# Patient Record
Sex: Female | Born: 1980 | Race: White | Hispanic: No | Marital: Single | Smoking: Never smoker
Health system: Southern US, Community
[De-identification: ages and names within clinical notes are randomized; demographics above are authoritative.]

## PROBLEM LIST (undated history)

## (undated) DIAGNOSIS — E785 Hyperlipidemia, unspecified: Secondary | ICD-10-CM

## (undated) DIAGNOSIS — G039 Meningitis, unspecified: Secondary | ICD-10-CM

## (undated) DIAGNOSIS — R6252 Short stature (child): Secondary | ICD-10-CM

## (undated) DIAGNOSIS — J302 Other seasonal allergic rhinitis: Secondary | ICD-10-CM

## (undated) DIAGNOSIS — M419 Scoliosis, unspecified: Secondary | ICD-10-CM

## (undated) DIAGNOSIS — F411 Generalized anxiety disorder: Secondary | ICD-10-CM

## (undated) DIAGNOSIS — N910 Primary amenorrhea: Secondary | ICD-10-CM

## (undated) DIAGNOSIS — E039 Hypothyroidism, unspecified: Secondary | ICD-10-CM

## (undated) DIAGNOSIS — I639 Cerebral infarction, unspecified: Secondary | ICD-10-CM

## (undated) DIAGNOSIS — Q2381 Bicuspid aortic valve: Secondary | ICD-10-CM

## (undated) DIAGNOSIS — T8859XA Other complications of anesthesia, initial encounter: Secondary | ICD-10-CM

## (undated) DIAGNOSIS — Z9889 Other specified postprocedural states: Secondary | ICD-10-CM

## (undated) DIAGNOSIS — Q969 Turner's syndrome, unspecified: Secondary | ICD-10-CM

## (undated) DIAGNOSIS — R112 Nausea with vomiting, unspecified: Secondary | ICD-10-CM

## (undated) DIAGNOSIS — I10 Essential (primary) hypertension: Secondary | ICD-10-CM

## (undated) DIAGNOSIS — T4145XA Adverse effect of unspecified anesthetic, initial encounter: Secondary | ICD-10-CM

## (undated) DIAGNOSIS — Q231 Congenital insufficiency of aortic valve: Secondary | ICD-10-CM

## (undated) HISTORY — DX: Hypothyroidism, unspecified: E03.9

## (undated) HISTORY — DX: Generalized anxiety disorder: F41.1

## (undated) HISTORY — DX: Bicuspid aortic valve: Q23.81

## (undated) HISTORY — DX: Primary amenorrhea: N91.0

## (undated) HISTORY — DX: Short stature (child): R62.52

## (undated) HISTORY — DX: Other seasonal allergic rhinitis: J30.2

## (undated) HISTORY — PX: MANDIBLE FRACTURE SURGERY: SHX706

## (undated) HISTORY — DX: Essential (primary) hypertension: I10

## (undated) HISTORY — PX: NASAL SINUS SURGERY: SHX719

## (undated) HISTORY — DX: Hyperlipidemia, unspecified: E78.5

## (undated) HISTORY — DX: Turner's syndrome, unspecified: Q96.9

## (undated) HISTORY — DX: Scoliosis, unspecified: M41.9

## (undated) HISTORY — PX: OTHER SURGICAL HISTORY: SHX169

## (undated) HISTORY — DX: Congenital insufficiency of aortic valve: Q23.1

---
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
## 2001-06-11 ENCOUNTER — Other Ambulatory Visit: Admission: RE | Admit: 2001-06-11 | Discharge: 2001-06-11 | Payer: Self-pay | Admitting: Obstetrics and Gynecology

## 2002-01-17 ENCOUNTER — Inpatient Hospital Stay (HOSPITAL_COMMUNITY): Admission: RE | Admit: 2002-01-17 | Discharge: 2002-01-19 | Payer: Self-pay | Admitting: *Deleted

## 2005-03-10 ENCOUNTER — Observation Stay (HOSPITAL_COMMUNITY): Admission: AD | Admit: 2005-03-10 | Discharge: 2005-03-11 | Payer: Self-pay | Admitting: Obstetrics and Gynecology

## 2006-11-11 ENCOUNTER — Observation Stay (HOSPITAL_COMMUNITY): Admission: AD | Admit: 2006-11-11 | Discharge: 2006-11-12 | Payer: Self-pay | Admitting: Family Medicine

## 2006-11-16 ENCOUNTER — Inpatient Hospital Stay (HOSPITAL_COMMUNITY): Admission: AD | Admit: 2006-11-16 | Discharge: 2006-11-17 | Payer: Self-pay | Admitting: Obstetrics

## 2006-11-18 ENCOUNTER — Ambulatory Visit: Payer: Self-pay | Admitting: Hematology and Oncology

## 2006-11-24 LAB — URINALYSIS, MICROSCOPIC - CHCC
Bilirubin (Urine): NEGATIVE
Blood: NEGATIVE
Glucose: NEGATIVE g/dL
Ketones: NEGATIVE mg/dL
Leukocyte Esterase: NEGATIVE
Specific Gravity, Urine: 1.01 (ref 1.003–1.035)

## 2006-11-24 LAB — CBC & DIFF AND RETIC
EOS%: 0.5 % (ref 0.0–7.0)
HCT: 34.9 % (ref 34.8–46.6)
IRF: 0.19 (ref 0.130–0.330)
LYMPH%: 11.5 % — ABNORMAL LOW (ref 14.0–48.0)
MCH: 28.4 pg (ref 26.0–34.0)
MCHC: 34.7 g/dL (ref 32.0–36.0)
MCV: 81.9 fL (ref 81.0–101.0)
MONO#: 0.6 10*3/uL (ref 0.1–0.9)
MONO%: 7.5 % (ref 0.0–13.0)
NEUT%: 80.1 % — ABNORMAL HIGH (ref 39.6–76.8)
Platelets: 186 10*3/uL (ref 145–400)
RDW: 15.7 % — ABNORMAL HIGH (ref 11.3–14.5)

## 2006-11-24 LAB — IVY BLEEDING TIME

## 2006-11-28 LAB — IRON AND TIBC: UIBC: 291 ug/dL

## 2006-11-28 LAB — VON WILLEBRAND FACTOR MULTIMER
Factor-VIII Activity: 106 % (ref 75–150)
Von Willebrand Ag: 121 % normal (ref 61–164)

## 2006-11-28 LAB — PROTEIN ELECTROPHORESIS, SERUM
Albumin ELP: 55.8 % (ref 55.8–66.1)
Beta 2: 4.9 % (ref 3.2–6.5)
Beta Globulin: 8.1 % — ABNORMAL HIGH (ref 4.7–7.2)

## 2006-11-28 LAB — FOLATE RBC: RBC Folate: 921 ng/mL — ABNORMAL HIGH (ref 180–600)

## 2006-11-28 LAB — COMPREHENSIVE METABOLIC PANEL
Albumin: 4 g/dL (ref 3.5–5.2)
Alkaline Phosphatase: 81 U/L (ref 39–117)
BUN: 13 mg/dL (ref 6–23)
Calcium: 9.1 mg/dL (ref 8.4–10.5)
Glucose, Bld: 74 mg/dL (ref 70–99)

## 2006-11-28 LAB — LACTATE DEHYDROGENASE: LDH: 129 U/L (ref 94–250)

## 2006-11-28 LAB — PROTHROMBIN TIME: Prothrombin Time: 13.4 seconds (ref 11.6–15.2)

## 2006-12-15 LAB — CBC WITH DIFFERENTIAL/PLATELET
HCT: 24.9 % — ABNORMAL LOW (ref 34.8–46.6)
HGB: 8.6 g/dL — ABNORMAL LOW (ref 11.6–15.9)
MCH: 28.8 pg (ref 26.0–34.0)
MCV: 83.7 fL (ref 81.0–101.0)
MONO#: 0.7 10*3/uL (ref 0.1–0.9)
NEUT#: 4.2 10*3/uL (ref 1.5–6.5)
NEUT%: 69.8 % (ref 39.6–76.8)
Platelets: 220 10*3/uL (ref 145–400)
RBC: 2.98 10*6/uL — ABNORMAL LOW (ref 3.70–5.32)
RDW: 16 % — ABNORMAL HIGH (ref 11.3–14.5)
WBC: 5.9 10*3/uL (ref 3.9–10.0)
lymph#: 1 10*3/uL (ref 0.9–3.3)

## 2006-12-15 LAB — FERRITIN: Ferritin: 19 ng/mL (ref 10–291)

## 2007-01-15 ENCOUNTER — Ambulatory Visit: Payer: Self-pay | Admitting: Hematology and Oncology

## 2007-01-19 LAB — BASIC METABOLIC PANEL
BUN: 13 mg/dL (ref 6–23)
CO2: 26 mEq/L (ref 19–32)
Calcium: 9.2 mg/dL (ref 8.4–10.5)
Chloride: 103 mEq/L (ref 96–112)
Glucose, Bld: 77 mg/dL (ref 70–99)
Potassium: 4.8 mEq/L (ref 3.5–5.3)

## 2007-01-19 LAB — CBC WITH DIFFERENTIAL/PLATELET
BASO%: 1.2 % (ref 0.0–2.0)
Basophils Absolute: 0.1 10*3/uL (ref 0.0–0.1)
MCHC: 33.6 g/dL (ref 32.0–36.0)
MCV: 82.1 fL (ref 81.0–101.0)
MONO%: 10.6 % (ref 0.0–13.0)
Platelets: 260 10*3/uL (ref 145–400)
WBC: 5.1 10*3/uL (ref 3.9–10.0)
lymph#: 1.3 10*3/uL (ref 0.9–3.3)

## 2007-01-19 LAB — FERRITIN: Ferritin: 14 ng/mL (ref 10–291)

## 2007-04-14 ENCOUNTER — Ambulatory Visit: Payer: Self-pay | Admitting: Hematology and Oncology

## 2010-02-10 ENCOUNTER — Encounter: Payer: Self-pay | Admitting: Obstetrics and Gynecology

## 2010-06-04 NOTE — Consult Note (Signed)
Emma Martin, Emma Martin             ACCOUNT NO.:  [id number]   MEDICAL RECORD NO.:  [id number]          PATIENT TYPE:  OBV   LOCATION:  9308                          FACILITY:  [facility]   PHYSICIAN:  Lenoard Aden, M.D.DATE OF BIRTH:  13-Mar-1980   DATE OF CONSULTATION:  11/11/2006  DATE OF DISCHARGE:  11/12/2006                                 CONSULTATION   ER CONSULT:   CHIEF COMPLAINT:  Heavy bleeding.   She is a 30 year old white female with known premature menopause, known  Turner syndrome symptoms with heavy bleeding over the past week.  She  was seen multiple times for orthostasis in the office.  Pulse of 120 and  hemoglobin of 7.4 in the office.   ALLERGIES:  __________   MEDICATIONS:  To include:  1. Prometrium.  2. Vivelle Dot.  3. Slow Fe.   She is a nonsmoker, nondrinker.  She denies domestic or physical  violence.   PAST MEDICAL HISTORY:  1. She does have a history of hypertension.  2. Breast cancer.  3. She has a history of anemia as noted.  4. Scoliosis.  5. Various plastic surgeries.  6. Ear tubes.  7. Turner's syndrome as noted.  8. She is single and has never been pregnant.   PHYSICAL EXAMINATION:  VITAL SIGNS:  Blood pressure ranging from 110 to  130 over 70s to 80s.  Pulse 84-120.  Respirations 18.  GENERAL:  She is a well-developed, well-nourished white female in no  acute distress.  HEENT:  Normal.  LUNGS:  Clear to auscultation.  NECK:  Supple.  Full range of motion.  HEART:  Regular rhythm.  ABDOMEN:  Soft.  Nontender to palpation.  Normal bowel sounds noted.  GU:  Visual exam and pelvic exam reveal no evidence of acute vaginal  bleeding at this time.  EXTREMITIES:  Show no cords.  NEUROLOGIC:  Exam is nonfocal.  SKIN:  Intact.   CBC is pending.  Patient is cross-matched for 2 units of packed red  blood cells.   IMPRESSION:  Menorrhagia of unknown etiology with questionable workup  begun at [facility] not available at this time.  She is  currently  clinically stable but due to profound anemia and consistently heavy  bleeds she will undergo transfusion at this time.   PLAN:  Proceed with transfusion of 2 units of packed cells and follow up  for post transfusion hemoglobin, and probable discharge home.      Lenoard Aden, M.D.  Electronically Signed     RJT/MEDQ  D:  11/11/2006  T:  11/12/2006  Job:  [id number]

## 2010-06-07 NOTE — H&P (Signed)
Emma Martin, Emma Martin             ACCOUNT NO.:  [id number]   MEDICAL RECORD NO.:  [id number]          PATIENT TYPE:  INP   LOCATION:  9320                          FACILITY:  [facility]   PHYSICIAN:  Lenoard Aden, M.D.DATE OF BIRTH:  04-10-1980   DATE OF ADMISSION:  03/10/2005  DATE OF DISCHARGE:                                HISTORY & PHYSICAL   CHIEF COMPLAINT:  Bleeding.   She is a 30 year old white female, G0, P0, with known premature menopause,  known Turner's syndrome, who presents with heavy bleeding for the last week.  She was seen with minimal signs of orthostasis in the [facility] emergency  room and a hemoglobin of 7.7.   She has no known drug allergies.   MEDICATIONS:  Zyrtec, Provera, estradiol patches.   FAMILY HISTORY:  Otherwise noncontributory.   PAST SURGICAL HISTORY:  Remarkable for ear surgery x2 with plastic surgery  as well secondary to Turner's stigmata and also jaw surgery.   She denies domestic or physical violence as noted.   PHYSICAL EXAMINATION:  GENERAL:  She is a well-developed, well-nourished  white female in no acute distress.  VITAL SIGNS:  Blood pressure 110-120/74-81, pulse 84-110, respirations 18.  LUNGS:  Clear.  CARDIAC:  Regular rhythm.  ABDOMEN:  Soft, nontender to palpation.  Normal bowel sounds noted.  PELVIC:  Visual exam reveals no evidence of any vaginal bleeding.  EXTREMITIES:  No cords.  NEUROLOGIC:  Nonfocal.   The patient is status post two units of blood, for which she is currently  with a hemoglobin of 9.5, pending ultrasound at this time.   IMPRESSION:  Menorrhagia of unknown etiology y with secondary anemia, status  post transfusion, asymptomatic at this time.   PLAN:  Proceed with pelvic ultrasound and discharge home.  Will discharge on  Premarin 2.5 mg p.o. q.i.d. x21 days, then add Provera 10 mg a day for 10  days.  Follow up for workup pending ultrasound.  Will give Zofran on  discharge for nausea.      Lenoard Aden, M.D.  Electronically Signed     RJT/MEDQ  D:  03/11/2005  T:  03/11/2005  Job:  [id number]

## 2010-06-07 NOTE — Op Note (Signed)
Emma Martin, Emma Martin                       ACCOUNT NO.:  [id number]   MEDICAL RECORD NO.:  [id number]                   PATIENT TYPE:  INP   LOCATION:  0466                                 FACILITY:  [facility]   PHYSICIAN:  Saddie Benders, D.D.S.         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   SURGEON:  Saddie Benders, D.D.S. and Emma Martin, D.D.S.   PROCEDURE:  Three piece total maxillary osteotomy and mandibular sagittal  split osteotomy with rigid osseous fixation.   DESCRIPTION OF PROCEDURE:  On January 17, 2002, Emma Martin presented  herself to the operating room of [facility] where after obtaining  a proper level of anesthesia with the use of nasotracheal intubation a 2  inch wet vaginal gauze packing was placed in the oropharynx and the patient  then received an intraoral and extraoral Betadine prep. Sterile drapes were  used to isolate the surgical field and attention was then drawn to the oral  cavity where it was thoroughly irrigated and suctioned. Then 0.5% Marcaine  with 1:200,000 epinephrine was used for local anesthesia to the areas of the  right and left ramus regions of the mandible. Attention was then directed to  the mandibular right ramus region where the cutting Bovie was used to make a  mucoperiosteal incision overlying the right ascending border of the ramus  down to a point lateral to the mandibular right second molar. The periosteal  elevator was then used to reflect the mucoperiosteal flap and the anterior  border stripper was then used to strip the periosteum  overlying the  ascending border of the ramus up to a point just inferior to the tip of the  coronoid process. At this point, the self retaining coronoid retractor was  placed into position and tissues were reflected on the lingual aspect of the  mandible and the lingula was positively identified. At this time using the  striker drill with a Lindeman bur, a medial horizontal osteotomy cut was  then made only through the lingual cortex of bone into the medullary bone  and this cut extended from the ascending border of the ramus approximately  2/3 of the way to the posterior border. At this time, the same striker drill  with a #701 bur was used to make small bur holes from the most anterior  aspect of the medial horizontal osteotomy cut down to a point lateral to the  mandibular right second molar. These small bur holes were then connected and  at this point the channel retractor was placed in the mandibular notch  region and the lateral vertical osteotomy cut was  made through both cortices  at the inferior border and then only through the lateral cortex of bone up  to a point to join the connecting cut. The area was irrigated and a half  pack was placed and the same procedure was now completed on the left side of  the mandible and again a half pack placed. After applying local anesthesia  to the maxillary vestibule, attention was now drawn to complete the  maxillary osteotomy and at first a circumvestibular incision was carried out  from the maxillary right first molar region to the maxillary left first  molar region high in the vestibule. This was completed with the Bovie and  then the periosteal elevator was used to reflect the mucoperiosteum  superiorly to expose the lateral aspect of the maxilla. In the area of the  nose and piriform rim, the soft tissues of the nose were freed from the bony  housing utilizing a periosteal elevator and a curved freer. This included  the contents of the  nasal tissues from the floor of the nose. At this time,  vertical reference points were placed in the right and left zygomatic  buttress region and right and left piriform rim regions 10 mm apart. Using  the striker drill with the #702 bur, a horizontal cut was made through the  maxilla from the zygomatic buttress region anteriorly to the right piriform  rim region. At this point, the striker reciprocating saw was placed into the  right maxillary sinus and a cut was made from the right zygomatic buttress  region back to the pterygoid plate region by cutting from the inside of the  sinus to the outside. At this time, the same cuts were now completed on the  left side of the maxilla. The nasal septal osteotome was now used to  separate the nasal crest of the maxilla from the vomer and then while  protecting the soft tissues of the nose the spatula osteotome was used to  separate the lateral wall of the  nose. This was completed on both sides.  Now the pterygomaxillary osteotome was placed between the posterior wall of  the maxilla on the pterygoid plates on the right side, osteotome through to  separate the posterior walls of the maxilla from the pterygoid plate and  then the same procedure was completed on the left side. Applying digital  pressure to the right and left maxillary cuspid regions, the maxillary was  now down fractured and the soft tissues were dissected away from the floor  of the nose as this was completed. The rongeurs were then used to remove any  bony interferences in the area of the right and left maxillary sinuses and  also the striker drill was used to remove any sharp bony prominences along  the nasal crest of the maxilla. The Tessier disimpaction forceps were now  placed on the right and left posterior wall of the maxilla and pressure was  applied anteriorly to completely free the maxilla from its superior bony  areas. Once this was completed, a small amount of bone was  removed along the right and left lateral aspects of the sinuses in the posterior region and  now the maxilla was segmentalized into three dental alveolar segments. This  was first completed by making a horse shoe area of the palatal bone with  cuffs in a parasagittal fashion on the right and left sides and then a  connecting cut joined anteriorly. Once this horse shoed piece of  bone was  freed, attention was directed to segmentalize the maxilla distal to the  right and left cuspid regions. This was achieved by first reflecting the  tissue overlying the lateral aspect of the maxilla with the periosteal  elevator and then using a #701 bur to make small bur holes between the  maxillary cuspid and maxillary bicuspid teeth. A small osteotome was then  used to place between the cuspid and bicuspid teeth and mouth it through to  the palatal aspect with care. These cuts were completed without  complications and now the maxilla was segmentalized into three dental  alveolar segments. A previously constructed intermediate surgical splint was  now used and the three dental alveolar segments were now wired into the  splint using 28 gauge stainless steel wires. Once this was completed,  intermaxillary fixation was achieved utilizing 26 gauge stainless steel wire  loops and the maxilla allowed to auto rotate superiorly. There was a small  bony interference on one lateral aspect of the maxilla on the left side and  once this was removed with the striker drill the maxilla and mandible could  auto rotate without interference. In order to superiorly reposition the  maxilla 1 mm, a small amount of bone on the left side was removed and the  measurements confirmed that the maxilla had been superiorly repositioned 1  mm on each side. In addition, the maxilla had been rotated 1 mm to the left  since in the previously completed exams it was found that the maxilla dental  midline was deviated 1 mm to the right.  Before applying bone plates, the  nasal septum was reduced approximately 3 mm in anterior aspect so that when  the maxilla was repositioned superiorly it would not cause any buckling of  the nasal septum. Once this was completed, the soft tissue contents of the  nose were thoroughly irrigated and suctioned and the nasal tissues were  reapproximated utilizing #4-0 gut  suture in a continuous manner. The entire  maxillary osteotomy was now thoroughly irrigated and suctioned and once the  maxilla was auto rotated superiorly bone plates were placed on the right and  left zygomatic buttress region and right and left piriform regions. Now that  the maxillary osteotomy was complete, the intermaxillary wires were cut and  it was found that the mandible would auto rotate into the splint securely.  After this the intermediate splint was removed and the final splint was now  wired to the maxillary teeth utilizing #26 gauge stainless steel wire.  Attention was now directed to the mandibular right ramus region where utilizing increasing sizes of osteotomes, the mandible was split in a  sagittal fashion.  As the split was occurring, it was noted that the  contents of the inferior mandibular canal were positioned very laterally and  the entire contents of the mandibular canal were not 100% intact once the  split was completed. It was noted that there was still approximation in some  areas but definitely the canal was frayed. Once the split was completed, the  pterygomaxillary sling stripper was used to separate any muscular attachment  to the distal segment and a half pack was placed. Attention was now directed  to the left side of the mandible where again utilizing increasing sizes of  osteotomes and the Smith splitting instruments, the mandible was split in a  sagittal fashion. Again the contents of the mandibular canal or inferior  alveolar canal were found to be extremely lateral  and because of this it  was  difficult to dissect that from the overlying bone. This was accomplished but  again the contents of the canal were not entirely intact and were definitely  frayed in one region. Once the mandible was completely freed, it was rotated  into the final surgical splint and intermaxillary fixation was completed  with 26 gauge stainless steel wire loops. Attention was first directed to  the right side of the mandible and due to the extreme amount of rotation the  cortical plates would not align themselves in any parallel fashion that  would allow for normal bone screw fixation. Although this was tried one time  it was not secure and these screws were removed. Therefore a bone plate was  placed across the two cortical plates and monocortical screw fixation was  obtained. Two screws were used in each the proximal and distal segments. It  was felt that this obtained satisfactory alignment but did not obtain the  stability that was normally seen with bone screw fixation. Attention was now  directed to the left side and initially bone screw fixation was obtained  utilizing 2 mm screws but when the IMF wires were cut and the occlusion  evaluated, it was found that it was somewhat low on the left side and was  retruded. Therefore these two screws were removed and bone screw fixation  was again obtained and this time it was felt that the mandible was slightly  retruded from an ideal position but that part of this was due to the  thickness of the splint and the mandible was actually ramping into the  splint causing some interference. Because of the very minimal bone available  for fixation, it was not felt desirable to remove the plate or screws and  attempt to reposition the mandible again because there would be very few  places left in order to obtain fixation. Therefore the mandibular osteotomy  incisions were thoroughly irrigated and suctioned and tissues reapproximated  with #3-0 Vicryl in a  continuous fashion. Attention was then directed to the maxillary incision and first of all an alar cinch technique was utilized to  reapproximate the transverse ______________ muscles with a #2-0 Vicryl and  then a VY closure of the maxilla was completed utilizing #4-0 Vicryl. Once  this was completed, the oral cavity was thoroughly irrigated and suctioned,  the 2 inch wet vaginal gauze packing was removed. Intermaxillary fixation  was then obtained utilizing four #26 gauge stainless steel loops. This  terminated the surgical aspect of this patient's treatment and she was then  transferred to the recovery room in stable condition.   TIME OF PROCEDURE:  6 1/2 hours.   ESTIMATED BLOOD LOSS:  250-300 cc.   ANESTHESIA:  General.   CONDITION:  Stable.   1. As previously noted, there was definite fraying or disturbance of the     inferior alveolar nerve canal on both sides. We will have to wait and see     whether or not the patient obtains complete return of sensation to these     areas.  2. It was noted that rigid bony fixation could not be obtained due to the     position of the two cortical plates on the right and left sides of the     mandible and therefore it is anticipated the patient will remain in     intermaxillary fixation for 6-8 weeks. A Panorex radiograph will be  obtained to determine the position of these segments postoperatively.                                                Saddie Benders, D.D.S.    JWC/MEDQ  D:  01/18/2002  T:  01/18/2002  Job:  [id number]

## 2010-07-30 ENCOUNTER — Other Ambulatory Visit (HOSPITAL_COMMUNITY)
Admission: RE | Admit: 2010-07-30 | Discharge: 2010-07-30 | Disposition: A | Discharge status: Home / Self Care | Payer: Self-pay | Source: Ambulatory Visit | Attending: Family Medicine | Admitting: Family Medicine

## 2010-07-30 ENCOUNTER — Other Ambulatory Visit: Payer: Self-pay | Admitting: Family Medicine

## 2010-07-30 DIAGNOSIS — Z124 Encounter for screening for malignant neoplasm of cervix: Secondary | ICD-10-CM | POA: Insufficient documentation

## 2010-10-30 LAB — CROSSMATCH

## 2010-10-30 LAB — CBC
HCT: 13.8 — ABNORMAL LOW
HCT: 18.5 — ABNORMAL LOW
HCT: 28.8 — ABNORMAL LOW
HCT: 32.8 — ABNORMAL LOW
Hemoglobin: 11.3 — ABNORMAL LOW
Hemoglobin: 6.4 — CL
Hemoglobin: 9.9 — ABNORMAL LOW
MCHC: 33.4
MCHC: 34.2
MCHC: 34.5
MCV: 82.3
MCV: 82.4
MCV: 83.3
Platelets: 195
Platelets: 298
RBC: 3.46 — ABNORMAL LOW
RDW: 15.3 — ABNORMAL HIGH
RDW: 15.6 — ABNORMAL HIGH
WBC: 11.8 — ABNORMAL HIGH
WBC: 16.2 — ABNORMAL HIGH
WBC: 8.2

## 2011-02-26 ENCOUNTER — Other Ambulatory Visit: Payer: Self-pay | Admitting: Cardiology

## 2011-02-26 DIAGNOSIS — Q231 Congenital insufficiency of aortic valve: Secondary | ICD-10-CM

## 2011-02-26 DIAGNOSIS — Q969 Turner's syndrome, unspecified: Secondary | ICD-10-CM

## 2011-03-05 ENCOUNTER — Ambulatory Visit
Admission: RE | Admit: 2011-03-05 | Discharge: 2011-03-05 | Disposition: A | Discharge status: Home / Self Care | Payer: PRIVATE HEALTH INSURANCE | Source: Ambulatory Visit | Attending: Cardiology | Admitting: Cardiology

## 2011-03-05 ENCOUNTER — Ambulatory Visit (HOSPITAL_COMMUNITY)
Admission: RE | Admit: 2011-03-05 | Discharge: 2011-03-05 | Disposition: A | Discharge status: Home / Self Care | Payer: PRIVATE HEALTH INSURANCE | Source: Ambulatory Visit | Attending: Cardiology | Admitting: Cardiology

## 2011-03-05 DIAGNOSIS — I10 Essential (primary) hypertension: Secondary | ICD-10-CM | POA: Insufficient documentation

## 2011-03-05 DIAGNOSIS — Q969 Turner's syndrome, unspecified: Secondary | ICD-10-CM

## 2011-03-05 DIAGNOSIS — Q231 Congenital insufficiency of aortic valve: Secondary | ICD-10-CM

## 2011-03-05 MED ORDER — IOHEXOL 300 MG/ML  SOLN
100.0000 mL | Freq: Once | INTRAMUSCULAR | Status: AC | PRN
  Administered 2011-03-05: 100 mL via INTRAVENOUS

## 2011-03-05 NOTE — Progress Notes (Signed)
*  PRELIMINARY RESULTS* Echocardiogram 2D Echocardiogram has been performed.  Glean Salen [facility] 03/05/2011, 3:43 PM

## 2013-03-09 ENCOUNTER — Encounter: Payer: Self-pay | Admitting: Cardiology

## 2013-03-10 ENCOUNTER — Ambulatory Visit: Payer: PRIVATE HEALTH INSURANCE | Admitting: Cardiology

## 2013-03-18 ENCOUNTER — Ambulatory Visit: Payer: PRIVATE HEALTH INSURANCE | Admitting: Cardiology

## 2013-04-06 ENCOUNTER — Encounter (INDEPENDENT_AMBULATORY_CARE_PROVIDER_SITE_OTHER): Payer: Self-pay

## 2013-04-06 ENCOUNTER — Encounter: Payer: Self-pay | Admitting: Cardiology

## 2013-04-06 ENCOUNTER — Ambulatory Visit (INDEPENDENT_AMBULATORY_CARE_PROVIDER_SITE_OTHER): Payer: BC Managed Care – PPO | Admitting: Cardiology

## 2013-04-06 VITALS — BP 110/78 | HR 88 | Ht 61.0 in | Wt 115.0 lb

## 2013-04-06 DIAGNOSIS — I10 Essential (primary) hypertension: Secondary | ICD-10-CM | POA: Insufficient documentation

## 2013-04-06 DIAGNOSIS — Q231 Congenital insufficiency of aortic valve: Secondary | ICD-10-CM | POA: Insufficient documentation

## 2013-04-06 NOTE — Progress Notes (Signed)
[facility] [address] [place], [place]  [address] Phone: [phone] Fax:  [phone]  Date:  04/06/2013   ID:  INDIGO BARBIAN, DOB 14-Feb-1980, MRN [id number]  PCP:  Jonathon Bellows, MD   History of Present Illness: Emma Martin is a 33 y.o. female with bicuspid aortic valve, Turner syndrome here for annual followup. CT scan showed no evidence of coarctation of the aorta. Nonsmoker. Asymptomatic. oophorectomy. No chest pain, no shortness of breath, no syncope, no bleeding, no joint pain. She does have Harrington rods in her back for scoliosis. She has previously been evaluated at [facility] Readings from Last 3 Encounters:  04/06/13 115 lb (52.164 kg)     Past Medical History  Diagnosis Date  . Turner syndrome     , previously followed by pediatric endocrinologist Freddy Jaksch, MD at [facility] through her early 56s).  . Mixed gonadal dysgenesis     , (additional Y-bearing cell line) for which she reportedly underwent bilateral gonadectomy (patient unaware; needs confirmation) due to malignancy potential.  . Bicuspid aortic valve     , mild aortic insufficiency. (No SBE prophylaxis needed)  . Short stature     , previously treated with growth hormone age 31-15 years.  . Scoliosis     , treated with Harrington rods (1997) for stabilization.  . Primary amenorrhea     , treated starting at age 25 or 42 with estrogen and progesterone  . Hypothyroidism   . Hypertension   . Dyslipidemia   . Seasonal allergic rhinitis   . Generalized anxiety disorder     , with obsessive-compulsive traits.    No past surgical history on file.  Current Outpatient Prescriptions  Medication Sig Dispense Refill  . fluticasone (FLONASE) 50 MCG/ACT nasal spray Place 2 sprays into both nostrils daily.      Marland Kitchen levothyroxine (SYNTHROID, LEVOTHROID) 75 MCG tablet Take 75 mcg by mouth daily before breakfast.      . lisinopril-hydrochlorothiazide  (PRINZIDE,ZESTORETIC) 10-12.5 MG per tablet Take 1 tablet by mouth daily.      . Multiple Vitamins-Minerals (CENTRUM PO) Take 1 tablet by mouth daily.      . norethindrone-ethinyl estradiol-iron (MICROGESTIN FE,GILDESS FE,LOESTRIN FE) 1.5-30 MG-MCG tablet Take 1 tablet by mouth daily.      . sertraline (ZOLOFT) 100 MG tablet Take 100 mg by mouth daily.       No current facility-administered medications for this visit.    Allergies:    Allergies  Allergen Reactions  . Benzoin Rash    Social History:  The patient  reports that she has never smoked. She does not have any smokeless tobacco history on file. She reports that she drinks alcohol. She reports that she does not use illicit drugs.   ROS:  Please see the history of present illness.   Denies any syncope, bleeding, orthopnea, PND, shortness of breath    PHYSICAL EXAM: VS:  BP 110/78  Pulse 88  Ht 5\' 1"  (1.549 m)  Wt 115 lb (52.164 kg)  BMI 21.74 kg/m2 Well nourished, well developed, in no acute distress HEENT: neck webbing Neck: no JVD Cardiac:  normal S1, S2; RRR; no murmur Lungs:  clear to auscultation bilaterally, no wheezing, rhonchi or rales Abd: soft, nontender, no hepatomegaly Ext: no edema Skin: warm and dry Neuro: no focal abnormalities noted  EKG:  Sinus rhythm, 88, nonspecific T-wave changes, T wave inversion in the  inferior/anterior/lateral precordial leads. Consider ischemia. No change from prior EKG 2/14.     ASSESSMENT AND PLAN:  1. Turner's syndrome/bicuspid aortic valve-no significant murmur heard. Prior echocardiogram showed mild aortic regurgitation. No symptoms. We will continue to monitor clinically. I've informed her of the potential for aortic valve replacement in the future. 2. Hypertension-well-controlled. 3. Annual followup.  Signed, Candee Furbish, MD [facility]  04/06/2013 9:16 AM

## 2013-04-06 NOTE — Patient Instructions (Signed)
Your physician recommends that you continue on your current medications as directed. Please refer to the Current Medication list given to you today.  Your physician wants you to follow-up in: 1 year with Dr. Skains. You will receive a reminder letter in the mail two months in advance. If you don't receive a letter, please call our office to schedule the follow-up appointment.  

## 2013-08-22 ENCOUNTER — Other Ambulatory Visit: Payer: Self-pay | Admitting: Family Medicine

## 2013-08-22 ENCOUNTER — Other Ambulatory Visit (HOSPITAL_COMMUNITY)
Admission: RE | Admit: 2013-08-22 | Discharge: 2013-08-22 | Disposition: A | Discharge status: Home / Self Care | Payer: BC Managed Care – PPO | Source: Ambulatory Visit | Attending: Family Medicine | Admitting: Family Medicine

## 2013-08-22 DIAGNOSIS — Z1151 Encounter for screening for human papillomavirus (HPV): Secondary | ICD-10-CM | POA: Insufficient documentation

## 2013-08-22 DIAGNOSIS — Z124 Encounter for screening for malignant neoplasm of cervix: Secondary | ICD-10-CM | POA: Insufficient documentation

## 2013-08-24 LAB — CYTOLOGY - PAP

## 2015-09-04 DIAGNOSIS — Z0000 Encounter for general adult medical examination without abnormal findings: Secondary | ICD-10-CM | POA: Diagnosis not present

## 2015-09-04 DIAGNOSIS — E785 Hyperlipidemia, unspecified: Secondary | ICD-10-CM | POA: Diagnosis not present

## 2015-09-04 DIAGNOSIS — E039 Hypothyroidism, unspecified: Secondary | ICD-10-CM | POA: Diagnosis not present

## 2016-05-01 DIAGNOSIS — F411 Generalized anxiety disorder: Secondary | ICD-10-CM | POA: Diagnosis not present

## 2016-08-03 DIAGNOSIS — S29092A Other injury of muscle and tendon of back wall of thorax, initial encounter: Secondary | ICD-10-CM | POA: Diagnosis not present

## 2016-08-03 DIAGNOSIS — S0003XA Contusion of scalp, initial encounter: Secondary | ICD-10-CM | POA: Diagnosis not present

## 2016-08-03 DIAGNOSIS — S20211A Contusion of right front wall of thorax, initial encounter: Secondary | ICD-10-CM | POA: Diagnosis not present

## 2016-08-27 ENCOUNTER — Ambulatory Visit
Admission: RE | Admit: 2016-08-27 | Discharge: 2016-08-27 | Disposition: A | Discharge status: Home / Self Care | Payer: BLUE CROSS/BLUE SHIELD | Source: Ambulatory Visit | Attending: Family Medicine | Admitting: Family Medicine

## 2016-08-27 ENCOUNTER — Other Ambulatory Visit: Payer: Self-pay | Admitting: Family Medicine

## 2016-08-27 DIAGNOSIS — Z87828 Personal history of other (healed) physical injury and trauma: Secondary | ICD-10-CM

## 2016-08-27 DIAGNOSIS — R0781 Pleurodynia: Secondary | ICD-10-CM | POA: Diagnosis not present

## 2016-08-27 DIAGNOSIS — S299XXA Unspecified injury of thorax, initial encounter: Secondary | ICD-10-CM | POA: Diagnosis not present

## 2016-09-15 DIAGNOSIS — E785 Hyperlipidemia, unspecified: Secondary | ICD-10-CM | POA: Diagnosis not present

## 2016-09-15 DIAGNOSIS — E039 Hypothyroidism, unspecified: Secondary | ICD-10-CM | POA: Diagnosis not present

## 2016-09-15 DIAGNOSIS — I10 Essential (primary) hypertension: Secondary | ICD-10-CM | POA: Diagnosis not present

## 2016-09-15 DIAGNOSIS — Z0000 Encounter for general adult medical examination without abnormal findings: Secondary | ICD-10-CM | POA: Diagnosis not present

## 2017-02-26 DIAGNOSIS — F411 Generalized anxiety disorder: Secondary | ICD-10-CM | POA: Diagnosis not present

## 2017-03-03 DIAGNOSIS — F419 Anxiety disorder, unspecified: Secondary | ICD-10-CM | POA: Diagnosis not present

## 2017-03-22 DIAGNOSIS — F411 Generalized anxiety disorder: Secondary | ICD-10-CM | POA: Diagnosis not present

## 2017-03-31 DIAGNOSIS — F411 Generalized anxiety disorder: Secondary | ICD-10-CM | POA: Diagnosis not present

## 2017-04-13 DIAGNOSIS — F411 Generalized anxiety disorder: Secondary | ICD-10-CM | POA: Diagnosis not present

## 2017-05-05 DIAGNOSIS — F411 Generalized anxiety disorder: Secondary | ICD-10-CM | POA: Diagnosis not present

## 2017-06-08 DIAGNOSIS — F411 Generalized anxiety disorder: Secondary | ICD-10-CM | POA: Diagnosis not present

## 2017-07-06 DIAGNOSIS — F411 Generalized anxiety disorder: Secondary | ICD-10-CM | POA: Diagnosis not present

## 2017-07-28 DIAGNOSIS — F411 Generalized anxiety disorder: Secondary | ICD-10-CM | POA: Diagnosis not present

## 2017-08-24 DIAGNOSIS — F411 Generalized anxiety disorder: Secondary | ICD-10-CM | POA: Diagnosis not present

## 2017-10-19 DIAGNOSIS — F411 Generalized anxiety disorder: Secondary | ICD-10-CM | POA: Diagnosis not present

## 2017-11-24 DIAGNOSIS — R51 Headache: Secondary | ICD-10-CM | POA: Diagnosis not present

## 2017-11-24 DIAGNOSIS — Z23 Encounter for immunization: Secondary | ICD-10-CM | POA: Diagnosis not present

## 2017-11-24 DIAGNOSIS — Z0000 Encounter for general adult medical examination without abnormal findings: Secondary | ICD-10-CM | POA: Diagnosis not present

## 2017-11-24 DIAGNOSIS — E785 Hyperlipidemia, unspecified: Secondary | ICD-10-CM | POA: Diagnosis not present

## 2017-11-24 DIAGNOSIS — E039 Hypothyroidism, unspecified: Secondary | ICD-10-CM | POA: Diagnosis not present

## 2017-12-23 DIAGNOSIS — F411 Generalized anxiety disorder: Secondary | ICD-10-CM | POA: Diagnosis not present

## 2018-01-14 DIAGNOSIS — H8110 Benign paroxysmal vertigo, unspecified ear: Secondary | ICD-10-CM | POA: Diagnosis not present

## 2018-01-15 ENCOUNTER — Inpatient Hospital Stay (HOSPITAL_COMMUNITY)
Admission: EM | Admit: 2018-01-15 | Discharge: 2018-01-20 | DRG: 871 | Disposition: A | Discharge status: Home Health | Payer: Medicaid Other | Attending: Internal Medicine | Admitting: Internal Medicine

## 2018-01-15 ENCOUNTER — Emergency Department (HOSPITAL_COMMUNITY): Payer: Medicaid Other

## 2018-01-15 ENCOUNTER — Inpatient Hospital Stay (HOSPITAL_COMMUNITY): Payer: Medicaid Other

## 2018-01-15 ENCOUNTER — Other Ambulatory Visit: Payer: Self-pay

## 2018-01-15 ENCOUNTER — Encounter (HOSPITAL_COMMUNITY): Payer: Self-pay | Admitting: *Deleted

## 2018-01-15 DIAGNOSIS — M419 Scoliosis, unspecified: Secondary | ICD-10-CM | POA: Diagnosis not present

## 2018-01-15 DIAGNOSIS — G001 Pneumococcal meningitis: Secondary | ICD-10-CM | POA: Diagnosis not present

## 2018-01-15 DIAGNOSIS — Z79899 Other long term (current) drug therapy: Secondary | ICD-10-CM | POA: Diagnosis not present

## 2018-01-15 DIAGNOSIS — D6959 Other secondary thrombocytopenia: Secondary | ICD-10-CM | POA: Diagnosis present

## 2018-01-15 DIAGNOSIS — Z7951 Long term (current) use of inhaled steroids: Secondary | ICD-10-CM

## 2018-01-15 DIAGNOSIS — Z79890 Hormone replacement therapy (postmenopausal): Secondary | ICD-10-CM | POA: Diagnosis not present

## 2018-01-15 DIAGNOSIS — S3991XA Unspecified injury of abdomen, initial encounter: Secondary | ICD-10-CM | POA: Diagnosis not present

## 2018-01-15 DIAGNOSIS — N2889 Other specified disorders of kidney and ureter: Secondary | ICD-10-CM | POA: Diagnosis not present

## 2018-01-15 DIAGNOSIS — R1011 Right upper quadrant pain: Secondary | ICD-10-CM | POA: Diagnosis not present

## 2018-01-15 DIAGNOSIS — A403 Sepsis due to Streptococcus pneumoniae: Principal | ICD-10-CM | POA: Diagnosis present

## 2018-01-15 DIAGNOSIS — R42 Dizziness and giddiness: Secondary | ICD-10-CM | POA: Diagnosis not present

## 2018-01-15 DIAGNOSIS — R109 Unspecified abdominal pain: Secondary | ICD-10-CM | POA: Insufficient documentation

## 2018-01-15 DIAGNOSIS — B953 Streptococcus pneumoniae as the cause of diseases classified elsewhere: Secondary | ICD-10-CM | POA: Diagnosis not present

## 2018-01-15 DIAGNOSIS — H9190 Unspecified hearing loss, unspecified ear: Secondary | ICD-10-CM | POA: Diagnosis not present

## 2018-01-15 DIAGNOSIS — E871 Hypo-osmolality and hyponatremia: Secondary | ICD-10-CM | POA: Diagnosis not present

## 2018-01-15 DIAGNOSIS — R6520 Severe sepsis without septic shock: Secondary | ICD-10-CM | POA: Diagnosis not present

## 2018-01-15 DIAGNOSIS — R291 Meningismus: Secondary | ICD-10-CM

## 2018-01-15 DIAGNOSIS — R7881 Bacteremia: Secondary | ICD-10-CM

## 2018-01-15 DIAGNOSIS — I10 Essential (primary) hypertension: Secondary | ICD-10-CM | POA: Diagnosis present

## 2018-01-15 DIAGNOSIS — G009 Bacterial meningitis, unspecified: Secondary | ICD-10-CM | POA: Diagnosis not present

## 2018-01-15 DIAGNOSIS — E039 Hypothyroidism, unspecified: Secondary | ICD-10-CM | POA: Diagnosis not present

## 2018-01-15 DIAGNOSIS — Q969 Turner's syndrome, unspecified: Secondary | ICD-10-CM

## 2018-01-15 DIAGNOSIS — D72829 Elevated white blood cell count, unspecified: Secondary | ICD-10-CM | POA: Diagnosis not present

## 2018-01-15 DIAGNOSIS — A419 Sepsis, unspecified organism: Secondary | ICD-10-CM | POA: Diagnosis not present

## 2018-01-15 DIAGNOSIS — Z888 Allergy status to other drugs, medicaments and biological substances status: Secondary | ICD-10-CM | POA: Diagnosis not present

## 2018-01-15 DIAGNOSIS — R1084 Generalized abdominal pain: Secondary | ICD-10-CM | POA: Diagnosis not present

## 2018-01-15 DIAGNOSIS — F411 Generalized anxiety disorder: Secondary | ICD-10-CM | POA: Diagnosis not present

## 2018-01-15 DIAGNOSIS — B955 Unspecified streptococcus as the cause of diseases classified elsewhere: Secondary | ICD-10-CM | POA: Diagnosis not present

## 2018-01-15 DIAGNOSIS — G9341 Metabolic encephalopathy: Secondary | ICD-10-CM | POA: Diagnosis not present

## 2018-01-15 DIAGNOSIS — E876 Hypokalemia: Secondary | ICD-10-CM | POA: Diagnosis not present

## 2018-01-15 DIAGNOSIS — R51 Headache: Secondary | ICD-10-CM | POA: Diagnosis not present

## 2018-01-15 DIAGNOSIS — E785 Hyperlipidemia, unspecified: Secondary | ICD-10-CM | POA: Diagnosis not present

## 2018-01-15 DIAGNOSIS — R197 Diarrhea, unspecified: Secondary | ICD-10-CM | POA: Diagnosis not present

## 2018-01-15 DIAGNOSIS — I340 Nonrheumatic mitral (valve) insufficiency: Secondary | ICD-10-CM | POA: Diagnosis not present

## 2018-01-15 DIAGNOSIS — I351 Nonrheumatic aortic (valve) insufficiency: Secondary | ICD-10-CM | POA: Diagnosis not present

## 2018-01-15 DIAGNOSIS — Q231 Congenital insufficiency of aortic valve: Secondary | ICD-10-CM | POA: Diagnosis not present

## 2018-01-15 DIAGNOSIS — R296 Repeated falls: Secondary | ICD-10-CM | POA: Diagnosis not present

## 2018-01-15 HISTORY — DX: Other complications of anesthesia, initial encounter: T88.59XA

## 2018-01-15 HISTORY — DX: Nausea with vomiting, unspecified: R11.2

## 2018-01-15 HISTORY — DX: Adverse effect of unspecified anesthetic, initial encounter: T41.45XA

## 2018-01-15 HISTORY — DX: Other specified postprocedural states: Z98.890

## 2018-01-15 LAB — DIFFERENTIAL
Basophils Absolute: 0.1 10*3/uL (ref 0.0–0.1)
Basophils Relative: 0 %
Eosinophils Absolute: 0 10*3/uL (ref 0.0–0.5)
Eosinophils Relative: 0 %
Lymphocytes Relative: 4 %
Lymphs Abs: 1.5 10*3/uL (ref 0.7–4.0)
MONO ABS: 1.5 10*3/uL — AB (ref 0.1–1.0)
Monocytes Relative: 4 %
Neutro Abs: 34.6 10*3/uL — ABNORMAL HIGH (ref 1.7–7.7)
Neutrophils Relative %: 86 %

## 2018-01-15 LAB — COMPREHENSIVE METABOLIC PANEL
ALBUMIN: 3 g/dL — AB (ref 3.5–5.0)
ALT: 36 U/L (ref 0–44)
ANION GAP: 14 (ref 5–15)
AST: 34 U/L (ref 15–41)
Alkaline Phosphatase: 139 U/L — ABNORMAL HIGH (ref 38–126)
BUN: 23 mg/dL — AB (ref 6–20)
CHLORIDE: 85 mmol/L — AB (ref 98–111)
CO2: 26 mmol/L (ref 22–32)
Calcium: 8.2 mg/dL — ABNORMAL LOW (ref 8.9–10.3)
Creatinine, Ser: 0.57 mg/dL (ref 0.44–1.00)
GFR calc Af Amer: >60 mL/min (ref >60)
GFR calc non Af Amer: >60 mL/min (ref >60)
Glucose, Bld: 177 mg/dL — ABNORMAL HIGH (ref 70–99)
POTASSIUM: 2.9 mmol/L — AB (ref 3.5–5.1)
SODIUM: 125 mmol/L — AB (ref 135–145)
Total Bilirubin: 0.9 mg/dL (ref 0.3–1.2)
Total Protein: 7.3 g/dL (ref 6.5–8.1)

## 2018-01-15 LAB — I-STAT CG4 LACTIC ACID, ED: Lactic Acid, Venous: 2.53 mmol/L (ref 0.5–1.9)

## 2018-01-15 LAB — I-STAT BETA HCG BLOOD, ED (MC, WL, AP ONLY)

## 2018-01-15 LAB — URINALYSIS, ROUTINE W REFLEX MICROSCOPIC
Bacteria, UA: NONE SEEN
Bilirubin Urine: NEGATIVE
Glucose, UA: NEGATIVE mg/dL
KETONES UR: 5 mg/dL — AB
Leukocytes, UA: NEGATIVE
Nitrite: NEGATIVE
Protein, ur: 100 mg/dL — AB
Specific Gravity, Urine: 1.02 (ref 1.005–1.030)
pH: 6 (ref 5.0–8.0)

## 2018-01-15 LAB — SODIUM, URINE, RANDOM: Sodium, Ur: <10 mmol/L

## 2018-01-15 LAB — MAGNESIUM: Magnesium: 2.3 mg/dL (ref 1.7–2.4)

## 2018-01-15 LAB — LIPASE, BLOOD: Lipase: 27 U/L (ref 11–51)

## 2018-01-15 LAB — CBC
HEMATOCRIT: 44.5 % (ref 36.0–46.0)
HEMOGLOBIN: 15 g/dL (ref 12.0–15.0)
MCH: 28.7 pg (ref 26.0–34.0)
MCHC: 33.7 g/dL (ref 30.0–36.0)
MCV: 85.2 fL (ref 80.0–100.0)
Platelets: 147 10*3/uL — ABNORMAL LOW (ref 150–400)
RBC: 5.22 MIL/uL — AB (ref 3.87–5.11)
RDW: 13.3 % (ref 11.5–15.5)
WBC: 41.2 10*3/uL — ABNORMAL HIGH (ref 4.0–10.5)
nRBC: 0 % (ref 0.0–0.2)

## 2018-01-15 MED ORDER — POTASSIUM CHLORIDE IN NACL 40-0.9 MEQ/L-% IV SOLN
INTRAVENOUS | Status: DC
  Administered 2018-01-16: 75 mL/h via INTRAVENOUS
  Filled 2018-01-15: qty 1000

## 2018-01-15 MED ORDER — IOHEXOL 300 MG/ML  SOLN
30.0000 mL | Freq: Once | INTRAMUSCULAR | Status: AC | PRN
  Administered 2018-01-15: 30 mL via ORAL

## 2018-01-15 MED ORDER — SODIUM CHLORIDE 0.9 % IV SOLN
2.0000 g | Freq: Once | INTRAVENOUS | Status: AC
  Administered 2018-01-15: 2 g via INTRAVENOUS
  Filled 2018-01-15: qty 2

## 2018-01-15 MED ORDER — IOPAMIDOL (ISOVUE-300) INJECTION 61%
100.0000 mL | Freq: Once | INTRAVENOUS | Status: AC | PRN
  Administered 2018-01-15: 100 mL via INTRAVENOUS

## 2018-01-15 MED ORDER — SODIUM CHLORIDE 0.9 % IV BOLUS (SEPSIS)
1000.0000 mL | Freq: Once | INTRAVENOUS | Status: AC
  Administered 2018-01-15: 1000 mL via INTRAVENOUS

## 2018-01-15 MED ORDER — SODIUM CHLORIDE 0.9 % IV SOLN
2.0000 g | Freq: Two times a day (BID) | INTRAVENOUS | Status: DC
  Administered 2018-01-16 – 2018-01-20 (×9): 2 g via INTRAVENOUS
  Filled 2018-01-15 (×5): qty 2
  Filled 2018-01-15: qty 20
  Filled 2018-01-15 (×3): qty 2
  Filled 2018-01-15: qty 20
  Filled 2018-01-15: qty 2

## 2018-01-15 MED ORDER — SODIUM CHLORIDE (PF) 0.9 % IJ SOLN
INTRAMUSCULAR | Status: AC
  Filled 2018-01-15: qty 50

## 2018-01-15 MED ORDER — LIDOCAINE-EPINEPHRINE (PF) 2 %-1:200000 IJ SOLN
10.0000 mL | Freq: Once | INTRAMUSCULAR | Status: AC
  Administered 2018-01-15: 10 mL
  Filled 2018-01-15: qty 10

## 2018-01-15 MED ORDER — POTASSIUM CHLORIDE 10 MEQ/100ML IV SOLN
10.0000 meq | Freq: Once | INTRAVENOUS | Status: AC
  Administered 2018-01-15: 10 meq via INTRAVENOUS
  Filled 2018-01-15: qty 100

## 2018-01-15 MED ORDER — ACETAMINOPHEN 325 MG PO TABS
650.0000 mg | ORAL_TABLET | Freq: Four times a day (QID) | ORAL | Status: DC | PRN
  Administered 2018-01-20: 650 mg via ORAL
  Filled 2018-01-15: qty 2

## 2018-01-15 MED ORDER — LEVOTHYROXINE SODIUM 75 MCG PO TABS
75.0000 ug | ORAL_TABLET | Freq: Every day | ORAL | Status: DC
  Administered 2018-01-16 – 2018-01-20 (×5): 75 ug via ORAL
  Filled 2018-01-15 (×5): qty 1

## 2018-01-15 MED ORDER — ONDANSETRON HCL 4 MG PO TABS: 4.0000 mg | ORAL_TABLET | Freq: Four times a day (QID) | ORAL | Status: DC | PRN

## 2018-01-15 MED ORDER — SODIUM CHLORIDE 0.9 % IV BOLUS (SEPSIS)
500.0000 mL | Freq: Once | INTRAVENOUS | Status: AC
  Administered 2018-01-15: 500 mL via INTRAVENOUS

## 2018-01-15 MED ORDER — ACETAMINOPHEN 650 MG RE SUPP: 650.0000 mg | Freq: Four times a day (QID) | RECTAL | Status: DC | PRN

## 2018-01-15 MED ORDER — IOPAMIDOL (ISOVUE-300) INJECTION 61%
INTRAVENOUS | Status: AC
  Filled 2018-01-15: qty 100

## 2018-01-15 MED ORDER — FLUOXETINE HCL 20 MG PO CAPS: 40.0000 mg | ORAL_CAPSULE | Freq: Every day | ORAL | Status: DC

## 2018-01-15 MED ORDER — SODIUM CHLORIDE 0.9 % IV BOLUS (SEPSIS)
250.0000 mL | Freq: Once | INTRAVENOUS | Status: AC
  Administered 2018-01-15: 250 mL via INTRAVENOUS

## 2018-01-15 MED ORDER — ONDANSETRON HCL 4 MG/2ML IJ SOLN: 4.0000 mg | Freq: Four times a day (QID) | INTRAMUSCULAR | Status: DC | PRN

## 2018-01-15 MED ORDER — METRONIDAZOLE IN NACL 5-0.79 MG/ML-% IV SOLN
500.0000 mg | Freq: Three times a day (TID) | INTRAVENOUS | Status: DC
  Administered 2018-01-15: 500 mg via INTRAVENOUS
  Filled 2018-01-15: qty 100

## 2018-01-15 MED ORDER — VANCOMYCIN HCL IN DEXTROSE 1-5 GM/200ML-% IV SOLN
1000.0000 mg | Freq: Once | INTRAVENOUS | Status: DC
  Filled 2018-01-15: qty 200

## 2018-01-15 NOTE — Progress Notes (Signed)
Pharmacy Note   A consult was received from an ED physician for vancomycin and cefepime per pharmacy dosing.    The patient's profile has been reviewed for ht/wt/allergies/indication/available labs.    A one time order has been placed for vancomycin 1000 mg IV x1 and cefepime 2 gr IV x1 .  Further antibiotics/pharmacy consults should be ordered by admitting physician if indicated.                       Thank you,  Royetta Asal, PharmD, BCPS Pager [phone] 01/15/2018 6:21 PM

## 2018-01-15 NOTE — Progress Notes (Signed)
Pharmacy Antibiotic Note  Emma Martin is a 37 y.o. female admitted on 01/15/2018 with r/o meningits.  Pharmacy has been consulted for vancomycin dosing.  Plan: Vancomycin 1250 mg x1 then 750 mg IV q12h for est AUC = 543 Goal AUC >500 in meningitis F/u scr/cultures/levels Rocephin 2 Gm IV q12h (MD)     Temp (24hrs), Avg:96.6 F (35.9 C), Min:96.6 F (35.9 C), Max:96.6 F (35.9 C)  Recent Labs  Lab 01/15/18 1639 01/15/18 1727  WBC 41.2*  --   CREATININE 0.57  --   LATICACIDVEN  --  2.53*    CrCl cannot be calculated (Unknown ideal weight.).    Allergies  Allergen Reactions  . Benzoin Rash    Antimicrobials this admission: 12/27 cefepime >> x1 12/28 vancomycin >>  12/28 rocephin >>  Dose adjustments this admission:   Microbiology results:  BCx:   UCx:    Sputum:    MRSA PCR:   Thank you for allowing pharmacy to be a part of this patient's care.  Dorrene German 01/15/2018 11:14 PM

## 2018-01-15 NOTE — ED Provider Notes (Addendum)
[facility] DEPT Provider Note   CSN: [id number] Arrival date & time: 01/15/18  1609     History   Chief Complaint Chief Complaint  Patient presents with  . Weakness  . Emesis    HPI Emma Martin is a 37 y.o. female.  HPI 37 year old female history of Turner syndrome presents today with generalized weakness and falls today due to weakness.  She began having nausea and vomiting on Sunday.  She vomited through Monday afternoon.  She describes it as nausea with some flulike symptoms substance.  She did not have any associated fever or diarrhea.  She has had poor appetite since that time but has been eating and drinking without vomiting.  She presents today due to generalized weakness and falling twice today.  She has an abrasion on her face.  She states that she has some headache but that this preceded the falls. Past Medical History:  Diagnosis Date  . Bicuspid aortic valve    , mild aortic insufficiency. (No SBE prophylaxis needed)  . Dyslipidemia   . Generalized anxiety disorder    , with obsessive-compulsive traits.  . Hypertension   . Hypothyroidism   . Mixed gonadal dysgenesis    , (additional Y-bearing cell line) for which she reportedly underwent bilateral gonadectomy (patient unaware; needs confirmation) due to malignancy potential.  . Primary amenorrhea    , treated starting at age 66 or 41 with estrogen and progesterone  . Scoliosis    , treated with Harrington rods (1997) for stabilization.  . Seasonal allergic rhinitis   . Short stature    , previously treated with growth hormone age 10-15 years.  Radford Pax syndrome    , previously followed by pediatric endocrinologist Freddy Jaksch, MD at [facility] through her early 3s).    Patient Active Problem List   Diagnosis Date Noted  . Bicuspid aortic valve 04/06/2013  . HTN (hypertension) 04/06/2013    History reviewed. No pertinent surgical history.   OB History   No  obstetric history on file.      Home Medications    Prior to Admission medications   Medication Sig Start Date End Date Taking? Authorizing Provider  fluticasone (FLONASE) 50 MCG/ACT nasal spray Place 2 sprays into both nostrils daily.    [provider]  levothyroxine (SYNTHROID, LEVOTHROID) 75 MCG tablet Take 75 mcg by mouth daily before breakfast.    [provider]  lisinopril-hydrochlorothiazide (PRINZIDE,ZESTORETIC) 10-12.5 MG per tablet Take 1 tablet by mouth daily.    [provider]  Multiple Vitamins-Minerals (CENTRUM PO) Take 1 tablet by mouth daily.    [provider]  norethindrone-ethinyl estradiol-iron (MICROGESTIN FE,GILDESS FE,LOESTRIN FE) 1.5-30 MG-MCG tablet Take 1 tablet by mouth daily.    [provider]  sertraline (ZOLOFT) 100 MG tablet Take 100 mg by mouth daily.    [provider]    Family History Family History  Problem Relation Age of Onset  . Other Father        A + W  . Cancer Maternal Grandmother   . Cancer Paternal Grandfather   . Hypertension Mother   . Other Mother        Dyslipidemia  . Hypothyroidism Mother   . Mitral valve prolapse Mother     Social History Social History   Tobacco Use  . Smoking status: Never Smoker  . Smokeless tobacco: Never Used  Substance Use Topics  . Alcohol use: Yes    Alcohol/week: 1.0 -  2.0 standard drinks    Types: 1 - 2 Glasses of wine per week    Comment: Occasionally  . Drug use: No     Allergies   Benzoin   Review of Systems Review of Systems   Physical Exam Updated Vital Signs BP 104/77 (BP Location: Right Arm)   Pulse (!) 111   Resp 14   LMP 12/25/2017   SpO2 100%   Physical Exam Vitals signs and nursing note reviewed.  Constitutional:      Appearance: Normal appearance. She is normal weight.  HENT:     Head: Normocephalic.      Right Ear: External ear normal.     Left Ear: External ear normal.     Nose: Nose normal.      Mouth/Throat:     Mouth: Mucous membranes are moist.  Eyes:     Extraocular Movements: Extraocular movements intact.     Pupils: Pupils are equal, round, and reactive to light.  Neck:     Musculoskeletal: Normal range of motion.  Cardiovascular:     Rate and Rhythm: Tachycardia present.     Comments: Decreased pulses Pulmonary:     Effort: Pulmonary effort is normal.     Breath sounds: Normal breath sounds.  Abdominal:     General: Abdomen is flat.     Tenderness: There is abdominal tenderness.  Musculoskeletal: Normal range of motion.  Skin:    General: Skin is warm and dry.     Capillary Refill: Capillary refill takes less than 2 seconds.  Neurological:     General: No focal deficit present.     Mental Status: She is alert.  Psychiatric:        Mood and Affect: Mood normal.      ED Treatments / Results  Labs (all labs ordered are listed, but only abnormal results are displayed) Labs Reviewed  CULTURE, BLOOD (ROUTINE X 2)  CULTURE, BLOOD (ROUTINE X 2)  COMPREHENSIVE METABOLIC PANEL  CBC  URINALYSIS, ROUTINE W REFLEX MICROSCOPIC  LIPASE, BLOOD  I-STAT BETA HCG BLOOD, ED ([facility], [facility], [facility] ONLY)  I-STAT CG4 LACTIC ACID, ED    EKG None  Radiology Dg Chest Port 1 View  Result Date: 01/15/2018 CLINICAL DATA:  Pt reports generalized weakness, dizziness with n/v and h/a Sunday night. Went to a clinic yesterday for her h/a and was given 2 injections with some relief. Her father reports slurred speech, LSN was 2 days ago. EXAM: PORTABLE CHEST 1 VIEW COMPARISON:  08/27/2016 FINDINGS: Shallow lung inflation. Heart size is normal. No focal consolidations or pleural effusions. No pulmonary edema. Thoracolumbar spinal fixation rods. IMPRESSION: No evidence for acute cardiopulmonary abnormality. Electronically Signed   By: Nolon Nations M.D.   On: 01/15/2018 18:03   US Abdomen Limited Ruq  Result Date: 01/15/2018 CLINICAL DATA:  Right upper quadrant pain x5 days. EXAM: ULTRASOUND  ABDOMEN LIMITED RIGHT UPPER QUADRANT COMPARISON:  None. FINDINGS: Gallbladder: No gallstones or wall thickening visualized. No sonographic Murphy sign noted by sonographer. Common bile duct: Diameter: 2.7 mm Liver: No focal lesion identified. Within normal limits in parenchymal echogenicity. Portal vein is patent on color Doppler imaging with normal direction of blood flow towards the liver. Other: Relative to the liver, the right adjacent kidney is slightly echogenic but maintains its cortical-medullary distinction. The possibility of mild medical renal disease is not entirely excluded. IMPRESSION: Unremarkable right upper quadrant abdominal ultrasound. Slightly echogenic appearance of the right kidney relative to the liver, a nonspecific finding  but can be seen in medical renal disease. Electronically Signed   By: Ashley Royalty M.D.   On: 01/15/2018 18:53    Procedures .Critical Care Performed by: Pattricia Boss, MD Authorized by: Pattricia Boss, MD   Critical care provider statement:    Critical care time (minutes):  45   Critical care end time:  01/15/2018 11:40 PM   Critical care was necessary to treat or prevent imminent or life-threatening deterioration of the following conditions:  Sepsis   Critical care was time spent personally by me on the following activities:  Discussions with consultants, evaluation of patient's response to treatment, examination of patient, ordering and performing treatments and interventions, ordering and review of laboratory studies, ordering and review of radiographic studies, pulse oximetry, re-evaluation of patient's condition, obtaining history from patient or surrogate and review of old charts  .Lumbar Puncture Date/Time: 01/15/2018 11:41 PM Performed by: Pattricia Boss, MD Authorized by: Pattricia Boss, MD   Consent:    Consent obtained:  Written   Consent given by:  Patient   Risks discussed:  Bleeding, infection and pain   Alternatives discussed:  No  treatment Pre-procedure details:    Procedure purpose:  Diagnostic   Preparation: Patient was prepped and draped in usual sterile fashion   Anesthesia (see MAR for exact dosages):    Anesthesia method:  Local infiltration   Local anesthetic:  Lidocaine 2% WITH epi Procedure details:    Lumbar space:  L4-L5 interspace   Patient position:  L lateral decubitus   Needle gauge:  18   Needle type:  Spinal needle - Quincke tip   Needle length (in):  2.5   Ultrasound guidance: no     Number of attempts:  2   Opening pressure (cm H2O):  20   Fluid appearance:  Cloudy   Tubes of fluid:  4   Total volume (ml):  6 Post-procedure:    Puncture site:  Adhesive bandage applied   Patient tolerance of procedure:  Tolerated well, no immediate complications   (including critical care time)  Medications Ordered in ED Medications  sodium chloride 0.9 % bolus 1,000 mL (has no administration in time range)    And  sodium chloride 0.9 % bolus 500 mL (has no administration in time range)    And  sodium chloride 0.9 % bolus 250 mL (has no administration in time range)     Initial Impression / Assessment and Plan / ED Course  I have reviewed the triage vital signs and the nursing notes.  Pertinent labs & imaging results that were available during my care of the patient were reviewed by me and considered in my medical decision making (see chart for details).     7:26 PM IV fluids and abx infusing Sepsis - Repeat Assessment  Performed at:    7:27 PM   Vitals     Blood pressure 117/87, pulse (!) 113, temperature (!) 96.6 F (35.9 C), temperature source Rectal, resp. rate 17, last menstrual period 12/25/2017, SpO2 100 %.  Heart:     Tachycardic  Lungs:    CTA  Capillary Refill:   <2 sec  Peripheral Pulse:   Radial pulse palpable  Skin:     Pale   The recent clinical data is shown below. Vitals:   01/15/18 1815 01/15/18 1830 01/15/18 1845 01/15/18 1900  BP:  (!) 119/91  117/87    Pulse: (!) 105 (!) 109 (!) 111 (!) 113  Resp: 18 18 18  17  Temp:      TempSrc:      SpO2: 100% 100% 100% 100%   Awaiting urine Discussed with Dr. Posey Pronto Given patient's leukocytosis, and volume depletion with hyponatremia, will add CT of abdomen.  Pain appeared to be in right upper quadrant ultrasound is negative.  I have concerns for other intra-abdominal process including appendicitis. She remains tachycardic but is normotensive.  Awaiting differential on CBC and repeat lactic Discussed with Dr. Posey Pronto.  Reviewed ct head and abdome LP done=-Dr. Posey Pronto will place orders  Final Clinical Impressions(s) / ED Diagnoses   Final diagnoses:  RUQ pain  Sepsis, due to unspecified organism, unspecified whether acute organ dysfunction present [facility])    ED Discharge Orders    None       Pattricia Boss, MD 01/15/18 2218    Pattricia Boss, MD 01/15/18 2883    Pattricia Boss, MD 01/15/18 2344

## 2018-01-15 NOTE — H&P (Signed)
History and Physical    TAJANA CROTTEAU WCB:[id number] DOB: [birth year] DOA: 01/15/2018  PCP: Maurice Small, MD  Patient coming from: Home  I have personally briefly reviewed patient's old medical records in [place]  Chief Complaint: Headache, neck pain, nausea, malaise  HPI: Emma Martin is a 37 y.o. female with medical history significant for Turner syndrome, hypertension, hypothyroidism, bicuspid aortic valve, and generalized anxiety disorder who presents to the ED with 1 week of posterior and temporal headache and neck pain.  Several days later she developed nausea and vomiting, dizziness, and generalized weakness/malaise.  She reports a small amount of diarrhea and mild right upper quadrant abdominal pain.  She has had some shortness of breath.  She denies any fevers but has felt chills and diaphoresis.  She reports decreased urine output today, but no dysuria previously.  ED Course:  Initial vitals showed BP 108/84, pulse 104, RR 15, temp 96.6 Fahrenheit rectally, SPO2 99% on room air.  Labs are notable for WBC 41.2 with 86% neutrophils and mild left shift, hemoglobin 15, platelets 147, sodium 125, potassium 2.9, alk phos 139, lipase 27, negative beta-hCG, lactic acid 2.53, urinalysis negative for UTI.  Portable chest x-ray was negative for consolidations or pleural effusions.  RUQ ultrasound was negative for gallstones or evidence of cholecystitis, CBD diameter 2.7 mm.  Blood cultures were collected and patient was started on IV vancomycin, metronidazole, cefepime.  She was given 1.75 L normal saline and K 10 mEq IV x1.  CT abdomen/pelvis with contrast was obtained which was negative for any acute abnormality in the abdomen and pelvis, a 2.1 x 2.7 cm heterogeneous enhancing mass in the lower pole left kidney was noted.  The hospital service was consulted to admit for sepsis of unknown origin.  On my evaluation patient had worsening neck stiffness and decreased range of  motion.  CT head and cervical spine were obtained and were negative for acute intracranial hemorrhage or acute abnormality of the cervical spine.  I discussed the case with EDP, Dr. Jeanell Sparrow, who subsequently performed an LP.  Review of Systems: As per HPI otherwise 10 point review of systems negative.    Past Medical History:  Diagnosis Date  . Bicuspid aortic valve    , mild aortic insufficiency. (No SBE prophylaxis needed)  . Dyslipidemia   . Generalized anxiety disorder    , with obsessive-compulsive traits.  . Hypertension   . Hypothyroidism   . Mixed gonadal dysgenesis    , (additional Y-bearing cell line) for which she reportedly underwent bilateral gonadectomy (patient unaware; needs confirmation) due to malignancy potential.  . Primary amenorrhea    , treated starting at age 29 or 5 with estrogen and progesterone  . Scoliosis    , treated with Harrington rods (1997) for stabilization.  . Seasonal allergic rhinitis   . Short stature    , previously treated with growth hormone age 20-15 years.  Radford Pax syndrome    , previously followed by pediatric endocrinologist Freddy Jaksch, MD at [facility] through her early 33s).    History reviewed. No pertinent surgical history.   reports that she has never smoked. She has never used smokeless tobacco. She reports current alcohol use of about 1.0 - 2.0 standard drinks of alcohol per week. She reports that she does not use drugs.  Allergies  Allergen Reactions  . Benzoin Rash    Family History  Problem Relation Age of Onset  . Other Father  A + W  . Cancer Maternal Grandmother   . Cancer Paternal Grandfather   . Hypertension Mother   . Other Mother        Dyslipidemia  . Hypothyroidism Mother   . Mitral valve prolapse Mother      Prior to Admission medications   Medication Sig Start Date End Date Taking? Authorizing Provider  FLUoxetine (PROZAC) 40 MG capsule Take 40 mg by mouth daily.   Yes [provider]  fluticasone (FLONASE) 50 MCG/ACT nasal spray Place 2 sprays into both nostrils daily.   Yes [provider]  levothyroxine (SYNTHROID, LEVOTHROID) 75 MCG tablet Take 75 mcg by mouth daily before breakfast.   Yes [provider]  lisinopril-hydrochlorothiazide (PRINZIDE,ZESTORETIC) 10-12.5 MG per tablet Take 1 tablet by mouth daily.   Yes [provider]  meclizine (ANTIVERT) 25 MG tablet Take 25 mg by mouth 3 (three) times daily as needed for dizziness.   Yes [provider]  Multiple Vitamins-Minerals (CENTRUM PO) Take 1 tablet by mouth daily.   Yes [provider]  norethindrone-ethinyl estradiol-iron (MICROGESTIN FE,GILDESS FE,LOESTRIN FE) 1.5-30 MG-MCG tablet Take 1 tablet by mouth daily.   Yes [provider]    Physical Exam: Vitals:   01/15/18 2345 01/16/18 0000 01/16/18 0011 01/16/18 0030  BP: 128/87 130/88  135/86  Pulse: (!) 115 (!) 117  (!) 122  Resp: (!) 21 (!) 21  (!) 21  Temp:      TempSrc:      SpO2: 100% 100%  100%  Weight:   49.9 kg   Height:   5' (1.524 m)     Constitutional: Patient resting in bed, appears somewhat uncomfortable, features of Turner syndrome Eyes: PERRL, lids and conjunctivae normal ENMT: Mucous membranes are moist. Posterior pharynx clear of any exudate or lesions.Normal dentition.  Neck: Hypoplasia of neck, range of motion decreased with flexion due to pain Respiratory: clear to auscultation bilaterally, no wheezing, no crackles. Normal respiratory effort. No accessory muscle use.  Cardiovascular: Regular rate and rhythm, no murmurs / rubs / gallops. No extremity edema.  Abdomen: Mild right upper quadrant tenderness, no masses palpated. No hepatosplenomegaly. Bowel sounds positive.  Musculoskeletal: no clubbing / cyanosis.  Good ROM of extremities, no contractures. Normal muscle tone.  Skin: Early bruising left forehead Neurologic: CN 2-12 grossly intact. Sensation intact.  Strength 5/5 in all 4.  Psychiatric: Normal judgment and insight. Alert and oriented x 3. Normal mood.     Labs on Admission: I have personally reviewed following labs and imaging studies  CBC: Recent Labs  Lab 01/15/18 1639  WBC 41.2*  NEUTROABS 34.6*  HGB 15.0  HCT 44.5  MCV 85.2  PLT 599*   Basic Metabolic Panel: Recent Labs  Lab 01/15/18 1639 01/15/18 1657  NA 125*  --   K 2.9*  --   CL 85*  --   CO2 26  --   GLUCOSE 177*  --   BUN 23*  --   CREATININE 0.57  --   CALCIUM 8.2*  --   MG  --  2.3   GFR: Estimated Creatinine Clearance: 69.8 mL/min (by C-G formula based on SCr of 0.57 mg/dL). Liver Function Tests: Recent Labs  Lab 01/15/18 1639  AST 34  ALT 36  ALKPHOS 139*  BILITOT 0.9  PROT 7.3  ALBUMIN 3.0*   Recent Labs  Lab 01/15/18 1657  LIPASE 27   No results for input(s): AMMONIA in the last 168 hours. Coagulation Profile:  No results for input(s): INR, PROTIME in the last 168 hours. Cardiac Enzymes: No results for input(s): CKTOTAL, CKMB, CKMBINDEX, TROPONINI in the last 168 hours. BNP (last 3 results) No results for input(s): PROBNP in the last 8760 hours. HbA1C: No results for input(s): HGBA1C in the last 72 hours. CBG: No results for input(s): GLUCAP in the last 168 hours. Lipid Profile: No results for input(s): CHOL, HDL, LDLCALC, TRIG, CHOLHDL, LDLDIRECT in the last 72 hours. Thyroid Function Tests: No results for input(s): TSH, T4TOTAL, FREET4, T3FREE, THYROIDAB in the last 72 hours. Anemia Panel: No results for input(s): VITAMINB12, FOLATE, FERRITIN, TIBC, IRON, RETICCTPCT in the last 72 hours. Urine analysis:    Component Value Date/Time   COLORURINE YELLOW 01/15/2018 1905   APPEARANCEUR HAZY (A) 01/15/2018 1905   LABSPEC 1.020 01/15/2018 1905   LABSPEC 1.010 11/24/2006 1033   PHURINE 6.0 01/15/2018 1905   GLUCOSEU NEGATIVE 01/15/2018 1905   HGBUR SMALL (A) 01/15/2018 1905   BILIRUBINUR NEGATIVE 01/15/2018 1905    BILIRUBINUR Negative 11/24/2006 1033   KETONESUR 5 (A) 01/15/2018 1905   PROTEINUR 100 (A) 01/15/2018 1905   NITRITE NEGATIVE 01/15/2018 1905   LEUKOCYTESUR NEGATIVE 01/15/2018 1905   LEUKOCYTESUR Negative 11/24/2006 1033    Radiological Exams on Admission: Ct Head Wo Contrast  Result Date: 01/15/2018 CLINICAL DATA:  Initial evaluation for acute headache, falls. EXAM: CT HEAD WITHOUT CONTRAST CT CERVICAL SPINE WITHOUT CONTRAST TECHNIQUE: Multidetector CT imaging of the head and cervical spine was performed following the standard protocol without intravenous contrast. Multiplanar CT image reconstructions of the cervical spine were also generated. COMPARISON:  None. FINDINGS: CT HEAD FINDINGS Brain: Cerebral volume within normal limits for patient age. No evidence for acute intracranial hemorrhage. No findings to suggest acute large vessel territory infarct. No mass lesion, midline shift, or mass effect. Ventricles are normal in size without evidence for hydrocephalus. No extra-axial fluid collection identified. Vascular: No hyperdense vessel identified. Skull: Scalp soft tissues demonstrate no acute abnormality. Calvarium intact. Sinuses/Orbits: Globes and orbital soft tissues within normal limits. Moderate mucosal thickening and opacity within the ethmoidal air cells and sphenoid sinuses. Paranasal sinuses are otherwise clear. No mastoid effusion. CT CERVICAL SPINE FINDINGS Alignment: Straightening of the normal cervical lordosis. No listhesis or malalignment. Skull base and vertebrae: Skull base intact. Normal C1-2 articulations are preserved in the dens is intact. Vertebral body heights maintained. No acute fracture. Soft tissues and spinal canal: Soft tissues of the neck demonstrate no acute finding. No abnormal prevertebral edema. Spinal canal within normal limits. Disc levels: Mild degenerative spondylolysis present at C4-5 and C5-6. No significant spinal stenosis within the cervical spine. Thoracic  fusion hardware partially visualized. Upper chest: Visualized upper chest limited evaluation due to streak artifact from thoracic hardware. No obvious abnormality identified. Other: None. IMPRESSION: CT BRAIN: 1. No acute intracranial abnormality. 2. Moderate sphenoid ethmoidal sinus disease. CT CERVICAL SPINE: No CT evidence for acute traumatic injury within the cervical spine. Electronically Signed   By: Jeannine Boga M.D.   On: 01/15/2018 23:10   Ct Cervical Spine Wo Contrast  Result Date: 01/15/2018 CLINICAL DATA:  Initial evaluation for acute headache, falls. EXAM: CT HEAD WITHOUT CONTRAST CT CERVICAL SPINE WITHOUT CONTRAST TECHNIQUE: Multidetector CT imaging of the head and cervical spine was performed following the standard protocol without intravenous contrast. Multiplanar CT image reconstructions of the cervical spine were also generated. COMPARISON:  None. FINDINGS: CT HEAD FINDINGS Brain: Cerebral volume within normal limits for patient age. No evidence for acute  intracranial hemorrhage. No findings to suggest acute large vessel territory infarct. No mass lesion, midline shift, or mass effect. Ventricles are normal in size without evidence for hydrocephalus. No extra-axial fluid collection identified. Vascular: No hyperdense vessel identified. Skull: Scalp soft tissues demonstrate no acute abnormality. Calvarium intact. Sinuses/Orbits: Globes and orbital soft tissues within normal limits. Moderate mucosal thickening and opacity within the ethmoidal air cells and sphenoid sinuses. Paranasal sinuses are otherwise clear. No mastoid effusion. CT CERVICAL SPINE FINDINGS Alignment: Straightening of the normal cervical lordosis. No listhesis or malalignment. Skull base and vertebrae: Skull base intact. Normal C1-2 articulations are preserved in the dens is intact. Vertebral body heights maintained. No acute fracture. Soft tissues and spinal canal: Soft tissues of the neck demonstrate no acute  finding. No abnormal prevertebral edema. Spinal canal within normal limits. Disc levels: Mild degenerative spondylolysis present at C4-5 and C5-6. No significant spinal stenosis within the cervical spine. Thoracic fusion hardware partially visualized. Upper chest: Visualized upper chest limited evaluation due to streak artifact from thoracic hardware. No obvious abnormality identified. Other: None. IMPRESSION: CT BRAIN: 1. No acute intracranial abnormality. 2. Moderate sphenoid ethmoidal sinus disease. CT CERVICAL SPINE: No CT evidence for acute traumatic injury within the cervical spine. Electronically Signed   By: Jeannine Boga M.D.   On: 01/15/2018 23:10   Ct Abdomen Pelvis W Contrast  Result Date: 01/15/2018 CLINICAL DATA:  Status post fall few days ago with acute generalized abdomen pain EXAM: CT ABDOMEN AND PELVIS WITH CONTRAST TECHNIQUE: Multidetector CT imaging of the abdomen and pelvis was performed using the standard protocol following bolus administration of intravenous contrast. CONTRAST:  142m ISOVUE-300 IOPAMIDOL (ISOVUE-300) INJECTION 61%, 365mOMNIPAQUE IOHEXOL 300 MG/ML SOLN COMPARISON:  None. FINDINGS: Lower chest: Minimal bilateral pleural effusions are noted. Hepatobiliary: Patchy low density is identified near the falciform ligament probably due to focal fatty infiltration of liver. The liver is otherwise normal. The gallbladder is normal. The biliary tree is normal. Pancreas: Unremarkable. No pancreatic ductal dilatation or surrounding inflammatory changes. Spleen: Normal in size without focal abnormality. Adrenals/Urinary Tract: The bilateral adrenal glands are normal. The right kidney is normal. In the lower pole left kidney, there is a heterogeneous enhancing mass measuring 2.1 x 2.7 cm. There is no hydronephrosis bilaterally. The bladder is normal. Stomach/Bowel: Stomach is within normal limits. The appendix is not definitely seen but no inflammation is noted around cecum. No  evidence of bowel wall thickening, distention, or inflammatory changes. Vascular/Lymphatic: No significant vascular findings are present. No enlarged abdominal or pelvic lymph nodes. Reproductive: Uterus and bilateral adnexa are unremarkable. Other: None. Musculoskeletal: Patient status post prior fixation of thoracic and upper lumbar spine. IMPRESSION: No acute abnormality identified in the abdomen pelvis. Heterogeneous enhancing mass in the lower pole left kidney measuring 2.1 x 2.7 cm. Recommend further evaluation with renal MRI on outpatient basis as neoplasm of the kidney is not excluded. Electronically Signed   By: WeAbelardo Diesel.D.   On: 01/15/2018 22:37   Dg Chest Port 1 View  Result Date: 01/15/2018 CLINICAL DATA:  Pt reports generalized weakness, dizziness with n/v and h/a Sunday night. Went to a clinic yesterday for her h/a and was given 2 injections with some relief. Her father reports slurred speech, LSN was 2 days ago. EXAM: PORTABLE CHEST 1 VIEW COMPARISON:  08/27/2016 FINDINGS: Shallow lung inflation. Heart size is normal. No focal consolidations or pleural effusions. No pulmonary edema. Thoracolumbar spinal fixation rods. IMPRESSION: No evidence for acute cardiopulmonary abnormality. Electronically  Signed   By: Nolon Nations M.D.   On: 01/15/2018 18:03   US Abdomen Limited Ruq  Result Date: 01/15/2018 CLINICAL DATA:  Right upper quadrant pain x5 days. EXAM: ULTRASOUND ABDOMEN LIMITED RIGHT UPPER QUADRANT COMPARISON:  None. FINDINGS: Gallbladder: No gallstones or wall thickening visualized. No sonographic Murphy sign noted by sonographer. Common bile duct: Diameter: 2.7 mm Liver: No focal lesion identified. Within normal limits in parenchymal echogenicity. Portal vein is patent on color Doppler imaging with normal direction of blood flow towards the liver. Other: Relative to the liver, the right adjacent kidney is slightly echogenic but maintains its cortical-medullary distinction. The  possibility of mild medical renal disease is not entirely excluded. IMPRESSION: Unremarkable right upper quadrant abdominal ultrasound. Slightly echogenic appearance of the right kidney relative to the liver, a nonspecific finding but can be seen in medical renal disease. Electronically Signed   By: Ashley Royalty M.D.   On: 01/15/2018 18:53     Assessment/Plan Principal Problem:   Sepsis ([place]) Active Problems:   HTN (hypertension)   Hypothyroidism   Generalized anxiety disorder   Hyponatremia   Hypokalemia    Emma Martin is a 37 y.o. female with medical history significant for Turner syndrome, hypertension, hypothyroidism, bicuspid aortic valve, and generalized anxiety disorder who presents to the ED with 1 week of posterior and temporal headache and neck pain, nausea, dizziness, and malaise admitted with sepsis of unknown origin and concern for meningitis.   Sepsis, unspecified source: Patient with marked leukocytosis, tachycardia, mild hypothermia concern for sepsis.  Chest x-ray, RUQ ultrasound and CT abdomen/pelvis are without acute source.  Urinalysis negative for UTI.  She has significant neck stiffness concern for meningitis.  CT head negative for intracranial hemorrhage.  LP was performed in the ED.  She was given a dose of cefepime and metronidazole prior to CSF collection. -Send CSF for cell count and differential, protein and glucose, Gram stain, bacterial culture, cytology.  Opening pressure was 20 per EDP. -Start IV vancomycin and ceftriaxone -Follow-up blood cultures, CSF results -Maintenance IV fluids overnight  Hypokalemia : K 2.9 on admission.  Magnesium 2.3. -s/p IV K 10 mEq x1, however 3 more runs -Recheck in a.m.  Hyponatremia: Sodium 125 on admission. -s/p 1.75 L NS, continue gentle maintenance IV fluids -Goal sodium 131-133 over the first 24 hours -Recheck in a.m.  Hypertension: Blood pressure stable.  She is on lisinopril-HCTZ at home. -Hold BP meds  for now, HCTZ likely contributing to hyponatremia  Hypothyroidism: Resume home Synthroid  Generalized anxiety disorder: On fluoxetine at home. -Holding fluoxetine with hyponatremia  DVT prophylaxis: SCDs Code Status: Full code Family Communication: Discussed with patient and father at bedside Disposition Plan: Pending clinical progress, culture results Consults called: None Admission status: Inpatient   Zada Finders MD [facility] Pager [phone]  If 7PM-7AM, please contact night-coverage [email or web address] Password [facility]  01/16/2018, 12:56 AM

## 2018-01-15 NOTE — ED Triage Notes (Signed)
Pt reports generalized weakness, dizziness with n/v and h/a Sunday night.  Went to a clinic yesterday for her h/a and was given 2 injections with some relief.  Her father reports slurred speech, LSN was 2 days ago.  She reports hx of allergic rhinitis.  She is A&Ox 4.  Denies unilateral weakness.

## 2018-01-15 NOTE — ED Notes (Signed)
ED TO INPATIENT HANDOFF REPORT  Name/Age/Gender Emma Martin 37 y.o. female  Code Status   Home/SNF/Other Home  Chief Complaint WEAKNESS;DIZZINESS  Level of Care/Admitting Diagnosis ED Disposition    ED Disposition Condition [facility] [place] [facility]: [facility] [[id number]]  Level of Care: Med-Surg [16]  Diagnosis: Abdominal pain [081448]  Admitting Physician: Lenore Cordia [[id number]]  Attending Physician: Lenore Cordia [[id number]]  Estimated length of stay: past midnight tomorrow  Certification:: I certify this patient will need inpatient services for at least 2 midnights  PT Class (Do Not Modify): Inpatient [101]  PT Acc Code (Do Not Modify): Private [1]       Medical History Past Medical History:  Diagnosis Date  . Bicuspid aortic valve    , mild aortic insufficiency. (No SBE prophylaxis needed)  . Dyslipidemia   . Generalized anxiety disorder    , with obsessive-compulsive traits.  . Hypertension   . Hypothyroidism   . Mixed gonadal dysgenesis    , (additional Y-bearing cell line) for which she reportedly underwent bilateral gonadectomy (patient unaware; needs confirmation) due to malignancy potential.  . Primary amenorrhea    , treated starting at age 43 or 45 with estrogen and progesterone  . Scoliosis    , treated with Harrington rods (1997) for stabilization.  . Seasonal allergic rhinitis   . Short stature    , previously treated with growth hormone age 11-15 years.  Radford Pax syndrome    , previously followed by pediatric endocrinologist Freddy Jaksch, MD at [facility] through her early 10s).    Allergies Allergies  Allergen Reactions  . Benzoin Rash    IV Location/Drains/Wounds Patient Lines/Drains/Airways Status   Active Line/Drains/Airways    Name:   Placement date:   Placement time:   Site:   Days:   Peripheral IV 01/15/18 Left Antecubital   01/15/18    1724    Antecubital   less than 1           Labs/Imaging Results for orders placed or performed during the hospital encounter of 01/15/18 (from the past 48 hour(s))  Comprehensive metabolic panel     Status: Abnormal   Collection Time: 01/15/18  4:39 PM  Result Value Ref Range   Sodium 125 (L) 135 - 145 mmol/L   Potassium 2.9 (L) 3.5 - 5.1 mmol/L   Chloride 85 (L) 98 - 111 mmol/L   CO2 26 22 - 32 mmol/L   Glucose, Bld 177 (H) 70 - 99 mg/dL   BUN 23 (H) 6 - 20 mg/dL   Creatinine, Ser 0.57 0.44 - 1.00 mg/dL   Calcium 8.2 (L) 8.9 - 10.3 mg/dL   Total Protein 7.3 6.5 - 8.1 g/dL   Albumin 3.0 (L) 3.5 - 5.0 g/dL   AST 34 15 - 41 U/L   ALT 36 0 - 44 U/L   Alkaline Phosphatase 139 (H) 38 - 126 U/L   Total Bilirubin 0.9 0.3 - 1.2 mg/dL   GFR calc non Af Amer >60 >60 mL/min   GFR calc Af Amer >60 >60 mL/min   Anion gap 14 5 - 15    Comment: Performed at [facility], [place] [address], [place], [place] [address]  CBC     Status: Abnormal   Collection Time: 01/15/18  4:39 PM  Result Value Ref Range   WBC 41.2 (H) 4.0 - 10.5 K/uL    Comment: WHITE COUNT CONFIRMED ON SMEAR  RBC 5.22 (H) 3.87 - 5.11 MIL/uL   Hemoglobin 15.0 12.0 - 15.0 g/dL   HCT 44.5 36.0 - 46.0 %   MCV 85.2 80.0 - 100.0 fL   MCH 28.7 26.0 - 34.0 pg   MCHC 33.7 30.0 - 36.0 g/dL   RDW 13.3 11.5 - 15.5 %   Platelets 147 (L) 150 - 400 K/uL   nRBC 0.0 0.0 - 0.2 %    Comment: Performed at [facility], [place] [address] [place], [place] [address]  Differential     Status: Abnormal   Collection Time: 01/15/18  4:39 PM  Result Value Ref Range   Neutrophils Relative % 86 %   Neutro Abs 34.6 (H) 1.7 - 7.7 K/uL   Lymphocytes Relative 4 %   Lymphs Abs 1.5 0.7 - 4.0 K/uL   Monocytes Relative 4 %   Monocytes Absolute 1.5 (H) 0.1 - 1.0 K/uL   Eosinophils Relative 0 %   Eosinophils Absolute 0.0 0.0 - 0.5 K/uL   Basophils Relative 0 %   Basophils Absolute 0.1 0.0 - 0.1 K/uL   WBC Morphology DOHLE BODIES     Comment: MILD LEFT  SHIFT (1-5% METAS, OCC MYELO, OCC BANDS) TOXIC GRANULATION Performed at [place] [address] [place], [place] [address]   Lipase, blood     Status: None   Collection Time: 01/15/18  4:57 PM  Result Value Ref Range   Lipase 27 11 - 51 U/L    Comment: Performed at [facility], [place] [address], [place], [place] [address]  I-Stat beta hCG blood, ED     Status: None   Collection Time: 01/15/18  5:25 PM  Result Value Ref Range   I-stat hCG, quantitative <5.0 <5 mIU/mL   Comment 3            Comment:   GEST. AGE      CONC.  (mIU/mL)   <=1 WEEK        5 - 50     2 WEEKS       50 - 500     3 WEEKS       100 - 10,000     4 WEEKS     1,000 - 30,000        FEMALE AND NON-PREGNANT FEMALE:     LESS THAN 5 mIU/mL   I-Stat CG4 Lactic Acid, ED     Status: Abnormal   Collection Time: 01/15/18  5:27 PM  Result Value Ref Range   Lactic Acid, Venous 2.53 (HH) 0.5 - 1.9 mmol/L   Comment NOTIFIED PHYSICIAN   Urinalysis, Routine w reflex microscopic     Status: Abnormal   Collection Time: 01/15/18  7:05 PM  Result Value Ref Range   Color, Urine YELLOW YELLOW   APPearance HAZY (A) CLEAR   Specific Gravity, Urine 1.020 1.005 - 1.030   pH 6.0 5.0 - 8.0   Glucose, UA NEGATIVE NEGATIVE mg/dL   Hgb urine dipstick SMALL (A) NEGATIVE   Bilirubin Urine NEGATIVE NEGATIVE   Ketones, ur 5 (A) NEGATIVE mg/dL   Protein, ur 100 (A) NEGATIVE mg/dL   Nitrite NEGATIVE NEGATIVE   Leukocytes, UA NEGATIVE NEGATIVE   RBC / HPF 0-5 0 - 5 RBC/hpf   WBC, UA 0-5 0 - 5 WBC/hpf   Bacteria, UA NONE SEEN NONE SEEN   Squamous Epithelial / LPF 0-5 0 - 5   Mucus PRESENT     Comment: Performed  at [facility] [place] [address] [place], [place] [address]   Dg Chest Port 1 View  Result Date: 01/15/2018 CLINICAL DATA:  Pt reports generalized weakness, dizziness with n/v and h/a Sunday night. Went to a clinic yesterday for her h/a and was given 2 injections with  some relief. Her father reports slurred speech, LSN was 2 days ago. EXAM: PORTABLE CHEST 1 VIEW COMPARISON:  08/27/2016 FINDINGS: Shallow lung inflation. Heart size is normal. No focal consolidations or pleural effusions. No pulmonary edema. Thoracolumbar spinal fixation rods. IMPRESSION: No evidence for acute cardiopulmonary abnormality. Electronically Signed   By: Elizabeth  Brown M.D.   On: 01/15/2018 18:03   Us Abdomen Limited Ruq  Result Date: 01/15/2018 CLINICAL DATA:  Right upper quadrant pain x5 days. EXAM: ULTRASOUND ABDOMEN LIMITED RIGHT UPPER QUADRANT COMPARISON:  None. FINDINGS: Gallbladder: No gallstones or wall thickening visualized. No sonographic Murphy sign noted by sonographer. Common bile duct: Diameter: 2.7 mm Liver: No focal lesion identified. Within normal limits in parenchymal echogenicity. Portal vein is patent on color Doppler imaging with normal direction of blood flow towards the liver. Other: Relative to the liver, the right adjacent kidney is slightly echogenic but maintains its cortical-medullary distinction. The possibility of mild medical renal disease is not entirely excluded. IMPRESSION: Unremarkable right upper quadrant abdominal ultrasound. Slightly echogenic appearance of the right kidney relative to the liver, a nonspecific finding but can be seen in medical renal disease. Electronically Signed   By: David  Kwon M.D.   On: 01/15/2018 18:53    Pending Labs Unresulted Labs (From admission, onward)    Start     Ordered   01/15/18 2132  Magnesium  Add-on,   R     01/15/18 2131   01/15/18 2132  Osmolality  Once,   R     01/15/18 2131   01/15/18 2132  Osmolality, urine  Once,   R     01/15/18 2131   01/15/18 2132  Sodium, urine, random  Once,   R     01/15/18 2131   01/15/18 1656  Blood Culture (routine x 2)  BLOOD CULTURE X 2,   STAT     12 /27/19 1656          Vitals/Pain Today's Vitals   01/15/18 1900 01/15/18 1930 01/15/18 1948 01/15/18 2000  BP: 117/87  134/85  (!) 136/97  Pulse: (!) 113 (!) 115  (!) 119  Resp: 17 19  (!) 24  Temp:      TempSrc:      SpO2: 100% 100%  100%  PainSc:   4      Isolation Precautions No active isolations  Medications Medications  metroNIDAZOLE (FLAGYL) IVPB 500 mg (500 mg Intravenous New Bag/Given 01/15/18 1949)  vancomycin (VANCOCIN) IVPB 1000 mg/200 mL premix (has no administration in time range)  potassium chloride 10 mEq in 100 mL IVPB (has no administration in time range)  iohexol (OMNIPAQUE) 300 MG/ML solution 30 mL (has no administration in time range)  iopamidol (ISOVUE-300) 61 % injection (has no administration in time range)  sodium chloride (PF) 0.9 % injection (has no administration in time range)  sodium chloride 0.9 % bolus 1,000 mL (0 mLs Intravenous Stopped 01/15/18 1804)    And  sodium chloride 0.9 % bolus 500 mL (0 mLs Intravenous Stopped 01/15/18 2003)    And  sodium chloride 0.9 % bolus 250 mL (0 mLs Intravenous Stopped 01/15/18 1955)  ceFEPIme (MAXIPIME) 2 g in sodium chloride 0.9 %  100 mL IVPB (0 g Intravenous Stopped 01/15/18 1901)    Mobility walks

## 2018-01-15 NOTE — ED Notes (Signed)
Patient transported to CT 

## 2018-01-16 ENCOUNTER — Inpatient Hospital Stay (HOSPITAL_COMMUNITY): Payer: Medicaid Other

## 2018-01-16 ENCOUNTER — Other Ambulatory Visit: Payer: Self-pay

## 2018-01-16 DIAGNOSIS — G009 Bacterial meningitis, unspecified: Secondary | ICD-10-CM

## 2018-01-16 DIAGNOSIS — E871 Hypo-osmolality and hyponatremia: Secondary | ICD-10-CM

## 2018-01-16 DIAGNOSIS — E039 Hypothyroidism, unspecified: Secondary | ICD-10-CM

## 2018-01-16 DIAGNOSIS — E876 Hypokalemia: Secondary | ICD-10-CM

## 2018-01-16 DIAGNOSIS — Z888 Allergy status to other drugs, medicaments and biological substances status: Secondary | ICD-10-CM

## 2018-01-16 DIAGNOSIS — I351 Nonrheumatic aortic (valve) insufficiency: Secondary | ICD-10-CM

## 2018-01-16 DIAGNOSIS — R197 Diarrhea, unspecified: Secondary | ICD-10-CM

## 2018-01-16 DIAGNOSIS — F411 Generalized anxiety disorder: Secondary | ICD-10-CM

## 2018-01-16 DIAGNOSIS — M419 Scoliosis, unspecified: Secondary | ICD-10-CM

## 2018-01-16 DIAGNOSIS — R7881 Bacteremia: Secondary | ICD-10-CM

## 2018-01-16 DIAGNOSIS — Q231 Congenital insufficiency of aortic valve: Secondary | ICD-10-CM

## 2018-01-16 LAB — CSF CELL COUNT WITH DIFFERENTIAL
Lymphs, CSF: 6 % — ABNORMAL LOW (ref 40–80)
Lymphs, CSF: 8 % — ABNORMAL LOW (ref 40–80)
Monocyte-Macrophage-Spinal Fluid: 2 % — ABNORMAL LOW (ref 15–45)
Monocyte-Macrophage-Spinal Fluid: 3 % — ABNORMAL LOW (ref 15–45)
RBC COUNT CSF: 9 /mm3 — AB
RBC Count, CSF: 9 /mm3 — ABNORMAL HIGH
Segmented Neutrophils-CSF: 89 % — ABNORMAL HIGH (ref 0–6)
Segmented Neutrophils-CSF: 92 % — ABNORMAL HIGH (ref 0–6)
Tube #: 1
Tube #: 4
WBC, CSF: 28 /mm3 (ref 0–5)
WBC, CSF: 44 /mm3 (ref 0–5)

## 2018-01-16 LAB — CBC
HCT: 38.8 % (ref 36.0–46.0)
Hemoglobin: 13 g/dL (ref 12.0–15.0)
MCH: 28.6 pg (ref 26.0–34.0)
MCHC: 33.5 g/dL (ref 30.0–36.0)
MCV: 85.5 fL (ref 80.0–100.0)
Platelets: 138 10*3/uL — ABNORMAL LOW (ref 150–400)
RBC: 4.54 MIL/uL (ref 3.87–5.11)
RDW: 13.3 % (ref 11.5–15.5)
WBC: 31.4 10*3/uL — ABNORMAL HIGH (ref 4.0–10.5)
nRBC: 0 % (ref 0.0–0.2)

## 2018-01-16 LAB — BASIC METABOLIC PANEL
Anion gap: 11 (ref 5–15)
Anion gap: 10 (ref 5–15)
BUN: 12 mg/dL (ref 6–20)
BUN: 12 mg/dL (ref 6–20)
CO2: 20 mmol/L — ABNORMAL LOW (ref 22–32)
CO2: 23 mmol/L (ref 22–32)
Calcium: 6.9 mg/dL — ABNORMAL LOW (ref 8.9–10.3)
Calcium: 7.3 mg/dL — ABNORMAL LOW (ref 8.9–10.3)
Chloride: 99 mmol/L (ref 98–111)
Chloride: 102 mmol/L (ref 98–111)
Creatinine, Ser: 0.38 mg/dL — ABNORMAL LOW (ref 0.44–1.00)
Creatinine, Ser: 0.32 mg/dL — ABNORMAL LOW (ref 0.44–1.00)
GFR calc Af Amer: >60 mL/min (ref >60)
GFR calc Af Amer: >60 mL/min (ref >60)
GFR calc non Af Amer: >60 mL/min (ref >60)
Glucose, Bld: 114 mg/dL — ABNORMAL HIGH (ref 70–99)
Glucose, Bld: 158 mg/dL — ABNORMAL HIGH (ref 70–99)
Potassium: 3.8 mmol/L (ref 3.5–5.1)
Potassium: 3.9 mmol/L (ref 3.5–5.1)
Sodium: 130 mmol/L — ABNORMAL LOW (ref 135–145)
Sodium: 135 mmol/L (ref 135–145)

## 2018-01-16 LAB — COMPREHENSIVE METABOLIC PANEL
ALT: 30 U/L (ref 0–44)
AST: 29 U/L (ref 15–41)
Albumin: 2.2 g/dL — ABNORMAL LOW (ref 3.5–5.0)
Alkaline Phosphatase: 126 U/L (ref 38–126)
Anion gap: 14 (ref 5–15)
BUN: 13 mg/dL (ref 6–20)
CHLORIDE: 96 mmol/L — AB (ref 98–111)
CO2: 20 mmol/L — AB (ref 22–32)
Calcium: 6.9 mg/dL — ABNORMAL LOW (ref 8.9–10.3)
Creatinine, Ser: 0.4 mg/dL — ABNORMAL LOW (ref 0.44–1.00)
GFR calc Af Amer: >60 mL/min (ref >60)
GFR calc non Af Amer: >60 mL/min (ref >60)
Glucose, Bld: 120 mg/dL — ABNORMAL HIGH (ref 70–99)
Potassium: 3 mmol/L — ABNORMAL LOW (ref 3.5–5.1)
Sodium: 130 mmol/L — ABNORMAL LOW (ref 135–145)
Total Bilirubin: 0.7 mg/dL (ref 0.3–1.2)
Total Protein: 5.7 g/dL — ABNORMAL LOW (ref 6.5–8.1)

## 2018-01-16 LAB — BLOOD CULTURE ID PANEL (REFLEXED)
Acinetobacter baumannii: NOT DETECTED
Candida albicans: NOT DETECTED
Candida glabrata: NOT DETECTED
Candida krusei: NOT DETECTED
Candida parapsilosis: NOT DETECTED
Candida tropicalis: NOT DETECTED
Enterobacter cloacae complex: NOT DETECTED
Enterobacteriaceae species: NOT DETECTED
Enterococcus species: NOT DETECTED
Escherichia coli: NOT DETECTED
Haemophilus influenzae: NOT DETECTED
Klebsiella oxytoca: NOT DETECTED
Klebsiella pneumoniae: NOT DETECTED
LISTERIA MONOCYTOGENES: NOT DETECTED
NEISSERIA MENINGITIDIS: NOT DETECTED
Proteus species: NOT DETECTED
Pseudomonas aeruginosa: NOT DETECTED
STREPTOCOCCUS PYOGENES: NOT DETECTED
Serratia marcescens: NOT DETECTED
Staphylococcus aureus (BCID): NOT DETECTED
Staphylococcus species: NOT DETECTED
Streptococcus agalactiae: NOT DETECTED
Streptococcus pneumoniae: DETECTED — AB
Streptococcus species: DETECTED — AB

## 2018-01-16 LAB — HIV ANTIBODY (ROUTINE TESTING W REFLEX): HIV SCREEN 4TH GENERATION: NONREACTIVE

## 2018-01-16 LAB — OSMOLALITY: Osmolality: 266 mOsm/kg — ABNORMAL LOW (ref 275–295)

## 2018-01-16 LAB — ECHOCARDIOGRAM COMPLETE
Height: 60 in
Weight: 1760 oz

## 2018-01-16 LAB — C DIFFICILE QUICK SCREEN W PCR REFLEX
C Diff antigen: NEGATIVE
C Diff interpretation: NOT DETECTED
C Diff toxin: NEGATIVE

## 2018-01-16 LAB — OSMOLALITY, URINE: Osmolality, Ur: 658 mOsm/kg (ref 300–900)

## 2018-01-16 LAB — PROTEIN AND GLUCOSE, CSF
Glucose, CSF: <20 mg/dL — CL (ref 40–70)
TOTAL PROTEIN, CSF: 296 mg/dL — AB (ref 15–45)

## 2018-01-16 LAB — MRSA PCR SCREENING: MRSA by PCR: NEGATIVE

## 2018-01-16 MED ORDER — FAMOTIDINE 20 MG PO TABS
20.0000 mg | ORAL_TABLET | Freq: Two times a day (BID) | ORAL | Status: DC
  Administered 2018-01-16 – 2018-01-20 (×7): 20 mg via ORAL
  Filled 2018-01-16 (×8): qty 1

## 2018-01-16 MED ORDER — VANCOMYCIN HCL 10 G IV SOLR
1250.0000 mg | INTRAVENOUS | Status: AC
  Administered 2018-01-16: 1250 mg via INTRAVENOUS
  Filled 2018-01-16: qty 1250

## 2018-01-16 MED ORDER — POTASSIUM CHLORIDE IN NACL 40-0.9 MEQ/L-% IV SOLN
INTRAVENOUS | Status: DC
  Administered 2018-01-16: 50 mL/h via INTRAVENOUS
  Filled 2018-01-16 (×2): qty 1000

## 2018-01-16 MED ORDER — VANCOMYCIN HCL IN DEXTROSE 750-5 MG/150ML-% IV SOLN
750.0000 mg | Freq: Two times a day (BID) | INTRAVENOUS | Status: DC
  Administered 2018-01-16 – 2018-01-19 (×6): 750 mg via INTRAVENOUS
  Filled 2018-01-16 (×7): qty 150

## 2018-01-16 MED ORDER — HYDRALAZINE HCL 20 MG/ML IJ SOLN: 5.0000 mg | Freq: Four times a day (QID) | INTRAMUSCULAR | Status: DC | PRN

## 2018-01-16 MED ORDER — CHLORHEXIDINE GLUCONATE 0.12 % MT SOLN
15.0000 mL | Freq: Two times a day (BID) | OROMUCOSAL | Status: DC
  Administered 2018-01-17 – 2018-01-20 (×5): 15 mL via OROMUCOSAL
  Filled 2018-01-16 (×7): qty 15

## 2018-01-16 MED ORDER — DEXAMETHASONE SODIUM PHOSPHATE 10 MG/ML IJ SOLN
10.0000 mg | Freq: Four times a day (QID) | INTRAMUSCULAR | Status: AC
  Administered 2018-01-16 – 2018-01-20 (×16): 10 mg via INTRAVENOUS
  Filled 2018-01-16 (×16): qty 1

## 2018-01-16 MED ORDER — POTASSIUM CHLORIDE 10 MEQ/100ML IV SOLN
10.0000 meq | INTRAVENOUS | Status: AC
  Administered 2018-01-16 (×3): 10 meq via INTRAVENOUS
  Filled 2018-01-16 (×3): qty 100

## 2018-01-16 MED ORDER — ORAL CARE MOUTH RINSE: 15.0000 mL | Freq: Two times a day (BID) | OROMUCOSAL | Status: DC

## 2018-01-16 MED ORDER — PERFLUTREN LIPID MICROSPHERE
1.0000 mL | INTRAVENOUS | Status: AC | PRN
  Administered 2018-01-16: 2 mL via INTRAVENOUS
  Filled 2018-01-16: qty 10

## 2018-01-16 NOTE — Progress Notes (Signed)
Received verbal order from Dr Baxter Flattery to place pt on enteric precautions and check stool for c-diff

## 2018-01-16 NOTE — Progress Notes (Addendum)
PROGRESS NOTE   Emma Martin  ZOX:[id number]    DOB: 1980-08-04    DOA: 01/15/2018  PCP: Maurice Small, MD   I have briefly reviewed patients previous medical records in [facility].  Brief Narrative:  37 year old female patient, lives with her father, independent, PMH of Turner syndrome, HTN, hypothyroid, bicuspid aortic valve, GAD, presented to [facility] ED on 01/15/2018 with 1 week history of headache and neck pain followed several days later by nausea, vomiting, dizziness, generalized weakness and malaise, mild diarrhea and RUQ pain, dyspnea, denied fever but had chills.  Hypothermic/temperature 96.6 rectally in ED, marked neutrophilic [id number]/21.3, positive for neck stiffness.  Admitted to stepdown unit for suspected acute bacterial meningitis.  Status post LP by EDP.  ID consulted 12/28.   Assessment & Plan:   Principal Problem:   Sepsis ([place]) Active Problems:   HTN (hypertension)   Hypothyroidism   Generalized anxiety disorder   Hyponatremia   Hypokalemia   1. Sepsis due to bacteremia (gram-positive cocci) and acute bacterial meningitis: Met sepsis criteria on admission (hypothermia, tachycardia, leukocytosis, neck stiffness).  Urine microscopy, chest x-ray, CT abdomen and pelvis with contrast and RUQ ultrasound without acute findings.  MRSA PCR negative.  Status post LP by EDP on night of admission.  CSF results appreciated, Gram stain shows gram-positive cocci, final cultures pending.  RBC 8, WBC 40s with predominantly neutrophilic, low glucose and elevated protein.  1 of 2 blood cultures positive for gram-positive cocci and CSF Gram stain shows gram-positive cocci.  CT head without acute findings.  She received a dose of cefepime and metronidazole prior to CSF collection.  Currently on meningitic doses of IV ceftriaxone and vancomycin, continue. Added IV Decadron 10 mg every 6 hourly x4 days. Check TTE and may need TEE.  Continue droplet precautions pending ID  input. ID consulted and discussed with Dr. Baxter Flattery. 2. Hypokalemia: Replaced.  Magnesium 2.3. 3. Hyponatremia: May be due to dehydration due to poor oral intake, GI losses, sepsis and due to HCTZ.  Presented with sodium of 125 which is improved to 130.  Continue gentle IV saline hydration and aim to correct no more than 10 mEq in a 24-hour.. 4. Leukocytosis: Secondary to sepsis.  WBC is improved from 41-31.  Follow daily CBCs. 5. Thrombocytopenia: Likely related to sepsis.  No bleeding reported.  Follow CBC in a.m. 6. Essential hypertension: Held lisinopril-HCTZ.  IV PRN hydralazine. 7. Hypothyroid: Continue Synthroid. 8. Generalized anxiety disorder: Fluoxetine temporarily held due to hyponatremia, consider resuming in a.m. 12/29. 9. Nausea, vomiting and diarrhea:?  Related to sepsis picture.  Monitor diarrhea closely i.e. frequency, volume and consistency regarding need for further evaluation i.e C. difficile testing. 10. Left kidney mass: Noted on CT abdomen and pelvis with contrast.  Outpatient follow-up with renal MRI.   DVT prophylaxis: SCD Code Status: Full Family Communication: None at bedside Disposition: Admitted to stepdown unit, continue care here due to need for close monitoring and management.  DC home when medically improved.   Consultants:  ID  Procedures:  LP by EDP on night of admission  Antimicrobials:  IV Flagyl x1 dose IV cefepime x1 dose IV vancomycin and ceftriaxone 12/27 >   Subjective: Patient interviewed and examined along with female RN in room.  Reports feeling slightly better.  Ongoing headache and neck pain, rates 4/10 in severity.  Feels thirsty and willing to drink something.  Low mid back pain.  Mild diarrhea as per RN.  ROS: As above, otherwise  negative.  Objective:  Vitals:   01/16/18 0600 01/16/18 0700 01/16/18 0730 01/16/18 0800  BP: 135/86 133/84  (!) 142/85  Pulse: (!) 116 (!) 114  (!) 109  Resp: 18 (!) 25  (!) 22  Temp:   97.7 F (36.5  C)   TempSrc:   Oral   SpO2: 97% 99%  97%  Weight:      Height:        Examination:  General exam: Pleasant young female, small built and moderately nourished lying comfortably propped up in bed without distress.  Oral mucosa dry. Respiratory system: Clear to auscultation. Respiratory effort normal. Cardiovascular system: S1 & S2 heard, RRR. No JVD, murmurs, rubs, gallops or clicks. No pedal edema. Gastrointestinal system: Abdomen is nondistended, soft and nontender. No organomegaly or masses felt. Normal bowel sounds heard. Central nervous system: Alert and oriented to person, place and partly to time. No focal neurological deficits.  Neck stiffness + + Extremities: Symmetric 5 x 5 power. Skin: Minimal superficial bruising of bilateral MCP joints with mild faint patchy redness of left dorsum of hand without any other acute findings Psychiatry: Judgement and insight appear impaired. Mood & affect appropriate.     Data Reviewed: I have personally reviewed following labs and imaging studies  CBC: Recent Labs  Lab 01/15/18 1639 01/16/18 0327  WBC 41.2* 31.4*  NEUTROABS 34.6*  --   HGB 15.0 13.0  HCT 44.5 38.8  MCV 85.2 85.5  PLT 147* 099*   Basic Metabolic Panel: Recent Labs  Lab 01/15/18 1639 01/15/18 1657 01/16/18 0327 01/16/18 0656  NA 125*  --  130* 130*  K 2.9*  --  3.0* 3.8  CL 85*  --  96* 99  CO2 26  --  20* 20*  GLUCOSE 177*  --  120* 114*  BUN 23*  --  13 12  CREATININE 0.57  --  0.40* 0.38*  CALCIUM 8.2*  --  6.9* 6.9*  MG  --  2.3  --   --    Liver Function Tests: Recent Labs  Lab 01/15/18 1639 01/16/18 0327  AST 34 29  ALT 36 30  ALKPHOS 139* 126  BILITOT 0.9 0.7  PROT 7.3 5.7*  ALBUMIN 3.0* 2.2*     Recent Results (from the past 240 hour(s))  Blood Culture (routine x 2)     Status: None (Preliminary result)   Collection Time: 01/15/18  4:56 PM  Result Value Ref Range Status   Specimen Description   Final    BLOOD LEFT  ANTECUBITAL Performed at [facility] [address], [place], [place] [address]    Special Requests   Final    BOTTLES DRAWN AEROBIC AND ANAEROBIC Blood Culture adequate volume Performed at [facility] [address], [place], [place] [address]    Culture  Setup Time   Final    GRAM POSITIVE COCCI IN BOTH AEROBIC AND ANAEROBIC BOTTLES    Culture   Final    NO GROWTH < 12 HOURS Performed at [facility], [place] [address], [place], [place] [address]    Report Status PENDING  Incomplete  Blood Culture (routine x 2)     Status: None (Preliminary result)   Collection Time: 01/15/18  5:01 PM  Result Value Ref Range Status   Specimen Description   Final    BLOOD LEFT ANTECUBITAL Performed at [facility] [address], [place], [place] [address]    Special Requests  Final    BOTTLES DRAWN AEROBIC AND ANAEROBIC Blood Culture adequate volume Performed at [facility] [address] [place], [place] [address]    Culture  Setup Time   Final    GRAM POSITIVE COCCI IN BOTH AEROBIC AND ANAEROBIC BOTTLES CRITICAL RESULT CALLED TO, READ BACK BY AND VERIFIED WITH: RN [place], [place] [id number] FCP Performed at [facility], [place] [address], [place], [place] [address]    Culture GRAM POSITIVE COCCI  Final   Report Status PENDING  Incomplete  CSF culture     Status: None (Preliminary result)   Collection Time: 01/15/18 11:51 PM  Result Value Ref Range Status   Specimen Description CSF  Final   Special Requests NONE  Final   Gram Stain   Final    WBC PRESENT, PREDOMINANTLY PMN GRAM POSITIVE COCCI CRITICAL RESULT CALLED TO, READ BACK BY AND VERIFIED WITH: ANN HAWKINS AT 0345 ON 01/16/18 BY A,MOHAMED  CYTOSPIN SMEAR Performed at [facility], [place] [address], [place], [place] [address]    Culture PENDING  Incomplete   Report Status PENDING  Incomplete  MRSA PCR Screening     Status:  None   Collection Time: 01/16/18 12:53 AM  Result Value Ref Range Status   MRSA by PCR NEGATIVE NEGATIVE Final    Comment:        The GeneXpert MRSA Assay (FDA approved for NASAL specimens only), is one component of a comprehensive MRSA colonization surveillance program. It is not intended to diagnose MRSA infection nor to guide or monitor treatment for MRSA infections. Performed at [facility], [place] [address], [place], [place] [address]          Radiology Studies: Ct Head Wo Contrast  Result Date: 01/15/2018 CLINICAL DATA:  Initial evaluation for acute headache, falls. EXAM: CT HEAD WITHOUT CONTRAST CT CERVICAL SPINE WITHOUT CONTRAST TECHNIQUE: Multidetector CT imaging of the head and cervical spine was performed following the standard protocol without intravenous contrast. Multiplanar CT image reconstructions of the cervical spine were also generated. COMPARISON:  None. FINDINGS: CT HEAD FINDINGS Brain: Cerebral volume within normal limits for patient age. No evidence for acute intracranial hemorrhage. No findings to suggest acute large vessel territory infarct. No mass lesion, midline shift, or mass effect. Ventricles are normal in size without evidence for hydrocephalus. No extra-axial fluid collection identified. Vascular: No hyperdense vessel identified. Skull: Scalp soft tissues demonstrate no acute abnormality. Calvarium intact. Sinuses/Orbits: Globes and orbital soft tissues within normal limits. Moderate mucosal thickening and opacity within the ethmoidal air cells and sphenoid sinuses. Paranasal sinuses are otherwise clear. No mastoid effusion. CT CERVICAL SPINE FINDINGS Alignment: Straightening of the normal cervical lordosis. No listhesis or malalignment. Skull base and vertebrae: Skull base intact. Normal C1-2 articulations are preserved in the dens is intact. Vertebral body heights maintained. No acute fracture. Soft tissues and spinal canal: Soft tissues  of the neck demonstrate no acute finding. No abnormal prevertebral edema. Spinal canal within normal limits. Disc levels: Mild degenerative spondylolysis present at C4-5 and C5-6. No significant spinal stenosis within the cervical spine. Thoracic fusion hardware partially visualized. Upper chest: Visualized upper chest limited evaluation due to streak artifact from thoracic hardware. No obvious abnormality identified. Other: None. IMPRESSION: CT BRAIN: 1. No acute intracranial abnormality. 2. Moderate sphenoid ethmoidal sinus disease. CT CERVICAL SPINE: No CT evidence for acute traumatic injury within the cervical spine. Electronically Signed   By: Pincus Badder.D.  On: 01/15/2018 23:10   Ct Cervical Spine Wo Contrast  Result Date: 01/15/2018 CLINICAL DATA:  Initial evaluation for acute headache, falls. EXAM: CT HEAD WITHOUT CONTRAST CT CERVICAL SPINE WITHOUT CONTRAST TECHNIQUE: Multidetector CT imaging of the head and cervical spine was performed following the standard protocol without intravenous contrast. Multiplanar CT image reconstructions of the cervical spine were also generated. COMPARISON:  None. FINDINGS: CT HEAD FINDINGS Brain: Cerebral volume within normal limits for patient age. No evidence for acute intracranial hemorrhage. No findings to suggest acute large vessel territory infarct. No mass lesion, midline shift, or mass effect. Ventricles are normal in size without evidence for hydrocephalus. No extra-axial fluid collection identified. Vascular: No hyperdense vessel identified. Skull: Scalp soft tissues demonstrate no acute abnormality. Calvarium intact. Sinuses/Orbits: Globes and orbital soft tissues within normal limits. Moderate mucosal thickening and opacity within the ethmoidal air cells and sphenoid sinuses. Paranasal sinuses are otherwise clear. No mastoid effusion. CT CERVICAL SPINE FINDINGS Alignment: Straightening of the normal cervical lordosis. No listhesis or malalignment.  Skull base and vertebrae: Skull base intact. Normal C1-2 articulations are preserved in the dens is intact. Vertebral body heights maintained. No acute fracture. Soft tissues and spinal canal: Soft tissues of the neck demonstrate no acute finding. No abnormal prevertebral edema. Spinal canal within normal limits. Disc levels: Mild degenerative spondylolysis present at C4-5 and C5-6. No significant spinal stenosis within the cervical spine. Thoracic fusion hardware partially visualized. Upper chest: Visualized upper chest limited evaluation due to streak artifact from thoracic hardware. No obvious abnormality identified. Other: None. IMPRESSION: CT BRAIN: 1. No acute intracranial abnormality. 2. Moderate sphenoid ethmoidal sinus disease. CT CERVICAL SPINE: No CT evidence for acute traumatic injury within the cervical spine. Electronically Signed   By: Jeannine Boga M.D.   On: 01/15/2018 23:10   Ct Abdomen Pelvis W Contrast  Result Date: 01/15/2018 CLINICAL DATA:  Status post fall few days ago with acute generalized abdomen pain EXAM: CT ABDOMEN AND PELVIS WITH CONTRAST TECHNIQUE: Multidetector CT imaging of the abdomen and pelvis was performed using the standard protocol following bolus administration of intravenous contrast. CONTRAST:  141m ISOVUE-300 IOPAMIDOL (ISOVUE-300) INJECTION 61%, 357mOMNIPAQUE IOHEXOL 300 MG/ML SOLN COMPARISON:  None. FINDINGS: Lower chest: Minimal bilateral pleural effusions are noted. Hepatobiliary: Patchy low density is identified near the falciform ligament probably due to focal fatty infiltration of liver. The liver is otherwise normal. The gallbladder is normal. The biliary tree is normal. Pancreas: Unremarkable. No pancreatic ductal dilatation or surrounding inflammatory changes. Spleen: Normal in size without focal abnormality. Adrenals/Urinary Tract: The bilateral adrenal glands are normal. The right kidney is normal. In the lower pole left kidney, there is a  heterogeneous enhancing mass measuring 2.1 x 2.7 cm. There is no hydronephrosis bilaterally. The bladder is normal. Stomach/Bowel: Stomach is within normal limits. The appendix is not definitely seen but no inflammation is noted around cecum. No evidence of bowel wall thickening, distention, or inflammatory changes. Vascular/Lymphatic: No significant vascular findings are present. No enlarged abdominal or pelvic lymph nodes. Reproductive: Uterus and bilateral adnexa are unremarkable. Other: None. Musculoskeletal: Patient status post prior fixation of thoracic and upper lumbar spine. IMPRESSION: No acute abnormality identified in the abdomen pelvis. Heterogeneous enhancing mass in the lower pole left kidney measuring 2.1 x 2.7 cm. Recommend further evaluation with renal MRI on outpatient basis as neoplasm of the kidney is not excluded. Electronically Signed   By: WeAbelardo Diesel.D.   On: 01/15/2018 22:37   [facility]  [facility]  Result Date: 01/15/2018 CLINICAL DATA:  Pt reports generalized weakness, dizziness with n/v and h/a Sunday night. Went to a clinic yesterday for her h/a and was given 2 injections with some relief. Her father reports slurred speech, LSN was 2 days ago. EXAM: PORTABLE CHEST 1 VIEW COMPARISON:  08/27/2016 FINDINGS: Shallow lung inflation. Heart size is normal. No focal consolidations or pleural effusions. No pulmonary edema. Thoracolumbar spinal fixation rods. IMPRESSION: No evidence for acute cardiopulmonary abnormality. Electronically Signed   By: Nolon Nations M.D.   On: 01/15/2018 18:03   US Abdomen Limited Ruq  Result Date: 01/15/2018 CLINICAL DATA:  Right upper quadrant pain x5 days. EXAM: ULTRASOUND ABDOMEN LIMITED RIGHT UPPER QUADRANT COMPARISON:  None. FINDINGS: Gallbladder: No gallstones or wall thickening visualized. No sonographic Murphy sign noted by sonographer. Common bile duct: Diameter: 2.7 mm Liver: No focal lesion identified. Within normal limits in parenchymal  echogenicity. Portal vein is patent on color Doppler imaging with normal direction of blood flow towards the liver. Other: Relative to the liver, the right adjacent kidney is slightly echogenic but maintains its cortical-medullary distinction. The possibility of mild medical renal disease is not entirely excluded. IMPRESSION: Unremarkable right upper quadrant abdominal ultrasound. Slightly echogenic appearance of the right kidney relative to the liver, a nonspecific finding but can be seen in medical renal disease. Electronically Signed   By: Ashley Royalty M.D.   On: 01/15/2018 18:53        Scheduled Meds: . chlorhexidine  15 mL Mouth Rinse BID  . iopamidol      . levothyroxine  75 mcg Oral QAC breakfast  . mouth rinse  15 mL Mouth Rinse q12n4p  . sodium chloride (PF)       Continuous Infusions: . 0.9 % NaCl with KCl 40 mEq / L 75 mL/hr at 01/16/18 0700  . cefTRIAXone (ROCEPHIN)  IV Stopped (01/16/18 0304)  . vancomycin       LOS: 1 day     Vernell Leep, MD, FACP, [facility]. [facility] Pager [phone]  If 7PM-7AM, please contact night-coverage [email or web address] Password [facility] 01/16/2018, 8:23 AM

## 2018-01-16 NOTE — Progress Notes (Signed)
Notified Dr Algis Liming that pt has had 3 type 7 bowel movements today, pt denies abdominal cramping , is afebrile and is not taking a stool softener.

## 2018-01-16 NOTE — Progress Notes (Signed)
C-diff tested negative, enteric precautions d/c'd

## 2018-01-16 NOTE — Progress Notes (Signed)
Pt Alert and oriented x4 w/ intermittent confusion. Easily redirectable. Will continue to monitor

## 2018-01-16 NOTE — Progress Notes (Signed)
CRITICAL VALUE ALERT  Critical Value:  Gram positive cocci  Date & Time Notied:  12/28 0730a  Provider Notified: paged Hongalgi  Orders Received/Actions taken: no new orders received

## 2018-01-16 NOTE — Consult Note (Signed)
[facility]  Total days of antibiotics 2        Day 2 vanco/1 ceftriaxone         Reason for Consult: meningitis    Referring Physician: Refujio Haymer  Principal Problem:   Sepsis ([place]) Active Problems:   HTN (hypertension)   Hypothyroidism   Generalized anxiety disorder   Hyponatremia   Hypokalemia    HPI: Emma Martin is a 37 y.o. female wit HTN, hypothyroidism, GAD, scoliosis with harrington rod placements, mixed gonadal dysgenesis, bicuspid AV who was admitted for 1 wk history of headache, neck pain, nightsweats followed by a few days of N/V, dizziness and malaise. She was admitted on 12/27 where she was found to be ill appearing, in discomfort with nuchal rigidity and mild abdominal discomfort on exam. Her family reports that she was seen at [facility] on Thursday and given injection for migraine management.   VS were stable, afebrile but her labs were remarkable for 41K WBC with left shift, sodium of 125, potassium of 2.9. LA of 2.53. She underwent LP for concern of meningitis, after she had received a dose of abtx. CSF profile showed mild pleocytosis with neutrophilic predominance, low glucose and high protein. OP of 20. wBC of 44 with 89%N, TP 296, glu <20 and gram stain showing GPC. Other infectious work up revealed blood cx + growth <12 hrs with GPC found to be identified as strep pneumoniae. She is on vancomycin, ceftriaxone, plus recent addition of dexamethasone. Her labs showing some improvement to 31.4K. had loose stools on day prior to admit, and 3 loose stools today per RN report   Past Medical History:  Diagnosis Date  . Bicuspid aortic valve    , mild aortic insufficiency. (No SBE prophylaxis needed)  . Dyslipidemia   . Generalized anxiety disorder    , with obsessive-compulsive traits.  . Hypertension   . Hypothyroidism   . Mixed gonadal dysgenesis    , (additional Y-bearing cell line) for which she reportedly underwent bilateral gonadectomy  (patient unaware; needs confirmation) due to malignancy potential.  . Primary amenorrhea    , treated starting at age 79 or 51 with estrogen and progesterone  . Scoliosis    , treated with Harrington rods (1997) for stabilization.  . Seasonal allergic rhinitis   . Short stature    , previously treated with growth hormone age 85-15 years.  Radford Pax syndrome    , previously followed by pediatric endocrinologist Freddy Jaksch, MD at [facility] through her early 26s).    Allergies:  Allergies  Allergen Reactions  . Benzoin Rash     MEDICATIONS: . chlorhexidine  15 mL Mouth Rinse BID  . levothyroxine  75 mcg Oral QAC breakfast  . mouth rinse  15 mL Mouth Rinse q12n4p    Social History   Tobacco Use  . Smoking status: Never Smoker  . Smokeless tobacco: Never Used  Substance Use Topics  . Alcohol use: Yes    Alcohol/week: 1.0 - 2.0 standard drinks    Types: 1 - 2 Glasses of wine per week    Comment: Occasionally  . Drug use: No  - currently not employed. Lives with dad. Attends church  Family History  Problem Relation Age of Onset  . Other Father        A + W  . Cancer Maternal Grandmother   . Cancer Paternal Grandfather   . Hypertension Mother   . Other Mother  Dyslipidemia  . Hypothyroidism Mother   . Mitral valve prolapse Mother     Review of Systems -  Unable to obtain since patient is solomnent  OBJECTIVE: Temp:  [96.6 F (35.9 C)-98.2 F (36.8 C)] 97.7 F (36.5 C) (12/28 0730) Pulse Rate:  [102-122] 109 (12/28 0800) Resp:  [14-26] 22 (12/28 0800) BP: (104-142)/(73-97) 142/85 (12/28 0800) SpO2:  [97 %-100 %] 97 % (12/28 0800) Weight:  [49.9 kg] 49.9 kg (12/28 0011) Physical Exam  Constitutional:  oriented to person, place, and time. appears well-developed and well-nourished. No distress.  HENT: [place]/AT, PERRLA, no scleral icterus, flushed Mouth/Throat: Oropharynx is clear and moist. No oropharyngeal exudate.  Cardiovascular: Normal rate,  regular rhythm and normal heart sounds. Exam reveals no gallop and no friction rub.  No murmur heard.  Pulmonary/Chest: Effort normal and breath sounds normal. No respiratory distress.  has no wheezes.  Neck = supple, with  nuchal rigidity Abdominal: Soft. Bowel sounds are decreased  exhibits no distension. There is no tenderness.  Lymphadenopathy: no cervical adenopathy. No axillary adenopathy Neurological: alert and oriented to person, place, and time.  Skin: Skin is warm and dry. No rash noted. No erythema. Mild abrasions to hands bilaterally Psychiatric: sleeping   LABS: Results for orders placed or performed during the hospital encounter of 01/15/18 (from the past 48 hour(s))  Comprehensive metabolic panel     Status: Abnormal   Collection Time: 01/15/18  4:39 PM  Result Value Ref Range   Sodium 125 (L) 135 - 145 mmol/L   Potassium 2.9 (L) 3.5 - 5.1 mmol/L   Chloride 85 (L) 98 - 111 mmol/L   CO2 26 22 - 32 mmol/L   Glucose, Bld 177 (H) 70 - 99 mg/dL   BUN 23 (H) 6 - 20 mg/dL   Creatinine, Ser 0.57 0.44 - 1.00 mg/dL   Calcium 8.2 (L) 8.9 - 10.3 mg/dL   Total Protein 7.3 6.5 - 8.1 g/dL   Albumin 3.0 (L) 3.5 - 5.0 g/dL   AST 34 15 - 41 U/L   ALT 36 0 - 44 U/L   Alkaline Phosphatase 139 (H) 38 - 126 U/L   Total Bilirubin 0.9 0.3 - 1.2 mg/dL   GFR calc non Af Amer >60 >60 mL/min   GFR calc Af Amer >60 >60 mL/min   Anion gap 14 5 - 15    Comment: Performed at [facility], [place] [address] [place], [place] [address]  CBC     Status: Abnormal   Collection Time: 01/15/18  4:39 PM  Result Value Ref Range   WBC 41.2 (H) 4.0 - 10.5 K/uL    Comment: WHITE COUNT CONFIRMED ON SMEAR   RBC 5.22 (H) 3.87 - 5.11 MIL/uL   Hemoglobin 15.0 12.0 - 15.0 g/dL   HCT 44.5 36.0 - 46.0 %   MCV 85.2 80.0 - 100.0 fL   MCH 28.7 26.0 - 34.0 pg   MCHC 33.7 30.0 - 36.0 g/dL   RDW 13.3 11.5 - 15.5 %   Platelets 147 (L) 150 - 400 K/uL   nRBC 0.0 0.0 - 0.2 %    Comment: Performed at  [facility], [place] [address] [place],  [address]  Differential     Status: Abnormal   Collection Time: 01/15/18  4:39 PM  Result Value Ref Range   Neutrophils Relative % 86 %   Neutro Abs 34.6 (H) 1.7 - 7.7 K/uL   Lymphocytes Relative 4 %   Lymphs  Abs 1.5 0.7 - 4.0 K/uL   Monocytes Relative 4 %   Monocytes Absolute 1.5 (H) 0.1 - 1.0 K/uL   Eosinophils Relative 0 %   Eosinophils Absolute 0.0 0.0 - 0.5 K/uL   Basophils Relative 0 %   Basophils Absolute 0.1 0.0 - 0.1 K/uL   WBC Morphology DOHLE BODIES     Comment: MILD LEFT SHIFT (1-5% METAS, OCC MYELO, OCC BANDS) TOXIC GRANULATION Performed at [place] [place] [address], [place], [place] [address]   Blood Culture (routine x 2)     Status: None (Preliminary result)   Collection Time: 01/15/18  4:56 PM  Result Value Ref Range   Specimen Description      BLOOD LEFT ANTECUBITAL Performed at [facility] [address] [place], [place] [address]    Special Requests      BOTTLES DRAWN AEROBIC AND ANAEROBIC Blood Culture adequate volume Performed at [facility] [address] [place], [place] [address]    Culture  Setup Time      GRAM POSITIVE COCCI IN BOTH AEROBIC AND ANAEROBIC BOTTLES    Culture      NO GROWTH < 12 HOURS Performed at [facility], [place] [address], [place], [place] [address]    Report Status PENDING   Lipase, blood     Status: None   Collection Time: 01/15/18  4:57 PM  Result Value Ref Range   Lipase 27 11 - 51 U/L    Comment: Performed at [facility], [place] [address] [place], [place] [address]  Magnesium     Status: None   Collection Time: 01/15/18  4:57 PM  Result Value Ref Range   Magnesium 2.3 1.7 - 2.4 mg/dL    Comment: Performed at [facility], [place] [address] [place], [place] [address]  Osmolality     Status: Abnormal   Collection Time: 01/15/18  4:57 PM  Result  Value Ref Range   Osmolality 266 (L) 275 - 295 mOsm/kg    Comment: Performed at [facility], [place] [address] [place], [place] [address]  Blood Culture (routine x 2)     Status: None (Preliminary result)   Collection Time: 01/15/18  5:01 PM  Result Value Ref Range   Specimen Description      BLOOD LEFT ANTECUBITAL Performed at [facility] [address], [place], [place] [address]    Special Requests      BOTTLES DRAWN AEROBIC AND ANAEROBIC Blood Culture adequate volume Performed at [facility] [address] [place], [place] [address]    Culture  Setup Time      GRAM POSITIVE COCCI IN BOTH AEROBIC AND ANAEROBIC BOTTLES CRITICAL RESULT CALLED TO, READ BACK BY AND VERIFIED WITH: RN [place], [place] [id number] FCP Performed at [facility], [place] [address] [place], [place] [address]    Culture GRAM POSITIVE COCCI    Report Status PENDING   I-Stat beta hCG blood, ED     Status: None   Collection Time: 01/15/18  5:25 PM  Result Value Ref Range   I-stat hCG, quantitative <5.0 <5 mIU/mL   Comment 3            Comment:   GEST. AGE      CONC.  (mIU/mL)   <=1 WEEK        5 - 50     2 WEEKS  50 - 500     3 WEEKS       100 - 10,000     4 WEEKS     1,000 - 30,000        FEMALE AND NON-PREGNANT FEMALE:     LESS THAN 5 mIU/mL   I-Stat CG4 Lactic Acid, ED     Status: Abnormal   Collection Time: 01/15/18  5:27 PM  Result Value Ref Range   Lactic Acid, Venous 2.53 (HH) 0.5 - 1.9 mmol/L   Comment NOTIFIED PHYSICIAN   Urinalysis, Routine w reflex microscopic     Status: Abnormal   Collection Time: 01/15/18  7:05 PM  Result Value Ref Range   Color, Urine YELLOW YELLOW   APPearance HAZY (A) CLEAR   Specific Gravity, Urine 1.020 1.005 - 1.030   pH 6.0 5.0 - 8.0   Glucose, UA NEGATIVE NEGATIVE mg/dL   Hgb urine dipstick SMALL (A) NEGATIVE   Bilirubin Urine NEGATIVE NEGATIVE   Ketones, ur 5 (A) NEGATIVE mg/dL   Protein, ur 100 (A)  NEGATIVE mg/dL   Nitrite NEGATIVE NEGATIVE   Leukocytes, UA NEGATIVE NEGATIVE   RBC / HPF 0-5 0 - 5 RBC/hpf   WBC, UA 0-5 0 - 5 WBC/hpf   Bacteria, UA NONE SEEN NONE SEEN   Squamous Epithelial / LPF 0-5 0 - 5   Mucus PRESENT     Comment: Performed at [facility], [place] [address], [place], [place] [address]  Osmolality, urine     Status: None   Collection Time: 01/15/18  9:32 PM  Result Value Ref Range   Osmolality, Ur 658 300 - 900 mOsm/kg    Comment: Performed at [facility] [address], [place], [place] [address]  Sodium, urine, random     Status: None   Collection Time: 01/15/18  9:32 PM  Result Value Ref Range   Sodium, Ur <10 mmol/L    Comment: Performed at [facility], [place] [address] [place], [place] [address]  CSF cell count with differential collection tube #: 1     Status: Abnormal   Collection Time: 01/15/18 11:51 PM  Result Value Ref Range   Tube # 4     Comment: CORRECTED ON 12/28 AT 7017: PREVIOUSLY REPORTED AS 1   Color, CSF STRAW (A) COLORLESS   Appearance, CSF CLEAR CLEAR   Supernatant XANTHOCHROMIC    RBC Count, CSF 9 (H) 0 /cu mm   WBC, CSF 28 (HH) 0 - 5 /cu mm    Comment: CRITICAL RESULT CALLED TO, READ BACK BY AND VERIFIED WITH: ANN,HAWKINS AT 0345 ON 01/17/18 BY A,MOHAMED    Segmented Neutrophils-CSF 92 (H) 0 - 6 %   Lymphs, CSF 6 (L) 40 - 80 %   Monocyte-Macrophage-Spinal Fluid 2 (L) 15 - 45 %   Other Cells, CSF      INTRA AND EXTRACELLULAR ORGANISMS PRESENT CORRELATE WITH MICRBIOLOGY    Comment: SENT FOR PATHOLOGY REVIEW TUBE NO 4 RESULT CALLED TO, READ BACK BY AND VERIFIED WITH: ANN, HAWKINS AT 0345 ON 01/17/18 BY A,MOHAMED Performed at [facility], [place] [address], [place],  [address]   CSF cell count with differential collection tube #: 4     Status: Abnormal   Collection Time: 01/15/18 11:51 PM  Result Value Ref Range   Tube # 1     Comment: CORRECTED ON 12/28 AT  3009: PREVIOUSLY REPORTED AS 4   Color, CSF STRAW (A)  COLORLESS   Appearance, CSF CLEAR CLEAR   Supernatant XANTHOCHROMIC    RBC Count, CSF 9 (H) 0 /cu mm   WBC, CSF 44 (HH) 0 - 5 /cu mm    Comment: CRITICAL RESULT CALLED TO, READ BACK BY AND VERIFIED WITH: ANN,HAWKINS AT 0240 ON 01/16/18 BY A,MOHAMED    Segmented Neutrophils-CSF 89 (H) 0 - 6 %   Lymphs, CSF 8 (L) 40 - 80 %   Monocyte-Macrophage-Spinal Fluid 3 (L) 15 - 45 %   Other Cells, CSF      ITRA AND EXTRACELLULAR ORGANISMS PRESENT CORRELATE WITH MICROBIOLOGY    Comment: SENT FOR PATHOLOGY REVIEW RESULT CALLED TO, READ BACK BY AND VERIFIED WITH: ANN,HAWKINS AT 5465 ON 01/16/18 BY A,MOHAMED Performed at [facility], [place] [address], [place], [place] [address]   CSF culture     Status: None (Preliminary result)   Collection Time: 01/15/18 11:51 PM  Result Value Ref Range   Specimen Description CSF    Special Requests NONE    Gram Stain      WBC PRESENT, PREDOMINANTLY PMN GRAM POSITIVE COCCI CRITICAL RESULT CALLED TO, READ BACK BY AND VERIFIED WITH: ANN HAWKINS AT 0345 ON 01/16/18 BY A,MOHAMED  CYTOSPIN SMEAR Performed at [facility] [address], [place], [place] [address]    Culture PENDING    Report Status PENDING   Protein and glucose, CSF     Status: Abnormal   Collection Time: 01/15/18 11:51 PM  Result Value Ref Range   Glucose, CSF <20 (LL) 40 - 70 mg/dL    Comment: CRITICAL RESULT CALLED TO, READ BACK BY AND VERIFIED WITH: HAWKINS,E RN @12 /28/19 JACKSON,K    Total  Protein, CSF 296 (H) 15 - 45 mg/dL    Comment: RESULTS CONFIRMED BY MANUAL DILUTION RESULT CALLED TO, READ BACK BY AND VERIFIED WITH: E HAWKINS,RN 01/16/18 0543 RHOLMES Performed at [facility], [place] [address] [place], [place] [address] CORRECTED ON 12/28 AT 0543: PREVIOUSLY REPORTED AS <6 RESULTS CONFIRMED BY MANUAL DILUTION   MRSA PCR Screening     Status: None   Collection Time:  01/16/18 12:53 AM  Result Value Ref Range   MRSA by PCR NEGATIVE NEGATIVE    Comment:        The GeneXpert MRSA Assay (FDA approved for NASAL specimens only), is one component of a comprehensive MRSA colonization surveillance program. It is not intended to diagnose MRSA infection nor to guide or monitor treatment for MRSA infections. Performed at [facility], [place] [address], [place], [place] [address]   Comprehensive metabolic panel     Status: Abnormal   Collection Time: 01/16/18  3:27 AM  Result Value Ref Range   Sodium 130 (L) 135 - 145 mmol/L   Potassium 3.0 (L) 3.5 - 5.1 mmol/L   Chloride 96 (L) 98 - 111 mmol/L   CO2 20 (L) 22 - 32 mmol/L   Glucose, Bld 120 (H) 70 - 99 mg/dL   BUN 13 6 - 20 mg/dL   Creatinine, Ser 0.40 (L) 0.44 - 1.00 mg/dL   Calcium 6.9 (L) 8.9 - 10.3 mg/dL   Total Protein 5.7 (L) 6.5 - 8.1 g/dL   Albumin 2.2 (L) 3.5 - 5.0 g/dL   AST 29 15 - 41 U/L   ALT 30 0 - 44 U/L   Alkaline Phosphatase 126 38 - 126 U/L   Total Bilirubin 0.7 0.3 - 1.2 mg/dL   GFR calc non Af  Amer >60 >60 mL/min   GFR calc Af Amer >60 >60 mL/min   Anion gap 14 5 - 15    Comment: Performed at [facility], [place] [address], [place], [place] [address]  CBC     Status: Abnormal   Collection Time: 01/16/18  3:27 AM  Result Value Ref Range   WBC 31.4 (H) 4.0 - 10.5 K/uL    Comment: WHITE COUNT CONFIRMED ON SMEAR   RBC 4.54 3.87 - 5.11 MIL/uL   Hemoglobin 13.0 12.0 - 15.0 g/dL   HCT 38.8 36.0 - 46.0 %   MCV 85.5 80.0 - 100.0 fL   MCH 28.6 26.0 - 34.0 pg   MCHC 33.5 30.0 - 36.0 g/dL   RDW 13.3 11.5 - 15.5 %   Platelets 138 (L) 150 - 400 K/uL    Comment: REPEATED TO VERIFY   nRBC 0.0 0.0 - 0.2 %    Comment: Performed at [facility], [place] [address] [place], [place] [address]  Basic metabolic panel     Status: Abnormal   Collection Time: 01/16/18  6:56 AM  Result Value Ref Range   Sodium 130 (L) 135 - 145 mmol/L    Potassium 3.8 3.5 - 5.1 mmol/L    Comment: DELTA CHECK NOTED   Chloride 99 98 - 111 mmol/L   CO2 20 (L) 22 - 32 mmol/L   Glucose, Bld 114 (H) 70 - 99 mg/dL   BUN 12 6 - 20 mg/dL   Creatinine, Ser 0.38 (L) 0.44 - 1.00 mg/dL   Calcium 6.9 (L) 8.9 - 10.3 mg/dL   GFR calc non Af Amer >60 >60 mL/min   GFR calc Af Amer >60 >60 mL/min   Anion gap 11 5 - 15    Comment: Performed at [facility], [place] [address], [place], [place] [address]    MICRO: 12/27 csf cx - pending, gram stain showing gpc 12/27 blood cx - in 1 set growing gpc, identified as strep pneumonaie IMAGING: Ct Head Wo Contrast  Result Date: 01/15/2018 CLINICAL DATA:  Initial evaluation for acute headache, falls. EXAM: CT HEAD WITHOUT CONTRAST CT CERVICAL SPINE WITHOUT CONTRAST TECHNIQUE: Multidetector CT imaging of the head and cervical spine was performed following the standard protocol without intravenous contrast. Multiplanar CT image reconstructions of the cervical spine were also generated. COMPARISON:  None. FINDINGS: CT HEAD FINDINGS Brain: Cerebral volume within normal limits for patient age. No evidence for acute intracranial hemorrhage. No findings to suggest acute large vessel territory infarct. No mass lesion, midline shift, or mass effect. Ventricles are normal in size without evidence for hydrocephalus. No extra-axial fluid collection identified. Vascular: No hyperdense vessel identified. Skull: Scalp soft tissues demonstrate no acute abnormality. Calvarium intact. Sinuses/Orbits: Globes and orbital soft tissues within normal limits. Moderate mucosal thickening and opacity within the ethmoidal air cells and sphenoid sinuses. Paranasal sinuses are otherwise clear. No mastoid effusion. CT CERVICAL SPINE FINDINGS Alignment: Straightening of the normal cervical lordosis. No listhesis or malalignment. Skull base and vertebrae: Skull base intact. Normal C1-2 articulations are preserved in the dens is intact.  Vertebral body heights maintained. No acute fracture. Soft tissues and spinal canal: Soft tissues of the neck demonstrate no acute finding. No abnormal prevertebral edema. Spinal canal within normal limits. Disc levels: Mild degenerative spondylolysis present at C4-5 and C5-6. No significant spinal stenosis within the cervical spine. Thoracic fusion hardware partially visualized. Upper chest: Visualized upper chest limited evaluation due to streak artifact from  thoracic hardware. No obvious abnormality identified. Other: None. IMPRESSION: CT BRAIN: 1. No acute intracranial abnormality. 2. Moderate sphenoid ethmoidal sinus disease. CT CERVICAL SPINE: No CT evidence for acute traumatic injury within the cervical spine. Electronically Signed   By: Jeannine Boga M.D.   On: 01/15/2018 23:10   Ct Cervical Spine Wo Contrast  Result Date: 01/15/2018 CLINICAL DATA:  Initial evaluation for acute headache, falls. EXAM: CT HEAD WITHOUT CONTRAST CT CERVICAL SPINE WITHOUT CONTRAST TECHNIQUE: Multidetector CT imaging of the head and cervical spine was performed following the standard protocol without intravenous contrast. Multiplanar CT image reconstructions of the cervical spine were also generated. COMPARISON:  None. FINDINGS: CT HEAD FINDINGS Brain: Cerebral volume within normal limits for patient age. No evidence for acute intracranial hemorrhage. No findings to suggest acute large vessel territory infarct. No mass lesion, midline shift, or mass effect. Ventricles are normal in size without evidence for hydrocephalus. No extra-axial fluid collection identified. Vascular: No hyperdense vessel identified. Skull: Scalp soft tissues demonstrate no acute abnormality. Calvarium intact. Sinuses/Orbits: Globes and orbital soft tissues within normal limits. Moderate mucosal thickening and opacity within the ethmoidal air cells and sphenoid sinuses. Paranasal sinuses are otherwise clear. No mastoid effusion. CT CERVICAL  SPINE FINDINGS Alignment: Straightening of the normal cervical lordosis. No listhesis or malalignment. Skull base and vertebrae: Skull base intact. Normal C1-2 articulations are preserved in the dens is intact. Vertebral body heights maintained. No acute fracture. Soft tissues and spinal canal: Soft tissues of the neck demonstrate no acute finding. No abnormal prevertebral edema. Spinal canal within normal limits. Disc levels: Mild degenerative spondylolysis present at C4-5 and C5-6. No significant spinal stenosis within the cervical spine. Thoracic fusion hardware partially visualized. Upper chest: Visualized upper chest limited evaluation due to streak artifact from thoracic hardware. No obvious abnormality identified. Other: None. IMPRESSION: CT BRAIN: 1. No acute intracranial abnormality. 2. Moderate sphenoid ethmoidal sinus disease. CT CERVICAL SPINE: No CT evidence for acute traumatic injury within the cervical spine. Electronically Signed   By: Jeannine Boga M.D.   On: 01/15/2018 23:10   Ct Abdomen Pelvis W Contrast  Result Date: 01/15/2018 CLINICAL DATA:  Status post fall few days ago with acute generalized abdomen pain EXAM: CT ABDOMEN AND PELVIS WITH CONTRAST TECHNIQUE: Multidetector CT imaging of the abdomen and pelvis was performed using the standard protocol following bolus administration of intravenous contrast. CONTRAST:  169mL ISOVUE-300 IOPAMIDOL (ISOVUE-300) INJECTION 61%, 39mL OMNIPAQUE IOHEXOL 300 MG/ML SOLN COMPARISON:  None. FINDINGS: Lower chest: Minimal bilateral pleural effusions are noted. Hepatobiliary: Patchy low density is identified near the falciform ligament probably due to focal fatty infiltration of liver. The liver is otherwise normal. The gallbladder is normal. The biliary tree is normal. Pancreas: Unremarkable. No pancreatic ductal dilatation or surrounding inflammatory changes. Spleen: Normal in size without focal abnormality. Adrenals/Urinary Tract: The bilateral  adrenal glands are normal. The right kidney is normal. In the lower pole left kidney, there is a heterogeneous enhancing mass measuring 2.1 x 2.7 cm. There is no hydronephrosis bilaterally. The bladder is normal. Stomach/Bowel: Stomach is within normal limits. The appendix is not definitely seen but no inflammation is noted around cecum. No evidence of bowel wall thickening, distention, or inflammatory changes. Vascular/Lymphatic: No significant vascular findings are present. No enlarged abdominal or pelvic lymph nodes. Reproductive: Uterus and bilateral adnexa are unremarkable. Other: None. Musculoskeletal: Patient status post prior fixation of thoracic and upper lumbar spine. IMPRESSION: No acute abnormality identified in the abdomen pelvis. Heterogeneous enhancing mass  in the lower pole left kidney measuring 2.1 x 2.7 cm. Recommend further evaluation with renal MRI on outpatient basis as neoplasm of the kidney is not excluded. Electronically Signed   By: Abelardo Diesel M.D.   On: 01/15/2018 22:37   Dg Chest Port 1 View  Result Date: 01/15/2018 CLINICAL DATA:  Pt reports generalized weakness, dizziness with n/v and h/a Sunday night. Went to a clinic yesterday for her h/a and was given 2 injections with some relief. Her father reports slurred speech, LSN was 2 days ago. EXAM: PORTABLE CHEST 1 VIEW COMPARISON:  08/27/2016 FINDINGS: Shallow lung inflation. Heart size is normal. No focal consolidations or pleural effusions. No pulmonary edema. Thoracolumbar spinal fixation rods. IMPRESSION: No evidence for acute cardiopulmonary abnormality. Electronically Signed   By: Nolon Nations M.D.   On: 01/15/2018 18:03   US Abdomen Limited Ruq  Result Date: 01/15/2018 CLINICAL DATA:  Right upper quadrant pain x5 days. EXAM: ULTRASOUND ABDOMEN LIMITED RIGHT UPPER QUADRANT COMPARISON:  None. FINDINGS: Gallbladder: No gallstones or wall thickening visualized. No sonographic Murphy sign noted by sonographer. Common bile  duct: Diameter: 2.7 mm Liver: No focal lesion identified. Within normal limits in parenchymal echogenicity. Portal vein is patent on color Doppler imaging with normal direction of blood flow towards the liver. Other: Relative to the liver, the right adjacent kidney is slightly echogenic but maintains its cortical-medullary distinction. The possibility of mild medical renal disease is not entirely excluded. IMPRESSION: Unremarkable right upper quadrant abdominal ultrasound. Slightly echogenic appearance of the right kidney relative to the liver, a nonspecific finding but can be seen in medical renal disease. Electronically Signed   By: Ashley Royalty M.D.   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 Will test if having further BM today - place on enteric precautions  Hyponatremia/hypokalemia = could be resultant of N/V/D that she had coming into the hospital. Continue to monitor, replete electrolytes as needed .  Health maintenance = please check for hiv ab  Caren Griffins B. [facility] [phone]

## 2018-01-16 NOTE — Progress Notes (Signed)
  Echocardiogram 2D Echocardiogram has been performed.  Emma Martin 01/16/2018, 10:02 AM

## 2018-01-17 DIAGNOSIS — B953 Streptococcus pneumoniae as the cause of diseases classified elsewhere: Secondary | ICD-10-CM

## 2018-01-17 DIAGNOSIS — R6520 Severe sepsis without septic shock: Secondary | ICD-10-CM

## 2018-01-17 DIAGNOSIS — G001 Pneumococcal meningitis: Secondary | ICD-10-CM

## 2018-01-17 DIAGNOSIS — I10 Essential (primary) hypertension: Secondary | ICD-10-CM

## 2018-01-17 DIAGNOSIS — I340 Nonrheumatic mitral (valve) insufficiency: Secondary | ICD-10-CM

## 2018-01-17 DIAGNOSIS — A403 Sepsis due to Streptococcus pneumoniae: Principal | ICD-10-CM

## 2018-01-17 DIAGNOSIS — D72829 Elevated white blood cell count, unspecified: Secondary | ICD-10-CM

## 2018-01-17 DIAGNOSIS — H9190 Unspecified hearing loss, unspecified ear: Secondary | ICD-10-CM

## 2018-01-17 DIAGNOSIS — G9341 Metabolic encephalopathy: Secondary | ICD-10-CM

## 2018-01-17 LAB — CBC WITH DIFFERENTIAL/PLATELET
Abs Immature Granulocytes: 0.8 10*3/uL — ABNORMAL HIGH (ref 0.00–0.07)
Band Neutrophils: 2 %
Basophils Absolute: 0 10*3/uL (ref 0.0–0.1)
Basophils Relative: 0 %
Eosinophils Absolute: 0 10*3/uL (ref 0.0–0.5)
Eosinophils Relative: 0 %
HCT: 39.4 % (ref 36.0–46.0)
Hemoglobin: 13.1 g/dL (ref 12.0–15.0)
Lymphocytes Relative: 7 %
Lymphs Abs: 1.8 10*3/uL (ref 0.7–4.0)
MCH: 28.2 pg (ref 26.0–34.0)
MCHC: 33.2 g/dL (ref 30.0–36.0)
MCV: 84.9 fL (ref 80.0–100.0)
MYELOCYTES: 2 %
Metamyelocytes Relative: 1 %
Monocytes Absolute: 0.5 10*3/uL (ref 0.1–1.0)
Monocytes Relative: 2 %
Neutro Abs: 22.1 10*3/uL — ABNORMAL HIGH (ref 1.7–7.7)
Neutrophils Relative %: 86 %
Platelets: 164 10*3/uL (ref 150–400)
RBC: 4.64 MIL/uL (ref 3.87–5.11)
RDW: 13.4 % (ref 11.5–15.5)
WBC: 25.1 10*3/uL — ABNORMAL HIGH (ref 4.0–10.5)
nRBC: 0 % (ref 0.0–0.2)

## 2018-01-17 LAB — BASIC METABOLIC PANEL
Anion gap: 8 (ref 5–15)
BUN: 12 mg/dL (ref 6–20)
CO2: 26 mmol/L (ref 22–32)
Calcium: 7.3 mg/dL — ABNORMAL LOW (ref 8.9–10.3)
Chloride: 103 mmol/L (ref 98–111)
Creatinine, Ser: 0.35 mg/dL — ABNORMAL LOW (ref 0.44–1.00)
GFR calc non Af Amer: >60 mL/min (ref >60)
Glucose, Bld: 179 mg/dL — ABNORMAL HIGH (ref 70–99)
Potassium: 3.8 mmol/L (ref 3.5–5.1)
Sodium: 137 mmol/L (ref 135–145)

## 2018-01-17 MED ORDER — LISINOPRIL 10 MG PO TABS
10.0000 mg | ORAL_TABLET | Freq: Every day | ORAL | Status: DC
  Administered 2018-01-17 – 2018-01-20 (×3): 10 mg via ORAL
  Filled 2018-01-17 (×3): qty 1

## 2018-01-17 MED ORDER — SODIUM CHLORIDE 0.9 % IV SOLN
INTRAVENOUS | Status: DC | PRN
  Administered 2018-01-17: 250 mL via INTRAVENOUS
  Administered 2018-01-20: 500 mL via INTRAVENOUS

## 2018-01-17 MED ORDER — FLUTICASONE PROPIONATE 50 MCG/ACT NA SUSP
2.0000 | Freq: Every day | NASAL | Status: DC
  Administered 2018-01-17 – 2018-01-20 (×3): 2 via NASAL
  Filled 2018-01-17: qty 16

## 2018-01-17 MED ORDER — MECLIZINE HCL 25 MG PO TABS
25.0000 mg | ORAL_TABLET | Freq: Three times a day (TID) | ORAL | Status: DC | PRN
  Filled 2018-01-17: qty 1

## 2018-01-17 NOTE — Progress Notes (Addendum)
PROGRESS NOTE   Emma Martin  ZOX:[id number]    DOB: Apr 12, 1980    DOA: 01/15/2018  PCP: Maurice Small, MD   I have briefly reviewed patients previous medical records in [facility].  Brief Narrative:  37 year old female patient, lives with her father, independent, PMH of Turner syndrome, HTN, hypothyroid, bicuspid aortic valve, GAD, presented to [facility] ED on 01/15/2018 with 1 week history of headache and neck pain followed several days later by nausea, vomiting, dizziness, generalized weakness and malaise, mild diarrhea and RUQ pain, dyspnea, denied fever but had chills.  Hypothermic/temperature 96.6 rectally in ED, marked neutrophilic [id number]/21.3, positive for neck stiffness.  Admitted to stepdown unit for suspected pneumococcal acute bacterial meningitis with bacteremia.  Status post LP by EDP.  ID consulted 12/28.  C. difficile negative.  TTE technically difficult study but negative for vegetations.  Transferring to medical bed 12/29.   Assessment & Plan:   Principal Problem:   Sepsis ([place]) Active Problems:   HTN (hypertension)   Hypothyroidism   Generalized anxiety disorder   Hyponatremia   Hypokalemia   1. Sepsis due to pneumococcal acute bacterial meningitis with bacteremia: Met sepsis criteria on admission (hypothermia, tachycardia, leukocytosis, neck stiffness).  Urine microscopy, chest x-ray, CT abdomen and pelvis with contrast and RUQ ultrasound without acute findings.  MRSA PCR negative.  Status post LP by EDP on night of admission.  CSF results appreciated, Gram stain shows gram-positive cocci, final cultures pending.  RBC 8, WBC 40s with predominantly neutrophilic, low glucose and elevated protein.  1 of 2 blood cultures positive for gram-positive cocci and CSF Gram stain shows gram-positive cocci.  BCID confirms strep pneumonia.  CT head without acute findings.  She received a dose of cefepime and metronidazole prior to CSF collection.  Currently on  meningitic doses of IV ceftriaxone and vancomycin, continue. Added IV Decadron 10 mg every 6 hourly x4 days.  TEE technically difficult study but no vegetations.  Droplet isolation discontinued.  ID/Dr. Baxter Flattery input appreciated.  Improving.  ID may consider stopping vancomycin. 2. Hypokalemia: Replaced.  Magnesium 2.3. 3. Hyponatremia: May be due to dehydration due to poor oral intake, GI losses, sepsis and due to HCTZ.  Presented with sodium of 125 which which gradually improved over >36 hours to 137.  DC IV fluids.  Encourage oral intake. 4. Leukocytosis: Secondary to sepsis.  WBC was 41.  Gradually improving. 5. Thrombocytopenia: Likely related to sepsis.  No bleeding reported.  Resolved. 6. Essential hypertension: Held lisinopril-HCTZ.  IV PRN hydralazine.  Blood pressure starting to rise, initiate lisinopril without HCTZ. 7. Hypothyroid: Continue Synthroid. 8. Generalized anxiety disorder: Fluoxetine temporarily held due to hyponatremia, consider resuming fluoxetine soon pending consistent alertness. 9. Nausea, vomiting and diarrhea:?  Related to sepsis picture.  C. difficile tested and negative.  No BM overnight.  Tolerating diet without nausea or vomiting. 10. Left kidney mass: Noted on CT abdomen and pelvis with contrast.  Outpatient follow-up with renal MRI.   DVT prophylaxis: SCD Code Status: Full Family Communication: I was unable to reach patient's father on his phone and unable to leave a voicemail message.  I discussed in detail with patient's sister via phone, updated care and answered questions.  She indicates that patient is doing much better compared to admission. Disposition: Admitted to stepdown unit.  Patient clinically improved and stable and transferred to medical bed 01/17/2018.   Consultants:  ID  Procedures:  LP by EDP on night of admission  Antimicrobials:  IV Flagyl x1 dose IV cefepime x1 dose IV vancomycin and ceftriaxone 12/27 >   Subjective: As per  overnight RN report, mental status much better compared to night of admission, alert and oriented, tolerated diet without nausea, vomiting, abdominal pain.  No diarrhea overnight.  Patient sleepy but easily arousable.  Feels better.  Headache and neck pain improved.  Denies any other complaints.  ROS: As above, otherwise negative.  Objective:  Vitals:   01/16/18 2300 01/17/18 0008 01/17/18 0414 01/17/18 0500  BP: (!) 141/100   133/81  Pulse: 96   92  Resp: (!) 21   (!) 22  Temp:  (!) 97.5 F (36.4 C) (!) 96.9 F (36.1 C)   TempSrc:  Oral Axillary   SpO2: 98%   98%  Weight:      Height:        Examination:  General exam: Pleasant young female, small built and moderately nourished lying comfortably propped up in bed without distress.  Oral mucosa moist Respiratory system: Clear to auscultation. Respiratory effort normal.  Stable Cardiovascular system: S1 & S2 heard, RRR. No JVD, murmurs, rubs, gallops or clicks. No pedal edema.  Telemetry personally reviewed: Sinus rhythm Gastrointestinal system: Abdomen is nondistended, soft and nontender. No organomegaly or masses felt. Normal bowel sounds heard.  Stable Central nervous system: Sleepy but easily arousable and oriented. No focal neurological deficits.  Neck stiffness 1+, improved compared to yesterday Extremities: Symmetric 5 x 5 power. Skin: Minimal superficial bruising of bilateral MCP joints with mild faint patchy redness of left dorsum of hand without any other acute findings Psychiatry: Judgement and insight appear impaired. Mood & affect appropriate.     Data Reviewed: I have personally reviewed following labs and imaging studies  CBC: Recent Labs  Lab 01/15/18 1639 01/16/18 0327 01/17/18 0533  WBC 41.2* 31.4* 25.1*  NEUTROABS 34.6*  --  22.1*  HGB 15.0 13.0 13.1  HCT 44.5 38.8 39.4  MCV 85.2 85.5 84.9  PLT 147* 138* 497   Basic Metabolic Panel: Recent Labs  Lab 01/15/18 1639 01/15/18 1657 01/16/18 0327  01/16/18 0656 01/16/18 1649 01/17/18 0533  NA 125*  --  130* 130* 135 137  K 2.9*  --  3.0* 3.8 3.9 3.8  CL 85*  --  96* 99 102 103  CO2 26  --  20* 20* 23 26  GLUCOSE 177*  --  120* 114* 158* 179*  BUN 23*  --  13 12 12 12   CREATININE 0.57  --  0.40* 0.38* 0.32* 0.35*  CALCIUM 8.2*  --  6.9* 6.9* 7.3* 7.3*  MG  --  2.3  --   --   --   --    Liver Function Tests: Recent Labs  Lab 01/15/18 1639 01/16/18 0327  AST 34 29  ALT 36 30  ALKPHOS 139* 126  BILITOT 0.9 0.7  PROT 7.3 5.7*  ALBUMIN 3.0* 2.2*     Recent Results (from the past 240 hour(s))  Blood Culture (routine x 2)     Status: None (Preliminary result)   Collection Time: 01/15/18  4:56 PM  Result Value Ref Range Status   Specimen Description   Final    BLOOD LEFT ANTECUBITAL Performed at [facility] [address] [place], [place] [address]    Special Requests   Final    BOTTLES DRAWN AEROBIC AND ANAEROBIC Blood Culture adequate volume Performed at [facility] [address] [place],  [address]  Culture  Setup Time   Final    GRAM POSITIVE COCCI IN BOTH AEROBIC AND ANAEROBIC BOTTLES    Culture   Final    NO GROWTH < 24 HOURS Performed at [facility], [place] [address], [place], [place] [address]    Report Status PENDING  Incomplete  Blood Culture (routine x 2)     Status: None (Preliminary result)   Collection Time: 01/15/18  5:01 PM  Result Value Ref Range Status   Specimen Description   Final    BLOOD LEFT ANTECUBITAL Performed at [facility] [address] [place], [place] [address]    Special Requests   Final    BOTTLES DRAWN AEROBIC AND ANAEROBIC Blood Culture adequate volume Performed at [facility] [address] [place], [place] [address]    Culture  Setup Time   Final    GRAM POSITIVE COCCI IN BOTH AEROBIC AND ANAEROBIC BOTTLES CRITICAL RESULT CALLED TO, READ BACK BY AND VERIFIED WITH: RN  [place], [place] [id number] FCP Performed at [facility], [place] [address] [place], [place] [address]    Culture GRAM POSITIVE COCCI  Final   Report Status PENDING  Incomplete  Blood Culture ID Panel (Reflexed)     Status: Abnormal   Collection Time: 01/15/18  5:01 PM  Result Value Ref Range Status   Enterococcus species NOT DETECTED NOT DETECTED Final   Listeria monocytogenes NOT DETECTED NOT DETECTED Final   Staphylococcus species NOT DETECTED NOT DETECTED Final   Staphylococcus aureus (BCID) NOT DETECTED NOT DETECTED Final   Streptococcus species DETECTED (A) NOT DETECTED Final    Comment: CRITICAL RESULT CALLED TO, READ BACK BY AND VERIFIED WITH: RN HAKINGS, E 0727 [phone] FCP    Streptococcus agalactiae NOT DETECTED NOT DETECTED Final   Streptococcus pneumoniae DETECTED (A) NOT DETECTED Final    Comment: CRITICAL RESULT CALLED TO, READ BACK BY AND VERIFIED WITH: RN HAKINGS, E [place] [id number] FCP    Streptococcus pyogenes NOT DETECTED NOT DETECTED Final   Acinetobacter baumannii NOT DETECTED NOT DETECTED Final   Enterobacteriaceae species NOT DETECTED NOT DETECTED Final   Enterobacter cloacae complex NOT DETECTED NOT DETECTED Final   Escherichia coli NOT DETECTED NOT DETECTED Final   Klebsiella oxytoca NOT DETECTED NOT DETECTED Final   Klebsiella pneumoniae NOT DETECTED NOT DETECTED Final   Proteus species NOT DETECTED NOT DETECTED Final   Serratia marcescens NOT DETECTED NOT DETECTED Final   Haemophilus influenzae NOT DETECTED NOT DETECTED Final   Neisseria meningitidis NOT DETECTED NOT DETECTED Final   Pseudomonas aeruginosa NOT DETECTED NOT DETECTED Final   Candida albicans NOT DETECTED NOT DETECTED Final   Candida glabrata NOT DETECTED NOT DETECTED Final   Candida krusei NOT DETECTED NOT DETECTED Final   Candida parapsilosis NOT DETECTED NOT DETECTED Final   Candida tropicalis NOT DETECTED NOT DETECTED Final    Comment: Performed at [facility], [address],  [place], [place] [address]  CSF culture     Status: None (Preliminary result)   Collection Time: 01/15/18 11:51 PM  Result Value Ref Range Status   Specimen Description CSF  Final   Special Requests NONE  Final   Gram Stain   Final    WBC PRESENT, PREDOMINANTLY PMN GRAM POSITIVE COCCI CRITICAL RESULT CALLED TO, READ BACK BY AND VERIFIED WITH: ANN HAWKINS AT 0345 ON 01/16/18 BY A,MOHAMED  CYTOSPIN SMEAR Performed at [facility], [place] [address]  [place], [place] [address]    Culture PENDING  Incomplete   Report Status PENDING  Incomplete  MRSA PCR Screening     Status: None   Collection Time: 01/16/18 12:53 AM  Result Value Ref Range Status   MRSA by PCR NEGATIVE NEGATIVE Final    Comment:        The GeneXpert MRSA Assay (FDA approved for NASAL specimens only), is one component of a comprehensive MRSA colonization surveillance program. It is not intended to diagnose MRSA infection nor to guide or monitor treatment for MRSA infections. Performed at [facility], [place] [address], [place], [place] [address]   C difficile quick scan w PCR reflex     Status: None   Collection Time: 01/16/18  4:10 PM  Result Value Ref Range Status   C Diff antigen NEGATIVE NEGATIVE Final   C Diff toxin NEGATIVE NEGATIVE Final   C Diff interpretation No C. difficile detected.  Final    Comment: Performed at [facility], [place] [address], [place], [place] [address]         Radiology Studies: Ct Head Wo Contrast  Result Date: 01/15/2018 CLINICAL DATA:  Initial evaluation for acute headache, falls. EXAM: CT HEAD WITHOUT CONTRAST CT CERVICAL SPINE WITHOUT CONTRAST TECHNIQUE: Multidetector CT imaging of the head and cervical spine was performed following the standard protocol without intravenous contrast. Multiplanar CT image reconstructions of the cervical spine were also generated. COMPARISON:  None. FINDINGS: CT HEAD FINDINGS Brain: Cerebral volume  within normal limits for patient age. No evidence for acute intracranial hemorrhage. No findings to suggest acute large vessel territory infarct. No mass lesion, midline shift, or mass effect. Ventricles are normal in size without evidence for hydrocephalus. No extra-axial fluid collection identified. Vascular: No hyperdense vessel identified. Skull: Scalp soft tissues demonstrate no acute abnormality. Calvarium intact. Sinuses/Orbits: Globes and orbital soft tissues within normal limits. Moderate mucosal thickening and opacity within the ethmoidal air cells and sphenoid sinuses. Paranasal sinuses are otherwise clear. No mastoid effusion. CT CERVICAL SPINE FINDINGS Alignment: Straightening of the normal cervical lordosis. No listhesis or malalignment. Skull base and vertebrae: Skull base intact. Normal C1-2 articulations are preserved in the dens is intact. Vertebral body heights maintained. No acute fracture. Soft tissues and spinal canal: Soft tissues of the neck demonstrate no acute finding. No abnormal prevertebral edema. Spinal canal within normal limits. Disc levels: Mild degenerative spondylolysis present at C4-5 and C5-6. No significant spinal stenosis within the cervical spine. Thoracic fusion hardware partially visualized. Upper chest: Visualized upper chest limited evaluation due to streak artifact from thoracic hardware. No obvious abnormality identified. Other: None. IMPRESSION: CT BRAIN: 1. No acute intracranial abnormality. 2. Moderate sphenoid ethmoidal sinus disease. CT CERVICAL SPINE: No CT evidence for acute traumatic injury within the cervical spine. Electronically Signed   By: Jeannine Boga M.D.   On: 01/15/2018 23:10   Ct Cervical Spine Wo Contrast  Result Date: 01/15/2018 CLINICAL DATA:  Initial evaluation for acute headache, falls. EXAM: CT HEAD WITHOUT CONTRAST CT CERVICAL SPINE WITHOUT CONTRAST TECHNIQUE: Multidetector CT imaging of the head and cervical spine was performed  following the standard protocol without intravenous contrast. Multiplanar CT image reconstructions of the cervical spine were also generated. COMPARISON:  None. FINDINGS: CT HEAD FINDINGS Brain: Cerebral volume within normal limits for patient age. No evidence for acute intracranial hemorrhage. No findings to suggest acute large vessel territory infarct. No mass lesion, midline shift, or mass effect. Ventricles are normal in size  without evidence for hydrocephalus. No extra-axial fluid collection identified. Vascular: No hyperdense vessel identified. Skull: Scalp soft tissues demonstrate no acute abnormality. Calvarium intact. Sinuses/Orbits: Globes and orbital soft tissues within normal limits. Moderate mucosal thickening and opacity within the ethmoidal air cells and sphenoid sinuses. Paranasal sinuses are otherwise clear. No mastoid effusion. CT CERVICAL SPINE FINDINGS Alignment: Straightening of the normal cervical lordosis. No listhesis or malalignment. Skull base and vertebrae: Skull base intact. Normal C1-2 articulations are preserved in the dens is intact. Vertebral body heights maintained. No acute fracture. Soft tissues and spinal canal: Soft tissues of the neck demonstrate no acute finding. No abnormal prevertebral edema. Spinal canal within normal limits. Disc levels: Mild degenerative spondylolysis present at C4-5 and C5-6. No significant spinal stenosis within the cervical spine. Thoracic fusion hardware partially visualized. Upper chest: Visualized upper chest limited evaluation due to streak artifact from thoracic hardware. No obvious abnormality identified. Other: None. IMPRESSION: CT BRAIN: 1. No acute intracranial abnormality. 2. Moderate sphenoid ethmoidal sinus disease. CT CERVICAL SPINE: No CT evidence for acute traumatic injury within the cervical spine. Electronically Signed   By: Jeannine Boga M.D.   On: 01/15/2018 23:10   Ct Abdomen Pelvis W Contrast  Result Date:  01/15/2018 CLINICAL DATA:  Status post fall few days ago with acute generalized abdomen pain EXAM: CT ABDOMEN AND PELVIS WITH CONTRAST TECHNIQUE: Multidetector CT imaging of the abdomen and pelvis was performed using the standard protocol following bolus administration of intravenous contrast. CONTRAST:  161m ISOVUE-300 IOPAMIDOL (ISOVUE-300) INJECTION 61%, 330mOMNIPAQUE IOHEXOL 300 MG/ML SOLN COMPARISON:  None. FINDINGS: Lower chest: Minimal bilateral pleural effusions are noted. Hepatobiliary: Patchy low density is identified near the falciform ligament probably due to focal fatty infiltration of liver. The liver is otherwise normal. The gallbladder is normal. The biliary tree is normal. Pancreas: Unremarkable. No pancreatic ductal dilatation or surrounding inflammatory changes. Spleen: Normal in size without focal abnormality. Adrenals/Urinary Tract: The bilateral adrenal glands are normal. The right kidney is normal. In the lower pole left kidney, there is a heterogeneous enhancing mass measuring 2.1 x 2.7 cm. There is no hydronephrosis bilaterally. The bladder is normal. Stomach/Bowel: Stomach is within normal limits. The appendix is not definitely seen but no inflammation is noted around cecum. No evidence of bowel wall thickening, distention, or inflammatory changes. Vascular/Lymphatic: No significant vascular findings are present. No enlarged abdominal or pelvic lymph nodes. Reproductive: Uterus and bilateral adnexa are unremarkable. Other: None. Musculoskeletal: Patient status post prior fixation of thoracic and upper lumbar spine. IMPRESSION: No acute abnormality identified in the abdomen pelvis. Heterogeneous enhancing mass in the lower pole left kidney measuring 2.1 x 2.7 cm. Recommend further evaluation with renal MRI on outpatient basis as neoplasm of the kidney is not excluded. Electronically Signed   By: WeAbelardo Diesel.D.   On: 01/15/2018 22:37   Dg Chest Port 1 View  Result Date:  01/15/2018 CLINICAL DATA:  Pt reports generalized weakness, dizziness with n/v and h/a Sunday night. Went to a clinic yesterday for her h/a and was given 2 injections with some relief. Her father reports slurred speech, LSN was 2 days ago. EXAM: PORTABLE CHEST 1 VIEW COMPARISON:  08/27/2016 FINDINGS: Shallow lung inflation. Heart size is normal. No focal consolidations or pleural effusions. No pulmonary edema. Thoracolumbar spinal fixation rods. IMPRESSION: No evidence for acute cardiopulmonary abnormality. Electronically Signed   By: ElNolon Nations.D.   On: 01/15/2018 18:03   UsKoreabdomen Limited Ruq  Result Date: 01/15/2018 CLINICAL  DATA:  Right upper quadrant pain x5 days. EXAM: ULTRASOUND ABDOMEN LIMITED RIGHT UPPER QUADRANT COMPARISON:  None. FINDINGS: Gallbladder: No gallstones or wall thickening visualized. No sonographic Murphy sign noted by sonographer. Common bile duct: Diameter: 2.7 mm Liver: No focal lesion identified. Within normal limits in parenchymal echogenicity. Portal vein is patent on color Doppler imaging with normal direction of blood flow towards the liver. Other: Relative to the liver, the right adjacent kidney is slightly echogenic but maintains its cortical-medullary distinction. The possibility of mild medical renal disease is not entirely excluded. IMPRESSION: Unremarkable right upper quadrant abdominal ultrasound. Slightly echogenic appearance of the right kidney relative to the liver, a nonspecific finding but can be seen in medical renal disease. Electronically Signed   By: Ashley Royalty M.D.   On: 01/15/2018 18:53        Scheduled Meds: . chlorhexidine  15 mL Mouth Rinse BID  . dexamethasone  10 mg Intravenous Q6H  . famotidine  20 mg Oral BID  . levothyroxine  75 mcg Oral QAC breakfast   Continuous Infusions: . 0.9 % NaCl with KCl 40 mEq / L 50 mL/hr at 01/16/18 2300  . cefTRIAXone (ROCEPHIN)  IV Stopped (01/16/18 2249)  . vancomycin Stopped (01/17/18 0413)      LOS: 2 days     Vernell Leep, MD, FACP, [facility] Pager [phone]  If 7PM-7AM, please contact night-coverage [email or web address] Password [email or web address] 01/17/2018, 8:09 AM

## 2018-01-17 NOTE — Progress Notes (Signed)
[facility]    Date of Admission:  01/15/2018   Total days of antibiotics 3 vanco/CTX           ID: FRANCIA VERRY is a 37 y.o. 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 Has baseline hearing deficit and feels that it is near her baseline  ROS: headache +, no fever, chills, nightsweats, no diarrhea, n/v TTE yesterday poor acoustic windows, does have MR  Medications:  . chlorhexidine  15 mL Mouth Rinse BID  . dexamethasone  10 mg Intravenous Q6H  . famotidine  20 mg Oral BID  . fluticasone  2 spray Each Nare Daily  . levothyroxine  75 mcg Oral QAC breakfast  . lisinopril  10 mg Oral Daily    Objective: Vital signs in last 24 hours: Temp:  [96.9 F (36.1 C)-98 F (36.7 C)] 98 F (36.7 C) (12/29 1500) Pulse Rate:  [87-112] 92 (12/29 1500) Resp:  [16-29] 16 (12/29 1500) BP: (124-160)/(81-104) 131/82 (12/29 1500) SpO2:  [94 %-100 %] 100 % (12/29 1500) Physical Exam  Constitutional:  oriented to person, place, and time. appears well-developed and well-nourished. No distress.  HENT: [place]/AT, PERRLA, no scleral icterus Mouth/Throat: Oropharynx is clear and moist. No oropharyngeal exudate.  Cardiovascular: Normal rate, regular rhythm and normal heart sounds. Exam reveals no gallop and no friction rub.  No murmur heard.  Pulmonary/Chest: Effort normal and breath sounds normal. No respiratory distress.  has no wheezes.  Neck = + nuchal rigidity Abdominal: Soft. Bowel sounds are normal.  exhibits no distension. There is no tenderness.  Lymphadenopathy: no cervical adenopathy. No axillary adenopathy Neurological: alert and  oriented to person, place, and time.  Skin: Skin is warm and dry. Abrasions to dorsum of hands bilaterally Psychiatric: a normal mood and affect.  behavior is normal.    Lab Results Recent Labs    01/16/18 0327  01/16/18 1649 01/17/18 0533  WBC 31.4*  --   --  25.1*  HGB 13.0  --   --  13.1  HCT 38.8  --   --  39.4  NA 130*   < > 135 137  K 3.0*   < > 3.9 3.8  CL 96*   < > 102 103  CO2 20*   < > 23 26  BUN 13   < > 12 12  CREATININE 0.40*   < > 0.32* 0.35*   < > = values in this interval not displayed.   Liver Panel Recent Labs    01/15/18 1639 01/16/18 0327  PROT 7.3 5.7*  ALBUMIN 3.0* 2.2*  AST 34 29  ALT 36 30  ALKPHOS 139* 126  BILITOT 0.9 0.7    Microbiology: 12/27 csf strep pneumo 12/27 blood cx strep pneumo (sensi pending) Studies/Results: Ct Head Wo Contrast  Result Date: 01/15/2018 CLINICAL DATA:  Initial evaluation for acute headache, falls. EXAM: CT HEAD WITHOUT CONTRAST CT CERVICAL SPINE WITHOUT CONTRAST TECHNIQUE: Multidetector CT imaging of the head and cervical spine was performed following the standard protocol without intravenous contrast. Multiplanar CT image reconstructions of the cervical spine were also generated. COMPARISON:  None. FINDINGS: CT HEAD FINDINGS Brain: Cerebral volume within  normal limits for patient age. No evidence for acute intracranial hemorrhage. No findings to suggest acute large vessel territory infarct. No mass lesion, midline shift, or mass effect. Ventricles are normal in size without evidence for hydrocephalus. No extra-axial fluid collection identified. Vascular: No hyperdense vessel identified. Skull: Scalp soft tissues demonstrate no acute abnormality. Calvarium intact. Sinuses/Orbits: Globes and orbital soft tissues within normal limits. Moderate mucosal thickening and opacity within the ethmoidal air cells and sphenoid sinuses. Paranasal sinuses are otherwise clear. No mastoid effusion. CT CERVICAL SPINE FINDINGS Alignment:  Straightening of the normal cervical lordosis. No listhesis or malalignment. Skull base and vertebrae: Skull base intact. Normal C1-2 articulations are preserved in the dens is intact. Vertebral body heights maintained. No acute fracture. Soft tissues and spinal canal: Soft tissues of the neck demonstrate no acute finding. No abnormal prevertebral edema. Spinal canal within normal limits. Disc levels: Mild degenerative spondylolysis present at C4-5 and C5-6. No significant spinal stenosis within the cervical spine. Thoracic fusion hardware partially visualized. Upper chest: Visualized upper chest limited evaluation due to streak artifact from thoracic hardware. No obvious abnormality identified. Other: None. IMPRESSION: CT BRAIN: 1. No acute intracranial abnormality. 2. Moderate sphenoid ethmoidal sinus disease. CT CERVICAL SPINE: No CT evidence for acute traumatic injury within the cervical spine. Electronically Signed   By: Jeannine Boga M.D.   On: 01/15/2018 23:10   Ct Cervical Spine Wo Contrast  Result Date: 01/15/2018 CLINICAL DATA:  Initial evaluation for acute headache, falls. EXAM: CT HEAD WITHOUT CONTRAST CT CERVICAL SPINE WITHOUT CONTRAST TECHNIQUE: Multidetector CT imaging of the head and cervical spine was performed following the standard protocol without intravenous contrast. Multiplanar CT image reconstructions of the cervical spine were also generated. COMPARISON:  None. FINDINGS: CT HEAD FINDINGS Brain: Cerebral volume within normal limits for patient age. No evidence for acute intracranial hemorrhage. No findings to suggest acute large vessel territory infarct. No mass lesion, midline shift, or mass effect. Ventricles are normal in size without evidence for hydrocephalus. No extra-axial fluid collection identified. Vascular: No hyperdense vessel identified. Skull: Scalp soft tissues demonstrate no acute abnormality. Calvarium intact. Sinuses/Orbits: Globes and orbital soft tissues within  normal limits. Moderate mucosal thickening and opacity within the ethmoidal air cells and sphenoid sinuses. Paranasal sinuses are otherwise clear. No mastoid effusion. CT CERVICAL SPINE FINDINGS Alignment: Straightening of the normal cervical lordosis. No listhesis or malalignment. Skull base and vertebrae: Skull base intact. Normal C1-2 articulations are preserved in the dens is intact. Vertebral body heights maintained. No acute fracture. Soft tissues and spinal canal: Soft tissues of the neck demonstrate no acute finding. No abnormal prevertebral edema. Spinal canal within normal limits. Disc levels: Mild degenerative spondylolysis present at C4-5 and C5-6. No significant spinal stenosis within the cervical spine. Thoracic fusion hardware partially visualized. Upper chest: Visualized upper chest limited evaluation due to streak artifact from thoracic hardware. No obvious abnormality identified. Other: None. IMPRESSION: CT BRAIN: 1. No acute intracranial abnormality. 2. Moderate sphenoid ethmoidal sinus disease. CT CERVICAL SPINE: No CT evidence for acute traumatic injury within the cervical spine. Electronically Signed   By: Jeannine Boga M.D.   On: 01/15/2018 23:10   Ct Abdomen Pelvis W Contrast  Result Date: 01/15/2018 CLINICAL DATA:  Status post fall few days ago with acute generalized abdomen pain EXAM: CT ABDOMEN AND PELVIS WITH CONTRAST TECHNIQUE: Multidetector CT imaging of the abdomen and pelvis was performed using the standard protocol following bolus administration of intravenous contrast. CONTRAST:  114mL ISOVUE-300 IOPAMIDOL (ISOVUE-300)  INJECTION 61%, 34mL OMNIPAQUE IOHEXOL 300 MG/ML SOLN COMPARISON:  None. FINDINGS: Lower chest: Minimal bilateral pleural effusions are noted. Hepatobiliary: Patchy low density is identified near the falciform ligament probably due to focal fatty infiltration of liver. The liver is otherwise normal. The gallbladder is normal. The biliary tree is normal.  Pancreas: Unremarkable. No pancreatic ductal dilatation or surrounding inflammatory changes. Spleen: Normal in size without focal abnormality. Adrenals/Urinary Tract: The bilateral adrenal glands are normal. The right kidney is normal. In the lower pole left kidney, there is a heterogeneous enhancing mass measuring 2.1 x 2.7 cm. There is no hydronephrosis bilaterally. The bladder is normal. Stomach/Bowel: Stomach is within normal limits. The appendix is not definitely seen but no inflammation is noted around cecum. No evidence of bowel wall thickening, distention, or inflammatory changes. Vascular/Lymphatic: No significant vascular findings are present. No enlarged abdominal or pelvic lymph nodes. Reproductive: Uterus and bilateral adnexa are unremarkable. Other: None. Musculoskeletal: Patient status post prior fixation of thoracic and upper lumbar spine. IMPRESSION: No acute abnormality identified in the abdomen pelvis. Heterogeneous enhancing mass in the lower pole left kidney measuring 2.1 x 2.7 cm. Recommend further evaluation with renal MRI on outpatient basis as neoplasm of the kidney is not excluded. Electronically Signed   By: Abelardo Diesel M.D.   On: 01/15/2018 22:37   Dg Chest Port 1 View  Result Date: 01/15/2018 CLINICAL DATA:  Pt reports generalized weakness, dizziness with n/v and h/a Sunday night. Went to a clinic yesterday for her h/a and was given 2 injections with some relief. Her father reports slurred speech, LSN was 2 days ago. EXAM: PORTABLE CHEST 1 VIEW COMPARISON:  08/27/2016 FINDINGS: Shallow lung inflation. Heart size is normal. No focal consolidations or pleural effusions. No pulmonary edema. Thoracolumbar spinal fixation rods. IMPRESSION: No evidence for acute cardiopulmonary abnormality. Electronically Signed   By: Nolon Nations M.D.   On: 01/15/2018 18:03   US Abdomen Limited Ruq  Result Date: 01/15/2018 CLINICAL DATA:  Right upper quadrant pain x5 days. EXAM: ULTRASOUND  ABDOMEN LIMITED RIGHT UPPER QUADRANT COMPARISON:  None. FINDINGS: Gallbladder: No gallstones or wall thickening visualized. No sonographic Murphy sign noted by sonographer. Common bile duct: Diameter: 2.7 mm Liver: No focal lesion identified. Within normal limits in parenchymal echogenicity. Portal vein is patent on color Doppler imaging with normal direction of blood flow towards the liver. Other: Relative to the liver, the right adjacent kidney is slightly echogenic but maintains its cortical-medullary distinction. The possibility of mild medical renal disease is not entirely excluded. IMPRESSION: Unremarkable right upper quadrant abdominal ultrasound. Slightly echogenic appearance of the right kidney relative to the liver, a nonspecific finding but can be seen in medical renal disease. Electronically Signed   By: Ashley Royalty M.D.   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 Given her bicuspid AV and that she was late for presentation, would recommend to get TEE  to rule out vegetation  Dr Johnnye Sima to provide further [facility] Cell: [phone] Pager: [phone]  01/17/2018, 5:19 PM

## 2018-01-18 DIAGNOSIS — G001 Pneumococcal meningitis: Secondary | ICD-10-CM

## 2018-01-18 DIAGNOSIS — R291 Meningismus: Secondary | ICD-10-CM

## 2018-01-18 DIAGNOSIS — R7881 Bacteremia: Secondary | ICD-10-CM

## 2018-01-18 DIAGNOSIS — Q969 Turner's syndrome, unspecified: Secondary | ICD-10-CM

## 2018-01-18 DIAGNOSIS — B953 Streptococcus pneumoniae as the cause of diseases classified elsewhere: Secondary | ICD-10-CM

## 2018-01-18 LAB — CSF CULTURE W GRAM STAIN

## 2018-01-18 LAB — CULTURE, BLOOD (ROUTINE X 2)
SPECIAL REQUESTS: ADEQUATE
Special Requests: ADEQUATE

## 2018-01-18 LAB — PATHOLOGIST SMEAR REVIEW

## 2018-01-18 NOTE — Progress Notes (Signed)
[name] will follow pt with ID team to support [facility] services for IV ABX at home upon DC.  If patient discharges after hours, please call [phone].   Emma Martin 01/18/2018, 11:04 AM

## 2018-01-18 NOTE — Progress Notes (Signed)
PROGRESS NOTE   Emma Martin  XJD:[id number]    DOB: [birth year]    DOA: 01/15/2018  PCP: Maurice Small, MD   I have briefly reviewed patients previous medical records in [facility].  Brief Narrative:  37 year old female patient, lives with her father, independent, PMH of Turner syndrome, HTN, hypothyroid, bicuspid aortic valve, GAD, presented to [facility] ED on 01/15/2018 with 1 week history of headache and neck pain followed several days later by nausea, vomiting, dizziness, generalized weakness and malaise, mild diarrhea and RUQ pain, dyspnea, denied fever but had chills.  Hypothermic/temperature 96.6 rectally in ED, marked neutrophilic [id number]/05.1, positive for neck stiffness.  Admitted to stepdown unit for pneumococcal acute bacterial meningitis with bacteremia.  Infectious disease consulting.  C. difficile negative.  TTE technically difficult study but negative for vegetations.  Transferred to medical floor.  Cardiology consulted for TEE.   Assessment & Plan:   Principal Problem:   Sepsis ([place]) Active Problems:   HTN (hypertension)   Hypothyroidism   Generalized anxiety disorder   Hyponatremia   Hypokalemia   Streptococcal bacteremia   Pneumococcal meningitis   Nuchal rigidity   1. Sepsis due to invasive pneumococcal disease with meningitis with bacteremia: Met sepsis criteria on admission (hypothermia, tachycardia, leukocytosis, neck stiffness).  Urine microscopy, chest x-ray, CT abdomen and pelvis with contrast and RUQ ultrasound without acute findings.  MRSA PCR negative. Underwent LP in the ED. CT head without acute findings.  She received a dose of cefepime and metronidazole prior to CSF collection.  Currently on meningitic doses of IV ceftriaxone and vancomycin, continue.  Completing IV Decadron 10 mg every 6 hourly x4 days.  TEE technically difficult study but no vegetations.  Droplet isolation discontinued.  CSF and blood culture confirms strep  pneumonia but intermediate sensitivity to ceftriaxone for meningitis.  ID following.  I discussed with Dr. Johnnye Sima, may need to be on vancomycin.  Surveillance blood cultures x2: Negative to date.  Cardiology consulted for TEE.  Clinically much improved. 2. Hypokalemia: Replaced.  Magnesium 2.3. 3. Hyponatremia: May be due to dehydration due to poor oral intake, GI losses, sepsis and due to HCTZ.  Presented with sodium of 125 which which gradually improved over >36 hours to 137.  DC IV fluids.  Encourage oral intake. 4. Leukocytosis: Secondary to sepsis.  WBC was 41.  Gradually improving.  No labs ordered for today.  Follow CBC in a.m. 5. Thrombocytopenia: Likely related to sepsis.  No bleeding reported.  Resolved. 6. Essential hypertension: Held lisinopril-HCTZ.  IV PRN hydralazine.  Initiated lisinopril 10 mg alone.  Blood pressures better.  Monitor. 7. Hypothyroid: Continue Synthroid. 8. Generalized anxiety disorder: Fluoxetine temporarily held due to hyponatremia, consider resuming fluoxetine soon. 9. Nausea, vomiting and diarrhea:?  Related to sepsis picture.  C. difficile tested and negative.  Resolved. 10. Left kidney mass: Noted on CT abdomen and pelvis with contrast.  Outpatient follow-up with renal MRI.   DVT prophylaxis: SCD Code Status: Full Family Communication: None at bedside. Disposition: Admitted to stepdown unit.  Patient clinically improved and stable and transferred to medical bed 01/17/2018.  DC home possibly in 48 hours pending TEE and PICC line.   Consultants:  ID  Procedures:  LP by EDP on night of admission  Antimicrobials:  IV Flagyl x1 dose IV cefepime x1 dose IV vancomycin and ceftriaxone 12/27 >   Subjective: Interviewed and examined along with female Therapist, sports.  States that she is doing fine.  Mild headache and neck  pain but significantly better than on admission.  Reports right sided hearing difficulty has significantly improved over the last couple days.  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  Tolerating diet.  ROS: As above, otherwise negative.  Objective:  Vitals:   01/17/18 2218 01/18/18 0551 01/18/18 0956 01/18/18 1410  BP: (!) 134/94 (!) 133/99 (!) 136/92 129/85  Pulse: 81 87 85 79  Resp: _0 Temp: 98 F (36.7 C) (!) 97.5 F (36.4 C)    TempSrc: Oral Oral    SpO2: 98% 100%  100%  Weight:      Height:        Examination:  General exam: Pleasant young female, small built and moderately nourished sitting up comfortably in bed eating breakfast this morning. Respiratory system: Clear to auscultation. Respiratory effort normal.  Stable Cardiovascular system: S1 & S2 heard, RRR. No JVD, murmurs, rubs, gallops or clicks. No pedal edema.  Stable Gastrointestinal system: Abdomen is nondistended, soft and nontender. No organomegaly or masses felt. Normal bowel sounds heard.  Stable Central nervous system: Alert and oriented. No focal neurological deficits.  Neck stiffness almost resolved. Extremities: Symmetric 5 x 5 power. Skin: Minimal superficial bruising of bilateral MCP joints with mild faint patchy redness of left dorsum of hand without any other acute findings Psychiatry: Judgement and insight appear impaired. Mood & affect appropriate.     Data Reviewed: I have personally reviewed following labs and imaging studies  CBC: Recent Labs  Lab 01/15/18 1639 01/16/18 0327 01/17/18 0533  WBC 41.2* 31.4* 25.1*  NEUTROABS 34.6*  --  22.1*  HGB 15.0 13.0 13.1  HCT 44.5 38.8 39.4  MCV 85.2 85.5 84.9  PLT 147* 138* 505   Basic Metabolic Panel: Recent Labs  Lab 01/15/18 1639 01/15/18 1657 01/16/18 0327 01/16/18 0656 01/16/18 1649 01/17/18 0533  NA 125*  --  130* 130* 135 137  K 2.9*  --  3.0* 3.8 3.9 3.8  CL 85*  --  96* 99 102 103  CO2 26  --  20* 20* 23 26  GLUCOSE 177*  --  120* 114* 158* 179*  BUN 23*  --  _1 CREATININE 0.57  --  0.40* 0.38* 0.32* 0.35*  CALCIUM 8.2*  --  6.9* 6.9* 7.3* 7.3*  MG   --  2.3  --   --   --   --    Liver Function Tests: Recent Labs  Lab 01/15/18 1639 01/16/18 0327  AST 34 29  ALT 36 30  ALKPHOS 139* 126  BILITOT 0.9 0.7  PROT 7.3 5.7*  ALBUMIN 3.0* 2.2*     Recent Results (from the past 240 hour(s))  Blood Culture (routine x 2)     Status: Abnormal   Collection Time: 01/15/18  4:56 PM  Result Value Ref Range Status   Specimen Description   Final    BLOOD LEFT ANTECUBITAL Performed at [facility] [address] [place], [place] [address]    Special Requests   Final    BOTTLES DRAWN AEROBIC AND ANAEROBIC Blood Culture adequate volume Performed at [facility] [address] [place], [place] [address]    Culture  Setup Time   Final    GRAM POSITIVE COCCI IN BOTH AEROBIC AND ANAEROBIC BOTTLES CRITICAL VALUE NOTED.  VALUE IS CONSISTENT WITH PREVIOUSLY REPORTED AND CALLED VALUE.    Culture (A)  Final    STREPTOCOCCUS PNEUMONIAE SUSCEPTIBILITIES PERFORMED ON PREVIOUS  CULTURE WITHIN THE LAST 5 DAYS. Performed at [facility], [place] [address], [place], [place] [address]    Report Status 01/18/2018 FINAL  Final  Blood Culture (routine x 2)     Status: Abnormal   Collection Time: 01/15/18  5:01 PM  Result Value Ref Range Status   Specimen Description   Final    BLOOD LEFT ANTECUBITAL Performed at [facility] [address], [place], [place] [address]    Special Requests   Final    BOTTLES DRAWN AEROBIC AND ANAEROBIC Blood Culture adequate volume Performed at [facility] [address] [place], [place] [address]    Culture  Setup Time   Final    GRAM POSITIVE COCCI IN BOTH AEROBIC AND ANAEROBIC BOTTLES CRITICAL RESULT CALLED TO, READ BACK BY AND VERIFIED WITH: RN [place], [place] [id number] FCP Performed at [facility], [place] [address], [place], [place] [address]    Culture STREPTOCOCCUS PNEUMONIAE (A)  Final   Report Status 01/18/2018 FINAL   Final   Organism ID, Bacteria STREPTOCOCCUS PNEUMONIAE  Final      Susceptibility   Streptococcus pneumoniae - MIC*    ERYTHROMYCIN >=8 RESISTANT Resistant     LEVOFLOXACIN 0.5 SENSITIVE Sensitive     VANCOMYCIN 0.5 SENSITIVE Sensitive     PENICILLIN (meningitis) 1 RESISTANT Resistant     PENICILLIN (non-meningitis) 1 SENSITIVE Sensitive     CEFTRIAXONE (non-meningitis) 1 SENSITIVE Sensitive     CEFTRIAXONE (meningitis) 1 INTERMEDIATE Intermediate     * STREPTOCOCCUS PNEUMONIAE  Blood Culture ID Panel (Reflexed)     Status: Abnormal   Collection Time: 01/15/18  5:01 PM  Result Value Ref Range Status   Enterococcus species NOT DETECTED NOT DETECTED Final   Listeria monocytogenes NOT DETECTED NOT DETECTED Final   Staphylococcus species NOT DETECTED NOT DETECTED Final   Staphylococcus aureus (BCID) NOT DETECTED NOT DETECTED Final   Streptococcus species DETECTED (A) NOT DETECTED Final    Comment: CRITICAL RESULT CALLED TO, READ BACK BY AND VERIFIED WITH: RN HAKINGS, E 0727 [phone] FCP    Streptococcus agalactiae NOT DETECTED NOT DETECTED Final   Streptococcus pneumoniae DETECTED (A) NOT DETECTED Final    Comment: CRITICAL RESULT CALLED TO, READ BACK BY AND VERIFIED WITH: RN HAKINGS, E 0727 [id number] FCP    Streptococcus pyogenes NOT DETECTED NOT DETECTED Final   Acinetobacter baumannii NOT DETECTED NOT DETECTED Final   Enterobacteriaceae species NOT DETECTED NOT DETECTED Final   Enterobacter cloacae complex NOT DETECTED NOT DETECTED Final   Escherichia coli NOT DETECTED NOT DETECTED Final   Klebsiella oxytoca NOT DETECTED NOT DETECTED Final   Klebsiella pneumoniae NOT DETECTED NOT DETECTED Final   Proteus species NOT DETECTED NOT DETECTED Final   Serratia marcescens NOT DETECTED NOT DETECTED Final   Haemophilus influenzae NOT DETECTED NOT DETECTED Final   Neisseria meningitidis NOT DETECTED NOT DETECTED Final   Pseudomonas aeruginosa NOT DETECTED NOT DETECTED Final   Candida albicans  NOT DETECTED NOT DETECTED Final   Candida glabrata NOT DETECTED NOT DETECTED Final   Candida krusei NOT DETECTED NOT DETECTED Final   Candida parapsilosis NOT DETECTED NOT DETECTED Final   Candida tropicalis NOT DETECTED NOT DETECTED Final    Comment: Performed at [facility], [address], [place], [place] [address]  CSF culture     Status: Abnormal   Collection Time: 01/15/18 11:51 PM  Result Value Ref Range Status   Specimen Description  Final    CSF Performed at [facility] [address], [place], [place] [address]    Special Requests   Final    NONE Performed at [facility], [place] [address], [place], [place] [address]    Gram Stain   Final    WBC PRESENT, PREDOMINANTLY PMN GRAM POSITIVE COCCI CRITICAL RESULT CALLED TO, READ BACK BY AND VERIFIED WITH: ANN HAWKINS AT 0345 ON 01/16/18 BY A,MOHAMED  CYTOSPIN SMEAR Performed at [facility] [address] [place], [place] [address]    Culture STREPTOCOCCUS PNEUMONIAE (A)  Final   Report Status 01/18/2018 FINAL  Final   Organism ID, Bacteria STREPTOCOCCUS PNEUMONIAE  Final      Susceptibility   Streptococcus pneumoniae - MIC*    ERYTHROMYCIN >=8 RESISTANT Resistant     LEVOFLOXACIN 0.5 SENSITIVE Sensitive     VANCOMYCIN 0.5 SENSITIVE Sensitive     PENICILLIN (meningitis) 1 RESISTANT Resistant     PENICILLIN (non-meningitis) 1 SENSITIVE Sensitive     CEFTRIAXONE (non-meningitis) 1 SENSITIVE Sensitive     CEFTRIAXONE (meningitis) 1 INTERMEDIATE Intermediate     * STREPTOCOCCUS PNEUMONIAE  MRSA PCR Screening     Status: None   Collection Time: 01/16/18 12:53 AM  Result Value Ref Range Status   MRSA by PCR NEGATIVE NEGATIVE Final    Comment:        The GeneXpert MRSA Assay (FDA approved for NASAL specimens only), is one component of a comprehensive MRSA colonization surveillance program. It is not intended to diagnose MRSA infection nor to guide  or monitor treatment for MRSA infections. Performed at [facility], [place] [address] [place], [place] [address]   C difficile quick scan w PCR reflex     Status: None   Collection Time: 01/16/18  4:10 PM  Result Value Ref Range Status   C Diff antigen NEGATIVE NEGATIVE Final   C Diff toxin NEGATIVE NEGATIVE Final   C Diff interpretation No C. difficile detected.  Final    Comment: Performed at [facility], [place] [address] [place], [place] [address]  Culture, blood (routine x 2)     Status: None (Preliminary result)   Collection Time: 01/17/18  4:51 PM  Result Value Ref Range Status   Specimen Description   Final    BLOOD RIGHT ARM Performed at [facility] [address] [place], [place] [address]    Special Requests   Final    BOTTLES DRAWN AEROBIC AND ANAEROBIC Blood Culture adequate volume Performed at [facility] [address], [place], [place] [address]    Culture   Final    NO GROWTH < 24 HOURS Performed at [facility] [address], [place], [place] [address]    Report Status PENDING  Incomplete  Culture, blood (routine x 2)     Status: None (Preliminary result)   Collection Time: 01/17/18  4:56 PM  Result Value Ref Range Status   Specimen Description   Final    BLOOD RIGHT HAND Performed at [facility] [address], [place], [place] [address]    Special Requests   Final    BOTTLES DRAWN AEROBIC ONLY Blood Culture adequate volume Performed at [facility] [address], [place], [place] [address]    Culture   Final    NO GROWTH < 24 HOURS Performed at [facility] [place],  [place] [address]    Report Status PENDING  Incomplete         Radiology Studies: No results found.      Scheduled Meds: . chlorhexidine  15 mL Mouth Rinse BID  . dexamethasone  10 mg Intravenous Q6H  . famotidine  20 mg Oral  BID  . fluticasone  2 spray Each Nare Daily  . levothyroxine  75 mcg Oral QAC breakfast  . lisinopril  10 mg Oral Daily   Continuous Infusions: . sodium chloride 10 mL/hr at 01/18/18 0200  . cefTRIAXone (ROCEPHIN)  IV 2 g (01/18/18 1000)  . vancomycin 750 mg (01/18/18 1506)     LOS: 3 days     Vernell Leep, MD, FACP, [facility]. [facility] Pager [phone]  If 7PM-7AM, please contact night-coverage [email or web address] Password [email or web address] 01/18/2018, 4:04 PM

## 2018-01-18 NOTE — H&P (View-Only) (Signed)
INFECTIOUS DISEASE PROGRESS NOTE  ID: Emma Martin is a 37 y.o. female with  Principal Problem:   Sepsis ([place]) Active Problems:   HTN (hypertension)   Hypothyroidism   Generalized anxiety disorder   Hyponatremia   Hypokalemia   Streptococcal bacteremia   Pneumococcal meningitis   Nuchal rigidity  Subjective: No complaints, no headache or neck stiffness  Abtx:  Anti-infectives (From admission, onward)   Start     Dose/Rate Route Frequency Ordered Stop   01/16/18 1600  vancomycin (VANCOCIN) IVPB 750 mg/150 ml premix     750 mg 150 mL/hr over 60 Minutes Intravenous Every 12 hours 01/16/18 0138     01/16/18 0100  cefTRIAXone (ROCEPHIN) 2 g in sodium chloride 0.9 % 100 mL IVPB    Note to Pharmacy:  LP planned, please start after procedure.   2 g 200 mL/hr over 30 Minutes Intravenous Every 12 hours 01/15/18 2225     01/16/18 0030  vancomycin (VANCOCIN) 1,250 mg in sodium chloride 0.9 % 250 mL IVPB     1,250 mg 166.7 mL/hr over 90 Minutes Intravenous NOW 01/16/18 0019 01/16/18 0300   01/15/18 1815  ceFEPIme (MAXIPIME) 2 g in sodium chloride 0.9 % 100 mL IVPB     2 g 200 mL/hr over 30 Minutes Intravenous  Once 01/15/18 1805 01/15/18 1901   01/15/18 1815  metroNIDAZOLE (FLAGYL) IVPB 500 mg  Status:  Discontinued     500 mg 100 mL/hr over 60 Minutes Intravenous Every 8 hours 01/15/18 1805 01/15/18 2221   01/15/18 1815  vancomycin (VANCOCIN) IVPB 1000 mg/200 mL premix  Status:  Discontinued     1,000 mg 200 mL/hr over 60 Minutes Intravenous  Once 01/15/18 1805 01/15/18 2221      Medications:  Scheduled: . chlorhexidine  15 mL Mouth Rinse BID  . dexamethasone  10 mg Intravenous Q6H  . famotidine  20 mg Oral BID  . fluticasone  2 spray Each Nare Daily  . levothyroxine  75 mcg Oral QAC breakfast  . lisinopril  10 mg Oral Daily    Objective: Vital signs in last 24 hours: Temp:  [97.5 F (36.4 C)-98 F (36.7 C)] 97.5 F (36.4 C) (12/30 0551) Pulse Rate:  [79-87] 79  (12/30 1410) Resp:  [17-18] 17 (12/30 1410) BP: (129-136)/(85-99) 129/85 (12/30 1410) SpO2:  [98 %-100 %] 100 % (12/30 1410)   General appearance: alert, cooperative and no distress Neck: supple, symmetrical, trachea midline Resp: clear to auscultation bilaterally Cardio: regular rate and rhythm GI: normal findings: bowel sounds normal and soft, non-tender  Lab Results Recent Labs    01/16/18 0327  01/16/18 1649 01/17/18 0533  WBC 31.4*  --   --  25.1*  HGB 13.0  --   --  13.1  HCT 38.8  --   --  39.4  NA 130*   < > 135 137  K 3.0*   < > 3.9 3.8  CL 96*   < > 102 103  CO2 20*   < > 23 26  BUN 13   < > 12 12  CREATININE 0.40*   < > 0.32* 0.35*   < > = values in this interval not displayed.   Liver Panel Recent Labs    01/16/18 0327  PROT 5.7*  ALBUMIN 2.2*  AST 29  ALT 30  ALKPHOS 126  BILITOT 0.7   Sedimentation Rate No results for input(s): ESRSEDRATE in the last 72 hours. C-Reactive Protein No results for input(s):  CRP in the last 72 hours.  Microbiology: Recent Results (from the past 240 hour(s))  Blood Culture (routine x 2)     Status: Abnormal   Collection Time: 01/15/18  4:56 PM  Result Value Ref Range Status   Specimen Description   Final    BLOOD LEFT ANTECUBITAL Performed at [facility] [address], [place], [place] [address]    Special Requests   Final    BOTTLES DRAWN AEROBIC AND ANAEROBIC Blood Culture adequate volume Performed at [facility] [address], [place], [place] [address]    Culture  Setup Time   Final    GRAM POSITIVE COCCI IN BOTH AEROBIC AND ANAEROBIC BOTTLES CRITICAL VALUE NOTED.  VALUE IS CONSISTENT WITH PREVIOUSLY REPORTED AND CALLED VALUE.    Culture (A)  Final    STREPTOCOCCUS PNEUMONIAE SUSCEPTIBILITIES PERFORMED ON PREVIOUS CULTURE WITHIN THE LAST 5 DAYS. Performed at [facility], [place] [address], [place], [place] [address]    Report Status 01/18/2018 FINAL  Final    Blood Culture (routine x 2)     Status: Abnormal   Collection Time: 01/15/18  5:01 PM  Result Value Ref Range Status   Specimen Description   Final    BLOOD LEFT ANTECUBITAL Performed at [facility] [address] [place], [place] [address]    Special Requests   Final    BOTTLES DRAWN AEROBIC AND ANAEROBIC Blood Culture adequate volume Performed at [facility] [address], [place], [place] [address]    Culture  Setup Time   Final    GRAM POSITIVE COCCI IN BOTH AEROBIC AND ANAEROBIC BOTTLES CRITICAL RESULT CALLED TO, READ BACK BY AND VERIFIED WITH: RN [place], [place] [id number] FCP Performed at [facility], [place] [address], [place], [place] [address]    Culture STREPTOCOCCUS PNEUMONIAE (A)  Final   Report Status 01/18/2018 FINAL  Final   Organism ID, Bacteria STREPTOCOCCUS PNEUMONIAE  Final      Susceptibility   Streptococcus pneumoniae - MIC*    ERYTHROMYCIN >=8 RESISTANT Resistant     LEVOFLOXACIN 0.5 SENSITIVE Sensitive     VANCOMYCIN 0.5 SENSITIVE Sensitive     PENICILLIN (meningitis) 1 RESISTANT Resistant     PENICILLIN (non-meningitis) 1 SENSITIVE Sensitive     CEFTRIAXONE (non-meningitis) 1 SENSITIVE Sensitive     CEFTRIAXONE (meningitis) 1 INTERMEDIATE Intermediate     * STREPTOCOCCUS PNEUMONIAE  Blood Culture ID Panel (Reflexed)     Status: Abnormal   Collection Time: 01/15/18  5:01 PM  Result Value Ref Range Status   Enterococcus species NOT DETECTED NOT DETECTED Final   Listeria monocytogenes NOT DETECTED NOT DETECTED Final   Staphylococcus species NOT DETECTED NOT DETECTED Final   Staphylococcus aureus (BCID) NOT DETECTED NOT DETECTED Final   Streptococcus species DETECTED (A) NOT DETECTED Final    Comment: CRITICAL RESULT CALLED TO, READ BACK BY AND VERIFIED WITH: RN HAKINGS, E 0727 [phone] FCP    Streptococcus agalactiae NOT DETECTED NOT DETECTED Final   Streptococcus pneumoniae DETECTED (A) NOT DETECTED Final     Comment: CRITICAL RESULT CALLED TO, READ BACK BY AND VERIFIED WITH: RN HAKINGS, E 0727 [phone] FCP    Streptococcus pyogenes NOT DETECTED NOT DETECTED Final   Acinetobacter baumannii NOT DETECTED NOT DETECTED Final   Enterobacteriaceae species NOT DETECTED NOT DETECTED Final   Enterobacter cloacae complex NOT DETECTED NOT DETECTED Final   Escherichia coli NOT DETECTED NOT DETECTED Final  Klebsiella oxytoca NOT DETECTED NOT DETECTED Final   Klebsiella pneumoniae NOT DETECTED NOT DETECTED Final   Proteus species NOT DETECTED NOT DETECTED Final   Serratia marcescens NOT DETECTED NOT DETECTED Final   Haemophilus influenzae NOT DETECTED NOT DETECTED Final   Neisseria meningitidis NOT DETECTED NOT DETECTED Final   Pseudomonas aeruginosa NOT DETECTED NOT DETECTED Final   Candida albicans NOT DETECTED NOT DETECTED Final   Candida glabrata NOT DETECTED NOT DETECTED Final   Candida krusei NOT DETECTED NOT DETECTED Final   Candida parapsilosis NOT DETECTED NOT DETECTED Final   Candida tropicalis NOT DETECTED NOT DETECTED Final    Comment: Performed at [facility], [place] [address], [place], [place] [address]  CSF culture     Status: Abnormal   Collection Time: 01/15/18 11:51 PM  Result Value Ref Range Status   Specimen Description   Final    CSF Performed at [facility] [address] [place] [address] [place], [place] [address]    Special Requests   Final    NONE Performed at [facility], [place] [address] [place], [place] [address]    Gram Stain   Final    WBC PRESENT, PREDOMINANTLY PMN GRAM POSITIVE COCCI CRITICAL RESULT CALLED TO, READ BACK BY AND VERIFIED WITH: ANN HAWKINS AT 0345 ON 01/16/18 BY A,MOHAMED  CYTOSPIN SMEAR Performed at [facility] [address], [place], [place] [address]    Culture STREPTOCOCCUS PNEUMONIAE (A)  Final   Report Status 01/18/2018 FINAL  Final   Organism ID, Bacteria STREPTOCOCCUS PNEUMONIAE  Final       Susceptibility   Streptococcus pneumoniae - MIC*    ERYTHROMYCIN >=8 RESISTANT Resistant     LEVOFLOXACIN 0.5 SENSITIVE Sensitive     VANCOMYCIN 0.5 SENSITIVE Sensitive     PENICILLIN (meningitis) 1 RESISTANT Resistant     PENICILLIN (non-meningitis) 1 SENSITIVE Sensitive     CEFTRIAXONE (non-meningitis) 1 SENSITIVE Sensitive     CEFTRIAXONE (meningitis) 1 INTERMEDIATE Intermediate     * STREPTOCOCCUS PNEUMONIAE  MRSA PCR Screening     Status: None   Collection Time: 01/16/18 12:53 AM  Result Value Ref Range Status   MRSA by PCR NEGATIVE NEGATIVE Final    Comment:        The GeneXpert MRSA Assay (FDA approved for NASAL specimens only), is one component of a comprehensive MRSA colonization surveillance program. It is not intended to diagnose MRSA infection nor to guide or monitor treatment for MRSA infections. Performed at [facility], [place] [address], [place], [place] [address]   C difficile quick scan w PCR reflex     Status: None   Collection Time: 01/16/18  4:10 PM  Result Value Ref Range Status   C Diff antigen NEGATIVE NEGATIVE Final   C Diff toxin NEGATIVE NEGATIVE Final   C Diff interpretation No C. difficile detected.  Final    Comment: Performed at [facility], [place] [address], [place], [place] [address]  Culture, blood (routine x 2)     Status: None (Preliminary result)   Collection Time: 01/17/18  4:51 PM  Result Value Ref Range Status   Specimen Description   Final    BLOOD RIGHT ARM Performed at [facility] [address], [place], [place] [address]    Special Requests   Final    BOTTLES DRAWN AEROBIC AND ANAEROBIC Blood Culture adequate volume Performed at [facility] [address]  [place], [place] [address]    Culture   Final    NO GROWTH < 24 HOURS Performed at [facility], [place] [address] [place], [place] [address]    Report Status PENDING  Incomplete  Culture,  blood (routine x 2)     Status: None (Preliminary result)   Collection Time: 01/17/18  4:56 PM  Result Value Ref Range Status   Specimen Description   Final    BLOOD RIGHT HAND Performed at [facility] [address] [place], [place] [address]    Special Requests   Final    BOTTLES DRAWN AEROBIC ONLY Blood Culture adequate volume Performed at [facility] [address], [place], [place] [address]    Culture   Final    NO GROWTH < 24 HOURS Performed at [facility] [address] [place], [place] [address]    Report Status PENDING  Incomplete    Studies/Results: No results found.   Assessment/Plan: Pneumococcal bacteremia and meningitis Turner Syndrome Bicuspid Ao valve  Total days of antibiotics: 3 vanco/ceftriaxone/steroids         Stop steroids at day 4 Await TEE, repeat BCX Place midline if ok with IV team. Will need > 1 week of IV vanco.  opat ordered.  Plan for 14 days of vanco/ceftriaxone due to sensitivity pattern of strep.  Explained to pt and father.   Allergies  Allergen Reactions  . Benzoin Rash    OPAT Orders Discharge antibiotics: ceftriaxone 2g IVPB q12h Per pharmacy protocol vanco Aim for Vancomycin trough 15-20 (unless otherwise indicated) Duration: 10 days End Date: 01-29-2018  [facility] Care Per Protocol: please  Labs weekly while on IV antibiotics: _x_ CBC with differential __ BMP _x_ CMP __ CRP __ ESR _x_ Vancomycin trough __ CK  _x_ Please pull PIC at completion of IV antibiotics __ Please leave PIC in place until doctor has seen patient or been notified  Fax weekly labs to [phone]  Clinic Follow Up Appt: PCP   Bobby Rumpf MD, FACP Infectious Diseases (pager) [phone] [email or web address] 01/18/2018, 7:00 PM  LOS: 3 days

## 2018-01-18 NOTE — Progress Notes (Addendum)
INFECTIOUS DISEASE PROGRESS NOTE  ID: Emma Martin is a 37 y.o. female with  Principal Problem:   Sepsis ([place]) Active Problems:   HTN (hypertension)   Hypothyroidism   Generalized anxiety disorder   Hyponatremia   Hypokalemia   Streptococcal bacteremia   Pneumococcal meningitis   Nuchal rigidity  Subjective: No complaints, no headache or neck stiffness  Abtx:  Anti-infectives (From admission, onward)   Start     Dose/Rate Route Frequency Ordered Stop   01/16/18 1600  vancomycin (VANCOCIN) IVPB 750 mg/150 ml premix     750 mg 150 mL/hr over 60 Minutes Intravenous Every 12 hours 01/16/18 0138     01/16/18 0100  cefTRIAXone (ROCEPHIN) 2 g in sodium chloride 0.9 % 100 mL IVPB    Note to Pharmacy:  LP planned, please start after procedure.   2 g 200 mL/hr over 30 Minutes Intravenous Every 12 hours 01/15/18 2225     01/16/18 0030  vancomycin (VANCOCIN) 1,250 mg in sodium chloride 0.9 % 250 mL IVPB     1,250 mg 166.7 mL/hr over 90 Minutes Intravenous NOW 01/16/18 0019 01/16/18 0300   01/15/18 1815  ceFEPIme (MAXIPIME) 2 g in sodium chloride 0.9 % 100 mL IVPB     2 g 200 mL/hr over 30 Minutes Intravenous  Once 01/15/18 1805 01/15/18 1901   01/15/18 1815  metroNIDAZOLE (FLAGYL) IVPB 500 mg  Status:  Discontinued     500 mg 100 mL/hr over 60 Minutes Intravenous Every 8 hours 01/15/18 1805 01/15/18 2221   01/15/18 1815  vancomycin (VANCOCIN) IVPB 1000 mg/200 mL premix  Status:  Discontinued     1,000 mg 200 mL/hr over 60 Minutes Intravenous  Once 01/15/18 1805 01/15/18 2221      Medications:  Scheduled: . chlorhexidine  15 mL Mouth Rinse BID  . dexamethasone  10 mg Intravenous Q6H  . famotidine  20 mg Oral BID  . fluticasone  2 spray Each Nare Daily  . levothyroxine  75 mcg Oral QAC breakfast  . lisinopril  10 mg Oral Daily    Objective: Vital signs in last 24 hours: Temp:  [97.5 F (36.4 C)-98 F (36.7 C)] 97.5 F (36.4 C) (12/30 0551) Pulse Rate:  [79-87] 79  (12/30 1410) Resp:  [17-18] 17 (12/30 1410) BP: (129-136)/(85-99) 129/85 (12/30 1410) SpO2:  [98 %-100 %] 100 % (12/30 1410)   General appearance: alert, cooperative and no distress Neck: supple, symmetrical, trachea midline Resp: clear to auscultation bilaterally Cardio: regular rate and rhythm GI: normal findings: bowel sounds normal and soft, non-tender  Lab Results Recent Labs    01/16/18 0327  01/16/18 1649 01/17/18 0533  WBC 31.4*  --   --  25.1*  HGB 13.0  --   --  13.1  HCT 38.8  --   --  39.4  NA 130*   < > 135 137  K 3.0*   < > 3.9 3.8  CL 96*   < > 102 103  CO2 20*   < > 23 26  BUN 13   < > 12 12  CREATININE 0.40*   < > 0.32* 0.35*   < > = values in this interval not displayed.   Liver Panel Recent Labs    01/16/18 0327  PROT 5.7*  ALBUMIN 2.2*  AST 29  ALT 30  ALKPHOS 126  BILITOT 0.7   Sedimentation Rate No results for input(s): ESRSEDRATE in the last 72 hours. C-Reactive Protein No results for input(s):  CRP in the last 72 hours.  Microbiology: Recent Results (from the past 240 hour(s))  Blood Culture (routine x 2)     Status: Abnormal   Collection Time: 01/15/18  4:56 PM  Result Value Ref Range Status   Specimen Description   Final    BLOOD LEFT ANTECUBITAL Performed at [facility] [address], [place], [place] [address]    Special Requests   Final    BOTTLES DRAWN AEROBIC AND ANAEROBIC Blood Culture adequate volume Performed at [facility] [address], [place], [place] [address]    Culture  Setup Time   Final    GRAM POSITIVE COCCI IN BOTH AEROBIC AND ANAEROBIC BOTTLES CRITICAL VALUE NOTED.  VALUE IS CONSISTENT WITH PREVIOUSLY REPORTED AND CALLED VALUE.    Culture (A)  Final    STREPTOCOCCUS PNEUMONIAE SUSCEPTIBILITIES PERFORMED ON PREVIOUS CULTURE WITHIN THE LAST 5 DAYS. Performed at [facility], [place] [address], [place], [place] [address]    Report Status 01/18/2018 FINAL  Final    Blood Culture (routine x 2)     Status: Abnormal   Collection Time: 01/15/18  5:01 PM  Result Value Ref Range Status   Specimen Description   Final    BLOOD LEFT ANTECUBITAL Performed at [facility] [address] [place], [place] [address]    Special Requests   Final    BOTTLES DRAWN AEROBIC AND ANAEROBIC Blood Culture adequate volume Performed at [facility] [address] [place], [place] [address]    Culture  Setup Time   Final    GRAM POSITIVE COCCI IN BOTH AEROBIC AND ANAEROBIC BOTTLES CRITICAL RESULT CALLED TO, READ BACK BY AND VERIFIED WITH: RN [place], [place] [phone] FCP Performed at [facility], [place] [address], [place], [place] [address]    Culture STREPTOCOCCUS PNEUMONIAE (A)  Final   Report Status 01/18/2018 FINAL  Final   Organism ID, Bacteria STREPTOCOCCUS PNEUMONIAE  Final      Susceptibility   Streptococcus pneumoniae - MIC*    ERYTHROMYCIN >=8 RESISTANT Resistant     LEVOFLOXACIN 0.5 SENSITIVE Sensitive     VANCOMYCIN 0.5 SENSITIVE Sensitive     PENICILLIN (meningitis) 1 RESISTANT Resistant     PENICILLIN (non-meningitis) 1 SENSITIVE Sensitive     CEFTRIAXONE (non-meningitis) 1 SENSITIVE Sensitive     CEFTRIAXONE (meningitis) 1 INTERMEDIATE Intermediate     * STREPTOCOCCUS PNEUMONIAE  Blood Culture ID Panel (Reflexed)     Status: Abnormal   Collection Time: 01/15/18  5:01 PM  Result Value Ref Range Status   Enterococcus species NOT DETECTED NOT DETECTED Final   Listeria monocytogenes NOT DETECTED NOT DETECTED Final   Staphylococcus species NOT DETECTED NOT DETECTED Final   Staphylococcus aureus (BCID) NOT DETECTED NOT DETECTED Final   Streptococcus species DETECTED (A) NOT DETECTED Final    Comment: CRITICAL RESULT CALLED TO, READ BACK BY AND VERIFIED WITH: RN HAKINGS, E 0727 [phone] FCP    Streptococcus agalactiae NOT DETECTED NOT DETECTED Final   Streptococcus pneumoniae DETECTED (A) NOT DETECTED Final     Comment: CRITICAL RESULT CALLED TO, READ BACK BY AND VERIFIED WITH: RN HAKINGS, E 0727 [id number] FCP    Streptococcus pyogenes NOT DETECTED NOT DETECTED Final   Acinetobacter baumannii NOT DETECTED NOT DETECTED Final   Enterobacteriaceae species NOT DETECTED NOT DETECTED Final   Enterobacter cloacae complex NOT DETECTED NOT DETECTED Final   Escherichia coli NOT DETECTED NOT DETECTED Final  Klebsiella oxytoca NOT DETECTED NOT DETECTED Final   Klebsiella pneumoniae NOT DETECTED NOT DETECTED Final   Proteus species NOT DETECTED NOT DETECTED Final   Serratia marcescens NOT DETECTED NOT DETECTED Final   Haemophilus influenzae NOT DETECTED NOT DETECTED Final   Neisseria meningitidis NOT DETECTED NOT DETECTED Final   Pseudomonas aeruginosa NOT DETECTED NOT DETECTED Final   Candida albicans NOT DETECTED NOT DETECTED Final   Candida glabrata NOT DETECTED NOT DETECTED Final   Candida krusei NOT DETECTED NOT DETECTED Final   Candida parapsilosis NOT DETECTED NOT DETECTED Final   Candida tropicalis NOT DETECTED NOT DETECTED Final    Comment: Performed at [facility], [place] [address] [place], [place] [address]  CSF culture     Status: Abnormal   Collection Time: 01/15/18 11:51 PM  Result Value Ref Range Status   Specimen Description   Final    CSF Performed at [facility] [address] [place], [place] [address]    Special Requests   Final    NONE Performed at [facility], [place] [address], [place], [place] [address]    Gram Stain   Final    WBC PRESENT, PREDOMINANTLY PMN GRAM POSITIVE COCCI CRITICAL RESULT CALLED TO, READ BACK BY AND VERIFIED WITH: ANN HAWKINS AT 0345 ON 01/16/18 BY A,MOHAMED  CYTOSPIN SMEAR Performed at [facility] [address] [place], [place] [address]    Culture STREPTOCOCCUS PNEUMONIAE (A)  Final   Report Status 01/18/2018 FINAL  Final   Organism ID, Bacteria STREPTOCOCCUS PNEUMONIAE  Final       Susceptibility   Streptococcus pneumoniae - MIC*    ERYTHROMYCIN >=8 RESISTANT Resistant     LEVOFLOXACIN 0.5 SENSITIVE Sensitive     VANCOMYCIN 0.5 SENSITIVE Sensitive     PENICILLIN (meningitis) 1 RESISTANT Resistant     PENICILLIN (non-meningitis) 1 SENSITIVE Sensitive     CEFTRIAXONE (non-meningitis) 1 SENSITIVE Sensitive     CEFTRIAXONE (meningitis) 1 INTERMEDIATE Intermediate     * STREPTOCOCCUS PNEUMONIAE  MRSA PCR Screening     Status: None   Collection Time: 01/16/18 12:53 AM  Result Value Ref Range Status   MRSA by PCR NEGATIVE NEGATIVE Final    Comment:        The GeneXpert MRSA Assay (FDA approved for NASAL specimens only), is one component of a comprehensive MRSA colonization surveillance program. It is not intended to diagnose MRSA infection nor to guide or monitor treatment for MRSA infections. Performed at [facility], [place] [address], [place], [place] [address]   C difficile quick scan w PCR reflex     Status: None   Collection Time: 01/16/18  4:10 PM  Result Value Ref Range Status   C Diff antigen NEGATIVE NEGATIVE Final   C Diff toxin NEGATIVE NEGATIVE Final   C Diff interpretation No C. difficile detected.  Final    Comment: Performed at [facility], [place] [address], [place], [place] [address]  Culture, blood (routine x 2)     Status: None (Preliminary result)   Collection Time: 01/17/18  4:51 PM  Result Value Ref Range Status   Specimen Description   Final    BLOOD RIGHT ARM Performed at [facility] [address], [place],  [address]    Special Requests   Final    BOTTLES DRAWN AEROBIC AND ANAEROBIC Blood Culture adequate volume Performed at [facility] [address]  [place], [place] [address]    Culture   Final    NO GROWTH < 24 HOURS Performed at [facility], [place] [address], [place], [place] [address]    Report Status PENDING  Incomplete  Culture,  blood (routine x 2)     Status: None (Preliminary result)   Collection Time: 01/17/18  4:56 PM  Result Value Ref Range Status   Specimen Description   Final    BLOOD RIGHT HAND Performed at [facility] [address], [place], [place] [address]    Special Requests   Final    BOTTLES DRAWN AEROBIC ONLY Blood Culture adequate volume Performed at [facility] [address] [place], [place] [address]    Culture   Final    NO GROWTH < 24 HOURS Performed at [facility] [address], [place], [place] [address]    Report Status PENDING  Incomplete    Studies/Results: No results found.   Assessment/Plan: Pneumococcal bacteremia and meningitis Turner Syndrome Bicuspid Ao valve  Total days of antibiotics: 3 vanco/ceftriaxone/steroids         Stop steroids at day 4 Await TEE, repeat BCX Place midline if ok with IV team. Will need > 1 week of IV vanco.  opat ordered.  Plan for 14 days of vanco/ceftriaxone due to sensitivity pattern of strep.  Explained to pt and father.   Allergies  Allergen Reactions  . Benzoin Rash    OPAT Orders Discharge antibiotics: ceftriaxone 2g IVPB q12h Per pharmacy protocol vanco Aim for Vancomycin trough 15-20 (unless otherwise indicated) Duration: 10 days End Date: 01-29-2018  [facility] Care Per Protocol: please  Labs weekly while on IV antibiotics: _x_ CBC with differential __ BMP _x_ CMP __ CRP __ ESR _x_ Vancomycin trough __ CK  _x_ Please pull PIC at completion of IV antibiotics __ Please leave PIC in place until doctor has seen patient or been notified  Fax weekly labs to [phone]  Clinic Follow Up Appt: PCP   Bobby Rumpf MD, FACP Infectious Diseases (pager) [phone] [email or web address] 01/18/2018, 7:00 PM  LOS: 3 days

## 2018-01-18 NOTE — Evaluation (Signed)
Physical Therapy Evaluation Patient Details Name: Emma Martin MRN: [id number] DOB: [birth year] Today's Date: 01/18/2018   History of Present Illness  37 yo female admitted to ED on 12/27 with sepsis secondary to pneumococcal acute bacterial meningitis with bacteremia. Pt had been experiences N/V, diarrhea, weakness, and falls a week PTA. PMH includes Turner syndrome, bicuspid valve insufficiency, anxiety, HTN, scoliosis with Harrington rods 1997.   Clinical Impression   Pt presents with LE weakness, decreased endurance for ambulation, and difficulty performing mobility tasks. Pt to benefit from acute PT to address deficits. Pt ambulated 130 ft hallway ambulation with RW for UE steadying. Pt anxious about mobility, and was more timid during ambulation without RW. PT recommending OPPT to address deficits once d/c from hospital setting, pt will potentially need HHPT pending pt progress. Pt's father, who lives with pt, is at bedside and very supportive. PT to progress mobility as tolerated, and will continue to follow acutely.      Follow Up Recommendations Outpatient PT;Supervision for mobility/OOB    Equipment Recommendations  None recommended by PT(Will reassess next session )    Recommendations for Other Services       Precautions / Restrictions Precautions Precautions: Fall Restrictions Weight Bearing Restrictions: No      Mobility  Bed Mobility Overal bed mobility: Needs Assistance Bed Mobility: Supine to Sit;Rolling;Sidelying to Sit Rolling: Supervision Sidelying to sit: Min assist       General bed mobility comments: Supervision for rolling, min assist for sidelying to sit for trunk elevation. Pt effortful with mobility, and requires increased time.   Transfers Overall transfer level: Needs assistance Equipment used: 2 person hand held assist Transfers: Sit to/from Stand Sit to Stand: Min assist         General transfer comment: Min assist with HHA+2 for power  up and steadying. Pt not confident with mobility, reaching for environment after standing.   Ambulation/Gait Ambulation/Gait assistance: Min guard Gait Distance (Feet): 150 Feet   Gait Pattern/deviations: Step-through pattern;Decreased stride length Gait velocity: decr   General Gait Details: Min guard for safety. PT initially had pt ambulating with use of IV pole for steadying, but pt felt more comfortable with RW use at this time. Pt ambulated 130 ft in hallway, and ambulated 20 ft to and from bathroom. Pt with BM, PT assisting in pericare per pt request.  Stairs            Wheelchair Mobility    Modified Rankin (Stroke Patients Only)       Balance Overall balance assessment: Needs assistance Sitting-balance support: No upper extremity supported;Feet unsupported Sitting balance-Leahy Scale: Good     Standing balance support: Bilateral upper extremity supported Standing balance-Leahy Scale: Poor Standing balance comment: Pt anxious and relies on RW for steadying                              Pertinent Vitals/Pain Pain Assessment: No/denies pain    Home Living Family/patient expects to be discharged to:: Private residence Living Arrangements: Parent Available Help at Discharge: Family;Available 24 hours/day Type of Home: House Home Access: Stairs to enter Entrance Stairs-Rails: Psychiatric nurse of Steps: 2 Home Layout: Two level;Bed/bath upstairs Home Equipment: Cane - quad;Other (comment)(stair lift )      Prior Function Level of Independence: Independent         Comments: Pt and pt's father state they enjoy cooking together, but otherwise pt is independent. Pt drives,  not currently employed.      Hand Dominance   Dominant Hand: Right    Extremity/Trunk Assessment   Upper Extremity Assessment Upper Extremity Assessment: Overall WFL for tasks assessed    Lower Extremity Assessment Lower Extremity Assessment:  Generalized weakness    Cervical / Trunk Assessment Cervical / Trunk Assessment: Normal  Communication   Communication: No difficulties;HOH(HOH due to sinus congestion, per father)  Cognition Arousal/Alertness: Awake/alert Behavior During Therapy: Anxious Overall Cognitive Status: Within Functional Limits for tasks assessed                                 General Comments: Pt anxious about mobility and scared she would not be able to walk prior to mobility.       General Comments General comments (skin integrity, edema, etc.): Pt's father discussed pt's neck stiffness secondary to meningitis. Neck stiffness now resolving, some still present, noted with mobility.     Exercises     Assessment/Plan    PT Assessment Patient needs continued PT services  PT Problem List Decreased strength;Decreased activity tolerance;Decreased knowledge of use of DME;Decreased balance;Decreased mobility       PT Treatment Interventions DME instruction;Therapeutic activities;Gait training;Therapeutic exercise;Patient/family education;Stair training;Balance training;Functional mobility training    PT Goals (Current goals can be found in the Care Plan section)  Acute Rehab PT Goals Patient Stated Goal: return to PLOF PT Goal Formulation: With patient Time For Goal Achievement: 02/01/18 Potential to Achieve Goals: Good    Frequency Min 3X/week   Barriers to discharge        Co-evaluation               AM-PAC PT "6 Clicks" Mobility  Outcome Measure Help needed turning from your back to your side while in a flat bed without using bedrails?: A Little Help needed moving from lying on your back to sitting on the side of a flat bed without using bedrails?: A Little Help needed moving to and from a bed to a chair (including a wheelchair)?: A Little Help needed standing up from a chair using your arms (e.g., wheelchair or bedside chair)?: A Little Help needed to walk in hospital  room?: A Little Help needed climbing 3-5 steps with a railing? : A Little 6 Click Score: 18    End of Session Equipment Utilized During Treatment: Gait belt Activity Tolerance: Patient tolerated treatment well;Patient limited by fatigue Patient left: in chair;with call bell/phone within reach;with family/visitor present Nurse Communication: Mobility status PT Visit Diagnosis: Other abnormalities of gait and mobility (R26.89);Difficulty in walking, not elsewhere classified (R26.2)    Time: 1600-1630 PT Time Calculation (min) (ACUTE ONLY): 30 min   Charges:   PT Evaluation $PT Eval Low Complexity: 1 Low PT Treatments $Gait Training: 8-22 mins        Julien Girt, PT Acute Rehabilitation Services Pager [phone]  Office [phone]   Janee Ureste D Elonda Husky 01/18/2018, 5:29 PM

## 2018-01-18 NOTE — Progress Notes (Signed)
    [facility] has been requested to perform a transesophageal echocardiogram on Emma Martin for meningitis bacteremia.  After careful review of history and examination, the risks and benefits of transesophageal echocardiogram have been explained including risks of esophageal damage, perforation (1:10,000 risk), bleeding, pharyngeal hematoma as well as other potential complications associated with conscious sedation including aspiration, arrhythmia, respiratory failure and death. Alternatives to treatment were discussed, questions were answered. Patient is willing to proceed. Family was with pt on instructions.    Cecilie Kicks, NP  01/18/2018 11:31 AM

## 2018-01-19 ENCOUNTER — Encounter (HOSPITAL_COMMUNITY): Payer: Self-pay

## 2018-01-19 ENCOUNTER — Inpatient Hospital Stay (HOSPITAL_COMMUNITY): Payer: Medicaid Other

## 2018-01-19 ENCOUNTER — Encounter (HOSPITAL_COMMUNITY)
Admission: EM | Disposition: A | Discharge status: Home Health | Payer: Self-pay | Source: Home / Self Care | Attending: Internal Medicine

## 2018-01-19 ENCOUNTER — Inpatient Hospital Stay: Payer: Self-pay

## 2018-01-19 DIAGNOSIS — Q969 Turner's syndrome, unspecified: Secondary | ICD-10-CM

## 2018-01-19 DIAGNOSIS — I351 Nonrheumatic aortic (valve) insufficiency: Secondary | ICD-10-CM

## 2018-01-19 DIAGNOSIS — I340 Nonrheumatic mitral (valve) insufficiency: Secondary | ICD-10-CM

## 2018-01-19 HISTORY — PX: TEE WITHOUT CARDIOVERSION: SHX5443

## 2018-01-19 LAB — BASIC METABOLIC PANEL
ANION GAP: 9 (ref 5–15)
BUN: 18 mg/dL (ref 6–20)
CO2: 25 mmol/L (ref 22–32)
Calcium: 7.5 mg/dL — ABNORMAL LOW (ref 8.9–10.3)
Chloride: 104 mmol/L (ref 98–111)
Creatinine, Ser: 0.42 mg/dL — ABNORMAL LOW (ref 0.44–1.00)
GFR calc Af Amer: >60 mL/min (ref >60)
GFR calc non Af Amer: >60 mL/min (ref >60)
GLUCOSE: 146 mg/dL — AB (ref 70–99)
Potassium: 3.4 mmol/L — ABNORMAL LOW (ref 3.5–5.1)
Sodium: 138 mmol/L (ref 135–145)

## 2018-01-19 LAB — CBC
HCT: 36.5 % (ref 36.0–46.0)
Hemoglobin: 12 g/dL (ref 12.0–15.0)
MCH: 28.2 pg (ref 26.0–34.0)
MCHC: 32.9 g/dL (ref 30.0–36.0)
MCV: 85.9 fL (ref 80.0–100.0)
Platelets: 233 10*3/uL (ref 150–400)
RBC: 4.25 MIL/uL (ref 3.87–5.11)
RDW: 13.2 % (ref 11.5–15.5)
WBC: 21.2 10*3/uL — ABNORMAL HIGH (ref 4.0–10.5)
nRBC: 0 % (ref 0.0–0.2)

## 2018-01-19 LAB — PROTIME-INR
INR: 1.21
PROTHROMBIN TIME: 15.2 s (ref 11.4–15.2)

## 2018-01-19 LAB — VANCOMYCIN, PEAK: Vancomycin Pk: 17 ug/mL — ABNORMAL LOW (ref 30–40)

## 2018-01-19 LAB — VANCOMYCIN, TROUGH: Vancomycin Tr: 8 ug/mL — ABNORMAL LOW (ref 15–20)

## 2018-01-19 SURGERY — ECHOCARDIOGRAM, TRANSESOPHAGEAL
Anesthesia: Moderate Sedation

## 2018-01-19 MED ORDER — MIDAZOLAM HCL (PF) 5 MG/ML IJ SOLN
INTRAMUSCULAR | Status: AC
  Filled 2018-01-19: qty 2

## 2018-01-19 MED ORDER — MIDAZOLAM HCL (PF) 10 MG/2ML IJ SOLN
INTRAMUSCULAR | Status: DC | PRN
  Administered 2018-01-19: 2 mg via INTRAVENOUS
  Administered 2018-01-19 (×3): 1 mg via INTRAVENOUS

## 2018-01-19 MED ORDER — FENTANYL CITRATE (PF) 100 MCG/2ML IJ SOLN
INTRAMUSCULAR | Status: DC | PRN
  Administered 2018-01-19: 12.5 ug via INTRAVENOUS
  Administered 2018-01-19: 25 ug via INTRAVENOUS
  Administered 2018-01-19 (×2): 12.5 ug via INTRAVENOUS

## 2018-01-19 MED ORDER — BUTAMBEN-TETRACAINE-BENZOCAINE 2-2-14 % EX AERO
INHALATION_SPRAY | CUTANEOUS | Status: DC | PRN
  Administered 2018-01-19: 2 via TOPICAL

## 2018-01-19 MED ORDER — VANCOMYCIN IV (FOR PTA / DISCHARGE USE ONLY): 1000.0000 mg | Freq: Three times a day (TID) | INTRAVENOUS | 0 refills | Status: DC

## 2018-01-19 MED ORDER — FENTANYL CITRATE (PF) 100 MCG/2ML IJ SOLN
INTRAMUSCULAR | Status: AC
  Filled 2018-01-19: qty 4

## 2018-01-19 MED ORDER — POTASSIUM CHLORIDE CRYS ER 20 MEQ PO TBCR
40.0000 meq | EXTENDED_RELEASE_TABLET | Freq: Once | ORAL | Status: AC
  Administered 2018-01-19: 40 meq via ORAL
  Filled 2018-01-19: qty 2

## 2018-01-19 MED ORDER — DIPHENHYDRAMINE HCL 50 MG/ML IJ SOLN
INTRAMUSCULAR | Status: AC
  Filled 2018-01-19: qty 1

## 2018-01-19 MED ORDER — VANCOMYCIN HCL IN DEXTROSE 1-5 GM/200ML-% IV SOLN
1000.0000 mg | Freq: Three times a day (TID) | INTRAVENOUS | Status: DC
  Administered 2018-01-19 – 2018-01-20 (×4): 1000 mg via INTRAVENOUS
  Filled 2018-01-19 (×4): qty 200

## 2018-01-19 MED ORDER — CEFTRIAXONE IV (FOR PTA / DISCHARGE USE ONLY): 2.0000 g | Freq: Two times a day (BID) | INTRAVENOUS | 0 refills | Status: DC

## 2018-01-19 MED ORDER — SODIUM CHLORIDE 0.9 % IV SOLN
INTRAVENOUS | Status: DC
  Administered 2018-01-19: 09:00:00 via INTRAVENOUS

## 2018-01-19 NOTE — Progress Notes (Signed)
  Echocardiogram Echocardiogram Transesophageal has been performed.  Emma Martin 01/19/2018, 11:21 AM

## 2018-01-19 NOTE — Interval H&P Note (Signed)
History and Physical Interval Note:  01/19/2018 9:21 AM  Emma Martin  has presented today for surgery, with the diagnosis of MENINGITIS  The various methods of treatment have been discussed with the patient and family. After consideration of risks, benefits and other options for treatment, the patient has consented to  Procedure(s): TRANSESOPHAGEAL ECHOCARDIOGRAM (TEE) (N/A) as a surgical intervention .  The patient's history has been reviewed, patient examined, no change in status, stable for surgery.  I have reviewed the patient's chart and labs.  Questions were answered to the patient's satisfaction.     [employer]

## 2018-01-19 NOTE — Progress Notes (Signed)
PHARMACY CONSULT NOTE FOR:  OUTPATIENT  PARENTERAL ANTIBIOTIC THERAPY (OPAT)  Indication: Meningitis Regimen: Rocephin 2gm IV q12                  Vancomycin 1gm q8h - to deliver calculated Cmin of 15 mcg/ml End date: 01/29/2018  IV antibiotic discharge orders are pended. To discharging provider:  please sign these orders via discharge navigator,  Select New Orders & click on the button choice - Manage This Unsigned Work.     Thank you for allowing pharmacy to be a part of this patient's care.  Minda Ditto 01/19/2018, 2:41 PM

## 2018-01-19 NOTE — Progress Notes (Signed)
PROGRESS NOTE   Emma Martin  GEX:[id number]    DOB: 09/20/1980    DOA: 01/15/2018  PCP: Maurice Small, MD   I have briefly reviewed patients previous medical records in [facility].  Brief Narrative:  37 year old female patient, lives with her father, independent, PMH of Turner syndrome, HTN, hypothyroid, bicuspid aortic valve, GAD, presented to [facility] ED on 01/15/2018 with 1 week history of headache and neck pain followed several days later by nausea, vomiting, dizziness, generalized weakness and malaise, mild diarrhea and RUQ pain, dyspnea, denied fever but had chills.  Hypothermic/temperature 96.6 rectally in ED, marked neutrophilic [id number]/03.4, positive for neck stiffness.  Admitted to stepdown unit for pneumococcal acute bacterial meningitis with bacteremia.  Infectious disease consulting.  C. difficile negative.  TTE technically difficult study but negative for vegetations.  Transferred to medical floor.  Status post TEE 12/31, no vegetations.  PICC line ordered.   Assessment & Plan:   Principal Problem:   Sepsis ([place]) Active Problems:   HTN (hypertension)   Hypothyroidism   Generalized anxiety disorder   Hyponatremia   Hypokalemia   Streptococcal bacteremia   Pneumococcal meningitis   Nuchal rigidity   Turner syndrome   1. Sepsis due to invasive pneumococcal disease with meningitis with bacteremia: Met sepsis criteria on admission (hypothermia, tachycardia, leukocytosis, neck stiffness).  Urine microscopy, chest x-ray, CT abdomen and pelvis with contrast and RUQ ultrasound without acute findings.  MRSA PCR negative. Underwent LP in the ED. CT head without acute findings.  She received a dose of cefepime and metronidazole prior to CSF collection.  Currently on meningitic doses of IV ceftriaxone and vancomycin, continue.  Completing IV Decadron 10 mg every 6 hourly x4 days.  TTE technically difficult study but no vegetations.  Droplet isolation  discontinued.  CSF and blood culture confirms strep pneumonia but intermediate sensitivity to ceftriaxone for meningitis.  ID following. Surveillance blood cultures x2: Negative to date.  TEE 12/31 without vegetations.  As per ID recommendations, plan 14 days of vancomycin + ceftriaxone due to sensitivity pattern of strep pneumonia, and date 01/29/2018.  Clinically improved.  Neck stiffness seems to have resolved. 2. Hypokalemia: Replace and follow in a.m.  Magnesium 2.3. 3. Hyponatremia: May be due to dehydration due to poor oral intake, GI losses, sepsis and due to HCTZ.  Presented with sodium of 125 which which gradually improved over >36 hours to 137.  DC IV fluids.  Tolerating diet. 4. Leukocytosis: Secondary to sepsis.  WBC was 41.  Continues to gradually improve, down to 21 today. 5. Thrombocytopenia: Likely related to sepsis.  No bleeding reported.  Resolved. 6. Essential hypertension: Held lisinopril-HCTZ.  IV PRN hydralazine.  Initiated lisinopril 10 mg alone.  Controlled.  No HCTZ at DC. 7. Hypothyroid: Continue Synthroid. 8. Generalized anxiety disorder: Fluoxetine temporarily held due to hyponatremia, consider resuming fluoxetine at discharge 9. Nausea, vomiting and diarrhea:?  Related to sepsis picture.  C. difficile tested and negative.  Resolved. 10. Left kidney mass: Noted on CT abdomen and pelvis with contrast.  Outpatient follow-up with renal MRI.   DVT prophylaxis: SCD Code Status: Full Family Communication: I attempted unsuccessfully to reach patient's father via phone and unable to leave voicemail message.  I discussed with patient's RN to discuss with family when they come by regarding PICC line placement. Disposition: Admitted to stepdown unit.  Patient clinically improved and stable and transferred to medical bed 01/17/2018.  DC home possibly 01/20/2018.   Consultants:  ID Cardiology  for TEE  Procedures:  LP by EDP on night of admission TEE  Antimicrobials:  IV Flagyl  x1 dose IV cefepime x1 dose IV vancomycin and ceftriaxone 12/27 >   Subjective: Patient seen after she returned from TEE.  Denies complaints.  No headache or neck pain.  Tolerating diet.  As per RN, no acute issues noted.  ROS: As above, otherwise negative.  Objective:  Vitals:   01/19/18 1055 01/19/18 1100 01/19/18 1110 01/19/18 1409  BP: 120/78 124/66 123/68 120/71  Pulse: 78 71 75 77  Resp: 14 13 17 17   Temp:   97.8 F (36.6 C) 98 F (36.7 C)  TempSrc:   Oral Oral  SpO2: 99% 99% 96% 98%  Weight:      Height:        Examination:  General exam: Pleasant young female, small built and moderately nourished lying comfortably supine in bed. Respiratory system: Clear to auscultation. Respiratory effort normal.  Stable Cardiovascular system: S1 & S2 heard, RRR. No JVD, murmurs, rubs, gallops or clicks. No pedal edema.  Stable Gastrointestinal system: Abdomen is nondistended, soft and nontender. No organomegaly or masses felt. Normal bowel sounds heard.  Stable Central nervous system: Alert and oriented. No focal neurological deficits.  Neck stiffness resolved.  Stable Extremities: Symmetric 5 x 5 power. Skin: Minimal superficial bruising of bilateral MCP joints with mild faint patchy redness of left dorsum of hand without any other acute findings Psychiatry: Judgement and insight appear impaired. Mood & affect appropriate.     Data Reviewed: I have personally reviewed following labs and imaging studies  CBC: Recent Labs  Lab 01/15/18 1639 01/16/18 0327 01/17/18 0533 01/19/18 0731  WBC 41.2* 31.4* 25.1* 21.2*  NEUTROABS 34.6*  --  22.1*  --   HGB 15.0 13.0 13.1 12.0  HCT 44.5 38.8 39.4 36.5  MCV 85.2 85.5 84.9 85.9  PLT 147* 138* 164 324   Basic Metabolic Panel: Recent Labs  Lab 01/15/18 1657 01/16/18 0327 01/16/18 0656 01/16/18 1649 01/17/18 0533 01/19/18 0731  NA  --  130* 130* 135 137 138  K  --  3.0* 3.8 3.9 3.8 3.4*  CL  --  96* 99 102 103 104  CO2  --   20* 20* 23 26 25   GLUCOSE  --  120* 114* 158* 179* 146*  BUN  --  13 12 12 12 18   CREATININE  --  0.40* 0.38* 0.32* 0.35* 0.42*  CALCIUM  --  6.9* 6.9* 7.3* 7.3* 7.5*  MG 2.3  --   --   --   --   --    Liver Function Tests: Recent Labs  Lab 01/15/18 1639 01/16/18 0327  AST 34 29  ALT 36 30  ALKPHOS 139* 126  BILITOT 0.9 0.7  PROT 7.3 5.7*  ALBUMIN 3.0* 2.2*     Recent Results (from the past 240 hour(s))  Blood Culture (routine x 2)     Status: Abnormal   Collection Time: 01/15/18  4:56 PM  Result Value Ref Range Status   Specimen Description   Final    BLOOD LEFT ANTECUBITAL Performed at [facility], [place] [address], [place], [place] [address]    Special Requests   Final    BOTTLES DRAWN AEROBIC AND ANAEROBIC Blood Culture adequate volume Performed at [place] [address] [place], [place] [address]    Culture  Setup Time   Final    GRAM POSITIVE COCCI IN BOTH AEROBIC AND  ANAEROBIC BOTTLES CRITICAL VALUE NOTED.  VALUE IS CONSISTENT WITH PREVIOUSLY REPORTED AND CALLED VALUE.    Culture (A)  Final    STREPTOCOCCUS PNEUMONIAE SUSCEPTIBILITIES PERFORMED ON PREVIOUS CULTURE WITHIN THE LAST 5 DAYS. Performed at [facility], [place] [address], [place], [place] [address]    Report Status 01/18/2018 FINAL  Final  Blood Culture (routine x 2)     Status: Abnormal   Collection Time: 01/15/18  5:01 PM  Result Value Ref Range Status   Specimen Description   Final    BLOOD LEFT ANTECUBITAL Performed at [place] [address] [place], [place] [address]    Special Requests   Final    BOTTLES DRAWN AEROBIC AND ANAEROBIC Blood Culture adequate volume Performed at [place] [address], [place], [place] [address]    Culture  Setup Time   Final    GRAM POSITIVE COCCI IN BOTH AEROBIC AND ANAEROBIC BOTTLES CRITICAL RESULT CALLED TO, READ BACK BY AND VERIFIED WITH: RN [place], [place] [id number] FCP Performed at [facility], [place] [address], [place], [place] [address]    Culture STREPTOCOCCUS PNEUMONIAE (A)  Final   Report Status 01/18/2018 FINAL  Final   Organism ID, Bacteria STREPTOCOCCUS PNEUMONIAE  Final      Susceptibility   Streptococcus pneumoniae - MIC*    ERYTHROMYCIN >=8 RESISTANT Resistant     LEVOFLOXACIN 0.5 SENSITIVE Sensitive     VANCOMYCIN 0.5 SENSITIVE Sensitive     PENICILLIN (meningitis) 1 RESISTANT Resistant     PENICILLIN (non-meningitis) 1 SENSITIVE Sensitive     CEFTRIAXONE (non-meningitis) 1 SENSITIVE Sensitive     CEFTRIAXONE (meningitis) 1 INTERMEDIATE Intermediate     * STREPTOCOCCUS PNEUMONIAE  Blood Culture ID Panel (Reflexed)     Status: Abnormal   Collection Time: 01/15/18  5:01 PM  Result Value Ref Range Status   Enterococcus species NOT DETECTED NOT DETECTED Final   Listeria monocytogenes NOT DETECTED NOT DETECTED Final   Staphylococcus species NOT DETECTED NOT DETECTED Final   Staphylococcus aureus (BCID) NOT DETECTED NOT DETECTED Final   Streptococcus species DETECTED (A) NOT DETECTED Final    Comment: CRITICAL RESULT CALLED TO, READ BACK BY AND VERIFIED WITH: RN HAKINGS, E 0727 [id number] FCP    Streptococcus agalactiae NOT DETECTED NOT DETECTED Final   Streptococcus pneumoniae DETECTED (A) NOT DETECTED Final    Comment: CRITICAL RESULT CALLED TO, READ BACK BY AND VERIFIED WITH: RN HAKINGS, E 0727 [id number] FCP    Streptococcus pyogenes NOT DETECTED NOT DETECTED Final   Acinetobacter baumannii NOT DETECTED NOT DETECTED Final   Enterobacteriaceae species NOT DETECTED NOT DETECTED Final   Enterobacter cloacae complex NOT DETECTED NOT DETECTED Final   Escherichia coli NOT DETECTED NOT DETECTED Final   Klebsiella oxytoca NOT DETECTED NOT DETECTED Final   Klebsiella pneumoniae NOT DETECTED NOT DETECTED Final   Proteus species NOT DETECTED NOT DETECTED Final   Serratia marcescens NOT DETECTED NOT DETECTED Final   Haemophilus  influenzae NOT DETECTED NOT DETECTED Final   Neisseria meningitidis NOT DETECTED NOT DETECTED Final   Pseudomonas aeruginosa NOT DETECTED NOT DETECTED Final   Candida albicans NOT DETECTED NOT DETECTED Final   Candida glabrata NOT DETECTED NOT DETECTED Final   Candida krusei NOT DETECTED NOT DETECTED Final   Candida parapsilosis NOT DETECTED NOT DETECTED Final   Candida tropicalis NOT DETECTED NOT DETECTED Final    Comment: Performed at [facility], [address] [place], [place] [address]  CSF culture     Status: Abnormal   Collection Time: 01/15/18 11:51 PM  Result Value Ref Range Status   Specimen Description   Final    CSF Performed at [place] [address] [place], [place] [address]    Special Requests   Final    NONE Performed at [facility], [place] [address], [place], [place] [address]    Gram Stain   Final    WBC PRESENT, PREDOMINANTLY PMN GRAM POSITIVE COCCI CRITICAL RESULT CALLED TO, READ BACK BY AND VERIFIED WITH: ANN HAWKINS AT 0345 ON 01/16/18 BY A,MOHAMED  CYTOSPIN SMEAR Performed at [place] [address], [place], [place] [address]    Culture STREPTOCOCCUS PNEUMONIAE (A)  Final   Report Status 01/18/2018 FINAL  Final   Organism ID, Bacteria STREPTOCOCCUS PNEUMONIAE  Final      Susceptibility   Streptococcus pneumoniae - MIC*    ERYTHROMYCIN >=8 RESISTANT Resistant     LEVOFLOXACIN 0.5 SENSITIVE Sensitive     VANCOMYCIN 0.5 SENSITIVE Sensitive     PENICILLIN (meningitis) 1 RESISTANT Resistant     PENICILLIN (non-meningitis) 1 SENSITIVE Sensitive     CEFTRIAXONE (non-meningitis) 1 SENSITIVE Sensitive     CEFTRIAXONE (meningitis) 1 INTERMEDIATE Intermediate     * STREPTOCOCCUS PNEUMONIAE  MRSA PCR Screening     Status: None   Collection Time: 01/16/18 12:53 AM  Result Value Ref Range Status   MRSA by PCR NEGATIVE NEGATIVE Final    Comment:        The GeneXpert MRSA Assay  (FDA approved for NASAL specimens only), is one component of a comprehensive MRSA colonization surveillance program. It is not intended to diagnose MRSA infection nor to guide or monitor treatment for MRSA infections. Performed at [facility], [place], [place] [address] [place], [place] [address]   C difficile quick scan w PCR reflex     Status: None   Collection Time: 01/16/18  4:10 PM  Result Value Ref Range Status   C Diff antigen NEGATIVE NEGATIVE Final   C Diff toxin NEGATIVE NEGATIVE Final   C Diff interpretation No C. difficile detected.  Final    Comment: Performed at [facility], [place] [address], [place], [place] [address]  Culture, blood (routine x 2)     Status: None (Preliminary result)   Collection Time: 01/17/18  4:51 PM  Result Value Ref Range Status   Specimen Description   Final    BLOOD RIGHT ARM Performed at [place] [address] [place], [place] [address]    Special Requests   Final    BOTTLES DRAWN AEROBIC AND ANAEROBIC Blood Culture adequate volume Performed at [place] [address], [place], [place] [address]    Culture   Final    NO GROWTH 1 DAY Performed at [facility], [place] [address] [place], [place] [address]    Report Status PENDING  Incomplete  Culture, blood (routine x 2)     Status: None (Preliminary result)   Collection Time: 01/17/18  4:56 PM  Result Value Ref Range Status   Specimen Description   Final    BLOOD RIGHT HAND Performed at [place] [address], [place],  [address]    Special Requests   Final    BOTTLES DRAWN AEROBIC ONLY Blood Culture adequate volume Performed at [place]  [address], [place], [place] [address]    Culture   Final    NO GROWTH 1 DAY Performed at [facility], [place] [address], [place], [place] [address]    Report Status PENDING  Incomplete          Radiology Studies: [place] Ekg Site Rite  Result Date: 01/19/2018 If Site Rite image not attached, placement could not be confirmed due to current cardiac rhythm.       Scheduled Meds: . chlorhexidine  15 mL Mouth Rinse BID  . dexamethasone  10 mg Intravenous Q6H  . famotidine  20 mg Oral BID  . fluticasone  2 spray Each Nare Daily  . levothyroxine  75 mcg Oral QAC breakfast  . lisinopril  10 mg Oral Daily   Continuous Infusions: . sodium chloride 10 mL/hr at 01/18/18 0200  . cefTRIAXone (ROCEPHIN)  IV 2 g (01/18/18 2127)  . vancomycin 750 mg (01/19/18 0355)     LOS: 4 days     Vernell Leep, MD, FACP, [facility]. [facility] Pager [phone]  If 7PM-7AM, please contact night-coverage [email or web address] Password [email or web address] 01/19/2018, 2:12 PM

## 2018-01-19 NOTE — Progress Notes (Signed)
Pharmacy Antibiotic Note  Emma Martin is a 37 y.o. female admitted on 01/15/2018 with r/o meningits.  Pharmacy has been consulted for vancomycin dosing.  Plan: Vancomycin increase to 1gm q8, based on Peak/trough levels today to deliver trough level of 15-20 mcg/ml. Calculated AUC of 618 Goal AUC >500 in meningitis Continued Rocephin 2 Gm IV q12h (MD) OPAT orders entered, note written, last dose abx to complete 01/29/2018  Height: 5\' 1"  (154.9 cm) Weight: 116 lb (52.6 kg) IBW/kg (Calculated) : 47.8  Temp (24hrs), Avg:97.8 F (36.6 C), Min:97.6 F (36.4 C), Max:98 F (36.7 C)  Recent Labs  Lab 01/15/18 1639 01/15/18 1727 01/16/18 0327 01/16/18 0656 01/16/18 1649 01/17/18 0533 01/19/18 0731 01/19/18 1323  WBC 41.2*  --  31.4*  --   --  25.1* 21.2*  --   CREATININE 0.57  --  0.40* 0.38* 0.32* 0.35* 0.42*  --   LATICACIDVEN  --  2.53*  --   --   --   --   --   --   VANCOTROUGH  --   --   --   --   --   --   --  8*  VANCOPEAK  --   --   --   --   --   --  17*  --     Estimated Creatinine Clearance: 73.4 mL/min (A) (by C-G formula based on SCr of 0.42 mg/dL (L)).    Allergies  Allergen Reactions  . Benzoin Rash    Antimicrobials this admission: 12/27 cefepime >> x1 12/28 vancomycin >>  12/28 rocephin >>  Dose adjustments this admission: 12/31 VP 17 at 0731, dose hung 0355 am, 12/31 VT 1323 = 7, increase to 1gm q8  Microbiology results: Microbiology results: 12/27 BCx: 4/4 bottles GPC      12/28 BCID: Streptococcus pneumoniae (Never called to pharmacy) CTX intermed (meningitis) sens (non-meningitis). R EES, sens LVQ, R PCN (mening) Sens PCN (non mening). Vanc sens.  12/28 MRSA PCR: negative 12/27 LP: GPC on gram stain; culture pending 12/28 Cdiff Ag: neg, toxin: neg 12/29 BCx2: ngtd  Thank you for allowing pharmacy to be a part of this patient's care.

## 2018-01-19 NOTE — Progress Notes (Signed)
Physical Therapy Treatment Patient Details Name: Emma Martin MRN: [id number] DOB: 1980/02/08 Today's Date: 01/19/2018    History of Present Illness 37 yo female admitted to ED on 12/27 with sepsis secondary to pneumococcal acute bacterial meningitis with bacteremia. Pt had been experiences N/V, diarrhea, weakness, and falls a week PTA. PMH includes Turner syndrome, bicuspid valve insufficiency, anxiety, HTN, scoliosis with Harrington rods 1997.     PT Comments    Pt with improved ambulation distance today, pt still limited by fatigue and periods of unsteadiness during ambulation. Pt with continued anxiety about mobility and moves slowly to avoid feeling imbalanced. PT updating PT followup recommendation to HHPT to address deficits in the home. PT to continue to follow acutely.    Follow Up Recommendations  Supervision for mobility/OOB;Home health PT     Equipment Recommendations  None recommended by PT   Recommendations for Other Services       Precautions / Restrictions Precautions Precautions: Fall Restrictions Weight Bearing Restrictions: No    Mobility  Bed Mobility Overal bed mobility: Needs Assistance Bed Mobility: Supine to Sit;Rolling;Sidelying to Sit Rolling: Supervision Sidelying to sit: Min assist   Sit to supine: Min assist   General bed mobility comments: Supervision for rolling, min assist for sidelying to sit for trunk elevation. Pt with increased time and effort to perform. Pt able to bridge with mod I for placement of bed pan.   Transfers Overall transfer level: Needs assistance Equipment used: Rolling walker (2 wheeled) Transfers: Sit to/from Stand Sit to Stand: Supervision         General transfer comment: Supervision for safety. Verbal cuing for hand placement.   Ambulation/Gait Ambulation/Gait assistance: Min guard Gait Distance (Feet): 150 Feet Assistive device: Rolling walker (2 wheeled) Gait Pattern/deviations: Step-through  pattern;Decreased stride length Gait velocity: decr   General Gait Details: Min guard for safety. Verbal cuing for placement in RW, 2 periods of pt-corrected unsteadiness during ambulation. Pt with fatigue after ambulation.    Stairs             Wheelchair Mobility    Modified Rankin (Stroke Patients Only)       Balance Overall balance assessment: Needs assistance Sitting-balance support: No upper extremity supported;Feet unsupported Sitting balance-Leahy Scale: Good     Standing balance support: Bilateral upper extremity supported Standing balance-Leahy Scale: Poor Standing balance comment: Pt anxious and relies on RW for steadying                             Cognition Arousal/Alertness: Awake/alert Behavior During Therapy: Anxious Overall Cognitive Status: Within Functional Limits for tasks assessed                                        Exercises      General Comments General comments (skin integrity, edema, etc.): bilateral MMT: hip flexion 3/5, hip abduction 3/5, hip adduction 3/5, knee extension 3+/5.       Pertinent Vitals/Pain Pain Assessment: No/denies pain    Home Living                      Prior Function            PT Goals (current goals can now be found in the care plan section) Acute Rehab PT Goals Patient Stated Goal: return to PLOF PT Goal  Formulation: With patient Time For Goal Achievement: 02/01/18 Potential to Achieve Goals: Good Progress towards PT goals: Progressing toward goals    Frequency    Min 3X/week      PT Plan Discharge plan needs to be updated    Co-evaluation              AM-PAC PT "6 Clicks" Mobility   Outcome Measure  Help needed turning from your back to your side while in a flat bed without using bedrails?: A Little Help needed moving from lying on your back to sitting on the side of a flat bed without using bedrails?: A Little Help needed moving to and from a  bed to a chair (including a wheelchair)?: A Little Help needed standing up from a chair using your arms (e.g., wheelchair or bedside chair)?: A Little Help needed to walk in hospital room?: A Little Help needed climbing 3-5 steps with a railing? : A Little 6 Click Score: 18    End of Session Equipment Utilized During Treatment: Gait belt Activity Tolerance: Patient tolerated treatment well;Patient limited by fatigue Patient left: with call bell/phone within reach;with family/visitor present;in bed;with bed alarm set;with SCD's reapplied Nurse Communication: Mobility status PT Visit Diagnosis: Other abnormalities of gait and mobility (R26.89);Difficulty in walking, not elsewhere classified (R26.2)     Time: 1840-1900 PT Time Calculation (min) (ACUTE ONLY): 20 min  Charges:  $Gait Training: 8-22 mins                     Julien Girt, PT Acute Rehabilitation Services Pager [phone]  Office [phone]    Jewelia Bocchino D Elonda Husky 01/19/2018, 8:06 PM

## 2018-01-19 NOTE — CV Procedure (Signed)
   Transesophageal Echocardiogram  Indications: Bacteremia, Turner's syndrome, bicuspid AV  Time out performed  During this procedure the patient is administered a total of Versed and Fentanyl (see nursing note) to achieve and maintain moderate conscious sedation.  The patient's heart rate, blood pressure, and oxygen saturation are monitored continuously during the procedure. The period of conscious sedation is 30 minutes, of which I was present face-to-face 100% of this time.  Findings:  Left Ventricle: Normal EF 55%  Mitral Valve: Mild MR  Aortic Valve: Bicuspid, mild AI (stable)  Tricuspid Valve: normal  Left Atrium: Normal  Aorta: No evidence of coarctation.   Impression: NO VEGETATIONS.   Candee Furbish, MD

## 2018-01-20 ENCOUNTER — Encounter (HOSPITAL_COMMUNITY): Payer: Self-pay | Admitting: Cardiology

## 2018-01-20 DIAGNOSIS — G002 Streptococcal meningitis: Secondary | ICD-10-CM | POA: Diagnosis not present

## 2018-01-20 DIAGNOSIS — B955 Unspecified streptococcus as the cause of diseases classified elsewhere: Secondary | ICD-10-CM

## 2018-01-20 DIAGNOSIS — R7881 Bacteremia: Secondary | ICD-10-CM | POA: Diagnosis not present

## 2018-01-20 DIAGNOSIS — G001 Pneumococcal meningitis: Secondary | ICD-10-CM | POA: Diagnosis not present

## 2018-01-20 DIAGNOSIS — G009 Bacterial meningitis, unspecified: Secondary | ICD-10-CM | POA: Diagnosis not present

## 2018-01-20 LAB — CBC
HCT: 37.9 % (ref 36.0–46.0)
Hemoglobin: 12.2 g/dL (ref 12.0–15.0)
MCH: 28.1 pg (ref 26.0–34.0)
MCHC: 32.2 g/dL (ref 30.0–36.0)
MCV: 87.3 fL (ref 80.0–100.0)
Platelets: 279 10*3/uL (ref 150–400)
RBC: 4.34 MIL/uL (ref 3.87–5.11)
RDW: 13.2 % (ref 11.5–15.5)
WBC: 26.6 10*3/uL — ABNORMAL HIGH (ref 4.0–10.5)
nRBC: 0 % (ref 0.0–0.2)

## 2018-01-20 LAB — BASIC METABOLIC PANEL
Anion gap: 12 (ref 5–15)
BUN: 16 mg/dL (ref 6–20)
CO2: 22 mmol/L (ref 22–32)
CREATININE: 0.39 mg/dL — AB (ref 0.44–1.00)
Calcium: 7.5 mg/dL — ABNORMAL LOW (ref 8.9–10.3)
Chloride: 104 mmol/L (ref 98–111)
GFR calc Af Amer: >60 mL/min (ref >60)
GFR calc non Af Amer: >60 mL/min (ref >60)
Glucose, Bld: 125 mg/dL — ABNORMAL HIGH (ref 70–99)
Potassium: 3.7 mmol/L (ref 3.5–5.1)
Sodium: 138 mmol/L (ref 135–145)

## 2018-01-20 LAB — MAGNESIUM: Magnesium: 1.9 mg/dL (ref 1.7–2.4)

## 2018-01-20 MED ORDER — LISINOPRIL 10 MG PO TABS: 10.0000 mg | ORAL_TABLET | Freq: Every day | ORAL | 0 refills | Status: DC

## 2018-01-20 MED ORDER — SODIUM CHLORIDE 0.9% FLUSH: 10.0000 mL | Freq: Two times a day (BID) | INTRAVENOUS | Status: DC

## 2018-01-20 MED ORDER — SODIUM CHLORIDE 0.9% FLUSH: 10.0000 mL | INTRAVENOUS | Status: DC | PRN

## 2018-01-20 NOTE — Progress Notes (Signed)
[facility]  [facility] is prepared for pt DC today, 01/20/18 after pt receives her 4 PM Vancomycin dose.  Teaching with patient's father last PM. He did very well and will be independent to administer PM IV ABX at home. [facility] will see pt for admission visit on 01/21/18 between 8-9 AM to support further teaching. IV ABX are prepared and ready for delivery to home tonight by 8 PM. Father and pt aware of plan.  If patient discharges after hours, please call [phone].   Larry Sierras 01/20/2018, 8:02 AM

## 2018-01-20 NOTE — Discharge Summary (Signed)
Physician Discharge Summary  Emma Martin:[id number] DOB: 1980/09/09  PCP: Maurice Small, MD  Admit date: 01/15/2018 Discharge date: 01/20/2018  Recommendations for Outpatient Follow-up:  1. Dr. Maurice Small, PCP in 1 week with repeat labs (CBC & BMP) which will be drawn by home health services as per OPAT protocol. 2. Please follow final surveillance blood cultures that were sent from the hospital on 01/17/2018. 3. PICC line to be removed after completion of home IV antibiotic course. 4. Left kidney mass noted on imaging to be evaluated as outpatient with a renal MRI.  Please see imaging report below.  Home Health: PT and RN Equipment/Devices: None  Discharge Condition: Improved and stable CODE STATUS: Full Diet recommendation: Heart healthy diet.  Discharge Diagnoses:  Principal Problem:   Sepsis ([place]) Active Problems:   HTN (hypertension)   Hypothyroidism   Generalized anxiety disorder   Hyponatremia   Hypokalemia   Streptococcal bacteremia   Pneumococcal meningitis   Nuchal rigidity   Turner syndrome   Brief Summary: 38 year old female patient, lives with her father, independent, PMH of Turner syndrome, HTN, hypothyroid, bicuspid aortic valve, GAD, presented to [facility] ED on 01/15/2018 with 1 week history of headache and neck pain followed several days later by nausea, vomiting, dizziness, generalized weakness and malaise, mild diarrhea and RUQ pain, dyspnea, denied fever but had chills.  Hypothermic/temperature 96.6 rectally in ED, marked neutrophilic GLOVFIEPPIRJ/18.8, positive for neck stiffness.  Admitted to stepdown unit for pneumococcal acute bacterial meningitis with bacteremia.  Infectious disease consulting.  C. difficile negative.  TTE technically difficult study but negative for vegetations.  Transferred to medical floor.  Status post TEE 12/31, no vegetations.  PICC line ordered.   Assessment & Plan:   1. Sepsis due to invasive pneumococcal  disease with meningitis with bacteremia: Met sepsis criteria on admission (hypothermia, tachycardia, leukocytosis, neck stiffness).  Urine microscopy, chest x-ray, CT abdomen and pelvis with contrast and RUQ ultrasound without acute findings.  MRSA PCR negative. Underwent LP in the ED. CT head without acute findings.  She received a dose of cefepime and metronidazole prior to CSF collection.  Currently on meningitic doses of IV ceftriaxone and vancomycin, continue.  Completed IV Decadron 10 mg every 6 hourly x4 days.  TTE technically difficult study but no vegetations.  Droplet isolation discontinued.  CSF and blood culture confirms strep pneumonia but intermediate sensitivity to ceftriaxone for meningitis.  ID following. Surveillance blood cultures x2: Negative to date.  TEE 12/31 without vegetations.  As per ID recommendations, plan 14 days of vancomycin + ceftriaxone due to sensitivity pattern of strep pneumonia, end date 01/29/2018.  Clinically improved.  Neck stiffness has resolved.  As discussed with ID today, okay for discharge home and no need for outpatient ID follow-up. 2. Hypokalemia:  Replaced.  Magnesium 2.3. 3. Hyponatremia: May be due to dehydration due to poor oral intake, GI losses, sepsis and due to HCTZ.  Presented with sodium of 125 which which gradually improved over >36 hours to 137.  DC IV fluids.  Tolerating diet.  Resolved.  Discontinued HCTZ indefinitely. 4. Leukocytosis: Secondary to sepsis.  WBC was 41.    Leukocytosis had improved to 20 1K yesterday but has gone up to 26.6 today in the absence of any worsening clinical infection.  No change in management.  Follow CBCs as outpatient. 5. Thrombocytopenia: Likely related to sepsis.  No bleeding reported.  Resolved. 6. Essential hypertension:  Continue lisinopril 10 mg daily.  HCTZ discontinued due to  significant hyponatremia on admission.  Reasonable inpatient control. 7. Hypothyroid: Continue Synthroid. 8. Generalized anxiety  disorder: Fluoxetine temporarily held due to hyponatremia and resumed at discharge. 9. Nausea, vomiting and diarrhea:?  Related to sepsis picture.  C. difficile tested and negative.  Resolved. 10. Left kidney mass: Noted on CT abdomen and pelvis with contrast.  Outpatient follow-up with renal MRI. 11. Turner syndrome: Bicuspid aortic valve noted by TEE.   Consultants:  ID Cardiology for TEE  Procedures:  LP by EDP on night of admission TEE 01/19/2018:  Study Conclusions  - Status, risk factors: bicuspid AOV. HTN. Turner&'s Syndrome. - Left ventricle: The cavity size was normal. Wall thickness was   normal. Systolic function was normal. The estimated ejection   fraction was in the range of 55% to 60%. - Aortic valve: Bicuspid; mildly thickened leaflets. There was mild   regurgitation. - Aorta: No coarctation detected. - Mitral valve: No evidence of vegetation. There was mild   regurgitation. - Left atrium: No evidence of thrombus in the atrial cavity or   appendage. No evidence of thrombus in the appendage. - Right atrium: No evidence of thrombus in the atrial cavity or   appendage. - Atrial septum: No defect or patent foramen ovale was identified. - Tricuspid valve: No evidence of vegetation. - Pulmonic valve: No evidence of vegetation. - Superior vena cava: The study excluded a thrombus.  Impressions:  - No evidence of endocarditis. There was no evidence of a   vegetation.  Right upper arm PICC line 01/20/2018  Discharge Instructions  Discharge Instructions    Call MD for:  difficulty breathing, headache or visual disturbances   Complete by:  As directed    Call MD for:  extreme fatigue   Complete by:  As directed    Call MD for:  persistant dizziness or light-headedness   Complete by:  As directed    Call MD for:  persistant nausea and vomiting   Complete by:  As directed    Call MD for:  severe uncontrolled pain   Complete by:  As directed    Call MD for:   temperature >100.4   Complete by:  As directed    Diet - low sodium heart healthy   Complete by:  As directed    Home infusion instructions [place] May follow [place] Dosing Protocol; May administer Cathflo as needed to maintain patency of vascular access device.; [place] of vascular access device: per [facility] Protocol: 0.9% NaCl pre/post medica...   Complete by:  As directed    Instructions:  May follow [facility] Dosing Protocol   Instructions:  May administer Cathflo as needed to maintain patency of vascular access device.   Instructions:  Flushing of vascular access device: per [facility] Protocol: 0.9% NaCl pre/post medication administration and prn patency; Heparin 100 u/ml, 67m for implanted ports and Heparin 10u/ml, 567mfor all other central venous catheters.   Instructions:  May follow [facility] Anaphylaxis Protocol for First Dose Administration in the home: 0.9% NaCl at 25-50 ml/hr to maintain IV access for protocol meds. Epinephrine 0.3 ml IV/IM PRN and Benadryl 25-50 IV/IM PRN s/s of anaphylaxis.   Instructions:  Ad[facility]nernfusion Coordinator (RN) to assist per patient IV care needs in the home PRN.   Increase activity slowly   Complete by:  As directed        Medication List    STOP taking these medications   lisinopril-hydrochlorothiazide 10-12.5 MG tablet Commonly known as:  PRINZIDE,ZESTORETIC     TAKE these medications   cefTRIAXone  IVPB Commonly known as:  ROCEPHIN Inject 2 g into the vein every 12 (twelve) hours for 10 days. Indication:  meningitis Last Day of Therapy:  01/29/2018 Labs - Once weekly:  CBC/D and BMP, Labs - Every other week:  ESR and CRP   CENTRUM PO Take 1 tablet by mouth daily.   FLUoxetine 40 MG capsule Commonly known as:  PROZAC Take 40 mg by mouth daily.   fluticasone 50 MCG/ACT nasal spray Commonly known as:  FLONASE Place 2 sprays into both nostrils daily.   levothyroxine 75 MCG tablet Commonly known as:  SYNTHROID,  LEVOTHROID Take 75 mcg by mouth daily before breakfast.   lisinopril 10 MG tablet Commonly known as:  PRINIVIL,ZESTRIL Take 1 tablet (10 mg total) by mouth daily. Start taking on:  January 21, 2018   meclizine 25 MG tablet Commonly known as:  ANTIVERT Take 25 mg by mouth 3 (three) times daily as needed for dizziness.   norethindrone-ethinyl estradiol-iron 1.5-30 MG-MCG tablet Commonly known as:  MICROGESTIN FE,GILDESS FE,LOESTRIN FE Take 1 tablet by mouth daily.   vancomycin  IVPB Inject 1,000 mg into the vein every 8 (eight) hours for 10 days. Indication:  meningitis Last Day of Therapy:  01/29/2018 Labs - Sunday/Monday:  CBC/D, BMP, and vancomycin trough. Labs - Thursday:  BMP and vancomycin trough Labs - Every other week:  ESR and CRP      Follow-up Information    [facility]-Home Follow up.   Specialty:  [place] Why:  [facility] nursing-iv abx,supplies;HH physical therapy Contact information: [place] [address] [phone]        Maurice Small, MD. Schedule an appointment as soon as possible for a visit in 1 week(s).   Specialty:  Family Medicine Why:  Follow-up with repeat labs (CBC & BMP) that will be drawn by home health services as per OPAT protocol. Contact information: [address] [place]  [address] [phone]          Allergies  Allergen Reactions  . Benzoin Rash      Procedures/Studies: Ct Head Wo Contrast  Result Date: 01/15/2018 CLINICAL DATA:  Initial evaluation for acute headache, falls. EXAM: CT HEAD WITHOUT CONTRAST CT CERVICAL SPINE WITHOUT CONTRAST TECHNIQUE: Multidetector CT imaging of the head and cervical spine was performed following the standard protocol without intravenous contrast. Multiplanar CT image reconstructions of the cervical spine were also generated. COMPARISON:  None. FINDINGS: CT HEAD FINDINGS Brain: Cerebral volume within normal limits for patient age.  No evidence for acute intracranial hemorrhage. No findings to suggest acute large vessel territory infarct. No mass lesion, midline shift, or mass effect. Ventricles are normal in size without evidence for hydrocephalus. No extra-axial fluid collection identified. Vascular: No hyperdense vessel identified. Skull: Scalp soft tissues demonstrate no acute abnormality. Calvarium intact. Sinuses/Orbits: Globes and orbital soft tissues within normal limits. Moderate mucosal thickening and opacity within the ethmoidal air cells and sphenoid sinuses. Paranasal sinuses are otherwise clear. No mastoid effusion. CT CERVICAL SPINE FINDINGS Alignment: Straightening of the normal cervical lordosis. No listhesis or malalignment. Skull base and vertebrae: Skull base intact. Normal C1-2 articulations are preserved in the dens is intact. Vertebral body heights maintained. No acute fracture. Soft tissues and spinal canal: Soft tissues of the neck demonstrate no acute finding. No abnormal prevertebral edema. Spinal canal within normal limits. Disc levels: Mild degenerative spondylolysis present at C4-5  and C5-6. No significant spinal stenosis within the cervical spine. Thoracic fusion hardware partially visualized. Upper chest: Visualized upper chest limited evaluation due to streak artifact from thoracic hardware. No obvious abnormality identified. Other: None. IMPRESSION: CT BRAIN: 1. No acute intracranial abnormality. 2. Moderate sphenoid ethmoidal sinus disease. CT CERVICAL SPINE: No CT evidence for acute traumatic injury within the cervical spine. Electronically Signed   By: Jeannine Boga M.D.   On: 01/15/2018 23:10   Ct Cervical Spine Wo Contrast  Result Date: 01/15/2018 CLINICAL DATA:  Initial evaluation for acute headache, falls. EXAM: CT HEAD WITHOUT CONTRAST CT CERVICAL SPINE WITHOUT CONTRAST TECHNIQUE: Multidetector CT imaging of the head and cervical spine was performed following the standard protocol without  intravenous contrast. Multiplanar CT image reconstructions of the cervical spine were also generated. COMPARISON:  None. FINDINGS: CT HEAD FINDINGS Brain: Cerebral volume within normal limits for patient age. No evidence for acute intracranial hemorrhage. No findings to suggest acute large vessel territory infarct. No mass lesion, midline shift, or mass effect. Ventricles are normal in size without evidence for hydrocephalus. No extra-axial fluid collection identified. Vascular: No hyperdense vessel identified. Skull: Scalp soft tissues demonstrate no acute abnormality. Calvarium intact. Sinuses/Orbits: Globes and orbital soft tissues within normal limits. Moderate mucosal thickening and opacity within the ethmoidal air cells and sphenoid sinuses. Paranasal sinuses are otherwise clear. No mastoid effusion. CT CERVICAL SPINE FINDINGS Alignment: Straightening of the normal cervical lordosis. No listhesis or malalignment. Skull base and vertebrae: Skull base intact. Normal C1-2 articulations are preserved in the dens is intact. Vertebral body heights maintained. No acute fracture. Soft tissues and spinal canal: Soft tissues of the neck demonstrate no acute finding. No abnormal prevertebral edema. Spinal canal within normal limits. Disc levels: Mild degenerative spondylolysis present at C4-5 and C5-6. No significant spinal stenosis within the cervical spine. Thoracic fusion hardware partially visualized. Upper chest: Visualized upper chest limited evaluation due to streak artifact from thoracic hardware. No obvious abnormality identified. Other: None. IMPRESSION: CT BRAIN: 1. No acute intracranial abnormality. 2. Moderate sphenoid ethmoidal sinus disease. CT CERVICAL SPINE: No CT evidence for acute traumatic injury within the cervical spine. Electronically Signed   By: Jeannine Boga M.D.   On: 01/15/2018 23:10   Ct Abdomen Pelvis W Contrast  Result Date: 01/15/2018 CLINICAL DATA:  Status post fall few days  ago with acute generalized abdomen pain EXAM: CT ABDOMEN AND PELVIS WITH CONTRAST TECHNIQUE: Multidetector CT imaging of the abdomen and pelvis was performed using the standard protocol following bolus administration of intravenous contrast. CONTRAST:  158m ISOVUE-300 IOPAMIDOL (ISOVUE-300) INJECTION 61%, 312mOMNIPAQUE IOHEXOL 300 MG/ML SOLN COMPARISON:  None. FINDINGS: Lower chest: Minimal bilateral pleural effusions are noted. Hepatobiliary: Patchy low density is identified near the falciform ligament probably due to focal fatty infiltration of liver. The liver is otherwise normal. The gallbladder is normal. The biliary tree is normal. Pancreas: Unremarkable. No pancreatic ductal dilatation or surrounding inflammatory changes. Spleen: Normal in size without focal abnormality. Adrenals/Urinary Tract: The bilateral adrenal glands are normal. The right kidney is normal. In the lower pole left kidney, there is a heterogeneous enhancing mass measuring 2.1 x 2.7 cm. There is no hydronephrosis bilaterally. The bladder is normal. Stomach/Bowel: Stomach is within normal limits. The appendix is not definitely seen but no inflammation is noted around cecum. No evidence of bowel wall thickening, distention, or inflammatory changes. Vascular/Lymphatic: No significant vascular findings are present. No enlarged abdominal or pelvic lymph nodes. Reproductive: Uterus and bilateral adnexa are  unremarkable. Other: None. Musculoskeletal: Patient status post prior fixation of thoracic and upper lumbar spine. IMPRESSION: No acute abnormality identified in the abdomen pelvis. Heterogeneous enhancing mass in the lower pole left kidney measuring 2.1 x 2.7 cm. Recommend further evaluation with renal MRI on outpatient basis as neoplasm of the kidney is not excluded. Electronically Signed   By: Abelardo Diesel M.D.   On: 01/15/2018 22:37   Dg Chest Port 1 View  Result Date: 01/15/2018 CLINICAL DATA:  Pt reports generalized weakness,  dizziness with n/v and h/a Sunday night. Went to a clinic yesterday for her h/a and was given 2 injections with some relief. Her father reports slurred speech, LSN was 2 days ago. EXAM: PORTABLE CHEST 1 VIEW COMPARISON:  08/27/2016 FINDINGS: Shallow lung inflation. Heart size is normal. No focal consolidations or pleural effusions. No pulmonary edema. Thoracolumbar spinal fixation rods. IMPRESSION: No evidence for acute cardiopulmonary abnormality. Electronically Signed   By: Nolon Nations M.D.   On: 01/15/2018 18:03   [place] Ekg Site Rite  Result Date: 01/19/2018 If Site Rite image not attached, placement could not be confirmed due to current cardiac rhythm.  US Abdomen Limited Ruq  Result Date: 01/15/2018 CLINICAL DATA:  Right upper quadrant pain x5 days. EXAM: ULTRASOUND ABDOMEN LIMITED RIGHT UPPER QUADRANT COMPARISON:  None. FINDINGS: Gallbladder: No gallstones or wall thickening visualized. No sonographic Murphy sign noted by sonographer. Common bile duct: Diameter: 2.7 mm Liver: No focal lesion identified. Within normal limits in parenchymal echogenicity. Portal vein is patent on color Doppler imaging with normal direction of blood flow towards the liver. Other: Relative to the liver, the right adjacent kidney is slightly echogenic but maintains its cortical-medullary distinction. The possibility of mild medical renal disease is not entirely excluded. IMPRESSION: Unremarkable right upper quadrant abdominal ultrasound. Slightly echogenic appearance of the right kidney relative to the liver, a nonspecific finding but can be seen in medical renal disease. Electronically Signed   By: Ashley Royalty M.D.   On: 01/15/2018 18:53      Subjective: Patient states that she feels "fine".  Denies headache or neck pain.  No other complaints reported.  Tolerated diet.  As per RN, no acute issues noted.  Right upper extremity PICC line placed this morning.  Discharge Exam:  Vitals:   01/19/18 1409 01/19/18  2104 01/20/18 0452 01/20/18 1336  BP: 120/71 (!) 143/93 (!) 147/92 127/88  Pulse: 77 80 67 75  Resp: _0 Temp: 98 F (36.7 C) 97.6 F (36.4 C) 97.7 F (36.5 C) 97.6 F (36.4 C)  TempSrc: Oral Oral Oral Oral  SpO2: 98% 100% 97% 98%  Weight:      Height:        General exam: Pleasant young female, small built and moderately nourished lying comfortably supine in bed. Respiratory system: Clear to auscultation. Respiratory effort normal.   Cardiovascular system: S1 & S2 heard, RRR. No JVD, murmurs, rubs, gallops or clicks. No pedal edema.  Gastrointestinal system: Abdomen is nondistended, soft and nontender. No organomegaly or masses felt. Normal bowel sounds heard.   Central nervous system: Alert and oriented. No focal neurological deficits.  Neck stiffness resolved.   Extremities: Symmetric 5 x 5 power. Skin: Minimal superficial bruising of bilateral MCP joints has resolved Psychiatry: Judgement and insight appear impaired. Mood & affect appropriate.     The results of significant diagnostics from this hospitalization (including imaging, microbiology, ancillary and laboratory) are listed below for reference.  Microbiology: Recent Results (from the past 240 hour(s))  Blood Culture (routine x 2)     Status: Abnormal   Collection Time: 01/15/18  4:56 PM  Result Value Ref Range Status   Specimen Description   Final    BLOOD LEFT ANTECUBITAL Performed at [facility] [address] [place], [place] [address]    Special Requests   Final    BOTTLES DRAWN AEROBIC AND ANAEROBIC Blood Culture adequate volume Performed at [facility] [address], [place], [place] [address]    Culture  Setup Time   Final    GRAM POSITIVE COCCI IN BOTH AEROBIC AND ANAEROBIC BOTTLES CRITICAL VALUE NOTED.  VALUE IS CONSISTENT WITH PREVIOUSLY REPORTED AND CALLED VALUE.    Culture (A)  Final    STREPTOCOCCUS PNEUMONIAE SUSCEPTIBILITIES PERFORMED ON  PREVIOUS CULTURE WITHIN THE LAST 5 DAYS. Performed at [facility], [place] [address], [place], [place] [address]    Report Status 01/18/2018 FINAL  Final  Blood Culture (routine x 2)     Status: Abnormal   Collection Time: 01/15/18  5:01 PM  Result Value Ref Range Status   Specimen Description   Final    BLOOD LEFT ANTECUBITAL Performed at [facility] [address], [place], [place] [address]    Special Requests   Final    BOTTLES DRAWN AEROBIC AND ANAEROBIC Blood Culture adequate volume Performed at [facility] [address] [place], [place] [address]    Culture  Setup Time   Final    GRAM POSITIVE COCCI IN BOTH AEROBIC AND ANAEROBIC BOTTLES CRITICAL RESULT CALLED TO, READ BACK BY AND VERIFIED WITH: RN [name], [place] [phone] FCP Performed at [facility], [place] [address] [place], [place] [address]    Culture STREPTOCOCCUS PNEUMONIAE (A)  Final   Report Status 01/18/2018 FINAL  Final   Organism ID, Bacteria STREPTOCOCCUS PNEUMONIAE  Final      Susceptibility   Streptococcus pneumoniae - MIC*    ERYTHROMYCIN >=8 RESISTANT Resistant     LEVOFLOXACIN 0.5 SENSITIVE Sensitive     VANCOMYCIN 0.5 SENSITIVE Sensitive     PENICILLIN (meningitis) 1 RESISTANT Resistant     PENICILLIN (non-meningitis) 1 SENSITIVE Sensitive     CEFTRIAXONE (non-meningitis) 1 SENSITIVE Sensitive     CEFTRIAXONE (meningitis) 1 INTERMEDIATE Intermediate     * STREPTOCOCCUS PNEUMONIAE  Blood Culture ID Panel (Reflexed)     Status: Abnormal   Collection Time: 01/15/18  5:01 PM  Result Value Ref Range Status   Enterococcus species NOT DETECTED NOT DETECTED Final   Listeria monocytogenes NOT DETECTED NOT DETECTED Final   Staphylococcus species NOT DETECTED NOT DETECTED Final   Staphylococcus aureus (BCID) NOT DETECTED NOT DETECTED Final   Streptococcus species DETECTED (A) NOT DETECTED Final    Comment: CRITICAL RESULT CALLED TO, READ BACK BY AND VERIFIED  WITH: RN HAKINGS, E 0727 [phone] FCP    Streptococcus agalactiae NOT DETECTED NOT DETECTED Final   Streptococcus pneumoniae DETECTED (A) NOT DETECTED Final    Comment: CRITICAL RESULT CALLED TO, READ BACK BY AND VERIFIED WITH: RN HAKINGS, E 0727 [id number] FCP    Streptococcus pyogenes NOT DETECTED NOT DETECTED Final   Acinetobacter baumannii NOT DETECTED NOT DETECTED Final   Enterobacteriaceae species NOT DETECTED NOT DETECTED Final   Enterobacter cloacae complex NOT DETECTED NOT DETECTED Final   Escherichia coli NOT DETECTED NOT DETECTED Final   Klebsiella oxytoca NOT DETECTED NOT DETECTED  Final   Klebsiella pneumoniae NOT DETECTED NOT DETECTED Final   Proteus species NOT DETECTED NOT DETECTED Final   Serratia marcescens NOT DETECTED NOT DETECTED Final   Haemophilus influenzae NOT DETECTED NOT DETECTED Final   Neisseria meningitidis NOT DETECTED NOT DETECTED Final   Pseudomonas aeruginosa NOT DETECTED NOT DETECTED Final   Candida albicans NOT DETECTED NOT DETECTED Final   Candida glabrata NOT DETECTED NOT DETECTED Final   Candida krusei NOT DETECTED NOT DETECTED Final   Candida parapsilosis NOT DETECTED NOT DETECTED Final   Candida tropicalis NOT DETECTED NOT DETECTED Final    Comment: Performed at [facility], [place] [address], [place], [place] [address]  CSF culture     Status: Abnormal   Collection Time: 01/15/18 11:51 PM  Result Value Ref Range Status   Specimen Description   Final    CSF Performed at [facility] [address], [place], [place] [address]    Special Requests   Final    NONE Performed at [facility], [place] [address], [place], [place] [address]    Gram Stain   Final    WBC PRESENT, PREDOMINANTLY PMN GRAM POSITIVE COCCI CRITICAL RESULT CALLED TO, READ BACK BY AND VERIFIED WITH: ANN HAWKINS AT 0345 ON 01/16/18 BY A,MOHAMED  CYTOSPIN SMEAR Performed at [facility] [address], [place],  [place] [address]    Culture STREPTOCOCCUS PNEUMONIAE (A)  Final   Report Status 01/18/2018 FINAL  Final   Organism ID, Bacteria STREPTOCOCCUS PNEUMONIAE  Final      Susceptibility   Streptococcus pneumoniae - MIC*    ERYTHROMYCIN >=8 RESISTANT Resistant     LEVOFLOXACIN 0.5 SENSITIVE Sensitive     VANCOMYCIN 0.5 SENSITIVE Sensitive     PENICILLIN (meningitis) 1 RESISTANT Resistant     PENICILLIN (non-meningitis) 1 SENSITIVE Sensitive     CEFTRIAXONE (non-meningitis) 1 SENSITIVE Sensitive     CEFTRIAXONE (meningitis) 1 INTERMEDIATE Intermediate     * STREPTOCOCCUS PNEUMONIAE  MRSA PCR Screening     Status: None   Collection Time: 01/16/18 12:53 AM  Result Value Ref Range Status   MRSA by PCR NEGATIVE NEGATIVE Final    Comment:        The GeneXpert MRSA Assay (FDA approved for NASAL specimens only), is one component of a comprehensive MRSA colonization surveillance program. It is not intended to diagnose MRSA infection nor to guide or monitor treatment for MRSA infections. Performed at [facility], [place] [address], [place], [place] [address]   C difficile quick scan w PCR reflex     Status: None   Collection Time: 01/16/18  4:10 PM  Result Value Ref Range Status   C Diff antigen NEGATIVE NEGATIVE Final   C Diff toxin NEGATIVE NEGATIVE Final   C Diff interpretation No C. difficile detected.  Final    Comment: Performed at [facility], [place] [address], [place], [place] [address]  Culture, blood (routine x 2)     Status: None (Preliminary result)   Collection Time: 01/17/18  4:51 PM  Result Value Ref Range Status   Specimen Description   Final    BLOOD RIGHT ARM Performed at [place] [address], [place], [place] [address]    Special Requests   Final    BOTTLES DRAWN AEROBIC AND ANAEROBIC Blood Culture adequate volume Performed at [place] [address] [place],  [address]  Culture    Final    NO GROWTH 1 DAY Performed at [facility], [place] [address] [place], [place] [address]    Report Status PENDING  Incomplete  Culture, blood (routine x 2)     Status: None (Preliminary result)   Collection Time: 01/17/18  4:56 PM  Result Value Ref Range Status   Specimen Description   Final    BLOOD RIGHT HAND Performed at [place] [address] [place], [place] [address]    Special Requests   Final    BOTTLES DRAWN AEROBIC ONLY Blood Culture adequate volume Performed at [place] [address], [place], [place] [address]    Culture   Final    NO GROWTH 1 DAY Performed at [facility], [place] [address], [place], [place] [address]    Report Status PENDING  Incomplete     Labs: CBC: Recent Labs  Lab 01/15/18 1639 01/16/18 0327 01/17/18 0533 01/19/18 0731 01/20/18 0507  WBC 41.2* 31.4* 25.1* 21.2* 26.6*  NEUTROABS 34.6*  --  22.1*  --   --   HGB 15.0 13.0 13.1 12.0 12.2  HCT 44.5 38.8 39.4 36.5 37.9  MCV 85.2 85.5 84.9 85.9 87.3  PLT 147* 138* 164 233 749   Basic Metabolic Panel: Recent Labs  Lab 01/15/18 1657  01/16/18 0656 01/16/18 1649 01/17/18 0533 01/19/18 0731 01/20/18 0507  NA  --    < > 130* 135 137 138 138  K  --    < > 3.8 3.9 3.8 3.4* 3.7  CL  --    < > 99 102 103 104 104  CO2  --    < > 20* _0 GLUCOSE  --    < > 114* 158* 179* 146* 125*  BUN  --    < > _1 CREATININE  --    < > 0.38* 0.32* 0.35* 0.42* 0.39*  CALCIUM  --    < > 6.9* 7.3* 7.3* 7.5* 7.5*  MG 2.3  --   --   --   --   --  1.9   < > = values in this interval not displayed.   Liver Function Tests: Recent Labs  Lab 01/15/18 1639 01/16/18 0327  AST 34 29  ALT 36 30  ALKPHOS 139* 126  BILITOT 0.9 0.7  PROT 7.3 5.7*  ALBUMIN 3.0* 2.2*   Urinalysis    Component Value Date/Time   COLORURINE YELLOW 01/15/2018 1905   APPEARANCEUR HAZY (A) 01/15/2018 1905   LABSPEC 1.020 01/15/2018 1905    LABSPEC 1.010 11/24/2006 1033   PHURINE 6.0 01/15/2018 1905   GLUCOSEU NEGATIVE 01/15/2018 1905   HGBUR SMALL (A) 01/15/2018 1905   BILIRUBINUR NEGATIVE 01/15/2018 1905   BILIRUBINUR Negative 11/24/2006 1033   KETONESUR 5 (A) 01/15/2018 1905   PROTEINUR 100 (A) 01/15/2018 1905   NITRITE NEGATIVE 01/15/2018 1905   LEUKOCYTESUR NEGATIVE 01/15/2018 1905   LEUKOCYTESUR Negative 11/24/2006 1033    I discussed in detail with patient's father, updated all care and answered questions.  Time coordinating discharge: 50 minutes  SIGNED:  Vernell Leep, MD, FACP, [facility]. [facility] Pager [phone]  If 7PM-7AM, please contact night-coverage [email or web address] Password [email or web address] 01/20/2018, 2:22 PM

## 2018-01-20 NOTE — Discharge Instructions (Signed)

## 2018-01-20 NOTE — Progress Notes (Signed)
Peripherally Inserted Central Catheter/Midline Placement  The IV Nurse has discussed with the patient and/or persons authorized to consent for the patient, the purpose of this procedure and the potential benefits and risks involved with this procedure.  The benefits include less needle sticks, lab draws from the catheter, and the patient may be discharged home with the catheter. Risks include, but not limited to, infection, bleeding, blood clot (thrombus formation), and puncture of an artery; nerve damage and irregular heartbeat and possibility to perform a PICC exchange if needed/ordered by physician.  Alternatives to this procedure were also discussed.  Bard Power PICC patient education guide, fact sheet on infection prevention and patient information card has been provided to patient /or left at bedside.    PICC/Midline Placement Documentation  PICC Single Lumen 01/20/18 PICC Right Cephalic 32 cm 0 cm (Active)  Indication for Insertion or Continuance of Line Home intravenous therapies (PICC only) 01/20/2018  9:00 AM  Exposed Catheter (cm) 0 cm 01/20/2018  9:00 AM  Site Assessment Clean;Dry;Intact 01/20/2018  9:00 AM  Line Status Flushed;Saline locked;Blood return noted 01/20/2018  9:00 AM  Dressing Type Transparent 01/20/2018  9:00 AM  Dressing Status Clean;Dry;Intact;Antimicrobial disc in place 01/20/2018  9:00 AM  Line Care Connections checked and tightened 01/20/2018  9:00 AM  Line Adjustment (NICU/IV Team Only) No 01/20/2018  9:00 AM  Dressing Intervention New dressing 01/20/2018  9:00 AM  Dressing Change Due 01/27/18 01/20/2018  9:00 AM       Emma Martin 01/20/2018, 9:00 AM

## 2018-01-20 NOTE — Progress Notes (Signed)
Physical Therapy Treatment Patient Details Name: Emma Martin MRN: [id number] DOB: 12/26/1980 Today's Date: 01/20/2018    History of Present Illness 38 yo female admitted to ED on 12/27 with sepsis secondary to pneumococcal acute bacterial meningitis with bacteremia. Pt had been experiences N/V, diarrhea, weakness, and falls a week PTA. PMH includes Turner syndrome, bicuspid valve insufficiency, anxiety, HTN, scoliosis with Harrington rods 1997.     PT Comments    Pt with improved ambulation distance today and successful stair navigation. Pt is very anxious about mobility and feels unsteady without UE support. Pt does not have RW, but pt's father says they will either borrow one from their church or purchase one this evening. Pt given LE exercises to address LE weakness, to perform at home to her tolerance. Pt to d/c today.    Follow Up Recommendations  Supervision for mobility/OOB;Home health PT     Equipment Recommendations  None recommended by PT    Recommendations for Other Services       Precautions / Restrictions Precautions Precautions: Fall Restrictions Weight Bearing Restrictions: No    Mobility  Bed Mobility Overal bed mobility: Needs Assistance Bed Mobility: Rolling;Sidelying to Sit;Sit to Supine Rolling: Supervision Sidelying to sit: Supervision   Sit to supine: Min assist   General bed mobility comments: Supervision for rolling, min guard for sidelying to sit. Pt with very increased effort to come to sitting, PT encouraged pt to use L forearm and R hand to push up to sitting. Min assist for lifting legs back into bed with sit to supine, and PT and pt's father assisted pt in scooting up in bed with use of bed pad.   Transfers Overall transfer level: Needs assistance Equipment used: Rolling walker (2 wheeled);Quad cane Transfers: Sit to/from Stand Sit to Stand: [employer] transfer comment: Pt performed sit to stand with quad cane to  determine if pt needed to continue to use RW. Pt with min guard for safety. Pt reaching for environment once standing and walked unsteadily with cane, so Pt and PT agreed to switch back to RW.   Ambulation/Gait Ambulation/Gait assistance: Min guard Gait Distance (Feet): 200 Feet Assistive device: Rolling walker (2 wheeled) Gait Pattern/deviations: Step-through pattern;Decreased stride length Gait velocity: decr   General Gait Details: Min guard to supervision for safety. Pt with slow and steady gait this session.    Stairs Stairs: Yes Stairs assistance: Min guard Stair Management: One rail Right;Step to pattern;Sideways;Forwards Number of Stairs: 4 General stair comments: Min guard for safety. PT instructed pt in both forward progression and lateral stepping with facing rail. Pt more comfortable with sideways walking, as pt cannot reach both rails at home while climbing stairs and needs bilateral UE support to feel steady with stair navigation. Pt with difficulty with hip flexion to ascend steps. Pt with increased time to perform.   Wheelchair Mobility    Modified Rankin (Stroke Patients Only)       Balance Overall balance assessment: Needs assistance Sitting-balance support: No upper extremity supported;Feet unsupported Sitting balance-Leahy Scale: Good     Standing balance support: Bilateral upper extremity supported Standing balance-Leahy Scale: Poor Standing balance comment: Pt anxious and relies on RW for steadying                             Cognition Arousal/Alertness: Awake/alert Behavior During Therapy: Anxious Overall Cognitive Status: Within Functional Limits for  tasks assessed                                        Exercises General Exercises - Lower Extremity Long Arc Quad: AROM;Both;5 reps;Seated Hip Flexion/Marching: AROM;Both;5 reps;Seated    General Comments        Pertinent Vitals/Pain Pain Assessment: Faces Faces  Pain Scale: Hurts a little bit Pain Location: head, after mobility  Pain Descriptors / Indicators: Headache Pain Intervention(s): Patient requesting pain meds-RN notified;Monitored during session;Limited activity within patient's tolerance    Home Living                      Prior Function            PT Goals (current goals can now be found in the care plan section) Acute Rehab PT Goals Patient Stated Goal: return to PLOF PT Goal Formulation: With patient Time For Goal Achievement: 02/01/18 Potential to Achieve Goals: Good Progress towards PT goals: Progressing toward goals    Frequency    Min 3X/week      PT Plan Current plan remains appropriate    Co-evaluation              AM-PAC PT "6 Clicks" Mobility   Outcome Measure  Help needed turning from your back to your side while in a flat bed without using bedrails?: A Little Help needed moving from lying on your back to sitting on the side of a flat bed without using bedrails?: A Little Help needed moving to and from a bed to a chair (including a wheelchair)?: A Little Help needed standing up from a chair using your arms (e.g., wheelchair or bedside chair)?: A Little Help needed to walk in hospital room?: A Little Help needed climbing 3-5 steps with a railing? : A Little 6 Click Score: 18    End of Session Equipment Utilized During Treatment: Gait belt Activity Tolerance: Patient tolerated treatment well;Patient limited by fatigue;Other (comment)(Pt limited by headache) Patient left: with call bell/phone within reach;with family/visitor present;in bed;with bed alarm set;with SCD's reapplied Nurse Communication: Mobility status PT Visit Diagnosis: Other abnormalities of gait and mobility (R26.89);Difficulty in walking, not elsewhere classified (R26.2)     Time: 1241-1316 PT Time Calculation (min) (ACUTE ONLY): 35 min  Charges:  $Gait Training: 8-22 mins $Therapeutic Activity: 8-22 mins                      Julien Girt, PT Acute Rehabilitation Services Pager [phone]  Office [phone]    Emma Martin 01/20/2018, 2:33 PM

## 2018-01-20 NOTE — Progress Notes (Signed)
Nurse reviewed discharge instructions with pt and her father. Pt and father verbalized understanding of discharge instructions, follow up appointment and new medications.  Prescription electronically sent to pt's pharmacy.  Pt home with PICC line for antibiotic therapy.

## 2018-01-20 NOTE — Care Management Note (Signed)
Case Management Note  Patient Details  Name: Emma Martin MRN: [id number]  Date of Birth: [birth year]  Subjective/Objective:  [facility] rep Santiago Glad aware of all HHRN-iv abx,teaching,HHPT orders. No further CM needs.        Action/Plan:dc home w/HHC/iv abx   Expected Discharge Date:                  Expected Discharge Plan:  [name]  In-House Referral:     Discharge planning Services  CM Consult  Post Acute Care Choice:    Choice offered to:  Patient  DME Arranged:    DME Agency:     HH Arranged:  RN, PT, IV Antibiotics HH Agency:  [facility]  Status of Service:  Completed, signed off  If discussed at [place] of Stay Meetings, dates discussed:    Additional Comments:  Dessa Phi, RN 01/20/2018, 1:53 PM

## 2018-01-21 DIAGNOSIS — G002 Streptococcal meningitis: Secondary | ICD-10-CM | POA: Diagnosis not present

## 2018-01-23 DIAGNOSIS — E785 Hyperlipidemia, unspecified: Secondary | ICD-10-CM | POA: Diagnosis not present

## 2018-01-23 DIAGNOSIS — Z5181 Encounter for therapeutic drug level monitoring: Secondary | ICD-10-CM | POA: Diagnosis not present

## 2018-01-23 DIAGNOSIS — M4302 Spondylolysis, cervical region: Secondary | ICD-10-CM | POA: Diagnosis not present

## 2018-01-23 DIAGNOSIS — G002 Streptococcal meningitis: Secondary | ICD-10-CM | POA: Diagnosis not present

## 2018-01-23 DIAGNOSIS — R2689 Other abnormalities of gait and mobility: Secondary | ICD-10-CM | POA: Diagnosis not present

## 2018-01-23 DIAGNOSIS — F411 Generalized anxiety disorder: Secondary | ICD-10-CM | POA: Diagnosis not present

## 2018-01-23 DIAGNOSIS — R7881 Bacteremia: Secondary | ICD-10-CM | POA: Diagnosis not present

## 2018-01-23 DIAGNOSIS — I10 Essential (primary) hypertension: Secondary | ICD-10-CM | POA: Diagnosis not present

## 2018-01-23 DIAGNOSIS — I351 Nonrheumatic aortic (valve) insufficiency: Secondary | ICD-10-CM | POA: Diagnosis not present

## 2018-01-23 DIAGNOSIS — Z452 Encounter for adjustment and management of vascular access device: Secondary | ICD-10-CM | POA: Diagnosis not present

## 2018-01-23 DIAGNOSIS — Q969 Turner's syndrome, unspecified: Secondary | ICD-10-CM | POA: Diagnosis not present

## 2018-01-23 DIAGNOSIS — Z792 Long term (current) use of antibiotics: Secondary | ICD-10-CM | POA: Diagnosis not present

## 2018-01-23 DIAGNOSIS — B955 Unspecified streptococcus as the cause of diseases classified elsewhere: Secondary | ICD-10-CM | POA: Diagnosis not present

## 2018-01-23 LAB — CULTURE, BLOOD (ROUTINE X 2)
CULTURE: NO GROWTH
Culture: NO GROWTH
Special Requests: ADEQUATE
Special Requests: ADEQUATE

## 2018-01-25 DIAGNOSIS — R7881 Bacteremia: Secondary | ICD-10-CM | POA: Diagnosis not present

## 2018-01-25 DIAGNOSIS — G001 Pneumococcal meningitis: Secondary | ICD-10-CM | POA: Diagnosis not present

## 2018-01-25 DIAGNOSIS — G002 Streptococcal meningitis: Secondary | ICD-10-CM | POA: Diagnosis not present

## 2018-01-25 DIAGNOSIS — G009 Bacterial meningitis, unspecified: Secondary | ICD-10-CM | POA: Diagnosis not present

## 2018-01-26 ENCOUNTER — Emergency Department (HOSPITAL_COMMUNITY): Payer: Medicaid Other

## 2018-01-26 ENCOUNTER — Emergency Department (HOSPITAL_COMMUNITY)
Admission: EM | Admit: 2018-01-26 | Discharge: 2018-01-26 | Disposition: A | Discharge status: Home / Self Care | Payer: Medicaid Other | Attending: Emergency Medicine | Admitting: Emergency Medicine

## 2018-01-26 ENCOUNTER — Encounter (HOSPITAL_COMMUNITY): Payer: Self-pay | Admitting: Emergency Medicine

## 2018-01-26 DIAGNOSIS — E039 Hypothyroidism, unspecified: Secondary | ICD-10-CM | POA: Diagnosis not present

## 2018-01-26 DIAGNOSIS — Z79899 Other long term (current) drug therapy: Secondary | ICD-10-CM | POA: Insufficient documentation

## 2018-01-26 DIAGNOSIS — R51 Headache: Secondary | ICD-10-CM | POA: Insufficient documentation

## 2018-01-26 DIAGNOSIS — H9193 Unspecified hearing loss, bilateral: Secondary | ICD-10-CM | POA: Diagnosis present

## 2018-01-26 DIAGNOSIS — I10 Essential (primary) hypertension: Secondary | ICD-10-CM | POA: Diagnosis not present

## 2018-01-26 MED ORDER — IBUPROFEN 400 MG PO TABS
400.0000 mg | ORAL_TABLET | Freq: Once | ORAL | Status: AC
  Administered 2018-01-26: 400 mg via ORAL
  Filled 2018-01-26: qty 1

## 2018-01-26 NOTE — ED Notes (Signed)
Pt was transferring from wheelchair to triage chair and leaned to the right and fell from a standing position landed on her buttocks and hitting her back and head on the cabinet behind her. MD made aware and assessed patient. Ibuprofen ordered. No further orders or instructions at this time.

## 2018-01-26 NOTE — Discharge Instructions (Addendum)
CALL THE ENT CLINIC FOR AN APPOINTMENT TOMORROW. CALL THE INFECTIOUS DISEASES CLINIC FOR A FOLLOW UP APPOINTMENT.

## 2018-01-26 NOTE — ED Triage Notes (Signed)
Pt was dx with bacterial meningitis 1 week ago and has been treated with abx via left arm PICC and has been having hearing problems but woke up today unable to hear.

## 2018-01-26 NOTE — ED Provider Notes (Signed)
[facility] EMERGENCY DEPARTMENT Provider Note   CSN: [id number] Arrival date & time: 01/26/18  1748     History   Chief Complaint Chief Complaint  Patient presents with  . Hearing Loss    HPI Emma Martin is a 38 y.o. female.  38yo F w/ PMH including Turner syndrome, bicuspid aortic valve, recent streptococcal meningitis on IV antibiotics who p/w hearing loss. Hx obtained primarily from father as patient cannot hear and is having to write down communication. Father states that she was having some problems with her hearing when she presented to the hospital on 12/27 and was ultimately diagnosed with meningitis, admitted until 1/1.  At time of discharge, her hearing had improved though not completely back to normal.  Over the past 48 hours, her hearing has significantly worsened and she cannot hear anything currently.  She reports pressure feeling in her ears but denies any significant ear pain.  Father states that she was having headaches since discharge from the hospital but her headaches have improved since switching to ibuprofen a few days ago.  She has had no fevers or vomiting since being at home.  She has been receiving all medications as prescribed.  LEVEL 5 CAVEAT DUE TO HEARING LOSS  The history is provided by the patient and a relative. The history is limited by a language barrier.    Past Medical History:  Diagnosis Date  . Bicuspid aortic valve    , mild aortic insufficiency. (No SBE prophylaxis needed)  . Complication of anesthesia   . Dyslipidemia   . Generalized anxiety disorder    , with obsessive-compulsive traits.  . Hypertension   . Hypothyroidism   . Mixed gonadal dysgenesis    , (additional Y-bearing cell line) for which she reportedly underwent bilateral gonadectomy (patient unaware; needs confirmation) due to malignancy potential.  . PONV (postoperative nausea and vomiting)   . Primary amenorrhea    , treated starting at age 38 or  8 with estrogen and progesterone  . Scoliosis    , treated with Harrington rods (1997) for stabilization.  . Seasonal allergic rhinitis   . Short stature    , previously treated with growth hormone age 58-15 years.  Radford Pax syndrome    , previously followed by pediatric endocrinologist Freddy Jaksch, MD at [facility] through her early 60s).    Patient Active Problem List   Diagnosis Date Noted  . Streptococcal bacteremia   . Pneumococcal meningitis   . Nuchal rigidity   . Turner syndrome   . Abdominal pain 01/15/2018  . Hypothyroidism 01/15/2018  . Generalized anxiety disorder 01/15/2018  . Hyponatremia 01/15/2018  . Hypokalemia 01/15/2018  . Sepsis ([place]) 01/15/2018  . Bicuspid aortic valve 04/06/2013  . HTN (hypertension) 04/06/2013    Past Surgical History:  Procedure Laterality Date  . TEE WITHOUT CARDIOVERSION N/A 01/19/2018   Procedure: TRANSESOPHAGEAL ECHOCARDIOGRAM (TEE);  Surgeon: Jerline Pain, MD;  Location: [facility] ENDOSCOPY;  Service: Cardiovascular;  Laterality: N/A;     OB History   No obstetric history on file.      Home Medications    Prior to Admission medications   Medication Sig Start Date End Date Taking? Authorizing Provider  cefTRIAXone (ROCEPHIN) IVPB Inject 2 g into the vein every 12 (twelve) hours for 10 days. Indication:  meningitis Last Day of Therapy:  01/29/2018 Labs - Once weekly:  CBC/D and BMP, Labs - Every other week:  ESR and CRP 01/20/18 01/30/18  Hongalgi,  Lenis Dickinson, MD  FLUoxetine (PROZAC) 40 MG capsule Take 40 mg by mouth daily.    [provider]  fluticasone (FLONASE) 50 MCG/ACT nasal spray Place 2 sprays into both nostrils daily.    [provider]  levothyroxine (SYNTHROID, LEVOTHROID) 75 MCG tablet Take 75 mcg by mouth daily before breakfast.    [provider]  lisinopril (PRINIVIL,ZESTRIL) 10 MG tablet Take 1 tablet (10 mg total) by mouth daily. 01/21/18   Hongalgi, Lenis Dickinson, MD  meclizine (ANTIVERT)  25 MG tablet Take 25 mg by mouth 3 (three) times daily as needed for dizziness.    [provider]  Multiple Vitamins-Minerals (CENTRUM PO) Take 1 tablet by mouth daily.    [provider]  norethindrone-ethinyl estradiol-iron (MICROGESTIN FE,GILDESS FE,LOESTRIN FE) 1.5-30 MG-MCG tablet Take 1 tablet by mouth daily.    [provider]  vancomycin IVPB Inject 1,000 mg into the vein every 8 (eight) hours for 10 days. Indication:  meningitis Last Day of Therapy:  01/29/2018 Labs - Sunday/Monday:  CBC/D, BMP, and vancomycin trough. Labs - Thursday:  BMP and vancomycin trough Labs - Every other week:  ESR and CRP 01/20/18 01/30/18  Hongalgi, Lenis Dickinson, MD    Family History Family History  Problem Relation Age of Onset  . Other Father        A + W  . Cancer Maternal Grandmother   . Cancer Paternal Grandfather   . Hypertension Mother   . Other Mother        Dyslipidemia  . Hypothyroidism Mother   . Mitral valve prolapse Mother     Social History Social History   Tobacco Use  . Smoking status: Never Smoker  . Smokeless tobacco: Never Used  Substance Use Topics  . Alcohol use: Yes    Alcohol/week: 1.0 - 2.0 standard drinks    Types: 1 - 2 Glasses of wine per week    Comment: Occasionally  . Drug use: No     Allergies   Benzoin   Review of Systems Review of Systems  Unable to perform ROS: Other  hearing loss   Physical Exam Updated Vital Signs BP 140/85   Pulse (!) 105   Temp 98 F (36.7 C) (Oral)   Resp 20   SpO2 96%   Physical Exam Vitals signs and nursing note reviewed.  Constitutional:      General: She is not in acute distress.    Appearance: She is well-developed.  HENT:     Head: Normocephalic and atraumatic.     Right Ear: Tympanic membrane and ear canal normal.     Left Ear: Tympanic membrane normal.     Ears:     Comments: Cerumen L ear canal but able to visualize TM Eyes:     Extraocular Movements: Extraocular movements  intact.     Conjunctiva/sclera: Conjunctivae normal.     Pupils: Pupils are equal, round, and reactive to light.  Neck:     Musculoskeletal: Neck supple.  Cardiovascular:     Rate and Rhythm: Normal rate and regular rhythm.     Heart sounds: Normal heart sounds. No murmur.  Pulmonary:     Effort: Pulmonary effort is normal.     Breath sounds: Normal breath sounds.  Abdominal:     General: Bowel sounds are normal. There is no distension.     Palpations: Abdomen is soft.     Tenderness: There is no abdominal tenderness.  Skin:    General: Skin is  warm and dry.     Comments: PICC R upper arm with mild surrounding ecchymosis, no erythema or drainage  Neurological:     Mental Status: She is alert and oriented to person, place, and time.     Sensory: No sensory deficit.     Motor: No weakness.     Comments: Fluent speech; bilateral hearing loss but normal reception of written language; 5/5 strength x all 4 ext  Psychiatric:        Judgment: Judgment normal.      ED Treatments / Results  Labs (all labs ordered are listed, but only abnormal results are displayed) Labs Reviewed - No data to display  EKG None  Radiology Ct Head Wo Contrast  Result Date: 01/26/2018 CLINICAL DATA:  Sudden onset hearing loss. Currently on IV advised X for bacterial meningitis. EXAM: CT HEAD WITHOUT CONTRAST TECHNIQUE: Contiguous axial images were obtained from the base of the skull through the vertex without intravenous contrast. COMPARISON:  CT head dated January 15, 2018. FINDINGS: Brain: No evidence of acute infarction, hemorrhage, hydrocephalus, extra-axial collection or mass lesion/mass effect. Vascular: No hyperdense vessel or unexpected calcification. Skull: Normal. Negative for fracture or focal lesion. Sinuses/Orbits: No acute findings. Unchanged paranasal sinus disease with complete opacification of the left sphenoid sinus and a right posterior ethmoid air cell. Other: None. IMPRESSION: 1.  No  acute intracranial abnormality. Electronically Signed   By: Titus Dubin M.D.   On: 01/26/2018 21:59    Procedures Procedures (including critical care time)  Medications Ordered in ED Medications  ibuprofen (ADVIL,MOTRIN) tablet 400 mg (400 mg Oral Given 01/26/18 1859)     Initial Impression / Assessment and Plan / ED Course  I have reviewed the triage vital signs and the nursing notes.  Pertinent labs & imaging results that were available during my care of the patient were reviewed by me and considered in my medical decision making (see chart for details).    She was comfortable on exam, afebrile.  Her neurologic exam was notable only for bilateral hearing loss, had to communicate by writing.  Head CT is unremarkable.  Of note, she had a fall when transferring from wheelchair to triage chair and reportedly bumped the back of her head, no LOC.  Head CT was obtained after the fall and was reassuring against acute injury.  Atraumatic on exam.  I discussed patient's case with Dr. Leonel Ramsay, neurology, who felt that symptoms were unlikely to be due to increased intracranial pressure.  Discussed with infectious disease, Dr. Megan Salon, who felt that hearing loss was unlikely to be related to vancomycin.  He recommended continuing current medication regimen and contacting infectious disease clinic for follow-up.  Also discussed case with ENT, Dr. Janace Hoard.  He stated that hearing loss is seen frequently with cases of bacterial meningitis.  He recommended contacting the clinic tomorrow for audiology evaluation and discussion of treatment options.  I discussed this plan with the patient and her father.  They voiced understanding and feel comfortable with this outpatient follow-up plan.  I have extensively reviewed return precautions and father voiced understanding. Final Clinical Impressions(s) / ED Diagnoses   Final diagnoses:  Bilateral hearing loss, unspecified hearing loss type    ED Discharge  Orders    None       Daphane Odekirk, Wenda [place], MD 01/26/18 2258

## 2018-01-27 ENCOUNTER — Telehealth: Payer: Self-pay | Admitting: *Deleted

## 2018-01-27 NOTE — Telephone Encounter (Signed)
-----   Message from Michel Bickers, MD sent at 01/27/2018  7:27 AM EST ----- Please call patient's father or family to tell her about [facility] appointment with jeff on 1/17. He saw her for meningitis and she has developed hearing loss.  Jenny Reichmann

## 2018-01-27 NOTE — Telephone Encounter (Signed)
RN left message on father's phone number with appointment time, date and our address and phone number. Landis Gandy, RN

## 2018-01-28 DIAGNOSIS — G002 Streptococcal meningitis: Secondary | ICD-10-CM | POA: Diagnosis not present

## 2018-01-30 ENCOUNTER — Emergency Department (HOSPITAL_COMMUNITY): Payer: Medicaid Other

## 2018-01-30 ENCOUNTER — Encounter (HOSPITAL_COMMUNITY): Payer: Self-pay | Admitting: Emergency Medicine

## 2018-01-30 ENCOUNTER — Other Ambulatory Visit: Payer: Self-pay

## 2018-01-30 ENCOUNTER — Inpatient Hospital Stay (HOSPITAL_COMMUNITY)
Admission: EM | Admit: 2018-01-30 | Discharge: 2018-02-03 | DRG: 065 | Disposition: A | Discharge status: Rehabilitation Facility | Payer: Medicaid Other | Attending: Internal Medicine | Admitting: Internal Medicine

## 2018-01-30 DIAGNOSIS — B953 Streptococcus pneumoniae as the cause of diseases classified elsewhere: Secondary | ICD-10-CM | POA: Diagnosis present

## 2018-01-30 DIAGNOSIS — Q969 Turner's syndrome, unspecified: Secondary | ICD-10-CM | POA: Diagnosis not present

## 2018-01-30 DIAGNOSIS — I6340 Cerebral infarction due to embolism of unspecified cerebral artery: Principal | ICD-10-CM | POA: Diagnosis present

## 2018-01-30 DIAGNOSIS — G001 Pneumococcal meningitis: Secondary | ICD-10-CM

## 2018-01-30 DIAGNOSIS — R29704 NIHSS score 4: Secondary | ICD-10-CM | POA: Diagnosis present

## 2018-01-30 DIAGNOSIS — H903 Sensorineural hearing loss, bilateral: Secondary | ICD-10-CM | POA: Diagnosis present

## 2018-01-30 DIAGNOSIS — R7881 Bacteremia: Secondary | ICD-10-CM | POA: Diagnosis present

## 2018-01-30 DIAGNOSIS — Q231 Congenital insufficiency of aortic valve: Secondary | ICD-10-CM | POA: Diagnosis not present

## 2018-01-30 DIAGNOSIS — M419 Scoliosis, unspecified: Secondary | ICD-10-CM | POA: Diagnosis present

## 2018-01-30 DIAGNOSIS — I351 Nonrheumatic aortic (valve) insufficiency: Secondary | ICD-10-CM | POA: Diagnosis not present

## 2018-01-30 DIAGNOSIS — G8194 Hemiplegia, unspecified affecting left nondominant side: Secondary | ICD-10-CM | POA: Diagnosis not present

## 2018-01-30 DIAGNOSIS — I38 Endocarditis, valve unspecified: Secondary | ICD-10-CM | POA: Diagnosis present

## 2018-01-30 DIAGNOSIS — Z888 Allergy status to other drugs, medicaments and biological substances status: Secondary | ICD-10-CM | POA: Diagnosis not present

## 2018-01-30 DIAGNOSIS — I10 Essential (primary) hypertension: Secondary | ICD-10-CM

## 2018-01-30 DIAGNOSIS — E039 Hypothyroidism, unspecified: Secondary | ICD-10-CM | POA: Diagnosis present

## 2018-01-30 DIAGNOSIS — F411 Generalized anxiety disorder: Secondary | ICD-10-CM | POA: Diagnosis present

## 2018-01-30 DIAGNOSIS — R402142 Coma scale, eyes open, spontaneous, at arrival to emergency department: Secondary | ICD-10-CM | POA: Diagnosis present

## 2018-01-30 DIAGNOSIS — Z79890 Hormone replacement therapy (postmenopausal): Secondary | ICD-10-CM

## 2018-01-30 DIAGNOSIS — Z79899 Other long term (current) drug therapy: Secondary | ICD-10-CM

## 2018-01-30 DIAGNOSIS — F329 Major depressive disorder, single episode, unspecified: Secondary | ICD-10-CM | POA: Diagnosis present

## 2018-01-30 DIAGNOSIS — G039 Meningitis, unspecified: Secondary | ICD-10-CM

## 2018-01-30 DIAGNOSIS — R0682 Tachypnea, not elsewhere classified: Secondary | ICD-10-CM | POA: Diagnosis not present

## 2018-01-30 DIAGNOSIS — E785 Hyperlipidemia, unspecified: Secondary | ICD-10-CM | POA: Diagnosis present

## 2018-01-30 DIAGNOSIS — R269 Unspecified abnormalities of gait and mobility: Secondary | ICD-10-CM | POA: Diagnosis not present

## 2018-01-30 DIAGNOSIS — G002 Streptococcal meningitis: Secondary | ICD-10-CM | POA: Diagnosis not present

## 2018-01-30 DIAGNOSIS — Z8661 Personal history of infections of the central nervous system: Secondary | ICD-10-CM | POA: Diagnosis not present

## 2018-01-30 DIAGNOSIS — E871 Hypo-osmolality and hyponatremia: Secondary | ICD-10-CM | POA: Diagnosis present

## 2018-01-30 DIAGNOSIS — R402362 Coma scale, best motor response, obeys commands, at arrival to emergency department: Secondary | ICD-10-CM | POA: Diagnosis present

## 2018-01-30 DIAGNOSIS — I340 Nonrheumatic mitral (valve) insufficiency: Secondary | ICD-10-CM | POA: Diagnosis not present

## 2018-01-30 DIAGNOSIS — I639 Cerebral infarction, unspecified: Secondary | ICD-10-CM

## 2018-01-30 DIAGNOSIS — I69398 Other sequelae of cerebral infarction: Secondary | ICD-10-CM | POA: Diagnosis not present

## 2018-01-30 DIAGNOSIS — Z8249 Family history of ischemic heart disease and other diseases of the circulatory system: Secondary | ICD-10-CM | POA: Diagnosis not present

## 2018-01-30 DIAGNOSIS — J302 Other seasonal allergic rhinitis: Secondary | ICD-10-CM | POA: Diagnosis present

## 2018-01-30 DIAGNOSIS — R5383 Other fatigue: Secondary | ICD-10-CM | POA: Diagnosis not present

## 2018-01-30 DIAGNOSIS — G9341 Metabolic encephalopathy: Secondary | ICD-10-CM | POA: Diagnosis not present

## 2018-01-30 DIAGNOSIS — R402252 Coma scale, best verbal response, oriented, at arrival to emergency department: Secondary | ICD-10-CM | POA: Diagnosis present

## 2018-01-30 DIAGNOSIS — I69354 Hemiplegia and hemiparesis following cerebral infarction affecting left non-dominant side: Secondary | ICD-10-CM | POA: Diagnosis not present

## 2018-01-30 DIAGNOSIS — G0490 Encephalitis and encephalomyelitis, unspecified: Secondary | ICD-10-CM | POA: Diagnosis not present

## 2018-01-30 LAB — CBG MONITORING, ED: Glucose-Capillary: 76 mg/dL (ref 70–99)

## 2018-01-30 LAB — I-STAT CHEM 8, ED
BUN: 9 mg/dL (ref 6–20)
CHLORIDE: 98 mmol/L (ref 98–111)
Calcium, Ion: 1.09 mmol/L — ABNORMAL LOW (ref 1.15–1.40)
Creatinine, Ser: 0.2 mg/dL — ABNORMAL LOW (ref 0.44–1.00)
Glucose, Bld: 89 mg/dL (ref 70–99)
HCT: 39 % (ref 36.0–46.0)
Hemoglobin: 13.3 g/dL (ref 12.0–15.0)
Potassium: 3.6 mmol/L (ref 3.5–5.1)
Sodium: 136 mmol/L (ref 135–145)
TCO2: 26 mmol/L (ref 22–32)

## 2018-01-30 LAB — CBC
HCT: 38.8 % (ref 36.0–46.0)
Hemoglobin: 12.4 g/dL (ref 12.0–15.0)
MCH: 28.1 pg (ref 26.0–34.0)
MCHC: 32 g/dL (ref 30.0–36.0)
MCV: 88 fL (ref 80.0–100.0)
Platelets: 327 10*3/uL (ref 150–400)
RBC: 4.41 MIL/uL (ref 3.87–5.11)
RDW: 14.2 % (ref 11.5–15.5)
WBC: 10.4 10*3/uL (ref 4.0–10.5)
nRBC: 0 % (ref 0.0–0.2)

## 2018-01-30 LAB — COMPREHENSIVE METABOLIC PANEL
ALBUMIN: 2.9 g/dL — AB (ref 3.5–5.0)
ALT: 386 U/L — AB (ref 0–44)
AST: 184 U/L — AB (ref 15–41)
Alkaline Phosphatase: 216 U/L — ABNORMAL HIGH (ref 38–126)
Anion gap: 10 (ref 5–15)
BUN: 7 mg/dL (ref 6–20)
CO2: 26 mmol/L (ref 22–32)
Calcium: 8.8 mg/dL — ABNORMAL LOW (ref 8.9–10.3)
Chloride: 100 mmol/L (ref 98–111)
Creatinine, Ser: 0.43 mg/dL — ABNORMAL LOW (ref 0.44–1.00)
GFR calc Af Amer: >60 mL/min (ref >60)
GFR calc non Af Amer: >60 mL/min (ref >60)
GLUCOSE: 97 mg/dL (ref 70–99)
Potassium: 3.5 mmol/L (ref 3.5–5.1)
Sodium: 136 mmol/L (ref 135–145)
Total Bilirubin: 0.9 mg/dL (ref 0.3–1.2)
Total Protein: 6.8 g/dL (ref 6.5–8.1)

## 2018-01-30 LAB — DIFFERENTIAL
ABS IMMATURE GRANULOCYTES: 0.07 10*3/uL (ref 0.00–0.07)
Basophils Absolute: 0 10*3/uL (ref 0.0–0.1)
Basophils Relative: 0 %
Eosinophils Absolute: 0 10*3/uL (ref 0.0–0.5)
Eosinophils Relative: 0 %
Immature Granulocytes: 1 %
Lymphocytes Relative: 9 %
Lymphs Abs: 0.9 10*3/uL (ref 0.7–4.0)
Monocytes Absolute: 0.9 10*3/uL (ref 0.1–1.0)
Monocytes Relative: 8 %
Neutro Abs: 8.5 10*3/uL — ABNORMAL HIGH (ref 1.7–7.7)
Neutrophils Relative %: 82 %

## 2018-01-30 LAB — PROTIME-INR
INR: 1.11
Prothrombin Time: 14.2 seconds (ref 11.4–15.2)

## 2018-01-30 LAB — I-STAT BETA HCG BLOOD, ED (MC, WL, AP ONLY)

## 2018-01-30 LAB — I-STAT TROPONIN, ED: Troponin i, poc: 0 ng/mL (ref 0.00–0.08)

## 2018-01-30 LAB — MRSA PCR SCREENING: MRSA by PCR: NEGATIVE

## 2018-01-30 LAB — VANCOMYCIN, RANDOM: Vancomycin Rm: <4

## 2018-01-30 LAB — APTT: aPTT: 24 seconds (ref 24–36)

## 2018-01-30 MED ORDER — STROKE: EARLY STAGES OF RECOVERY BOOK
Freq: Once | Status: DC
  Filled 2018-01-30: qty 1

## 2018-01-30 MED ORDER — SODIUM CHLORIDE 0.9 % IV SOLN
2.0000 g | Freq: Two times a day (BID) | INTRAVENOUS | Status: DC
  Administered 2018-01-31 – 2018-02-03 (×7): 2 g via INTRAVENOUS
  Filled 2018-01-30 (×11): qty 20

## 2018-01-30 MED ORDER — LEVETIRACETAM IN NACL 500 MG/100ML IV SOLN
500.0000 mg | Freq: Two times a day (BID) | INTRAVENOUS | Status: DC
  Administered 2018-01-30: 500 mg via INTRAVENOUS
  Filled 2018-01-30: qty 100

## 2018-01-30 MED ORDER — VANCOMYCIN HCL IN DEXTROSE 1-5 GM/200ML-% IV SOLN
1000.0000 mg | Freq: Three times a day (TID) | INTRAVENOUS | Status: DC
  Administered 2018-01-31 – 2018-02-01 (×4): 1000 mg via INTRAVENOUS
  Filled 2018-01-30 (×6): qty 200

## 2018-01-30 MED ORDER — FLUOXETINE HCL 20 MG PO CAPS
40.0000 mg | ORAL_CAPSULE | Freq: Every day | ORAL | Status: DC
  Administered 2018-01-31 – 2018-02-03 (×3): 40 mg via ORAL
  Filled 2018-01-30 (×5): qty 2

## 2018-01-30 MED ORDER — ACETAMINOPHEN 650 MG RE SUPP: 650.0000 mg | RECTAL | Status: DC | PRN

## 2018-01-30 MED ORDER — IOPAMIDOL (ISOVUE-370) INJECTION 76%
INTRAVENOUS | Status: AC
  Administered 2018-01-30: 100 mL
  Filled 2018-01-30: qty 100

## 2018-01-30 MED ORDER — VANCOMYCIN HCL IN DEXTROSE 1-5 GM/200ML-% IV SOLN
1000.0000 mg | INTRAVENOUS | Status: AC
  Administered 2018-01-31: 1000 mg via INTRAVENOUS
  Filled 2018-01-30: qty 200

## 2018-01-30 MED ORDER — SODIUM CHLORIDE 0.9 % IV SOLN: 2.0000 g | INTRAVENOUS | Status: DC

## 2018-01-30 MED ORDER — FLUTICASONE PROPIONATE 50 MCG/ACT NA SUSP
2.0000 | Freq: Every day | NASAL | Status: DC
  Administered 2018-01-30 – 2018-02-03 (×4): 2 via NASAL
  Filled 2018-01-30: qty 16

## 2018-01-30 MED ORDER — LEVOTHYROXINE SODIUM 75 MCG PO TABS
75.0000 ug | ORAL_TABLET | Freq: Every day | ORAL | Status: DC
  Administered 2018-01-31 – 2018-02-03 (×3): 75 ug via ORAL
  Filled 2018-01-30 (×3): qty 1

## 2018-01-30 MED ORDER — GADOBUTROL 1 MMOL/ML IV SOLN
4.0000 mL | Freq: Once | INTRAVENOUS | Status: AC | PRN
  Administered 2018-01-30: 4 mL via INTRAVENOUS

## 2018-01-30 MED ORDER — SENNOSIDES-DOCUSATE SODIUM 8.6-50 MG PO TABS: 1.0000 | ORAL_TABLET | Freq: Every evening | ORAL | Status: DC | PRN

## 2018-01-30 MED ORDER — SODIUM CHLORIDE 0.9 % IV SOLN
2.0000 g | INTRAVENOUS | Status: AC
  Administered 2018-01-30: 2 g via INTRAVENOUS
  Filled 2018-01-30: qty 20

## 2018-01-30 MED ORDER — ACETAMINOPHEN 325 MG PO TABS: 650.0000 mg | ORAL_TABLET | ORAL | Status: DC | PRN

## 2018-01-30 MED ORDER — ACETAMINOPHEN 160 MG/5ML PO SOLN: 650.0000 mg | ORAL | Status: DC | PRN

## 2018-01-30 NOTE — Code Documentation (Signed)
38 year old presents to [facility] via [facility] with stroke sx.  Arrived to ED at 0925 - code stroke was called at [phone].  Patient was met in the CT scanner by the stroke MD and stroke team.  Initial report was she was LSW at 0800 when the father got her up to go the bathroom - she used her walker to go to the bathroom - post toileting she was noted to have left side weakness and EMS was called.  Of note she is recovering from recent mennigitis at which time she lost her hearing thus making the exam difficult.  On initial exam she is very sleepy - she will arouse to stimulation but has difficulty remaining awake.  She follows cues well - can read so will follow commands based on reading prompts.  Left arm and leg are weak with drift although she can resist gravity.  Has some left side neglect and left field cut. Has gaze preference to the left.   Minor left facial droop.  Speech without aphasia - dysarthria may be related to recent hearing loss.  NIHHS 9.  CT head without contrast negative.  CTA and CTP done - negative.  As time progressed she was more alert still with left side weakness - now blinking to threat.  Dr. 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 Dr. Lorraine Lax ordering MRI.    Handoff to [facility] - to call as needed.

## 2018-01-30 NOTE — ED Notes (Signed)
Pt given sandwich bag and juice

## 2018-01-30 NOTE — H&P (Signed)
History and Physical  Emma Martin:[id number] DOB: [birth year] DOA: 01/30/2018  Referring physician: Dr Rogene Houston, ED physician PCP: Maurice Small, MD  Outpatient Specialists:   Patient Coming From: home  Chief Complaint: left sided weakness  HPI: Emma Martin is a 38 y.o. female with a history of hypertension, hypothyroidism, Turner syndrome, scoliosis status post Harrington rods, bicuspid aortic valve.  Patient recently hospitalized for streptococcal meningitis and just finished antibiotic treatment and has subsequent hearing loss.  She began having left-sided weakness this morning, particularly in her leg.  She was found by her father on the floor and unresponsive.  She was last noted well 6 AM this morning.  Her father found her on the floor at 8 AM and found her difficult to awake.  On arrival to the emergency department, code stroke called.  She was lethargic, had neglect, and displayed left arm and leg flaccidity and weakness.  TPA was not administered due to concern that the patient was having a seizure, particularly due to her recent admission for meningitis.  Her symptoms were improving as she became more alert and was following commands.  He concerns that this was seizure related, the patient was loaded with Keppra.  Patient had a CT of the head, then an MRI.  The MRI showed changes suggestive of stroke - right thalamic infarct and multiple bilateral acute infarcts.   Review of Systems:   Pt denies any fevers, chills, nausea, vomiting, diarrhea, constipation, abdominal pain, shortness of breath, dyspnea on exertion, orthopnea, cough, wheezing, palpitations, headache, vision changes, lightheadedness, dizziness, melena, rectal bleeding.  Review of systems are otherwise negative  Past Medical History:  Diagnosis Date  . Bicuspid aortic valve    , mild aortic insufficiency. (No SBE prophylaxis needed)  . Complication of anesthesia   . Dyslipidemia   . Generalized anxiety  disorder    , with obsessive-compulsive traits.  . Hypertension   . Hypothyroidism   . Mixed gonadal dysgenesis    , (additional Y-bearing cell line) for which she reportedly underwent bilateral gonadectomy (patient unaware; needs confirmation) due to malignancy potential.  . PONV (postoperative nausea and vomiting)   . Primary amenorrhea    , treated starting at age 15 or 83 with estrogen and progesterone  . Scoliosis    , treated with Harrington rods (1997) for stabilization.  . Seasonal allergic rhinitis   . Short stature    , previously treated with growth hormone age 9-15 years.  Radford Pax syndrome    , previously followed by pediatric endocrinologist Freddy Jaksch, MD at [facility] through her early 70s).   Past Surgical History:  Procedure Laterality Date  . TEE WITHOUT CARDIOVERSION N/A 01/19/2018   Procedure: TRANSESOPHAGEAL ECHOCARDIOGRAM (TEE);  Surgeon: Jerline Pain, MD;  Location: [facility] ENDOSCOPY;  Service: Cardiovascular;  Laterality: N/A;   Social History:  reports that she has never smoked. She has never used smokeless tobacco. She reports current alcohol use of about 1.0 - 2.0 standard drinks of alcohol per week. She reports that she does not use drugs. Patient lives at home  Allergies  Allergen Reactions  . Benzoin Rash    Family History  Problem Relation Age of Onset  . Other Father        A + W  . Cancer Maternal Grandmother   . Cancer Paternal Grandfather   . Hypertension Mother   . Other Mother        Dyslipidemia  . Hypothyroidism Mother   .  Mitral valve prolapse Mother       Prior to Admission medications   Medication Sig Start Date End Date Taking? Authorizing Provider  FLUoxetine (PROZAC) 40 MG capsule Take 40 mg by mouth daily.   Yes [provider]  fluticasone (FLONASE) 50 MCG/ACT nasal spray Place 2 sprays into both nostrils daily.   Yes [provider]  ibuprofen (ADVIL,MOTRIN) 200 MG tablet Take 200-400 mg by mouth  every 8 (eight) hours as needed for headache.   Yes [provider]  levothyroxine (SYNTHROID, LEVOTHROID) 75 MCG tablet Take 75 mcg by mouth daily before breakfast.   Yes [provider]  lisinopril (PRINIVIL,ZESTRIL) 10 MG tablet Take 1 tablet (10 mg total) by mouth daily. 01/21/18  Yes Hongalgi, Lenis Dickinson, MD  Multiple Vitamins-Minerals (CENTRUM PO) Take 1 tablet by mouth daily.   Yes [provider]  norethindrone-ethinyl estradiol-iron (MICROGESTIN FE,GILDESS FE,LOESTRIN FE) 1.5-30 MG-MCG tablet Take 1 tablet by mouth daily.   Yes [provider]  cefTRIAXone (ROCEPHIN) IVPB Inject 2 g into the vein every 12 (twelve) hours for 10 days. Indication:  meningitis Last Day of Therapy:  01/29/2018 Labs - Once weekly:  CBC/D and BMP, Labs - Every other week:  ESR and CRP Patient not taking: Reported on 01/30/2018 01/20/18 01/30/18  Modena Jansky, MD  vancomycin IVPB Inject 1,000 mg into the vein every 8 (eight) hours for 10 days. Indication:  meningitis Last Day of Therapy:  01/29/2018 Labs - Sunday/Monday:  CBC/D, BMP, and vancomycin trough. Labs - Thursday:  BMP and vancomycin trough Labs - Every other week:  ESR and CRP Patient not taking: Reported on 01/30/2018 01/20/18 01/30/18  Modena Jansky, MD    Physical Exam: BP (!) 157/100   Pulse (!) 105   Resp (!) 24   Wt 49.7 kg   SpO2 100%   BMI 20.70 kg/m   . General: [place] caucasian female. Awake and alert and oriented x3. No acute cardiopulmonary distress.  Marland Kitchen HEENT: Normocephalic atraumatic.  Right and left ears normal in appearance.  Pupils equal, round, reactive to light. Extraocular muscles are intact. Sclerae anicteric and noninjected.  Moist mucosal membranes. No mucosal lesions.  . Neck: Neck supple without lymphadenopathy. No carotid bruits. No masses palpated.  . Cardiovascular: Regular rate with normal S1-S2 sounds. No murmurs, rubs, gallops auscultated. No JVD.  Marland Kitchen Respiratory: Good respiratory  effort with no wheezes, rales, rhonchi. Lungs clear to auscultation bilaterally.  No accessory muscle use. . Abdomen: Soft, nontender, nondistended. Active bowel sounds. No masses or hepatosplenomegaly  . Skin: No rashes, lesions, or ulcerations.  Dry, warm to touch. 2+ dorsalis pedis and radial pulses. . Musculoskeletal: No calf or leg pain. All major joints not erythematous nontender.  No upper or lower joint deformation.  Good ROM.  No contractures  . Psychiatric: Intact judgment and insight. Pleasant and cooperative. . Neurologic: left sided weakness. No facial droop.           Labs on Admission: I have personally reviewed following labs and imaging studies  CBC: Recent Labs  Lab 01/30/18 0927 01/30/18 0934  WBC 10.4  --   NEUTROABS 8.5*  --   HGB 12.4 13.3  HCT 38.8 39.0  MCV 88.0  --   PLT 327  --    Basic Metabolic Panel: Recent Labs  Lab 01/30/18 0927 01/30/18 0934  NA 136 136  K 3.5 3.6  CL 100 98  CO2 26  --   GLUCOSE 97 89  BUN 7 9  CREATININE 0.43* 0.20*  CALCIUM 8.8*  --    GFR: Estimated Creatinine Clearance: 73.4 mL/min (A) (by C-G formula based on SCr of 0.2 mg/dL (L)). Liver Function Tests: Recent Labs  Lab 01/30/18 0927  AST 184*  ALT 386*  ALKPHOS 216*  BILITOT 0.9  PROT 6.8  ALBUMIN 2.9*   No results for input(s): LIPASE, AMYLASE in the last 168 hours. No results for input(s): AMMONIA in the last 168 hours. Coagulation Profile: Recent Labs  Lab 01/30/18 0927  INR 1.11   Cardiac Enzymes: No results for input(s): CKTOTAL, CKMB, CKMBINDEX, TROPONINI in the last 168 hours. BNP (last 3 results) No results for input(s): PROBNP in the last 8760 hours. HbA1C: No results for input(s): HGBA1C in the last 72 hours. CBG: Recent Labs  Lab 01/30/18 0928  GLUCAP 76   Lipid Profile: No results for input(s): CHOL, HDL, LDLCALC, TRIG, CHOLHDL, LDLDIRECT in the last 72 hours. Thyroid Function Tests: No results for input(s): TSH, T4TOTAL,  FREET4, T3FREE, THYROIDAB in the last 72 hours. Anemia Panel: No results for input(s): VITAMINB12, FOLATE, FERRITIN, TIBC, IRON, RETICCTPCT in the last 72 hours. Urine analysis:    Component Value Date/Time   COLORURINE YELLOW 01/15/2018 1905   APPEARANCEUR HAZY (A) 01/15/2018 1905   LABSPEC 1.020 01/15/2018 1905   LABSPEC 1.010 11/24/2006 1033   PHURINE 6.0 01/15/2018 1905   GLUCOSEU NEGATIVE 01/15/2018 1905   HGBUR SMALL (A) 01/15/2018 1905   BILIRUBINUR NEGATIVE 01/15/2018 1905   BILIRUBINUR Negative 11/24/2006 1033   KETONESUR 5 (A) 01/15/2018 1905   PROTEINUR 100 (A) 01/15/2018 1905   NITRITE NEGATIVE 01/15/2018 1905   LEUKOCYTESUR NEGATIVE 01/15/2018 1905   LEUKOCYTESUR Negative 11/24/2006 1033   Sepsis Labs: @LABRCNTIP (procalcitonin:4,lacticidven:4) )No results found for this or any previous visit (from the past 240 hour(s)).   Radiological Exams on Admission: Ct Angio Head W Or Wo Contrast  Result Date: 01/30/2018 CLINICAL DATA:  Acute onset of left-sided weakness. EXAM: CT ANGIOGRAPHY HEAD AND NECK CT PERFUSION BRAIN TECHNIQUE: Multidetector CT imaging of the head and neck was performed using the standard protocol during bolus administration of intravenous contrast. Multiplanar CT image reconstructions and MIPs were obtained to evaluate the vascular anatomy. Carotid stenosis measurements (when applicable) are obtained utilizing NASCET criteria, using the distal internal carotid diameter as the denominator. Multiphase CT imaging of the brain was performed following IV bolus contrast injection. Subsequent parametric perfusion maps were calculated using RAPID software. CONTRAST:  119m ISOVUE-370 IOPAMIDOL (ISOVUE-370) INJECTION 76% COMPARISON:  Head CT without contrast 01/30/2018 and 01/26/2018. FINDINGS: CTA NECK FINDINGS Aortic arch: A 3 vessel arch configuration is present. No significant atherosclerotic calcifications or stenosis is present. Right carotid system: The right  common carotid artery is within normal limits. The bifurcation is unremarkable. Cervical right ICA is normal Left carotid system: The left common carotid artery is within normal limits. Left bifurcation is unremarkable. There is some tortuosity of the distal cervical left ICA without significant stenosis. Vertebral arteries: The right vertebral artery is dominant. Left vertebral artery is hypoplastic. Both vertebral arteries originate from the subclavian arteries without significant stenosis. There is no significant stenosis of either vertebral artery in the neck. Skeleton: Leftward curvature is present in the upper thoracic spine. There is mild rightward compensating curvature of the cervical spine. AP alignment is anatomic. There straightening of the normal cervical lordosis. Spinal fusion begins at T1. Other neck: The soft tissues of the neck are otherwise unremarkable. No focal mucosal lesions are  present. Subcentimeter thyroid nodules are noted. No significant adenopathy is present. Salivary glands are within normal limits. Upper chest: Lung apices are clear. Review of the MIP images confirms the above findings CTA HEAD FINDINGS Anterior circulation: The internal carotid arteries are within normal limits from the skull base through the ICA termini. There is mild irregularity of the M1 segments bilaterally without a significant stenosis or occlusion. MCA bifurcations are intact bilaterally. Mild diffuse small vessel disease is advanced for age. ACA branch vessels are intact. Posterior circulation: The right vertebral artery is dominant. PICA origins are visualized and normal. Vertebrobasilar junction is. Basilar artery is small, essentially terminating at the superior cerebellar arteries. There is a small right P1 segment. Posterior communicating arteries are present bilaterally. Venous sinuses: Dural sinuses are patent. Straight sinus deep cerebral pains are intact. Cortical veins are unremarkable. Anatomic  variants: Fetal type left posterior cerebral artery and dominant right posterior communicating artery. Review of the MIP images confirms the above findings CT Brain Perfusion Findings: CBF (<30%) Volume: 43m Perfusion (Tmax>6.0s) volume: 0664mMismatch Volume: 64m51mnfarction Location:N/a IMPRESSION: 1. No focal ischemia or tissue at risk.  Normal CT brain perfusion. 2. No emergent large vessel occlusion. 3. Medium and small vessel irregularity is noted within the anterior circulation. Vessels are small. Question underlying vasculitis. 4. Tortuosity of the distal cervical left ICA. There is no significant stenosis. This is commonly seen in the setting of hypertension. 5. Scoliosis. Electronically Signed   By: ChrSan MorelleD.   On: 01/30/2018 10:22   Ct Angio Neck W Or Wo Contrast  Result Date: 01/30/2018 CLINICAL DATA:  Acute onset of left-sided weakness. EXAM: CT ANGIOGRAPHY HEAD AND NECK CT PERFUSION BRAIN TECHNIQUE: Multidetector CT imaging of the head and neck was performed using the standard protocol during bolus administration of intravenous contrast. Multiplanar CT image reconstructions and MIPs were obtained to evaluate the vascular anatomy. Carotid stenosis measurements (when applicable) are obtained utilizing NASCET criteria, using the distal internal carotid diameter as the denominator. Multiphase CT imaging of the brain was performed following IV bolus contrast injection. Subsequent parametric perfusion maps were calculated using RAPID software. CONTRAST:  1064m57mOVUE-370 IOPAMIDOL (ISOVUE-370) INJECTION 76% COMPARISON:  Head CT without contrast 01/30/2018 and 01/26/2018. FINDINGS: CTA NECK FINDINGS Aortic arch: A 3 vessel arch configuration is present. No significant atherosclerotic calcifications or stenosis is present. Right carotid system: The right common carotid artery is within normal limits. The bifurcation is unremarkable. Cervical right ICA is normal Left carotid system: The left  common carotid artery is within normal limits. Left bifurcation is unremarkable. There is some tortuosity of the distal cervical left ICA without significant stenosis. Vertebral arteries: The right vertebral artery is dominant. Left vertebral artery is hypoplastic. Both vertebral arteries originate from the subclavian arteries without significant stenosis. There is no significant stenosis of either vertebral artery in the neck. Skeleton: Leftward curvature is present in the upper thoracic spine. There is mild rightward compensating curvature of the cervical spine. AP alignment is anatomic. There straightening of the normal cervical lordosis. Spinal fusion begins at T1. Other neck: The soft tissues of the neck are otherwise unremarkable. No focal mucosal lesions are present. Subcentimeter thyroid nodules are noted. No significant adenopathy is present. Salivary glands are within normal limits. Upper chest: Lung apices are clear. Review of the MIP images confirms the above findings CTA HEAD FINDINGS Anterior circulation: The internal carotid arteries are within normal limits from the skull base through the ICA termini. There is mild  irregularity of the M1 segments bilaterally without a significant stenosis or occlusion. MCA bifurcations are intact bilaterally. Mild diffuse small vessel disease is advanced for age. ACA branch vessels are intact. Posterior circulation: The right vertebral artery is dominant. PICA origins are visualized and normal. Vertebrobasilar junction is. Basilar artery is small, essentially terminating at the superior cerebellar arteries. There is a small right P1 segment. Posterior communicating arteries are present bilaterally. Venous sinuses: Dural sinuses are patent. Straight sinus deep cerebral pains are intact. Cortical veins are unremarkable. Anatomic variants: Fetal type left posterior cerebral artery and dominant right posterior communicating artery. Review of the MIP images confirms the  above findings CT Brain Perfusion Findings: CBF (<30%) Volume: 31m Perfusion (Tmax>6.0s) volume: 057mMismatch Volume: 30m330mnfarction Location:N/a IMPRESSION: 1. No focal ischemia or tissue at risk.  Normal CT brain perfusion. 2. No emergent large vessel occlusion. 3. Medium and small vessel irregularity is noted within the anterior circulation. Vessels are small. Question underlying vasculitis. 4. Tortuosity of the distal cervical left ICA. There is no significant stenosis. This is commonly seen in the setting of hypertension. 5. Scoliosis. Electronically Signed   By: ChrSan MorelleD.   On: 01/30/2018 10:22   Mr Brain Wo Contrast  Result Date: 01/30/2018 CLINICAL DATA:  Follow-up examination for acute stroke, left-sided weakness. Recent history of bacteremia with meningitis. EXAM: MRI HEAD WITHOUT CONTRAST TECHNIQUE: Multiplanar, multiecho pulse sequences of the brain and surrounding structures were obtained without intravenous contrast. COMPARISON:  Prior CT and CTA from earlier the same day. FINDINGS: Brain: Cerebral volume within normal limits for age. Few scattered areas of diffusion abnormality seen involving the bilateral cerebral hemispheres, suspicious for acute to early subacute ischemic infarcts. These involve the subcortical and deep white matter of the posterior right frontal region (series 5, image 90, 88). Involvement of the subcortical left frontal white matter (series 5, image 86). Associated small amount of susceptibility artifact at this location consistent with petechial hemorrhage (series 12, image 36). 14 mm focus of diffusion at the right thalamus/genu of the right internal capsule (series 5, image 81). Patchy infarcts also seen involving the peripheral right cerebellum near the brachium pontis (series 5, image 68) as well as the cerebellar vermis (series 5, image 70). Additional diffusion abnormality seen overlying the surface of the high right cerebral convexity (series 5, image  97, 94). Additional diffusion abnormality present at the suprasellar cistern (series 5, image 77). Probable mild signal abnormality surrounding the midbrain and perimesencephalic cistern as well. Constellation of findings favored to reflect sequelae of meningitis with associated pial exudate and multifocal infarcts. No other associated hemorrhage. Few additional scattered punctate micro hemorrhages seen scattered within the brain, likely related to history of hypertension. No frank intracerebral abscess or empyema identified on this noncontrast examination. No mass lesion, midline shift, or significant mass effect. Ventricles normal size without hydrocephalus. Pituitary gland suprasellar region normal. Midline structures intact and normal. Vascular: Major intracranial vascular flow voids maintained. Skull and upper cervical spine: Craniocervical junction within normal limits. Visualized upper cervical spine unremarkable. No focal marrow replacing lesion. Scalp soft tissues demonstrate no acute finding. Sinuses/Orbits: Globes and orbital soft tissues within normal limits. Left sphenoid sinus completely opacified. Scattered mucosal thickening throughout the remaining paranasal sinuses. Small right mastoid effusion noted. Inner ear structures within normal limits. Other: None. IMPRESSION: Scattered diffusion abnormality overlying the right cerebral convexity as well as the suprasellar cistern, most concerning for pial exudate related to meningitis given provided history. Associated scattered multifocal acute  to early subacute ischemic infarcts as detailed above. Specific note made of diffusion abnormality at the ventral right thalamus/internal capsule, which could account for patient's left-sided symptoms. Correlation with CSF analysis suggested. Electronically Signed   By: Jeannine Boga M.D.   On: 01/30/2018 14:41   Mr Brain W Contrast  Result Date: 01/30/2018 CLINICAL DATA:  Initial evaluation for  bacterial meningitis. EXAM: MRI HEAD WITH CONTRAST TECHNIQUE: Multiplanar, multiecho pulse sequences of the brain and surrounding structures were obtained with intravenous contrast. CONTRAST:  4 cc of Gadavist. COMPARISON:  Prior noncontrast MRI from earlier the same day. FINDINGS: Postcontrast imaging demonstrates scattered left meningeal and dural enhancement overlying the right cerebral convexity, corresponding with previously seen diffusion abnormality, compatible with meningitis. There are a few scattered small areas of focal cortical enhancement within the underlying right frontal and parietal lobes (series 6, images 49, 50), consistent with associated cerebritis. Similar leptomeningeal enhancement noted about the perimesencephalic cistern and midbrain, extending into the suprasellar cistern. Patchy enhancement within the cerebellar vermis consistent with associated cerebritis (series 6, images 25, 29). Additionally, there are scattered multifocal foci of enhancement seen involving the subcortical and deep white matter of both cerebral hemispheres, some of which correspond with previously seen infarcts. These are likely perivascular in nature, and also compatible with meningitis (series 7, images 14, 20). No frank intracerebral abscess or empyema. No significant ependymal enhancement to suggest ventriculitis. No hydrocephalus. No findings to suggest venous thrombosis. No significant mass effect or midline shift. IMPRESSION: Scattered leptomeningeal enhancement overlying the right cerebral convexity and about the perimesencephalic and supra cerebellar cisterns, most consistent with meningitis. Associated patchy post-contrast enhancement within the bilateral cerebral hemispheres and cerebellum as above, compatible with associated cerebritis. No frank intracerebral abscess or empyema at this time. Electronically Signed   By: Jeannine Boga M.D.   On: 01/30/2018 15:55   Ct Cerebral Perfusion W  Contrast  Result Date: 01/30/2018 CLINICAL DATA:  Acute onset of left-sided weakness. EXAM: CT ANGIOGRAPHY HEAD AND NECK CT PERFUSION BRAIN TECHNIQUE: Multidetector CT imaging of the head and neck was performed using the standard protocol during bolus administration of intravenous contrast. Multiplanar CT image reconstructions and MIPs were obtained to evaluate the vascular anatomy. Carotid stenosis measurements (when applicable) are obtained utilizing NASCET criteria, using the distal internal carotid diameter as the denominator. Multiphase CT imaging of the brain was performed following IV bolus contrast injection. Subsequent parametric perfusion maps were calculated using RAPID software. CONTRAST:  116m ISOVUE-370 IOPAMIDOL (ISOVUE-370) INJECTION 76% COMPARISON:  Head CT without contrast 01/30/2018 and 01/26/2018. FINDINGS: CTA NECK FINDINGS Aortic arch: A 3 vessel arch configuration is present. No significant atherosclerotic calcifications or stenosis is present. Right carotid system: The right common carotid artery is within normal limits. The bifurcation is unremarkable. Cervical right ICA is normal Left carotid system: The left common carotid artery is within normal limits. Left bifurcation is unremarkable. There is some tortuosity of the distal cervical left ICA without significant stenosis. Vertebral arteries: The right vertebral artery is dominant. Left vertebral artery is hypoplastic. Both vertebral arteries originate from the subclavian arteries without significant stenosis. There is no significant stenosis of either vertebral artery in the neck. Skeleton: Leftward curvature is present in the upper thoracic spine. There is mild rightward compensating curvature of the cervical spine. AP alignment is anatomic. There straightening of the normal cervical lordosis. Spinal fusion begins at T1. Other neck: The soft tissues of the neck are otherwise unremarkable. No focal mucosal lesions are present.  Subcentimeter  thyroid nodules are noted. No significant adenopathy is present. Salivary glands are within normal limits. Upper chest: Lung apices are clear. Review of the MIP images confirms the above findings CTA HEAD FINDINGS Anterior circulation: The internal carotid arteries are within normal limits from the skull base through the ICA termini. There is mild irregularity of the M1 segments bilaterally without a significant stenosis or occlusion. MCA bifurcations are intact bilaterally. Mild diffuse small vessel disease is advanced for age. ACA branch vessels are intact. Posterior circulation: The right vertebral artery is dominant. PICA origins are visualized and normal. Vertebrobasilar junction is. Basilar artery is small, essentially terminating at the superior cerebellar arteries. There is a small right P1 segment. Posterior communicating arteries are present bilaterally. Venous sinuses: Dural sinuses are patent. Straight sinus deep cerebral pains are intact. Cortical veins are unremarkable. Anatomic variants: Fetal type left posterior cerebral artery and dominant right posterior communicating artery. Review of the MIP images confirms the above findings CT Brain Perfusion Findings: CBF (<30%) Volume: 64m Perfusion (Tmax>6.0s) volume: 032mMismatch Volume: 2m75mnfarction Location:N/a IMPRESSION: 1. No focal ischemia or tissue at risk.  Normal CT brain perfusion. 2. No emergent large vessel occlusion. 3. Medium and small vessel irregularity is noted within the anterior circulation. Vessels are small. Question underlying vasculitis. 4. Tortuosity of the distal cervical left ICA. There is no significant stenosis. This is commonly seen in the setting of hypertension. 5. Scoliosis. Electronically Signed   By: ChrSan MorelleD.   On: 01/30/2018 10:22   Ct Head Code Stroke Wo Contrast  Result Date: 01/30/2018 CLINICAL DATA:  Code stroke. Acute onset of left-sided weakness beginning 2 hours ago. EXAM: CT  HEAD WITHOUT CONTRAST TECHNIQUE: Contiguous axial images were obtained from the base of the skull through the vertex without intravenous contrast. COMPARISON:  None. FINDINGS: Brain: No acute infarct, hemorrhage, or mass lesion is present. No significant white matter lesions are present. Basal ganglia and insular ribbon are normal bilaterally. No acute or focal cortical abnormality is present. The brainstem and cerebellum are within normal limits. Vascular: No hyperdense vessel or unexpected calcification. Skull: Calvarium is intact. No focal lytic or blastic lesions are present. Sinuses/Orbits: Previous facial surgery is again noted. A posterior right ethmoid air cell and the left sphenoid sinus are chronically opacified. Mastoid air cells are patent. The globes and orbits are within normal limits. ASPECTS ([facility] Early CT Score) - Ganglionic level infarction (caudate, lentiform nuclei, internal capsule, insula, M1-M3 cortex): 7/7 - Supraganglionic infarction (M4-M6 cortex): 3/3 Total score (0-10 with 10 being normal): 10/10 IMPRESSION: 1. Normal CT appearance of the brain. 2. Postsurgical changes of the anterior maxillary sinuses and nasal bones. 3. Stable chronic sinus opacification. 4. ASPECTS is 10/10 Electronically Signed   By: ChrSan MorelleD.   On: 01/30/2018 09:51    EKG: Independently reviewed. Sinus rhythm, left atrial enlargement  Assessment/Plan: Principal Problem:   Acute ischemic stroke (HCC) Active Problems:   HTN (hypertension)   Hypothyroidism   Pneumococcal meningitis   Turner syndrome    This patient was discussed with the ED physician, including pertinent vitals, physical exam findings, labs, and imaging.  We also discussed care given by the ED provider.  1. Acute ischemic Stroke Admit on telemetry ? Embolic from cardiogenic source Echocardiogram tomorrow - will need to do prior to TEE Blood cultures, start vancomycin Hemoglobin A1c, lipid panel in  the morning PT/OT/speech therapy consult Permissive hypertension   2. HTN a. Hold antihypertensives 3. Pneumococcal  meningitis a. Residual deafness 4. Turner syndrome a.  5. Hypothyroidism a. On synthroid  DVT prophylaxis: SCDs, hold anticoag per neuro Consultants: Neurology Code Status: Full Family Communication: father, sister in room  Disposition Plan: admit    [facility] Pager [phone]  If 7PM-7AM, please contact night-coverage [email or web address] Password [email or web address]

## 2018-01-30 NOTE — ED Triage Notes (Signed)
Pt arrives to ED by [facility] as a Code Stroke with complaints of lethargy, left sided weakness, and left sided facial droop. EMS reports pt was hard to arouse this morning but when family finally got pt up from bed they noticed pt was weak on her left arm and left leg as she was walking to the bathroom with her walker. Pt's last known normal was 0800 per pt's father.

## 2018-01-30 NOTE — ED Notes (Signed)
Pt back in room from [place]

## 2018-01-30 NOTE — ED Notes (Signed)
Patient transported to MRI. Neurologist at bedside.

## 2018-01-30 NOTE — Consult Note (Signed)
Neurology Consultation  Reason for Consult: Code stroke - seizure r/o  Referring Physician:   CC: Trouble awakening, Left arm and leg weakness  History is obtained from:Patients father   HPI: Emma Martin is a 38 y.o. female with a past medical history for hypertension, bicuspid aortic valve, Turner syndrome, streptococcal bacteremia and pneumococcal meningitis 2 weeks prior, on long-term IV antibiotics, subsequent loss of hearing loss.  She presents today on 01/30/2018 as a code stroke to the ER after being found unresponsive and subsequent left-sided weakness this morning by her father.  Last known well with actually at 0600 this morning.    Apparently her father had been waking her up every hour or two for toileting as she had become incontinent since her sequela of meningitis.  He states that when he woke up at 6:00 this morning he was without concern as she looked normal at her baseline.  He returned at about 8:00, to take her to the bathroom and noticed that she was hard to awake.  He states that did shake and apply noxious stimuli for her to awake.  He took approximately 10 minutes to awake.  He states once awaken he prepared her to ambulate to the bathroom with her walker as she generally does.  He states that most time he does walk in front of her to lead her to the bathroom, but did not overall notice any weakness or trouble with ambulation.  He did notice on the way coming from the bathroom that her left arm was drooping and she was dragging her left foot.  He was able to help her get onto the bed.  He states that he mobilized her downstairs on a lift that was previously in their home for his wife who had ALS.  At that time he notified EMS of her symptoms and she was transferred to the ER for further evaluation.  Upon arrival she was lethargic, appeared with neglect, displayed left arm and leg flaccidity and weakness.  TPA was not administered suspicion for possible seizure, primarily due  to her recent admission for meningitis, and risk for mycotic aneurysm w/ high risk for bleed. Risks outweigh the benefits.  As time progressed her symptoms improved, she became more awake, was able to follow commands with prompting as she is deaf and lip reads communication.  She remained with left upper extremity weakness which was suspicious Todd's paralysis which can be seen with seizures and commonly compared to stroke like symptoms. There is no hx of seizure in patient nor family. No obvious signs of seizures were noted such as body tremors, tongue biting, or loss of bowel or bladder.   LKW: 0600 tpa given?: no, possible seizure, recent admission for meningitis, risk for mycotic aneurysm and high risk for bleed  Premorbid modified Rankin scale (mRS): 3 - deaf, baseline turner syndrome    ROS: A 14 point ROS was performed and is negative except as noted in the HPI.  Unable to obtain due to altered mental status, and patient is deaf. 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   Past Medical History:  Diagnosis Date  . Bicuspid aortic valve    , mild aortic insufficiency. (No SBE prophylaxis needed)  . Complication of anesthesia   . Dyslipidemia   . Generalized anxiety disorder    , with obsessive-compulsive traits.  . Hypertension   . Hypothyroidism   . Mixed gonadal dysgenesis    , (additional  Y-bearing cell line) for which she reportedly underwent bilateral gonadectomy (patient unaware; needs confirmation) due to malignancy potential.  . PONV (postoperative nausea and vomiting)   . Primary amenorrhea    , treated starting at age 71 or 65 with estrogen and progesterone  . Scoliosis    , treated with Harrington rods (1997) for stabilization.  . Seasonal allergic rhinitis   . Short stature    , previously treated with growth hormone age 54-15 years.  Radford Pax syndrome    , previously followed by pediatric endocrinologist Freddy Jaksch, MD at  [facility] through her early 60s).   3-Moderate disability-requires help but walks WITHOUT assistance Essential (primary) hypertension   Family History  Problem Relation Age of Onset  . Other Father        A + W  . Cancer Maternal Grandmother   . Cancer Paternal Grandfather   . Hypertension Mother   . Other Mother        Dyslipidemia  . Hypothyroidism Mother   . Mitral valve prolapse Mother   Mother hx of ALS    Social History:   reports that she has never smoked. She has never used smokeless tobacco. She reports current alcohol use of about 1.0 - 2.0 standard drinks of alcohol per week. She reports that she does not use drugs. Patient resides with her father who is her primary care provider.  Medications  Current Facility-Administered Medications:  .  iopamidol (ISOVUE-370) 76 % injection, , , ,   Current Outpatient Medications:  .  cefTRIAXone (ROCEPHIN) IVPB, Inject 2 g into the vein every 12 (twelve) hours for 10 days. Indication:  meningitis Last Day of Therapy:  01/29/2018 Labs - Once weekly:  CBC/D and BMP, Labs - Every other week:  ESR and CRP, Disp: 20 Units, Rfl: 0 .  FLUoxetine (PROZAC) 40 MG capsule, Take 40 mg by mouth daily., Disp: , Rfl:  .  fluticasone (FLONASE) 50 MCG/ACT nasal spray, Place 2 sprays into both nostrils daily., Disp: , Rfl:  .  levothyroxine (SYNTHROID, LEVOTHROID) 75 MCG tablet, Take 75 mcg by mouth daily before breakfast., Disp: , Rfl:  .  lisinopril (PRINIVIL,ZESTRIL) 10 MG tablet, Take 1 tablet (10 mg total) by mouth daily., Disp: 30 tablet, Rfl: 0 .  meclizine (ANTIVERT) 25 MG tablet, Take 25 mg by mouth 3 (three) times daily as needed for dizziness., Disp: , Rfl:  .  Multiple Vitamins-Minerals (CENTRUM PO), Take 1 tablet by mouth daily., Disp: , Rfl:  .  norethindrone-ethinyl estradiol-iron (MICROGESTIN FE,GILDESS FE,LOESTRIN FE) 1.5-30 MG-MCG tablet, Take 1 tablet by mouth daily., Disp: , Rfl:  .  vancomycin IVPB, Inject 1,000 mg into the  vein every 8 (eight) hours for 10 days. Indication:  meningitis Last Day of Therapy:  01/29/2018 Labs - Sunday/Monday:  CBC/D, BMP, and vancomycin trough. Labs - Thursday:  BMP and vancomycin trough Labs - Every other week:  ESR and CRP, Disp: 30 Units, Rfl: 0 Exam: Current vital signs: There were no vitals taken for this visit. Vital signs in last 24 hours:    GENERAL: Lethargic on original exam HEENT: - Normocephalic and atraumatic, dry mm, no LN++, no Thyromegally LUNGS - Clear to auscultation bilaterally with no wheezes CV -RRR, no m/r/g, equal pulses bilaterally. ABDOMEN - Soft, nontender, nondistended with normoactive BS Ext: warm, well perfused, intact peripheral pulses, no edema  NEURO:  Mental Status: able to state name Language: speech is baseline dysarthric. Father states that she can generally articulate.  Does read and  Naming intact. Does mimic, follow commands with prompting Cranial Nerves: Originally presented with gaze preference to right, no blink to threat on left appeared as neglect as she become more alert she does cross midline and tracks bilaterally. PERRL 3 mm/brisk. EOMI, visual fields full, no facial asymmetry, facial sensation intact, hearing intact, tongue/uvula/soft palate midline, normal  No evidence of tongue atrophy or fibrillations Motor:  Generalized weakness t/o weakness greater on L side. RUE 4/5, RLE 4/5, LUE with full drift originally then later improved with drift+2 . Left Weak hand grasp, LLE +2  Tone: is normal and bulk is normal  Sensation- Intact to light touch bilaterally, nods yes- exams varies. At time extinction  Coordination: not tested with current LOS and loss of hearing- complex  Gait- deferred  NIHSS 4+   Labs I have reviewed labs in epic and the results pertinent to this consultation are:   CBC    Component Value Date/Time   WBC 26.6 (H) 01/20/2018 0507   RBC 4.34 01/20/2018 0507   HGB 13.3 01/30/2018 0934   HGB 14.0 01/19/2007  1139   HCT 39.0 01/30/2018 0934   HCT 41.5 01/19/2007 1139   PLT 279 01/20/2018 0507   PLT 260 01/19/2007 1139   MCV 87.3 01/20/2018 0507   MCV 82.1 01/19/2007 1139   MCH 28.1 01/20/2018 0507   MCHC 32.2 01/20/2018 0507   RDW 13.2 01/20/2018 0507   RDW 13.2 01/19/2007 1139   LYMPHSABS 1.8 01/17/2018 0533   LYMPHSABS 1.3 01/19/2007 1139   MONOABS 0.5 01/17/2018 0533   MONOABS 0.5 01/19/2007 1139   EOSABS 0.0 01/17/2018 0533   EOSABS 0.1 01/19/2007 1139   BASOSABS 0.0 01/17/2018 0533   BASOSABS 0.1 01/19/2007 1139    CMP     Component Value Date/Time   NA 136 01/30/2018 0934   K 3.6 01/30/2018 0934   CL 98 01/30/2018 0934   CO2 22 01/20/2018 0507   GLUCOSE 89 01/30/2018 0934   BUN 9 01/30/2018 0934   CREATININE 0.20 (L) 01/30/2018 0934   CALCIUM 7.5 (L) 01/20/2018 0507   PROT 5.7 (L) 01/16/2018 0327   ALBUMIN 2.2 (L) 01/16/2018 0327   AST 29 01/16/2018 0327   ALT 30 01/16/2018 0327   ALKPHOS 126 01/16/2018 0327   BILITOT 0.7 01/16/2018 0327   GFRNONAA >60 01/20/2018 0507   GFRAA >60 01/20/2018 0507    Lipid Panel  No results found for: CHOL, TRIG, HDL, CHOLHDL, VLDL, LDLCALC, LDLDIRECT   Imaging I have reviewed the images obtained:  CT-scan of the brain 1. Normal CT appearance of the brain. 2. Postsurgical changes of the anterior maxillary sinuses and nasal bones. 3. Stable chronic sinus opacification. 4. ASPECTS is 10/10  CTA  Head neck/ Perfusion  1. No focal ischemia or tissue at risk.  Normal CT brain perfusion. 2. No emergent large vessel occlusion. 3. Medium and small vessel irregularity is noted within the anterior circulation. Vessels are small. Question underlying vasculitis. 4. Tortuosity of the distal cervical left ICA. There is no significant stenosis. This is commonly seen in the setting of hypertension. 5. Scoliosis.  MRI examination of the brain Pending   Assessment:  ARDEN AXON is a 38 y.o. female with a past medical history for  hypertension, bicuspid aortic valve, Turner syndrome, streptococcal bacteremia and pneumococcal meningitis 2 weeks prior, on long-term IV antibiotics, subsequent loss of hearing loss.  He presents today on 01/30/2018 as a code stroke to the ER after  being found unresponsive and subsequent left-sided weakness this morning by her father.  Last known well with actually at 0600 this morning. She appeared hard to awake for approximately 10 min. No signs of loss of bowel or bladder, tongue biting or overall notice of seizure activity.   TPA was not administered suspicion for possible seizure, primarily due to her recent admission for meningitis, and risk for mycotic aneurysm w/ high risk for bleed.  Risks outweigh the benefits.  As time progressed her symptoms improved, she became more awake, was able to follow commands with prompting as she is deaf and lip reads communication.  She remained with left upper extremity weakness which was suspicious Todd's paralysis which can be seen with seizures and commonly compared to stroke like symptoms.  Impression: Stroke versus seizure w/Todd's paralysis- etiology of either unknown. Apparently she just completed antibiotics yesterday for meningitis. MRI pending for further evaluation.    Patient did have what appeared to be neglect on her left side, generally with seizures gaze would most commonly be away from the seizure and stroke gaze towards, and affected extremities of were on the left side.  Father's history of events  does support evidence of patient being in a post ictal state seen present when she got to the hospital with presentation of lethargy which later improved.  Her left side weakness could be indicative of Todd's paralysis.     Recommendations - per attending addendum   Emma Martin, neuro hospitalist NP    NEUROHOSPITALIST ADDENDUM Performed a face to face diagnostic evaluation.   I have reviewed the contents of history and physical exam as  documented by PA/ARNP/Resident and agree with above documentation.  I have discussed and formulated the above plan as documented. Edits to the note have been made as needed.  48-year-old female with recent diagnosis of bacterial meningitis currently completed antibiotics course presents to the ED as a stroke alert.  Initially ED stated last known normal was 8 AM, however after speaking with her father it appears this is most likely 6 AM.  Insert meningitis patient father has been helping her get to the bathroom she has had frequent incontinence.  She was fine at 6 AM.  He woke her up again at 8 AM and he noticed her dragging her leg when she came back from the bathroom.  She was brought to [facility], ER as a stroke alert.  On arrival patient was somnolent, at 3 out of 5 strength in both upper and lower left extremities.  She also had some mild left sided neglect.  CT head did not show any acute findings.  tPA was not given due to recent bacterial meningitis and concerned that if she were to have endocarditis/mycotic aneurysm she would have a life-threatening hemorrhage and risk of TPA was not with the benefit.  CT angiogram and CT perfusion was performed which was negative for LVO or restriction diffusion.  Seizure with postictal Todd's was also considered and empirically started on Keppra.  However MRI return with a right thalamic infarct as well as bilateral hemispheric smaller acute infarcts.  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 Bacterial meningitis   Etiology: Possibly cardioembolic rule out endocarditis  Recommendations #Transesophogeal Echo to r/o endocarditis # Blood cultures # consider empiric antibiotics # No ASA, Sq heparin  #Start or continue Atorvastatin 40 mg/other high intensity statin # BP goal: permissive HTN upto 220/120 mmHg ( 185/110 if patient has CHF, CKD) # HBAIC and  Lipid profile # Telemetry monitoring # Frequent neuro checks # NPO  until passes stroke swallow screen # Will cancel EEG and Echocardiogram   Please page stroke NP  Or  PA  Or MD from 8am -4 pm  as this patient from this time will be  followed by the stroke.   You can look them up on [email or web address]  Password [facility]    Emma Addison Linnie Mcglocklin MD [facility] [phone]   If 7pm to 7am, please call on call as listed on AMION.

## 2018-01-30 NOTE — Progress Notes (Signed)
Attempted EEG, pt was not in room at the time. Will attempt again when schedule permits.

## 2018-01-30 NOTE — Progress Notes (Signed)
Pharmacy Antibiotic Note  Emma Martin is a 38 y.o. female recently admitted 12/27-1/1 with pneumococcal meningitis/bacteremia and was discharged home to complete a course of Vancomycin + Rocephin thru 1/10. The patient represents on 01/30/2018 with L-sided weakness concerning for CVA with imaging showing a R-thalamic infarct and multiple B/L acute infarcts raising concern for endocarditis given the patient's recent infection. Noted TEE done last admit on 12/31 and did not show any vegetations. Pharmacy has been consulted for Vancomycin dosing along with Rocephin per MD - will keep higher dosing due to meningitis and new infarcts.   Given the patient's recent antibiotic course - a Vancomycin random was obtained on admission - which showed an undetectable level. Will resume dosing based on prior OPAT and levels drawn at [facility] in December but plan to recheck levels at steady state this admit. SCr 0.2  Plan: - Vanc 1g IV every 8 hours - Rocephin 2g IV every 12 hours per MD - F/u ID consult, recs tomorrow - Will continue to follow renal function, culture results, LOT, and antibiotic de-escalation plans   Weight: 109 lb 9.1 oz (49.7 kg)  No data recorded.  Recent Labs  Lab 01/30/18 0927 01/30/18 0934  WBC 10.4  --   CREATININE 0.43* 0.20*    Estimated Creatinine Clearance: 73.4 mL/min (A) (by C-G formula based on SCr of 0.2 mg/dL (L)).    Allergies  Allergen Reactions  . Benzoin Rash    Antimicrobials this admission: Vanc 1/11 >> Rocephin 1/11 >>  Dose adjustments this admission: n/a  Microbiology results: 1/11 BCx >> 1/11 MRSA PCR >>  Thank you for allowing pharmacy to be a part of this patient's care.  Alycia Rossetti, PharmD, BCPS Clinical Pharmacist 01/30/2018 7:09 PM   **Pharmacist phone directory can now be found on [email or web address] ([email or web address]).  Listed under [place].

## 2018-01-30 NOTE — ED Provider Notes (Signed)
[facility] EMERGENCY DEPARTMENT Provider Note   CSN: [id number] Arrival date & time: 01/30/18  7425     History   Chief Complaint Chief Complaint  Patient presents with  . Code Stroke    HPI Emma Martin is a 38 y.o. female.  Patient brought in by EMS.  Patient with sudden onset of left-sided weakness last seen normal at 8 in the morning.  Patient did arrive as a code stroke.  Seen by the stroke team.  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  Did have sequelae from this.  And is now has significant hearing loss.  But can communicate with patient with writing.  Upon arrival patient would follow commands.     Past Medical History:  Diagnosis Date  . Bicuspid aortic valve    , mild aortic insufficiency. (No SBE prophylaxis needed)  . Complication of anesthesia   . Dyslipidemia   . Generalized anxiety disorder    , with obsessive-compulsive traits.  . Hypertension   . Hypothyroidism   . Mixed gonadal dysgenesis    , (additional Y-bearing cell line) for which she reportedly underwent bilateral gonadectomy (patient unaware; needs confirmation) due to malignancy potential.  . PONV (postoperative nausea and vomiting)   . Primary amenorrhea    , treated starting at age 19 or 71 with estrogen and progesterone  . Scoliosis    , treated with Harrington rods (1997) for stabilization.  . Seasonal allergic rhinitis   . Short stature    , previously treated with growth hormone age 19-15 years.  Radford Pax syndrome    , previously followed by pediatric endocrinologist Freddy Jaksch, MD at [facility] through her early 48s).    Patient Active Problem List   Diagnosis Date Noted  . 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 Bacteremia   . Meningitis   . Cerebrovascular accident (CVA) ([place])   . Benign essential HTN   . Tachypnea   . Sensorineural hearing loss (SNHL) of both ears 01/31/2018  .  Dyslipidemia 01/31/2018  . Acute ischemic stroke ([place]) 01/30/2018  . Pneumococcal bacteremia   . Pneumococcal meningitis   . Turner syndrome   . Hypothyroidism 01/15/2018  . Generalized anxiety disorder 01/15/2018  . Bicuspid aortic valve 04/06/2013  . HTN (hypertension) 04/06/2013    Past Surgical History:  Procedure Laterality Date  . TEE WITHOUT CARDIOVERSION N/A 01/19/2018   Procedure: TRANSESOPHAGEAL ECHOCARDIOGRAM (TEE);  Surgeon: Jerline Pain, MD;  Location[facility] ENDOSCOPY;  Service: Cardiovascular;  Laterality: N/A;  . TEE WITHOUT CARDIOVERSION N/A 02/02/2018   Procedure: TRANSESOPHAGEAL ECHOCARDIOGRAM (TEE);  Surgeon: Elouise Munroe, MD;  Location: [facility];  Service: Cardiology;  Laterality: N/A;     OB History   No obstetric history on file.      Home Medications    Prior to Admission medications   Medication Sig Start Date End Date Taking? Authorizing Provider  fluticasone (FLONASE) 50 MCG/ACT nasal spray Place 2 sprays into both nostrils daily.   Yes [provider]  Multiple Vitamins-Minerals (CENTRUM PO) Take 1 tablet by mouth daily.   Yes [provider]  acetaminophen (TYLENOL) 325 MG tablet Take 2 tablets (650 mg total) by mouth every 4 (four) hours as needed for mild pain (or temp > 37.5 C (99.5 F)). 02/03/18   Raiford Noble Latif, DO  atorvastatin (LIPITOR) 20 MG tablet Take 1 tablet (20 mg total) by mouth daily at 6 PM. 02/11/18  Angiulli, Lavon Paganini, PA-C  cefTRIAXone (ROCEPHIN) IVPB Inject 2 g into the vein every 12 (twelve) hours. Indication: Meningitis Last Day of Therapy: 02/26/2018 Labs - Once weekly:  CBC/D and BMP, Labs - Every other week:  ESR and CRP 02/11/18   Kirsteins, Luanna Salk, MD  FLUoxetine (PROZAC) 40 MG capsule Take 1 capsule (40 mg total) by mouth daily. 02/11/18   Angiulli, Lavon Paganini, PA-C  levothyroxine (SYNTHROID, LEVOTHROID) 75 MCG tablet Take 1 tablet (75 mcg total) by mouth daily before breakfast. 02/11/18   Angiulli,  Lavon Paganini, PA-C  magnesium oxide (MAG-OX) 400 (241.3 Mg) MG tablet Take 1 tablet (400 mg total) by mouth 2 (two) times daily. 02/11/18   Angiulli, Lavon Paganini, PA-C  potassium chloride SA (K-DUR,KLOR-CON) 20 MEQ tablet Take 2 tablets (40 mEq total) by mouth 3 (three) times daily. 02/12/18   Angiulli, Lavon Paganini, PA-C  senna-docusate (SENOKOT-S) 8.6-50 MG tablet Take 1 tablet by mouth at bedtime as needed for mild constipation. 02/03/18   Raiford Noble Latif, DO  vancomycin IVPB Inject 1,000 mg into the vein every 6 (six) hours. Indication: Meningitis Last Day of Therapy: 02/26/2018 Labs - Sunday/Monday:  CBC/D, BMP, and vancomycin trough. Labs - Thursday:  BMP and vancomycin trough Labs - Every other week:  ESR and CRP 02/11/18   Kirsteins, Luanna Salk, MD    Family History Family History  Problem Relation Age of Onset  . Other Father        A + W  . Cancer Maternal Grandmother   . Cancer Paternal Grandfather   . Hypertension Mother   . Other Mother        Dyslipidemia  . Hypothyroidism Mother   . Mitral valve prolapse Mother     Social History Social History   Tobacco Use  . Smoking status: Never Smoker  . Smokeless tobacco: Never Used  Substance Use Topics  . Alcohol use: Yes    Alcohol/week: 1.0 - 2.0 standard drinks    Types: 1 - 2 Glasses of wine per week    Comment: Occasionally  . Drug use: No     Allergies   Benzoin   Review of Systems Review of Systems  Constitutional: Negative for chills and fever.  HENT: Positive for hearing loss. Negative for rhinorrhea and sore throat.   Eyes: Negative for visual disturbance.  Respiratory: Negative for cough and shortness of breath.   Cardiovascular: Negative for chest pain and leg swelling.  Gastrointestinal: Negative for abdominal pain, diarrhea, nausea and vomiting.  Genitourinary: Negative for dysuria.  Musculoskeletal: Negative for back pain and neck pain.  Skin: Negative for rash.  Neurological: Positive for weakness.  Negative for dizziness, light-headedness and headaches.  Hematological: Does not bruise/bleed easily.  Psychiatric/Behavioral: Positive for confusion.     Physical Exam Updated Vital Signs BP 140/89 (BP Location: Left Arm)   Pulse 90   Temp 99.3 F (37.4 C) (Oral)   Resp 15   Ht 1.549 m (5' 1" )   Wt 50.8 kg   SpO2 100%   BMI 21.16 kg/m   Physical Exam Vitals signs and nursing note reviewed.  Constitutional:      General: She is not in acute distress.    Appearance: She is well-developed.  HENT:     Head: Normocephalic and atraumatic.  Eyes:     Conjunctiva/sclera: Conjunctivae normal.  Neck:     Musculoskeletal: Neck supple.  Cardiovascular:     Rate and Rhythm: Normal rate and regular rhythm.  Heart sounds: No murmur.  Pulmonary:     Effort: Pulmonary effort is normal. No respiratory distress.     Breath sounds: Normal breath sounds.  Abdominal:     Palpations: Abdomen is soft.     Tenderness: There is no abdominal tenderness.  Musculoskeletal: Normal range of motion.  Skin:    General: Skin is warm and dry.  Neurological:     Mental Status: She is alert. Mental status is at baseline.     Motor: Weakness present.      ED Treatments / Results  Labs (all labs ordered are listed, but only abnormal results are displayed) Labs Reviewed  DIFFERENTIAL - Abnormal; Notable for the following components:      Result Value   Neutro Abs 8.5 (*)    All other components within normal limits  COMPREHENSIVE METABOLIC PANEL - Abnormal; Notable for the following components:   Creatinine, Ser 0.43 (*)    Calcium 8.8 (*)    Albumin 2.9 (*)    AST 184 (*)    ALT 386 (*)    Alkaline Phosphatase 216 (*)    All other components within normal limits  LIPID PANEL - Abnormal; Notable for the following components:   Cholesterol 214 (*)    LDL Cholesterol 152 (*)    All other components within normal limits  VANCOMYCIN, TROUGH - Abnormal; Notable for the following  components:   Vancomycin Tr 11 (*)    All other components within normal limits  BASIC METABOLIC PANEL - Abnormal; Notable for the following components:   Sodium 133 (*)    Potassium 3.3 (*)    Chloride 96 (*)    Glucose, Bld 101 (*)    Calcium 8.5 (*)    All other components within normal limits  COMPREHENSIVE METABOLIC PANEL - Abnormal; Notable for the following components:   Sodium 133 (*)    Creatinine, Ser 0.40 (*)    Calcium 8.3 (*)    Total Protein 5.8 (*)    Albumin 2.6 (*)    ALT 187 (*)    Alkaline Phosphatase 148 (*)    All other components within normal limits  VANCOMYCIN, TROUGH - Abnormal; Notable for the following components:   Vancomycin Tr 8 (*)    All other components within normal limits  I-STAT CHEM 8, ED - Abnormal; Notable for the following components:   Creatinine, Ser 0.20 (*)    Calcium, Ion 1.09 (*)    All other components within normal limits  CULTURE, BLOOD (ROUTINE X 2)  CULTURE, BLOOD (ROUTINE X 2)  MRSA PCR SCREENING  PROTIME-INR  APTT  CBC  VANCOMYCIN, RANDOM  HEMOGLOBIN A1C  CBC WITH DIFFERENTIAL/PLATELET  MAGNESIUM  PHOSPHORUS  I-STAT TROPONIN, ED  CBG MONITORING, ED  I-STAT BETA HCG BLOOD, ED (MC, [facility], AP ONLY)    EKG EKG Interpretation  Date/Time:  Saturday January 30 2018 10:14:48 EST Ventricular Rate:  95 PR Interval:    QRS Duration: 83 QT Interval:  372 QTC Calculation: 468 R Axis:   87 Text Interpretation:  Sinus rhythm Probable left atrial enlargement Nonspecific T abnormalities, anterior leads Confirmed by Fredia Sorrow [phone]) on 01/30/2018 10:24:46 AM Also confirmed by Fredia Sorrow [phone]), editor Philomena Doheny [phone])  on 01/30/2018 2:17:42 PM   Radiology No results found.  Procedures Procedures (including critical care time)  CRITICAL CARE Performed by: Fredia Sorrow Total critical care time: 30 minutes Critical care time was exclusive of separately billable procedures and treating other  patients.  Critical care was necessary to treat or prevent imminent or life-threatening deterioration. Critical care was time spent personally by me on the following activities: development of treatment plan with patient and/or surrogate as well as nursing, discussions with consultants, evaluation of patient's response to treatment, examination of patient, obtaining history from patient or surrogate, ordering and performing treatments and interventions, ordering and review of laboratory studies, ordering and review of radiographic studies, pulse oximetry and re-evaluation of patient's condition.   Medications Ordered in ED Medications  iopamidol (ISOVUE-370) 76 % injection (100 mLs  Contrast Given 01/30/18 0945)  gadobutrol (GADAVIST) 1 MMOL/ML injection 4 mL (4 mLs Intravenous Contrast Given 01/30/18 1540)  cefTRIAXone (ROCEPHIN) 2 g in sodium chloride 0.9 % 100 mL IVPB (2 g Intravenous New Bag/Given 01/30/18 1903)  vancomycin (VANCOCIN) IVPB 1000 mg/200 mL premix ( Intravenous Stopped 01/31/18 0210)     Initial Impression / Assessment and Plan / ED Course  I have reviewed the triage vital signs and the nursing notes.  Pertinent labs & imaging results that were available during my care of the patient were reviewed by me and considered in my medical decision making (see chart for details).     Patient brought in by EMS.  Patient with sudden onset of left-sided weakness last seen normal at 8 in the morning.  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  Patient also has a history of Turner syndrome and bicuspid aortic valve.  Due to the meningitis patient has hearing loss.  Patient was just generally weak but did not have any significant focal deficits from what we can tell.  Until this morning.  Code stroke called.  Patient went to CT scan.  Neurology involved.  CT angios will be done.  CT head without any acute findings on initial evaluation by  myself.  Neurology is thinking that patient may have had a seizure.  And that this is Todd's paralysis on her presentation with the left-sided weakness.  They also have ordered MRI.  Her CT angios and perfusion studies without any acute findings.  MRI is pending.  Neurology recommended starting additional [place].  Follow-up MRI raises concerns for acute stroke.  Also does have some subtle changes of the past meningitis.  But no active meningitis.  Hospitalist will see patient for admission as per request of the neuro hospitalist.  Patient with significant improvement here now eating back to baseline according to family.  Final Clinical Impressions(s) / ED Diagnoses   Final diagnoses:  Cerebrovascular accident (CVA), unspecified mechanism [facility])    ED Discharge Orders         Ordered    Home infusion instructions [facility] May follow [place] Dosing Protocol; May administer Cathflo as needed to maintain patency of vascular access device.; [place] of vascular access device: per [facility] Protocol: 0.9% NaCl pre/post medica...     02/03/18 1404    cefTRIAXone (ROCEPHIN) IVPB  Every 12 hours,   Status:  Discontinued     02/03/18 1404    vancomycin IVPB  Every 8 hours,   Status:  Discontinued     02/03/18 1404    atorvastatin (LIPITOR) 20 MG tablet  Daily-1800,   Status:  Discontinued     02/03/18 1404    senna-docusate (SENOKOT-S) 8.6-50 MG tablet  At bedtime PRN     02/03/18 1404    acetaminophen (TYLENOL) 325 MG tablet  Every 4 hours PRN     02/03/18 1404  Increase activity slowly     02/03/18 1404    Diet - low sodium heart healthy     02/03/18 1404    Discharge instructions    Comments:  You were cared for by a hospitalist during your hospital stay. If you have any questions about your discharge medications or the care you received while you were in the hospital after you are discharged, you can call the unit and ask to speak with the hospitalist on call if the hospitalist  that took care of you is not available. Once you are discharged, your primary care physician will handle any further medical issues. Please note that NO REFILLS for any discharge medications will be authorized once you are discharged, as it is imperative that you return to your primary care physician (or establish a relationship with a primary care physician if you do not have one) for your aftercare needs so that they can reassess your need for medications and monitor your lab values.  Follow up with PCP, Neurology, and Infectious Diseases. Take all medications as prescribed. If symptoms change or worsen please return to the ED for evaluation   02/03/18 1404    Call MD for:  temperature >100.4     02/03/18 1404    Call MD for:  persistant nausea and vomiting     02/03/18 1404    Call MD for:  severe uncontrolled pain     02/03/18 1404    Call MD for:  redness, tenderness, or signs of infection (pain, swelling, redness, odor or green/yellow discharge around incision site)     02/03/18 1404    Call MD for:  difficulty breathing, headache or visual disturbances     02/03/18 1404    Call MD for:  hives     02/03/18 1404    Call MD for:  persistant dizziness or light-headedness     02/03/18 1404    Call MD for:  extreme fatigue     02/03/18 1404    vancomycin IVPB  Every 6 hours,   Status:  Discontinued     02/03/18 1714           Fredia Sorrow, MD 02/16/18 0107

## 2018-01-30 NOTE — ED Notes (Signed)
Patient transported to MRI 

## 2018-01-31 DIAGNOSIS — H903 Sensorineural hearing loss, bilateral: Secondary | ICD-10-CM | POA: Diagnosis present

## 2018-01-31 DIAGNOSIS — E785 Hyperlipidemia, unspecified: Secondary | ICD-10-CM | POA: Diagnosis present

## 2018-01-31 DIAGNOSIS — I6340 Cerebral infarction due to embolism of unspecified cerebral artery: Principal | ICD-10-CM

## 2018-01-31 DIAGNOSIS — R7881 Bacteremia: Secondary | ICD-10-CM

## 2018-01-31 DIAGNOSIS — G0490 Encephalitis and encephalomyelitis, unspecified: Secondary | ICD-10-CM

## 2018-01-31 DIAGNOSIS — Z8661 Personal history of infections of the central nervous system: Secondary | ICD-10-CM

## 2018-01-31 DIAGNOSIS — Q231 Congenital insufficiency of aortic valve: Secondary | ICD-10-CM

## 2018-01-31 LAB — HEMOGLOBIN A1C
HEMOGLOBIN A1C: 5.1 % (ref 4.8–5.6)
Mean Plasma Glucose: 99.67 mg/dL

## 2018-01-31 LAB — LIPID PANEL
Cholesterol: 214 mg/dL — ABNORMAL HIGH (ref 0–200)
HDL: 45 mg/dL (ref >40)
LDL Cholesterol: 152 mg/dL — ABNORMAL HIGH (ref 0–99)
Total CHOL/HDL Ratio: 4.8 RATIO
Triglycerides: 84 mg/dL (ref <150)
VLDL: 17 mg/dL (ref 0–40)

## 2018-01-31 MED ORDER — ATORVASTATIN CALCIUM 10 MG PO TABS
20.0000 mg | ORAL_TABLET | Freq: Every day | ORAL | Status: DC
  Administered 2018-01-31 – 2018-02-02 (×3): 20 mg via ORAL
  Filled 2018-01-31 (×4): qty 2

## 2018-01-31 NOTE — Progress Notes (Signed)
Rehab Admissions Coordinator Note:  Patient was screened by Cleatrice Burke for appropriateness for an Inpatient Acute Rehab Consult per PT recs.  At this time, we are recommending Inpatient Rehab consult. Please place order for consult.  Cleatrice Burke RN MSN 01/31/2018, 8:02 PM  I can be reached at [phone].

## 2018-01-31 NOTE — Consult Note (Signed)
[place] [facility] for Infectious Disease    Date of Admission:  01/30/2018   Total days of antibiotics 16              Reason for Consult: Possible CNS embolic strokes following recent pneumococcal meningitis    Referring Provider: Dr. Cordelia Poche  Assessment: Unfortunately she has developed complete sensorineural hearing loss that is likely related to her recent pneumococcal meningitis.  I am not surprised that she still has some residual leptomeningeal enhancement and small foci of cerebritis but I do not think that that means that her meningitis persists.  However I share the concern that she might have developed embolic strokes secondary to endocarditis that was missed with her initial TEE.  Her pneumococcal isolate had intermediate resistance to ceftriaxone so I will continue the current antibiotic therapy pending final culture results and repeat TEE.  Plan: 1. Continue current antibiotics 2. Await results of repeat cultures and TEE  Principal Problem:   Acute ischemic stroke [facility]) Active Problems:   Pneumococcal bacteremia   Pneumococcal meningitis   Sensorineural hearing loss (SNHL) of both ears   Bicuspid aortic valve   HTN (hypertension)   Hypothyroidism   Generalized anxiety disorder   Turner syndrome   Dyslipidemia   Scheduled Meds: .  stroke: mapping our early stages of recovery book   Does not apply Once  . FLUoxetine  40 mg Oral Daily  . fluticasone  2 spray Each Nare Daily  . levothyroxine  75 mcg Oral QAC breakfast   Continuous Infusions: . cefTRIAXone (ROCEPHIN)  IV 2 g (01/31/18 0914)  . vancomycin 1,000 mg (01/31/18 1125)   PRN Meds:.acetaminophen **OR** acetaminophen (TYLENOL) oral liquid 160 mg/5 mL **OR** acetaminophen, senna-docusate  HPI: Emma Martin is a 38 y.o. female with Turner syndrome who developed severe headache and decreased hearing right after Christmas leading to admission to the hospital.  She was found to have pneumococcal  bacteremia and meningitis.  She was treated with vancomycin, ceftriaxone and 4 days of dexamethasone.  She noted some improvement in her hearing while she was on dexamethasone and was doing better at the time of discharge on 01/19/2018.  On 01/25/2018 she had sudden onset of complete hearing loss.  She was seen back in the emergency department and referred for ENT evaluation the following day.  She was seen by Dr. Jodi Marble who felt that she had bilateral sensorineural hearing loss secondary to her meningitis.  She is being referred for evaluation of cochlear implants at [facility].  Yesterday morning, when her father went to wake her up at 8 AM she was extremely lethargic.  He was able to get her up after about 10 minutes and noted that she was weak on her left side.  EMS was called and she was brought here for readmission.  Brain MRI revealed scattered leptomeningeal enhancement and small foci of cerebritis with scattered multifocal acute and subacute ischemic infarcts.  She had improvement in her left-sided weakness over the course of yesterday.  She has a history of bicuspid aortic valve.  No vegetations were noted on TEE during her last admission.   Review of Systems: Review of Systems  Constitutional: Negative for chills, diaphoresis and fever.  HENT: Positive for hearing loss.   Neurological: Positive for focal weakness and headaches.    Past Medical History:  Diagnosis Date  . Bicuspid aortic valve    , mild aortic insufficiency. (  No SBE prophylaxis needed)  . Complication of anesthesia   . Dyslipidemia   . Generalized anxiety disorder    , with obsessive-compulsive traits.  . Hypertension   . Hypothyroidism   . Mixed gonadal dysgenesis    , (additional Y-bearing cell line) for which she reportedly underwent bilateral gonadectomy (patient unaware; needs confirmation) due to malignancy potential.  . PONV (postoperative nausea and vomiting)   .  Primary amenorrhea    , treated starting at age 64 or 14 with estrogen and progesterone  . Scoliosis    , treated with Harrington rods (1997) for stabilization.  . Seasonal allergic rhinitis   . Short stature    , previously treated with growth hormone age 74-15 years.  Radford Pax syndrome    , previously followed by pediatric endocrinologist Freddy Jaksch, MD at [facility] through her early 44s).    Social History   Tobacco Use  . Smoking status: Never Smoker  . Smokeless tobacco: Never Used  Substance Use Topics  . Alcohol use: Yes    Alcohol/week: 1.0 - 2.0 standard drinks    Types: 1 - 2 Glasses of wine per week    Comment: Occasionally  . Drug use: No    Family History  Problem Relation Age of Onset  . Other Father        A + W  . Cancer Maternal Grandmother   . Cancer Paternal Grandfather   . Hypertension Mother   . Other Mother        Dyslipidemia  . Hypothyroidism Mother   . Mitral valve prolapse Mother    Allergies  Allergen Reactions  . Benzoin Rash    OBJECTIVE: Blood pressure (!) 149/90, pulse 95, temperature 98.5 F (36.9 C), temperature source Oral, resp. rate 15, weight 49.7 kg, SpO2 98 %.  Physical Exam Constitutional:      Comments: She is alert and in no distress resting quietly in bed.  Her father is at the bedside.  HENT:     Head:     Comments: She is completely deaf.    Mouth/Throat:     Pharynx: No oropharyngeal exudate.  Eyes:     Conjunctiva/sclera: Conjunctivae normal.  Neck:     Musculoskeletal: Neck supple.  Cardiovascular:     Rate and Rhythm: Normal rate and regular rhythm.     Heart sounds: No murmur.  Pulmonary:     Effort: Pulmonary effort is normal.     Breath sounds: Normal breath sounds.  Abdominal:     Palpations: Abdomen is soft.     Tenderness: There is no abdominal tenderness.  Skin:    Comments: No splinter hemorrhages.  Neurological:     Comments: She is able to lift her left arm and leg against  gravity.  She is still slightly weak on her left side but her father states that this is much improved.     Lab Results Lab Results  Component Value Date   WBC 10.4 01/30/2018   HGB 13.3 01/30/2018   HCT 39.0 01/30/2018   MCV 88.0 01/30/2018   PLT 327 01/30/2018    Lab Results  Component Value Date   CREATININE 0.20 (L) 01/30/2018   BUN 9 01/30/2018   NA 136 01/30/2018   K 3.6 01/30/2018   CL 98 01/30/2018   CO2 26 01/30/2018    Lab Results  Component Value Date   ALT 386 (H) 01/30/2018   AST 184 (H) 01/30/2018   ALKPHOS 216 (H)  01/30/2018   BILITOT 0.9 01/30/2018     Microbiology: Recent Results (from the past 240 hour(s))  MRSA PCR Screening     Status: None   Collection Time: 01/30/18  6:02 PM  Result Value Ref Range Status   MRSA by PCR NEGATIVE NEGATIVE Final    Comment:        The GeneXpert MRSA Assay (FDA approved for NASAL specimens only), is one component of a comprehensive MRSA colonization surveillance program. It is not intended to diagnose MRSA infection nor to guide or monitor treatment for MRSA infections. Performed at [facility], [place] [address], [place], [place] [address]     Michel Bickers, [facility] [place] [phone] pager   [phone] cell 01/31/2018, 3:46 PM

## 2018-01-31 NOTE — Progress Notes (Signed)
PROGRESS NOTE    Emma Martin  JOA:[id number] DOB: July 30, 1980 DOA: 01/30/2018 PCP: Maurice Small, MD   Brief Narrative: Emma Martin is a 38 y.o. female with a history of hypertension, hypothyroidism, Turner syndrome, scoliosis status post Harrington rods, bicuspid aortic valve, streptococcal meningitis and bacteremia. 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   Assessment & Plan:   Principal Problem:   Acute ischemic stroke [facility]) Active Problems:   Bicuspid aortic valve   HTN (hypertension)   Hypothyroidism   Generalized anxiety disorder   Pneumococcal bacteremia   Pneumococcal meningitis   Turner syndrome   Sensorineural hearing loss (SNHL) of both ears   Dyslipidemia   Acute ischemic stroke Likely embolic. Concern for septic emboli in setting of recent strep pneumo bacteremia and meningitis.  Patient has a recent TEE without evidence of vegetations.  Blood cultures prior to discharge were no growth. -Neurology recommendations: Transesophageal Echocardiogram -ID consult -Continue vancomycin for now  History of streptococcal pneumonia bacteremia/meningitis Treated with 2 weeks of vancomycin and ceftriaxone.  Work-up as mentioned above.  Completed IV course on January 10.  Hearing loss Complete hearing loss secondary to history of streptococcal pneumonia meningitis.  Patient has outpatient plans for [facility] ENT follow-up  Essential hypertension Patient is on lisinopril an outpatient.  Currently not resumed in setting of permissive hypertension.  Hypothyroidism -Continue Synthroid  Mood disorder -Continue Prozac   DVT prophylaxis: SCDs Code Status:   Code Status: Full Code Family Communication: Father at bedside Disposition Plan: Discharge pending stroke work-up and possible ID work-up   Consultants:   Neurology  Infectious  disease  Procedures:   None  Antimicrobials:  Vancomycin    Subjective: No current concerns  Objective: Vitals:   01/31/18 0029 01/31/18 0411 01/31/18 0817 01/31/18 1314  BP: (!) 152/93 (!) 151/94 (!) 149/95 (!) 149/90  Pulse: 87 90 94 95  Resp: 19 17 16 15   Temp: 98.4 F (36.9 C) 98.2 F (36.8 C) 97.8 F (36.6 C) 98.5 F (36.9 C)  TempSrc: Oral Oral Oral Oral  SpO2: 96% 100% 96% 98%  Weight:        Intake/Output Summary (Last 24 hours) at 01/31/2018 1601 Last data filed at 01/31/2018 1313 Gross per 24 hour  Intake 800 ml  Output 2000 ml  Net -1200 ml   Filed Weights   01/30/18 1018  Weight: 49.7 kg    Examination:  General exam: Appears calm and comfortable Respiratory system: Clear to auscultation. Respiratory effort normal. Cardiovascular system: S1 & S2 heard, RRR. No murmurs, rubs, gallops or clicks. Gastrointestinal system: Abdomen is nondistended, soft and nontender. No organomegaly or masses felt. Normal bowel sounds heard. Central nervous system: Alert and oriented. No focal neurological deficits.  5/5 upper extremity strength, 4 out of 5 lower extremity strength Extremities: No edema. No calf tenderness Skin: No cyanosis. No rashes Psychiatry: Judgement and insight appear normal. Mood & affect appropriate.     Data Reviewed: I have personally reviewed following labs and imaging studies  CBC: Recent Labs  Lab 01/30/18 0927 01/30/18 0934  WBC 10.4  --   NEUTROABS 8.5*  --   HGB 12.4 13.3  HCT 38.8 39.0  MCV 88.0  --   PLT 327  --    Basic Metabolic Panel: Recent Labs  Lab 01/30/18 0927 01/30/18 0934  NA 136 136  K 3.5 3.6  CL 100 98  CO2 26  --   GLUCOSE 97 89  BUN 7 9  CREATININE 0.43* 0.20*  CALCIUM 8.8*  --    GFR: Estimated Creatinine Clearance: 73.4 mL/min (A) (by C-G formula based on SCr of 0.2 mg/dL (L)). Liver Function Tests: Recent Labs  Lab 01/30/18 0927  AST 184*  ALT 386*  ALKPHOS 216*  BILITOT 0.9  PROT  6.8  ALBUMIN 2.9*   No results for input(s): LIPASE, AMYLASE in the last 168 hours. No results for input(s): AMMONIA in the last 168 hours. Coagulation Profile: Recent Labs  Lab 01/30/18 0927  INR 1.11   Cardiac Enzymes: No results for input(s): CKTOTAL, CKMB, CKMBINDEX, TROPONINI in the last 168 hours. BNP (last 3 results) No results for input(s): PROBNP in the last 8760 hours. HbA1C: Recent Labs    01/31/18 0701  HGBA1C 5.1   CBG: Recent Labs  Lab 01/30/18 0928  GLUCAP 76   Lipid Profile: Recent Labs    01/31/18 0701  CHOL 214*  HDL 45  LDLCALC 152*  TRIG 84  CHOLHDL 4.8   Thyroid Function Tests: No results for input(s): TSH, T4TOTAL, FREET4, T3FREE, THYROIDAB in the last 72 hours. Anemia Panel: No results for input(s): VITAMINB12, FOLATE, FERRITIN, TIBC, IRON, RETICCTPCT in the last 72 hours. Sepsis Labs: No results for input(s): PROCALCITON, LATICACIDVEN in the last 168 hours.  Recent Results (from the past 240 hour(s))  MRSA PCR Screening     Status: None   Collection Time: 01/30/18  6:02 PM  Result Value Ref Range Status   MRSA by PCR NEGATIVE NEGATIVE Final    Comment:        The GeneXpert MRSA Assay (FDA approved for NASAL specimens only), is one component of a comprehensive MRSA colonization surveillance program. It is not intended to diagnose MRSA infection nor to guide or monitor treatment for MRSA infections. Performed at [facility], [place] [address], [place], [place] [address]          Radiology Studies: Ct Angio Head W Or Wo Contrast  Result Date: 01/30/2018 CLINICAL DATA:  Acute onset of left-sided weakness. EXAM: CT ANGIOGRAPHY HEAD AND NECK CT PERFUSION BRAIN TECHNIQUE: Multidetector CT imaging of the head and neck was performed using the standard protocol during bolus administration of intravenous contrast. Multiplanar CT image reconstructions and MIPs were obtained to evaluate the vascular anatomy. Carotid stenosis  measurements (when applicable) are obtained utilizing NASCET criteria, using the distal internal carotid diameter as the denominator. Multiphase CT imaging of the brain was performed following IV bolus contrast injection. Subsequent parametric perfusion maps were calculated using RAPID software. CONTRAST:  120mL ISOVUE-370 IOPAMIDOL (ISOVUE-370) INJECTION 76% COMPARISON:  Head CT without contrast 01/30/2018 and 01/26/2018. FINDINGS: CTA NECK FINDINGS Aortic arch: A 3 vessel arch configuration is present. No significant atherosclerotic calcifications or stenosis is present. Right carotid system: The right common carotid artery is within normal limits. The bifurcation is unremarkable. Cervical right ICA is normal Left carotid system: The left common carotid artery is within normal limits. Left bifurcation is unremarkable. There is some tortuosity of the distal cervical left ICA without significant stenosis. Vertebral arteries: The right vertebral artery is dominant. Left vertebral artery is hypoplastic. Both vertebral arteries originate from the subclavian arteries without significant stenosis. There is no significant stenosis of either vertebral artery in the neck. Skeleton: Leftward curvature is present in the upper thoracic spine. There is mild rightward compensating curvature of the cervical spine. AP alignment is anatomic. There straightening of  the normal cervical lordosis. Spinal fusion begins at T1. Other neck: The soft tissues of the neck are otherwise unremarkable. No focal mucosal lesions are present. Subcentimeter thyroid nodules are noted. No significant adenopathy is present. Salivary glands are within normal limits. Upper chest: Lung apices are clear. Review of the MIP images confirms the above findings CTA HEAD FINDINGS Anterior circulation: The internal carotid arteries are within normal limits from the skull base through the ICA termini. There is mild irregularity of the M1 segments bilaterally  without a significant stenosis or occlusion. MCA bifurcations are intact bilaterally. Mild diffuse small vessel disease is advanced for age. ACA branch vessels are intact. Posterior circulation: The right vertebral artery is dominant. PICA origins are visualized and normal. Vertebrobasilar junction is. Basilar artery is small, essentially terminating at the superior cerebellar arteries. There is a small right P1 segment. Posterior communicating arteries are present bilaterally. Venous sinuses: Dural sinuses are patent. Straight sinus deep cerebral pains are intact. Cortical veins are unremarkable. Anatomic variants: Fetal type left posterior cerebral artery and dominant right posterior communicating artery. Review of the MIP images confirms the above findings CT Brain Perfusion Findings: CBF (<30%) Volume: 4mL Perfusion (Tmax>6.0s) volume: 43mL Mismatch Volume: 14mL Infarction Location:N/a IMPRESSION: 1. No focal ischemia or tissue at risk.  Normal CT brain perfusion. 2. No emergent large vessel occlusion. 3. Medium and small vessel irregularity is noted within the anterior circulation. Vessels are small. Question underlying vasculitis. 4. Tortuosity of the distal cervical left ICA. There is no significant stenosis. This is commonly seen in the setting of hypertension. 5. Scoliosis. Electronically Signed   By: San Morelle M.D.   On: 01/30/2018 10:22   Ct Angio Neck W Or Wo Contrast  Result Date: 01/30/2018 CLINICAL DATA:  Acute onset of left-sided weakness. EXAM: CT ANGIOGRAPHY HEAD AND NECK CT PERFUSION BRAIN TECHNIQUE: Multidetector CT imaging of the head and neck was performed using the standard protocol during bolus administration of intravenous contrast. Multiplanar CT image reconstructions and MIPs were obtained to evaluate the vascular anatomy. Carotid stenosis measurements (when applicable) are obtained utilizing NASCET criteria, using the distal internal carotid diameter as the denominator.  Multiphase CT imaging of the brain was performed following IV bolus contrast injection. Subsequent parametric perfusion maps were calculated using RAPID software. CONTRAST:  166mL ISOVUE-370 IOPAMIDOL (ISOVUE-370) INJECTION 76% COMPARISON:  Head CT without contrast 01/30/2018 and 01/26/2018. FINDINGS: CTA NECK FINDINGS Aortic arch: A 3 vessel arch configuration is present. No significant atherosclerotic calcifications or stenosis is present. Right carotid system: The right common carotid artery is within normal limits. The bifurcation is unremarkable. Cervical right ICA is normal Left carotid system: The left common carotid artery is within normal limits. Left bifurcation is unremarkable. There is some tortuosity of the distal cervical left ICA without significant stenosis. Vertebral arteries: The right vertebral artery is dominant. Left vertebral artery is hypoplastic. Both vertebral arteries originate from the subclavian arteries without significant stenosis. There is no significant stenosis of either vertebral artery in the neck. Skeleton: Leftward curvature is present in the upper thoracic spine. There is mild rightward compensating curvature of the cervical spine. AP alignment is anatomic. There straightening of the normal cervical lordosis. Spinal fusion begins at T1. Other neck: The soft tissues of the neck are otherwise unremarkable. No focal mucosal lesions are present. Subcentimeter thyroid nodules are noted. No significant adenopathy is present. Salivary glands are within normal limits. Upper chest: Lung apices are clear. Review of the MIP images confirms the above  findings CTA HEAD FINDINGS Anterior circulation: The internal carotid arteries are within normal limits from the skull base through the ICA termini. There is mild irregularity of the M1 segments bilaterally without a significant stenosis or occlusion. MCA bifurcations are intact bilaterally. Mild diffuse small vessel disease is advanced for  age. ACA branch vessels are intact. Posterior circulation: The right vertebral artery is dominant. PICA origins are visualized and normal. Vertebrobasilar junction is. Basilar artery is small, essentially terminating at the superior cerebellar arteries. There is a small right P1 segment. Posterior communicating arteries are present bilaterally. Venous sinuses: Dural sinuses are patent. Straight sinus deep cerebral pains are intact. Cortical veins are unremarkable. Anatomic variants: Fetal type left posterior cerebral artery and dominant right posterior communicating artery. Review of the MIP images confirms the above findings CT Brain Perfusion Findings: CBF (<30%) Volume: 51mL Perfusion (Tmax>6.0s) volume: 63mL Mismatch Volume: 32mL Infarction Location:N/a IMPRESSION: 1. No focal ischemia or tissue at risk.  Normal CT brain perfusion. 2. No emergent large vessel occlusion. 3. Medium and small vessel irregularity is noted within the anterior circulation. Vessels are small. Question underlying vasculitis. 4. Tortuosity of the distal cervical left ICA. There is no significant stenosis. This is commonly seen in the setting of hypertension. 5. Scoliosis. Electronically Signed   By: San Morelle M.D.   On: 01/30/2018 10:22   Mr Brain Wo Contrast  Result Date: 01/30/2018 CLINICAL DATA:  Follow-up examination for acute stroke, left-sided weakness. Recent history of bacteremia with meningitis. EXAM: MRI HEAD WITHOUT CONTRAST TECHNIQUE: Multiplanar, multiecho pulse sequences of the brain and surrounding structures were obtained without intravenous contrast. COMPARISON:  Prior CT and CTA from earlier the same day. FINDINGS: Brain: Cerebral volume within normal limits for age. Few scattered areas of diffusion abnormality seen involving the bilateral cerebral hemispheres, suspicious for acute to early subacute ischemic infarcts. These involve the subcortical and deep white matter of the posterior right frontal region  (series 5, image 90, 88). Involvement of the subcortical left frontal white matter (series 5, image 86). Associated small amount of susceptibility artifact at this location consistent with petechial hemorrhage (series 12, image 36). 14 mm focus of diffusion at the right thalamus/genu of the right internal capsule (series 5, image 81). Patchy infarcts also seen involving the peripheral right cerebellum near the brachium pontis (series 5, image 68) as well as the cerebellar vermis (series 5, image 70). Additional diffusion abnormality seen overlying the surface of the high right cerebral convexity (series 5, image 97, 94). Additional diffusion abnormality present at the suprasellar cistern (series 5, image 77). Probable mild signal abnormality surrounding the midbrain and perimesencephalic cistern as well. Constellation of findings favored to reflect sequelae of meningitis with associated pial exudate and multifocal infarcts. No other associated hemorrhage. Few additional scattered punctate micro hemorrhages seen scattered within the brain, likely related to history of hypertension. No frank intracerebral abscess or empyema identified on this noncontrast examination. No mass lesion, midline shift, or significant mass effect. Ventricles normal size without hydrocephalus. Pituitary gland suprasellar region normal. Midline structures intact and normal. Vascular: Major intracranial vascular flow voids maintained. Skull and upper cervical spine: Craniocervical junction within normal limits. Visualized upper cervical spine unremarkable. No focal marrow replacing lesion. Scalp soft tissues demonstrate no acute finding. Sinuses/Orbits: Globes and orbital soft tissues within normal limits. Left sphenoid sinus completely opacified. Scattered mucosal thickening throughout the remaining paranasal sinuses. Small right mastoid effusion noted. Inner ear structures within normal limits. Other: None. IMPRESSION: Scattered diffusion  abnormality  overlying the right cerebral convexity as well as the suprasellar cistern, most concerning for pial exudate related to meningitis given provided history. Associated scattered multifocal acute to early subacute ischemic infarcts as detailed above. Specific note made of diffusion abnormality at the ventral right thalamus/internal capsule, which could account for patient's left-sided symptoms. Correlation with CSF analysis suggested. Electronically Signed   By: Jeannine Boga M.D.   On: 01/30/2018 14:41   Mr Brain W Contrast  Result Date: 01/30/2018 CLINICAL DATA:  Initial evaluation for bacterial meningitis. EXAM: MRI HEAD WITH CONTRAST TECHNIQUE: Multiplanar, multiecho pulse sequences of the brain and surrounding structures were obtained with intravenous contrast. CONTRAST:  4 cc of Gadavist. COMPARISON:  Prior noncontrast MRI from earlier the same day. FINDINGS: Postcontrast imaging demonstrates scattered left meningeal and dural enhancement overlying the right cerebral convexity, corresponding with previously seen diffusion abnormality, compatible with meningitis. There are a few scattered small areas of focal cortical enhancement within the underlying right frontal and parietal lobes (series 6, images 49, 50), consistent with associated cerebritis. Similar leptomeningeal enhancement noted about the perimesencephalic cistern and midbrain, extending into the suprasellar cistern. Patchy enhancement within the cerebellar vermis consistent with associated cerebritis (series 6, images 25, 29). Additionally, there are scattered multifocal foci of enhancement seen involving the subcortical and deep white matter of both cerebral hemispheres, some of which correspond with previously seen infarcts. These are likely perivascular in nature, and also compatible with meningitis (series 7, images 14, 20). No frank intracerebral abscess or empyema. No significant ependymal enhancement to suggest  ventriculitis. No hydrocephalus. No findings to suggest venous thrombosis. No significant mass effect or midline shift. IMPRESSION: Scattered leptomeningeal enhancement overlying the right cerebral convexity and about the perimesencephalic and supra cerebellar cisterns, most consistent with meningitis. Associated patchy post-contrast enhancement within the bilateral cerebral hemispheres and cerebellum as above, compatible with associated cerebritis. No frank intracerebral abscess or empyema at this time. Electronically Signed   By: Jeannine Boga M.D.   On: 01/30/2018 15:55   Ct Cerebral Perfusion W Contrast  Result Date: 01/30/2018 CLINICAL DATA:  Acute onset of left-sided weakness. EXAM: CT ANGIOGRAPHY HEAD AND NECK CT PERFUSION BRAIN TECHNIQUE: Multidetector CT imaging of the head and neck was performed using the standard protocol during bolus administration of intravenous contrast. Multiplanar CT image reconstructions and MIPs were obtained to evaluate the vascular anatomy. Carotid stenosis measurements (when applicable) are obtained utilizing NASCET criteria, using the distal internal carotid diameter as the denominator. Multiphase CT imaging of the brain was performed following IV bolus contrast injection. Subsequent parametric perfusion maps were calculated using RAPID software. CONTRAST:  156mL ISOVUE-370 IOPAMIDOL (ISOVUE-370) INJECTION 76% COMPARISON:  Head CT without contrast 01/30/2018 and 01/26/2018. FINDINGS: CTA NECK FINDINGS Aortic arch: A 3 vessel arch configuration is present. No significant atherosclerotic calcifications or stenosis is present. Right carotid system: The right common carotid artery is within normal limits. The bifurcation is unremarkable. Cervical right ICA is normal Left carotid system: The left common carotid artery is within normal limits. Left bifurcation is unremarkable. There is some tortuosity of the distal cervical left ICA without significant stenosis. Vertebral  arteries: The right vertebral artery is dominant. Left vertebral artery is hypoplastic. Both vertebral arteries originate from the subclavian arteries without significant stenosis. There is no significant stenosis of either vertebral artery in the neck. Skeleton: Leftward curvature is present in the upper thoracic spine. There is mild rightward compensating curvature of the cervical spine. AP alignment is anatomic. There straightening of the normal  cervical lordosis. Spinal fusion begins at T1. Other neck: The soft tissues of the neck are otherwise unremarkable. No focal mucosal lesions are present. Subcentimeter thyroid nodules are noted. No significant adenopathy is present. Salivary glands are within normal limits. Upper chest: Lung apices are clear. Review of the MIP images confirms the above findings CTA HEAD FINDINGS Anterior circulation: The internal carotid arteries are within normal limits from the skull base through the ICA termini. There is mild irregularity of the M1 segments bilaterally without a significant stenosis or occlusion. MCA bifurcations are intact bilaterally. Mild diffuse small vessel disease is advanced for age. ACA branch vessels are intact. Posterior circulation: The right vertebral artery is dominant. PICA origins are visualized and normal. Vertebrobasilar junction is. Basilar artery is small, essentially terminating at the superior cerebellar arteries. There is a small right P1 segment. Posterior communicating arteries are present bilaterally. Venous sinuses: Dural sinuses are patent. Straight sinus deep cerebral pains are intact. Cortical veins are unremarkable. Anatomic variants: Fetal type left posterior cerebral artery and dominant right posterior communicating artery. Review of the MIP images confirms the above findings CT Brain Perfusion Findings: CBF (<30%) Volume: 42mL Perfusion (Tmax>6.0s) volume: 66mL Mismatch Volume: 64mL Infarction Location:N/a IMPRESSION: 1. No focal ischemia  or tissue at risk.  Normal CT brain perfusion. 2. No emergent large vessel occlusion. 3. Medium and small vessel irregularity is noted within the anterior circulation. Vessels are small. Question underlying vasculitis. 4. Tortuosity of the distal cervical left ICA. There is no significant stenosis. This is commonly seen in the setting of hypertension. 5. Scoliosis. Electronically Signed   By: San Morelle M.D.   On: 01/30/2018 10:22   Ct Head Code Stroke Wo Contrast  Result Date: 01/30/2018 CLINICAL DATA:  Code stroke. Acute onset of left-sided weakness beginning 2 hours ago. EXAM: CT HEAD WITHOUT CONTRAST TECHNIQUE: Contiguous axial images were obtained from the base of the skull through the vertex without intravenous contrast. COMPARISON:  None. FINDINGS: Brain: No acute infarct, hemorrhage, or mass lesion is present. No significant white matter lesions are present. Basal ganglia and insular ribbon are normal bilaterally. No acute or focal cortical abnormality is present. The brainstem and cerebellum are within normal limits. Vascular: No hyperdense vessel or unexpected calcification. Skull: Calvarium is intact. No focal lytic or blastic lesions are present. Sinuses/Orbits: Previous facial surgery is again noted. A posterior right ethmoid air cell and the left sphenoid sinus are chronically opacified. Mastoid air cells are patent. The globes and orbits are within normal limits. ASPECTS [facility] Stroke Program Early CT Score) - Ganglionic level infarction (caudate, lentiform nuclei, internal capsule, insula, M1-M3 cortex): 7/7 - Supraganglionic infarction (M4-M6 cortex): 3/3 Total score (0-10 with 10 being normal): 10/10 IMPRESSION: 1. Normal CT appearance of the brain. 2. Postsurgical changes of the anterior maxillary sinuses and nasal bones. 3. Stable chronic sinus opacification. 4. ASPECTS is 10/10 Electronically Signed   By: San Morelle M.D.   On: 01/30/2018 09:51        Scheduled  Meds: .  stroke: mapping our early stages of recovery book   Does not apply Once  . FLUoxetine  40 mg Oral Daily  . fluticasone  2 spray Each Nare Daily  . levothyroxine  75 mcg Oral QAC breakfast   Continuous Infusions: . cefTRIAXone (ROCEPHIN)  IV 2 g (01/31/18 0914)  . vancomycin 1,000 mg (01/31/18 1125)     LOS: 1 day     Cordelia Poche, MD [facility] 01/31/2018, 4:01 PM  If 7PM-7AM, please contact night-coverage [email or web address]

## 2018-01-31 NOTE — Evaluation (Signed)
Physical Therapy Evaluation Patient Details Name: Emma Martin MRN: [id number] DOB: [birth year] Today's Date: 01/31/2018   History of Present Illness  Emma Martin is a 38 y.o. female with a history of hypertension, hypothyroidism, Turner syndrome, scoliosis status post Harrington rods, bicuspid aortic valve, streptococcal meningitis and bacteremia. 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    Clinical Impression  Pt admitted with above diagnosis. Pt currently with functional limitations due to the deficits listed below (see PT Problem List). PTA patient home from recent hospitalization working with [facility], ambulating with RW, prior to that independent with all mobility. Patient lives with father who is avail 24/7. Today patient with improving L sided weakness deficits, coordination, balance deficits, possible cognitive and visual deficits that will require further assessment next therapy session. Today patient ambulating short distances with hands on assistance for correction and stabilization. Pt eager to return to PLOF.  Pt will benefit from skilled PT to increase their independence and safety with mobility to allow discharge to the venue listed below.    HR 90-110 with activity, SpO2 WNL on RA     Follow Up Recommendations CIR;Supervision for mobility/OOB    Equipment Recommendations  (TBD)    Recommendations for Other Services Rehab consult;OT consult     Precautions / Restrictions Precautions Precautions: Fall Restrictions Weight Bearing Restrictions: No      Mobility  Bed Mobility Overal bed mobility: Needs Assistance Bed Mobility: Rolling;Sidelying to Sit;Sit to Supine Rolling: Supervision Sidelying to sit: Supervision          Transfers Overall transfer level: Needs assistance Equipment used: Rolling walker (2 wheeled);Quad cane Transfers: Sit  to/from Stand Sit to Stand: Min guard;Min assist         General transfer comment: Pt able to stand with min A to provide stability.   Ambulation/Gait Ambulation/Gait assistance: Min assist Gait Distance (Feet): 60 Feet(requiring intervention every 5-10' ) Assistive device: Rolling walker (2 wheeled) Gait Pattern/deviations: Staggering left;Step-to pattern;Drifts right/left Gait velocity: decr   General Gait Details: Pt with step to pattern, L path deviation and poor obstical avoidance. at times min A to mod A to correct and stabilize with RW   Stairs            Wheelchair Mobility    Modified Rankin (Stroke Patients Only) Modified Rankin (Stroke Patients Only) Pre-Morbid Rankin Score: No significant disability Modified Rankin: Moderately severe disability     Balance     Sitting balance-Leahy Scale: Poor       Standing balance-Leahy Scale: Poor                               Pertinent Vitals/Pain Pain Assessment: No/denies pain    Home Living Family/patient expects to be discharged to:: Private residence Living Arrangements: Parent Available Help at Discharge: Family;Available 24 hours/day Type of Home: House Home Access: Stairs to enter Entrance Stairs-Rails: Psychiatric nurse of Steps: 2 Home Layout: Two level;Bed/bath upstairs Home Equipment: Cane - quad;Other (comment)      Prior Function Level of Independence: Independent               Hand Dominance        Extremity/Trunk Assessment   Upper Extremity Assessment Upper Extremity Assessment: Defer to OT evaluation(RUE WNL, LUE grossly 4-/5, can lift overhead tonight. )    Lower Extremity  Assessment Lower Extremity Assessment: (grossly 4-/5, intact sensitation. )       Communication      Cognition Arousal/Alertness: Awake/alert Behavior During Therapy: Flat affect Overall Cognitive Status: Within Functional Limits for tasks assessed                                  General Comments: pt AO4, exhibiting potential poor insights into deficits, understating weakness and balance deficits, communication tonight through voice to text, pt reading then responding. able to follow all cues. woudl benefit from further cognitive assessment       General Comments General comments (skin integrity, edema, etc.): Discussion with father over role of PT     Exercises     Assessment/Plan    PT Assessment Patient needs continued PT services  PT Problem List Decreased strength;Decreased activity tolerance;Decreased knowledge of use of DME;Decreased balance;Decreased mobility       PT Treatment Interventions DME instruction;Stair training;Gait training;Functional mobility training;Therapeutic activities;Therapeutic exercise;Balance training;Neuromuscular re-education    PT Goals (Current goals can be found in the Care Plan section)  Acute Rehab PT Goals Patient Stated Goal: return to PLOF PT Goal Formulation: With patient Time For Goal Achievement: 02/14/18 Potential to Achieve Goals: Good    Frequency Min 4X/week   Barriers to discharge        Co-evaluation               AM-PAC PT "6 Clicks" Mobility  Outcome Measure Help needed turning from your back to your side while in a flat bed without using bedrails?: None Help needed moving from lying on your back to sitting on the side of a flat bed without using bedrails?: A Little Help needed moving to and from a bed to a chair (including a wheelchair)?: A Little Help needed standing up from a chair using your arms (e.g., wheelchair or bedside chair)?: A Little Help needed to walk in hospital room?: A Lot Help needed climbing 3-5 steps with a railing? : A Lot 6 Click Score: 17    End of Session Equipment Utilized During Treatment: Gait belt Activity Tolerance: Patient tolerated treatment well;Patient limited by fatigue;Other (comment) Patient left: with call bell/phone  within reach;with family/visitor present;in bed;with bed alarm set;with SCD's reapplied Nurse Communication: Mobility status PT Visit Diagnosis: Other abnormalities of gait and mobility (R26.89);Difficulty in walking, not elsewhere classified (R26.2)    Time: 1730-1830 PT Time Calculation (min) (ACUTE ONLY): 60 min   Charges:   PT Evaluation $PT Eval High Complexity: 1 High PT Treatments $Gait Training: 8-22 mins $Neuromuscular Re-education: 8-22 mins       Reinaldo Berber, PT, DPT Acute Rehabilitation Services Pager: [phone] Office: [phone]    Reinaldo Berber 01/31/2018, 7:16 PM

## 2018-01-31 NOTE — Progress Notes (Signed)
STROKE TEAM PROGRESS NOTE   SUBJECTIVE (INTERVAL HISTORY) Her father is at the bedside and her sister is on the speaker phone.  Patient lying in bed, not in distress.  Able to greet me when I came in, however still has total hearing loss bilaterally.  Moving all extremities symmetrically but still has left upper extremity mild ataxia.  Weakness much improved from yesterday.  MRI with and without contrast showed leptomeningeal enhancement and small focus of cerebritis as well as embolic strokes, concerning for endocarditis and persistent meningitis or from meningitis.  Recommend TEE and ID consult.  OBJECTIVE Vitals:   01/30/18 1946 01/31/18 0029 01/31/18 0411 01/31/18 0817  BP: (!) 143/90 (!) 152/93 (!) 151/94 (!) 149/95  Pulse: (!) 102 87 90 94  Resp: 18 19 17 16   Temp: 99.2 F (37.3 C) 98.4 F (36.9 C) 98.2 F (36.8 C) 97.8 F (36.6 C)  TempSrc: Oral Oral Oral Oral  SpO2: 100% 96% 100% 96%  Weight:        CBC:  Recent Labs  Lab 01/30/18 0927 01/30/18 0934  WBC 10.4  --   NEUTROABS 8.5*  --   HGB 12.4 13.3  HCT 38.8 39.0  MCV 88.0  --   PLT 327  --     Basic Metabolic Panel:  Recent Labs  Lab 01/30/18 0927 01/30/18 0934  NA 136 136  K 3.5 3.6  CL 100 98  CO2 26  --   GLUCOSE 97 89  BUN 7 9  CREATININE 0.43* 0.20*  CALCIUM 8.8*  --     Lipid Panel:     Component Value Date/Time   CHOL 214 (H) 01/31/2018 0701   TRIG 84 01/31/2018 0701   HDL 45 01/31/2018 0701   CHOLHDL 4.8 01/31/2018 0701   VLDL 17 01/31/2018 0701   LDLCALC 152 (H) 01/31/2018 0701   HgbA1c:  Lab Results  Component Value Date   HGBA1C 5.1 01/31/2018   Urine Drug Screen: No results found for: LABOPIA, COCAINSCRNUR, LABBENZ, AMPHETMU, THCU, LABBARB  Alcohol Level No results found for: ETH  IMAGING   Ct Angio Head W Or Wo Contrast Ct Angio Neck W Or Wo Contrast Ct Cerebral Perfusion W Contrast 01/30/2018 IMPRESSION:  1. No focal ischemia or tissue at risk.  Normal CT brain  perfusion.  2. No emergent large vessel occlusion.  3. Medium and small vessel irregularity is noted within the anterior circulation. Vessels are small. Question underlying vasculitis.  4. Tortuosity of the distal cervical left ICA. There is no significant stenosis. This is commonly seen in the setting of hypertension.  5. Scoliosis.    Mr Brain Wo Contrast 01/30/2018 IMPRESSION:  Scattered diffusion abnormality overlying the right cerebral convexity as well as the suprasellar cistern, most concerning for pial exudate related to meningitis given provided history. Associated scattered multifocal acute to early subacute ischemic infarcts as detailed above. Specific note made of diffusion abnormality at the ventral right thalamus/internal capsule, which could account for patient's left-sided symptoms. Correlation with CSF analysis suggested.    Mr Jeri Cos Contrast 01/30/2018 IMPRESSION:  Scattered leptomeningeal enhancement overlying the right cerebral convexity and about the perimesencephalic and supra cerebellar cisterns, most consistent with meningitis. Associated patchy post-contrast enhancement within the bilateral cerebral hemispheres and cerebellum as above, compatible with associated cerebritis. No frank intracerebral abscess or empyema at this time.    Ct Head Code Stroke Wo Contrast 01/30/2018 IMPRESSION:  1. Normal CT appearance of the brain.  2. Postsurgical changes of the  anterior maxillary sinuses and nasal bones.  3. Stable chronic sinus opacification.  4. ASPECTS is 10/10     PHYSICAL EXAM  Temp:  [97.8 F (36.6 C)-99.4 F (37.4 C)] 98.5 F (36.9 C) (01/12 1314) Pulse Rate:  [87-113] 95 (01/12 1314) Resp:  [12-27] 15 (01/12 1314) BP: (142-156)/(89-97) 149/90 (01/12 1314) SpO2:  [96 %-100 %] 98 % (01/12 1314)  General - Well nourished, well developed, in no apparent distress.  Ophthalmologic - fundi not visualized due to noncooperation.  Cardiovascular - Regular  rate and rhythm.  Neuro -awake alert, no dysarthria or aphasia, however difficulty with orientation questions due to total hair loss, however able to follow simple written commands.  PERRL, EOMI, blinking to visual threat bilaterally, facial symmetrical, tongue midline.  Moving all extremities symmetrically, however has left finger-to-nose mild ataxia.  Sensation symmetrical.  Gait not tested.   ASSESSMENT/PLAN Ms. Emma Martin is a 38 y.o. 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 She did not receive IV t-PA due to risks felt to outweigh benefits.  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   MRI Brain - Multiple infarcts involving right thalamus/IC, left SCA territory, and right semiovale  CTA H&N -  Question underlying vasculitis. No focal ischemia or tissue at risk.    CT perfusion - normal CT brain perfusion.     Recommend to repeat TEE for endocarditis evaluation  LDL - 152   HgbA1c - 5.1  VTE prophylaxis - none  Diet  - Heart healthy with thin liquids.  No antithrombotic prior to admission, now on No antithrombotic.  No antiplatelet indicated for septic emboli due to endocarditis  Ongoing aggressive antibiotic treatment  Therapy recommendations:  pending  Disposition:  Pending  Strep pneumo meningitis  Patient recently admitted on 01/15/2018 for strep pneumo bacteremia and meningitis, CSF consistent with meningitis, patient was put on vancomycin and Rocephin.  TEE 01/19/2018 showed no vegetation  Has been on Vanco and Rocephin at home and to admission  MRI Brain with  contrast this time - Scattered leptomeningeal enhancement overlying the right cerebral convexity and about the perimesencephalic and supra cerebellar cisterns, most consistent with meningitis. Associated patchy post-contrast enhancement within the bilateral cerebral hemispheres and cerebellum as above, compatible with associated cerebritis.  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 Long-term BP goal normotensive  Hyperlipidemia  Lipid lowering medication PTA:  none  LDL 152, goal < 70  Current lipid lowering medication: Lipitor 20  Continue statin at discharge  Other Stroke Risk Factors    Other Active Problems  Turner syndrome  Depression on Prozac  Hospital day # 1  I spent  35 minutes in total face-to-face time with the patient, more than 50% of which  was spent in counseling and coordination of care, reviewing test results, images and medication, and discussing the diagnosis of strep pneumo meningitis, total hearing loss, septic embolism treatment plan and potential prognosis. This patient's care requiresreview of multiple databases, neurological assessment, discussion with family, other specialists and medical decision making of high complexity. I had long discussion with father at bedside and sister over the phone, updated pt current condition, treatment plan and potential prognosis. They expressed understanding and appreciation.  I also discussed with Dr. Lonny Prude.  Rosalin Hawking,  MD PhD Stroke Neurology 01/31/2018 4:59 PM   To contact Stroke Continuity provider, please refer to [email or web address] After hours, contact General Neurology

## 2018-02-01 DIAGNOSIS — I639 Cerebral infarction, unspecified: Secondary | ICD-10-CM

## 2018-02-01 DIAGNOSIS — I10 Essential (primary) hypertension: Secondary | ICD-10-CM

## 2018-02-01 DIAGNOSIS — R0682 Tachypnea, not elsewhere classified: Secondary | ICD-10-CM

## 2018-02-01 LAB — VANCOMYCIN, TROUGH: Vancomycin Tr: 11 ug/mL — ABNORMAL LOW (ref 15–20)

## 2018-02-01 MED ORDER — VANCOMYCIN HCL 10 G IV SOLR
1250.0000 mg | Freq: Three times a day (TID) | INTRAVENOUS | Status: DC
  Administered 2018-02-01 – 2018-02-03 (×6): 1250 mg via INTRAVENOUS
  Filled 2018-02-01 (×8): qty 1250

## 2018-02-01 NOTE — Progress Notes (Signed)
    [facility] has been requested to perform a transesophageal echocardiogram on Emma Martin for stroke.  After careful review of history and examination, the risks and benefits of transesophageal echocardiogram have been explained including risks of esophageal damage, perforation (1:10,000 risk), bleeding, pharyngeal hematoma as well as other potential complications associated with conscious sedation including aspiration, arrhythmia, respiratory failure and death. Alternatives to treatment were discussed, questions were answered. The patient is deaf so I dictated the information into a note on my phone and the patient read it. I also discussed with her father who was present. Pt had no problems with her prior TEE.  Patient is willing to proceed.   She is on long term antibiotics for strep bacteremia and pneumococcal meningitis 2 weeks prior. Bicuspid aortic valve. Stroke felt to be related to septic emboli. TEE 01/19/18 showed no vegetation.   The procedure is scheduled for 02/02/18 at 11:00 with Dr. Margaretann Loveless.   Daune Perch, NP  02/01/2018 2:43 PM

## 2018-02-01 NOTE — Evaluation (Addendum)
Occupational Therapy Evaluation Patient Details Name: Emma Martin MRN: [id number] DOB: 18-Jul-1980 Today's Date: 02/01/2018    History of Present Illness Emma Martin is a 38 y.o. female with a history of hypertension, hypothyroidism, Turner syndrome, scoliosis status post Harrington rods, bicuspid aortic valve, streptococcal meningitis and bacteremia. 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   Clinical Impression   Pt admitted with above. She demonstrates the below listed deficits and will benefit from continued OT to maximize safety and independence with BADLs.  Pt presents to OT with generalized weakness, Lt UE incoordination, impaired balance, visual/perceptual deficits including nystagmus noted and ? Lt inattention vs field deficit.  She demonstrates sustained attention for familiar ADL tasks, but requires max cues to attend to novel tasks.  He requires mod - max cues to execute a 2 step functional command.  Prior to onset of meningitis, pt was fully independent.  Her father has been assisting her since onset of meningitis, and he reports her cognition is significantly worse than it has been.  Recommend CIR level rehab at discharge.   I have discussed the patient's current level of function related to ADLs, balance, cognition, and mobililty with the patient and father..  They acknowledge understanding of this and do not feel the patient would be able to have their care needs met at home.  They are interested in post-acute rehab in an inpatient setting.        Follow Up Recommendations  CIR    Equipment Recommendations  None recommended by OT    Recommendations for Other Services Rehab consult     Precautions / Restrictions Precautions Precautions: Fall Restrictions Weight Bearing Restrictions: No      Mobility Bed Mobility Overal bed mobility: Needs  Assistance Bed Mobility: Rolling;Sidelying to Sit;Sit to Supine Rolling: Supervision Sidelying to sit: Supervision          Transfers Overall transfer level: Needs assistance Equipment used: Rolling walker (2 wheeled);Quad cane Transfers: Sit to/from Stand Sit to Stand: Min guard;Min assist         General transfer comment: Pt able to stand with min A to provide stability.     Balance Overall balance assessment: Needs assistance Sitting-balance support: No upper extremity supported;Feet unsupported Sitting balance-Leahy Scale: Fair Sitting balance - Comments: able to sit EOB with min gaurd assist    Standing balance support: Single extremity supported Standing balance-Leahy Scale: Poor Standing balance comment: requires min A to maintain balance                            ADL either performed or assessed with clinical judgement   ADL Overall ADL's : Needs assistance/impaired Eating/Feeding: Set up;Supervision/ safety;Sitting   Grooming: Wash/dry hands;Wash/dry face;Oral care;Brushing hair;Min guard;Standing   Upper Body Bathing: Minimal assistance;Sitting   Lower Body Bathing: Minimal assistance;Sit to/from stand   Upper Body Dressing : Minimal assistance;Sitting   Lower Body Dressing: Minimal assistance;Sit to/from stand   Toilet Transfer: Minimal assistance;Ambulation;Comfort height toilet   Toileting- Clothing Manipulation and Hygiene: Minimal assistance;Sit to/from stand Toileting - Clothing Manipulation Details (indicate cue type and reason): Pt was incontinent of stool     Functional mobility during ADLs: Minimal assistance       Vision Baseline Vision/History: Wears glasses Wears Glasses: At all times Patient Visual Report: No change from baseline Additional Comments: Pt unable to attend  to formal visual assessment despite max cues.  Pt with questionable Lt field deficit vs inattention      Perception Perception Comments: ? 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 )   Cognition Arousal/Alertness: Awake/alert Behavior During Therapy: Flat affect;Impulsive Overall Cognitive Status: Impaired/Different from baseline Area of Impairment: Attention;Memory;Following commands;Safety/judgement;Problem solving;Awareness                   Current Attention Level: Sustained Memory: Decreased short-term memory Following Commands: Follows one step commands consistently;Follows multi-step commands inconsistently(with cues ) Safety/Judgement: Decreased awareness of safety   Problem Solving: Difficulty sequencing;Requires verbal cues;Requires tactile cues General Comments: Pt requires max cues to follow 2 step commands.  She is able to sustain attention to complete simple, familiar ADL tasks, but is easily distracted when performing novel tasks and requires max cues to attend.   Pt internally distracted and will dead stop when ambulating and unable to divide attention to correct situation and continue to walk, or to move into a safe position (i.e. diaper slid down while walking, and pt stopped in hallway, lifted gown and attempted to manipulate diaper).     General Comments  father present.  Discussed current deficits with him, and recommendation for CIR     Exercises     Shoulder Instructions      Home Living Family/patient expects to be discharged to:: Private residence Living Arrangements:  Parent Available Help at Discharge: Family;Available 24 hours/day Type of Home: House Home Access: Stairs to enter [employer] of Steps: 2 Entrance Stairs-Rails: Right;Left Home Layout: Two level;Bed/bath upstairs Alternate Level Stairs-Number of Steps: flight, with stair lift  Alternate Level Stairs-Rails: Right;Left Bathroom Shower/Tub: Occupational psychologist: Handicapped height     Home Equipment: Cane - quad;Other (comment);Walker - 2 wheels;Bedside commode;Shower seat;Wheelchair - Equities trader )          Prior Functioning/Environment Level of Independence: Independent        Comments: Pt was fully independent.  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, sport and exercise Problem List: Decreased strength;Decreased activity tolerance;Impaired balance (sitting and/or standing);Impaired vision/perception;Decreased coordination;Decreased cognition;Decreased safety awareness      OT Treatment/Interventions: Self-care/ADL training;DME and/or AE instruction;Neuromuscular education;Therapeutic activities;Cognitive remediation/compensation;Visual/perceptual remediation/compensation;Patient/family education;Balance training    OT Goals(Current goals can be found in the care plan section) Acute Rehab OT Goals Patient Stated Goal: Pt wants to discharge home  OT Goal Formulation: With patient/family Time For Goal Achievement: 02/15/18 Potential to Achieve Goals: Good  OT Frequency: Min 2X/week   Barriers to D/C:            Co-evaluation              AM-PAC OT "6 Clicks" Daily Activity     Outcome Measure Help from another person eating meals?: A Little Help from another person taking care of personal grooming?: A Little Help from another person toileting, which includes using toliet, bedpan, or urinal?: A Little Help from  another person bathing (including washing, rinsing, drying)?: A Little Help from another person to put on  and taking off regular upper body clothing?: A Little Help from another person to put on and taking off regular lower body clothing?: A Little 6 Click Score: 18   End of Session Equipment Utilized During Treatment: Gait belt Nurse Communication: Mobility status  Activity Tolerance: Patient tolerated treatment well Patient left: in bed;with call bell/phone within reach;with bed alarm set;with family/visitor present  OT Visit Diagnosis: Unsteadiness on feet (R26.81);Cognitive communication deficit (R41.841) Symptoms and signs involving cognitive functions: Cerebral infarction                Time: 7412-8786 OT Time Calculation (min): 47 min Charges:  OT General Charges $OT Visit: 1 Visit OT Evaluation $OT Eval Moderate Complexity: 1 Mod  [email or web address]   Dudley Cooley M 02/01/2018, 3:22 PM

## 2018-02-01 NOTE — Progress Notes (Addendum)
[facility]  Date of Admission:  01/30/2018   Total days of antibiotics 17        Day 17 Vancomycin        Day 17 Ceftriaxone         ASSESSMENT: Emma Martin is a 38 year old female with a history of Turner's syndrome, Bicuspid aortic valve, Hypothyroidism, Hypertension, and recent admission on 12/27 for meningitis with bacteremia, which was complicated by bilateral hearing loss (for which she has ENT follow up) who presented on 1/11 with left sided weakness. She was found to have multiple infarcts concerning for embolic source. 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 TEE during prior admission was negative, though this was early in her bacteremia; repeat TEE planned given presentation this admission. We have continued previous antibiotic regimen for Strep Pneumo Bacteremia (which was scheduled to be completed on 1/10) pending repeat cultures (currently NGTD) and TEE results. Drowsy this morning, will see again later today.  PLAN:  1. CVA, Recent Bacteremia  - Continue Vancomycin and Ceftriaxone - Repeat TEE  Principal Problem:   Acute ischemic stroke [facility]) Active Problems:   Bicuspid aortic valve   Pneumococcal bacteremia   Pneumococcal meningitis   Sensorineural hearing loss (SNHL) of both ears   HTN (hypertension)   Hypothyroidism   Generalized anxiety disorder   Turner syndrome   Dyslipidemia   Scheduled Meds: .  stroke: mapping our early stages of recovery book   Does not apply Once  . atorvastatin  20 mg Oral q1800  . FLUoxetine  40 mg Oral Daily  . fluticasone  2 spray Each Nare Daily  . levothyroxine  75 mcg Oral QAC breakfast   Continuous Infusions: . cefTRIAXone (ROCEPHIN)  IV Stopped (01/31/18 2020)  . vancomycin 1,000 mg (02/01/18 0432)   PRN Meds:.acetaminophen **OR** acetaminophen (TYLENOL) oral liquid 160 mg/5 mL **OR** acetaminophen, senna-docusate   SUBJECTIVE: Emma Martin was drowsy this  morning. She was able to be briefly roused, but would soon drift off to sleep. Attempted to have patient read updated on her care and questions but she would not stay awake to participate in exam. Her father who was at the bedside states she was texting him what seemed to be nonsense later in the evening, which was a change for her. Father was updated on care plan.  Review of Systems: ROS Patient drowsy and did not participate in ROS  Allergies  Allergen Reactions  . Benzoin Rash    OBJECTIVE: Vitals:   01/31/18 2330 01/31/18 2336 02/01/18 0424 02/01/18 0909  BP:  (!) 150/90 (!) 151/86 (!) 150/97  Pulse:  89 85 (!) 101  Resp:  18 17 18   Temp:  98.2 F (36.8 C) 98.3 F (36.8 C) 97.8 F (36.6 C)  TempSrc:  Oral Oral Oral  SpO2:  100% 100% 99%  Weight: 50.8 kg     Height: 5\' 1"  (1.549 m)      Body mass index is 21.16 kg/m.  Physical Exam Constitutional:      Comments: Drowsy  Cardiovascular:     Rate and Rhythm: Normal rate and regular rhythm.     Heart sounds: Normal heart sounds.  Pulmonary:     Effort: Pulmonary effort is normal. No respiratory distress.     Breath sounds: Normal breath sounds.  Abdominal:     General: Abdomen is flat. Bowel sounds are normal. There is no distension.     Palpations:  Abdomen is soft.     Tenderness: There is no abdominal tenderness.  Musculoskeletal:        General: No swelling, tenderness or deformity.  Skin:    General: Skin is warm and dry.  Neurological:     Mental Status: She is alert.     Comments: Unable to assess mentation given drowsiness and deafness    Lab Results Lab Results  Component Value Date   WBC 10.4 01/30/2018   HGB 13.3 01/30/2018   HCT 39.0 01/30/2018   MCV 88.0 01/30/2018   PLT 327 01/30/2018    Lab Results  Component Value Date   CREATININE 0.20 (L) 01/30/2018   BUN 9 01/30/2018   NA 136 01/30/2018   K 3.6 01/30/2018   CL 98 01/30/2018   CO2 26 01/30/2018    Lab Results  Component Value Date     ALT 386 (H) 01/30/2018   AST 184 (H) 01/30/2018   ALKPHOS 216 (H) 01/30/2018   BILITOT 0.9 01/30/2018     Microbiology: Recent Results (from the past 240 hour(s))  Culture, blood (routine x 2)     Status: None (Preliminary result)   Collection Time: 01/30/18  5:25 PM  Result Value Ref Range Status   Specimen Description BLOOD LEFT FOREARM  Final   Special Requests   Final    BOTTLES DRAWN AEROBIC AND ANAEROBIC Blood Culture adequate volume   Culture   Final    NO GROWTH 2 DAYS Performed at [facility], [place] [address] [place], [place] [address]    Report Status PENDING  Incomplete  Culture, blood (routine x 2)     Status: None (Preliminary result)   Collection Time: 01/30/18  5:28 PM  Result Value Ref Range Status   Specimen Description BLOOD LEFT ANTECUBITAL  Final   Special Requests   Final    BOTTLES DRAWN AEROBIC AND ANAEROBIC Blood Culture adequate volume   Culture   Final    NO GROWTH 2 DAYS Performed at [facility], [place] [address] [place], [place] [address]    Report Status PENDING  Incomplete  MRSA PCR Screening     Status: None   Collection Time: 01/30/18  6:02 PM  Result Value Ref Range Status   MRSA by PCR NEGATIVE NEGATIVE Final    Comment:        The GeneXpert MRSA Assay (FDA approved for NASAL specimens only), is one component of a comprehensive MRSA colonization surveillance program. It is not intended to diagnose MRSA infection nor to guide or monitor treatment for MRSA infections. Performed at [facility], [place] [address], [place], [place] [address]     Neva Seat, MD IM PGY-2 [phone] Attending: [phone] pager   [phone] cell 02/01/2018, 10:16 AM

## 2018-02-01 NOTE — Progress Notes (Signed)
Pharmacy Antibiotic Note  Emma Martin is a 38 y.o. female admitted on 01/30/2018 with left sided weakness and found to have multiple acute strokes concern for embolic phenomenon possibly related to an infectious source. Pt completed a course of Rocephin + Vancomycin on 01/29/2018 for strep pna bacteremia and meningitis.  Abx were restarted on admission given concerns of septic emboli.    Vancomycin trough was 11 on Vancomycin 1gm IV q8h regimen.  This is subtherapeutic.  No doses missed.  Plan: Increase Vancomycin to 1250mg  IV q8h. Continue Rocephin 2gm IV q12h. Follow-up cx data and antibiotic plan.  Height: 5\' 1"  (154.9 cm) Weight: 111 lb 15.9 oz (50.8 kg) IBW/kg (Calculated) : 47.8  Temp (24hrs), Avg:98.1 F (36.7 C), Min:97.8 F (36.6 C), Max:98.4 F (36.9 C)  Recent Labs  Lab 01/30/18 0927 01/30/18 0934 01/30/18 1719 02/01/18 1906  WBC 10.4  --   --   --   CREATININE 0.43* 0.20*  --   --   VANCOTROUGH  --   --   --  11*  VANCORANDOM  --   --  <4  --     Estimated Creatinine Clearance: 73.4 mL/min (A) (by C-G formula based on SCr of 0.2 mg/dL (L)).    Allergies  Allergen Reactions  . Benzoin Rash    Antimicrobials this admission: Vanc 1/11 >> Rocephin 1/11 >>  Dose adjustments this admission: 02/01/2018 VT 11 >> dose increased to 1250mg  IV q8h  Microbiology results: 1/11 BCx: ngtd 1/11 MRSA PCR: negative  Thank you for allowing pharmacy to be a part of this patient's care.  [employer], Pharm.D., BCPS Clinical Pharmacist  **Pharmacist phone directory can now be found on [email or web address] ([email or web address]).  Listed under [place].  02/01/2018 8:44 PM

## 2018-02-01 NOTE — Progress Notes (Addendum)
PROGRESS NOTE    Emma Martin  SPQ:[id number] DOB: [birth year] DOA: 01/30/2018 PCP: Maurice Small, MD   Brief Narrative: Emma Martin is a 38 y.o. female with a history of hypertension, hypothyroidism, Turner syndrome, scoliosis status post Harrington rods, bicuspid aortic valve, streptococcal meningitis and bacteremia. 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   Assessment & Plan:   Principal Problem:   Acute ischemic stroke [facility]) Active Problems:   Bicuspid aortic valve   HTN (hypertension)   Hypothyroidism   Generalized anxiety disorder   Pneumococcal bacteremia   Pneumococcal meningitis   Turner syndrome   Sensorineural hearing loss (SNHL) of both ears   Dyslipidemia   Acute ischemic stroke Likely embolic. Concern for septic emboli in setting of recent strep pneumo bacteremia and meningitis.  Patient has a recent TEE without evidence of vegetations.  Blood cultures prior to discharge were no growth. -Neurology recommendations: Transesophageal Echocardiogram -ID consult -Continue vancomycin/ceftriaxone for now -Transesophageal Echocardiogram planned for 1/14; NPO after midnight  History of streptococcal pneumonia bacteremia/meningitis Treated with 2 weeks of vancomycin and ceftriaxone.  Work-up as mentioned above.  Completed IV course on January 10.  Hearing loss Complete hearing loss secondary to history of streptococcal pneumonia meningitis.  Patient has outpatient plans for [facility] ENT follow-up  Essential hypertension Patient is on lisinopril an outpatient.  Currently not resumed in setting of permissive hypertension.  Hypothyroidism -Continue Synthroid  Mood disorder -Continue Prozac  Bicuspid valve   DVT prophylaxis: SCDs Code Status:   Code Status: Full Code Family Communication: Father and uncle at bedside Disposition Plan: Discharge  pending stroke work-up and ID work-up   Consultants:   Neurology  Infectious disease  Procedures:   None  Antimicrobials:  Vancomycin  Ceftriaxone   Subjective: Patient has been somewhat sleepy  Objective: Vitals:   01/31/18 2336 02/01/18 0424 02/01/18 0909 02/01/18 1146  BP: (!) 150/90 (!) 151/86 (!) 150/97 (!) 151/87  Pulse: 89 85 (!) 101 97  Resp: 18 17 18 18   Temp: 98.2 F (36.8 C) 98.3 F (36.8 C) 97.8 F (36.6 C) 98.4 F (36.9 C)  TempSrc: Oral Oral Oral Oral  SpO2: 100% 100% 99% 100%  Weight:      Height:        Intake/Output Summary (Last 24 hours) at 02/01/2018 1147 Last data filed at 02/01/2018 0900 Gross per 24 hour  Intake 1440 ml  Output 2125 ml  Net -685 ml   Filed Weights   01/30/18 1018 01/31/18 2330  Weight: 49.7 kg 50.8 kg    Examination:  General exam: Appears calm and comfortable Respiratory system: Clear to auscultation. Respiratory effort normal. Cardiovascular system: S1 & S2 heard, RRR. 2/6 systolic murmur Gastrointestinal system: Abdomen is nondistended, soft and nontender. No organomegaly or masses felt. Normal bowel sounds heard. Central nervous system: Alert Extremities: No edema. No calf tenderness Skin: No cyanosis. No rashes Psychiatry: Judgement and insight appear normal. Mood & affect appropriate.     Data Reviewed: I have personally reviewed following labs and imaging studies  CBC: Recent Labs  Lab 01/30/18 0927 01/30/18 0934  WBC 10.4  --   NEUTROABS 8.5*  --   HGB 12.4 13.3  HCT 38.8 39.0  MCV 88.0  --   PLT 327  --    Basic Metabolic Panel: Recent Labs  Lab 01/30/18 0927 01/30/18 0934  NA 136 136  K 3.5 3.6  CL 100 98  CO2 26  --   GLUCOSE 97 89  BUN 7 9  CREATININE 0.43* 0.20*  CALCIUM 8.8*  --    GFR: Estimated Creatinine Clearance: 73.4 mL/min (A) (by C-G formula based on SCr of 0.2 mg/dL (L)). Liver Function Tests: Recent Labs  Lab 01/30/18 0927  AST 184*  ALT 386*  ALKPHOS 216*   BILITOT 0.9  PROT 6.8  ALBUMIN 2.9*   No results for input(s): LIPASE, AMYLASE in the last 168 hours. No results for input(s): AMMONIA in the last 168 hours. Coagulation Profile: Recent Labs  Lab 01/30/18 0927  INR 1.11   Cardiac Enzymes: No results for input(s): CKTOTAL, CKMB, CKMBINDEX, TROPONINI in the last 168 hours. BNP (last 3 results) No results for input(s): PROBNP in the last 8760 hours. HbA1C: Recent Labs    01/31/18 0701  HGBA1C 5.1   CBG: Recent Labs  Lab 01/30/18 0928  GLUCAP 76   Lipid Profile: Recent Labs    01/31/18 0701  CHOL 214*  HDL 45  LDLCALC 152*  TRIG 84  CHOLHDL 4.8   Thyroid Function Tests: No results for input(s): TSH, T4TOTAL, FREET4, T3FREE, THYROIDAB in the last 72 hours. Anemia Panel: No results for input(s): VITAMINB12, FOLATE, FERRITIN, TIBC, IRON, RETICCTPCT in the last 72 hours. Sepsis Labs: No results for input(s): PROCALCITON, LATICACIDVEN in the last 168 hours.  Recent Results (from the past 240 hour(s))  Culture, blood (routine x 2)     Status: None (Preliminary result)   Collection Time: 01/30/18  5:25 PM  Result Value Ref Range Status   Specimen Description BLOOD LEFT FOREARM  Final   Special Requests   Final    BOTTLES DRAWN AEROBIC AND ANAEROBIC Blood Culture adequate volume   Culture   Final    NO GROWTH 2 DAYS Performed at [facility], [address], [place], [place] [address]    Report Status PENDING  Incomplete  Culture, blood (routine x 2)     Status: None (Preliminary result)   Collection Time: 01/30/18  5:28 PM  Result Value Ref Range Status   Specimen Description BLOOD LEFT ANTECUBITAL  Final   Special Requests   Final    BOTTLES DRAWN AEROBIC AND ANAEROBIC Blood Culture adequate volume   Culture   Final    NO GROWTH 2 DAYS Performed at [facility], [place] [address] [place], [place] [address]    Report Status PENDING  Incomplete  MRSA PCR Screening     Status: None   Collection  Time: 01/30/18  6:02 PM  Result Value Ref Range Status   MRSA by PCR NEGATIVE NEGATIVE Final    Comment:        The GeneXpert MRSA Assay (FDA approved for NASAL specimens only), is one component of a comprehensive MRSA colonization surveillance program. It is not intended to diagnose MRSA infection nor to guide or monitor treatment for MRSA infections. Performed at [facility], [place] [address] [place], [place] [address]          Radiology Studies: Mr Brain Wo Contrast  Result Date: 01/30/2018 CLINICAL DATA:  Follow-up examination for acute stroke, left-sided weakness. Recent history of bacteremia with meningitis. EXAM: MRI HEAD WITHOUT CONTRAST TECHNIQUE: Multiplanar, multiecho pulse sequences of the brain and surrounding structures were obtained without intravenous contrast. COMPARISON:  Prior CT and CTA from earlier the same day. FINDINGS: Brain: Cerebral volume within normal limits for age. Few scattered areas of diffusion  abnormality seen involving the bilateral cerebral hemispheres, suspicious for acute to early subacute ischemic infarcts. These involve the subcortical and deep white matter of the posterior right frontal region (series 5, image 90, 88). Involvement of the subcortical left frontal white matter (series 5, image 86). Associated small amount of susceptibility artifact at this location consistent with petechial hemorrhage (series 12, image 36). 14 mm focus of diffusion at the right thalamus/genu of the right internal capsule (series 5, image 81). Patchy infarcts also seen involving the peripheral right cerebellum near the brachium pontis (series 5, image 68) as well as the cerebellar vermis (series 5, image 70). Additional diffusion abnormality seen overlying the surface of the high right cerebral convexity (series 5, image 97, 94). Additional diffusion abnormality present at the suprasellar cistern (series 5, image 77). Probable mild signal abnormality surrounding the  midbrain and perimesencephalic cistern as well. Constellation of findings favored to reflect sequelae of meningitis with associated pial exudate and multifocal infarcts. No other associated hemorrhage. Few additional scattered punctate micro hemorrhages seen scattered within the brain, likely related to history of hypertension. No frank intracerebral abscess or empyema identified on this noncontrast examination. No mass lesion, midline shift, or significant mass effect. Ventricles normal size without hydrocephalus. Pituitary gland suprasellar region normal. Midline structures intact and normal. Vascular: Major intracranial vascular flow voids maintained. Skull and upper cervical spine: Craniocervical junction within normal limits. Visualized upper cervical spine unremarkable. No focal marrow replacing lesion. Scalp soft tissues demonstrate no acute finding. Sinuses/Orbits: Globes and orbital soft tissues within normal limits. Left sphenoid sinus completely opacified. Scattered mucosal thickening throughout the remaining paranasal sinuses. Small right mastoid effusion noted. Inner ear structures within normal limits. Other: None. IMPRESSION: Scattered diffusion abnormality overlying the right cerebral convexity as well as the suprasellar cistern, most concerning for pial exudate related to meningitis given provided history. Associated scattered multifocal acute to early subacute ischemic infarcts as detailed above. Specific note made of diffusion abnormality at the ventral right thalamus/internal capsule, which could account for patient's left-sided symptoms. Correlation with CSF analysis suggested. Electronically Signed   By: Jeannine Boga M.D.   On: 01/30/2018 14:41   Mr Brain W Contrast  Result Date: 01/30/2018 CLINICAL DATA:  Initial evaluation for bacterial meningitis. EXAM: MRI HEAD WITH CONTRAST TECHNIQUE: Multiplanar, multiecho pulse sequences of the brain and surrounding structures were obtained  with intravenous contrast. CONTRAST:  4 cc of Gadavist. COMPARISON:  Prior noncontrast MRI from earlier the same day. FINDINGS: Postcontrast imaging demonstrates scattered left meningeal and dural enhancement overlying the right cerebral convexity, corresponding with previously seen diffusion abnormality, compatible with meningitis. There are a few scattered small areas of focal cortical enhancement within the underlying right frontal and parietal lobes (series 6, images 49, 50), consistent with associated cerebritis. Similar leptomeningeal enhancement noted about the perimesencephalic cistern and midbrain, extending into the suprasellar cistern. Patchy enhancement within the cerebellar vermis consistent with associated cerebritis (series 6, images 25, 29). Additionally, there are scattered multifocal foci of enhancement seen involving the subcortical and deep white matter of both cerebral hemispheres, some of which correspond with previously seen infarcts. These are likely perivascular in nature, and also compatible with meningitis (series 7, images 14, 20). No frank intracerebral abscess or empyema. No significant ependymal enhancement to suggest ventriculitis. No hydrocephalus. No findings to suggest venous thrombosis. No significant mass effect or midline shift. IMPRESSION: Scattered leptomeningeal enhancement overlying the right cerebral convexity and about the perimesencephalic and supra cerebellar cisterns, most consistent with meningitis. Associated  patchy post-contrast enhancement within the bilateral cerebral hemispheres and cerebellum as above, compatible with associated cerebritis. No frank intracerebral abscess or empyema at this time. Electronically Signed   By: Jeannine Boga M.D.   On: 01/30/2018 15:55        Scheduled Meds: .  stroke: mapping our early stages of recovery book   Does not apply Once  . atorvastatin  20 mg Oral q1800  . FLUoxetine  40 mg Oral Daily  . fluticasone  2  spray Each Nare Daily  . levothyroxine  75 mcg Oral QAC breakfast   Continuous Infusions: . cefTRIAXone (ROCEPHIN)  IV 2 g (02/01/18 1047)  . vancomycin 1,000 mg (02/01/18 0432)     LOS: 2 days     Cordelia Poche, MD [facility] 02/01/2018, 11:47 AM  If 7PM-7AM, please contact night-coverage [email or web address]

## 2018-02-01 NOTE — Consult Note (Signed)
Physical Medicine and Rehabilitation Consult Reason for Consult:  Left side weakness Referring Physician: [facility]   HPI: Emma Martin is a 38 y.o.right handed female  With history of hypertension, hypothyroidism. Turner syndrome, scoliosis status post Harrington rod placement, bicuspid aortic valve.  Per chart review, patient lives with her father. Independent prior to admission using a walker. 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 Presented 01/30/2018 with left-sided weakness. She was found on the floor her father unresponsive. Cranial CT reviewed, unremarkable for acute intracranial process. Patient did not receive TPA. CT angiogram of head and neck with no emergent large vessel occlusion. MRI/MRA showed scattered diffusion abnormality overlying the right cerebral convexity as well as the suprasellar cistern. Associated scattered multifocal acute early subacute ischemic infarcts. Most recent echocardiogram with ejection fraction of 55% no wall motion abnormalities. Patient is currently followed by infectious disease for recent bacteremia maintained on antibiotic therapy which initially had been scheduled to be completed 01/29/2018. Tolerating a regular diet. Therapy evaluations completed with recommendations of physical medicine rehabilitation consult.   Review of Systems  Unable to perform ROS: Mental acuity   Past Medical History:  Diagnosis Date  . Bicuspid aortic valve    , mild aortic insufficiency. (No SBE prophylaxis needed)  . Complication of anesthesia   . Dyslipidemia   . Generalized anxiety disorder    , with obsessive-compulsive traits.  . Hypertension   . Hypothyroidism   . Mixed gonadal dysgenesis    , (additional Y-bearing cell line) for which she reportedly underwent bilateral gonadectomy (patient  unaware; needs confirmation) due to malignancy potential.  . PONV (postoperative nausea and vomiting)   . Primary amenorrhea    , treated starting at age 75 or 47 with estrogen and progesterone  . Scoliosis    , treated with Harrington rods (1997) for stabilization.  . Seasonal allergic rhinitis   . Short stature    , previously treated with growth hormone age 61-15 years.  Radford Pax syndrome    , previously followed by pediatric endocrinologist Freddy Jaksch, MD at [facility] through her early 26s).   Past Surgical History:  Procedure Laterality Date  . TEE WITHOUT CARDIOVERSION N/A 01/19/2018   Procedure: TRANSESOPHAGEAL ECHOCARDIOGRAM (TEE);  Surgeon: Jerline Pain, MD;  Location: [facility] ENDOSCOPY;  Service: Cardiovascular;  Laterality: N/A;   Family History  Problem Relation Age of Onset  . Other Father        A + W  . Cancer Maternal Grandmother   . Cancer Paternal Grandfather   . Hypertension Mother   . Other Mother        Dyslipidemia  . Hypothyroidism Mother   . Mitral valve prolapse Mother    Social History:  reports that she has never smoked. She has never used smokeless tobacco. She reports current alcohol use of about 1.0 - 2.0 standard drinks of alcohol per week. She reports that she does not use drugs. Allergies:  Allergies  Allergen Reactions  . Benzoin Rash   Medications Prior to Admission  Medication Sig Dispense Refill  . FLUoxetine (PROZAC) 40 MG capsule Take 40 mg by mouth daily.    . fluticasone (FLONASE) 50 MCG/ACT nasal spray Place 2 sprays into both nostrils daily.    Marland Kitchen ibuprofen (ADVIL,MOTRIN) 200 MG tablet Take 200-400 mg by mouth every 8 (eight) hours as needed for headache.    Marland Kitchen  levothyroxine (SYNTHROID, LEVOTHROID) 75 MCG tablet Take 75 mcg by mouth daily before breakfast.    . lisinopril (PRINIVIL,ZESTRIL) 10 MG tablet Take 1 tablet (10 mg total) by mouth daily. 30 tablet 0  . Multiple Vitamins-Minerals (CENTRUM PO) Take 1 tablet by mouth  daily.    . norethindrone-ethinyl estradiol-iron (MICROGESTIN FE,GILDESS FE,LOESTRIN FE) 1.5-30 MG-MCG tablet Take 1 tablet by mouth daily.    . [EXPIRED] cefTRIAXone (ROCEPHIN) IVPB Inject 2 g into the vein every 12 (twelve) hours for 10 days. Indication:  meningitis Last Day of Therapy:  01/29/2018 Labs - Once weekly:  CBC/D and BMP, Labs - Every other week:  ESR and CRP (Patient not taking: Reported on 01/30/2018) 20 Units 0  . [EXPIRED] vancomycin IVPB Inject 1,000 mg into the vein every 8 (eight) hours for 10 days. Indication:  meningitis Last Day of Therapy:  01/29/2018 Labs - Sunday/Monday:  CBC/D, BMP, and vancomycin trough. Labs - Thursday:  BMP and vancomycin trough Labs - Every other week:  ESR and CRP (Patient not taking: Reported on 01/30/2018) 30 Units 0    Home: Home Living Family/patient expects to be discharged to:: Private residence Living Arrangements: Parent Available Help at Discharge: Family, Available 24 hours/day Type of Home: House Home Access: Stairs to enter [employer] of Steps: 2 Entrance Stairs-Rails: Right, Left Home Layout: Two level, Bed/bath upstairs Alternate Level Stairs-Number of Steps: flight, with stair lift  Alternate Level Stairs-Rails: Right, Left Bathroom Shower/Tub: Multimedia programmer: Handicapped height Home Equipment: Cane - quad, Other (comment)  Functional History: Prior Function Level of Independence: Independent Functional Status:  Mobility: Bed Mobility Overal bed mobility: Needs Assistance Bed Mobility: Rolling, Sidelying to Sit, Sit to Supine Rolling: Supervision Sidelying to sit: Supervision Transfers Overall transfer level: Needs assistance Equipment used: Rolling walker (2 wheeled), Quad cane Transfers: Sit to/from Stand Sit to Stand: Min guard, Min assist General transfer comment: Pt able to stand with min A to provide stability.  Ambulation/Gait Ambulation/Gait assistance: Min assist Gait  Distance (Feet): 60 Feet(requiring intervention every 5-10' ) Assistive device: Rolling walker (2 wheeled) Gait Pattern/deviations: Staggering left, Step-to pattern, Drifts right/left General Gait Details: Pt with step to pattern, L path deviation and poor obstical avoidance. at times min A to mod A to correct and stabilize with RW  Gait velocity: decr    ADL:    Cognition: Cognition Overall Cognitive Status: Within Functional Limits for tasks assessed Orientation Level: Oriented X4 Cognition Arousal/Alertness: Awake/alert Behavior During Therapy: Flat affect Overall Cognitive Status: Within Functional Limits for tasks assessed General Comments: pt AO4, exhibiting potential poor insights into deficits, understating weakness and balance deficits, communication tonight through voice to text, pt reading then responding. able to follow all cues. woudl benefit from further cognitive assessment   Blood pressure (!) 150/97, pulse (!) 101, temperature 97.8 F (36.6 C), temperature source Oral, resp. rate 18, height '5\' 1"'  (1.549 m), weight 50.8 kg, SpO2 99 %. Physical Exam  Vitals reviewed. Constitutional: She appears well-developed and well-nourished.  HENT:  Head: Normocephalic and atraumatic.  Eyes: EOM are normal. Right eye exhibits no discharge. Left eye exhibits no discharge.  Pupils sluggish but reactive to light  Neck: Normal range of motion. Neck supple. No thyromegaly present.  Cardiovascular: Normal rate and regular rhythm.  Respiratory: Effort normal and breath sounds normal. No respiratory distress.  GI: Soft. Bowel sounds are normal. She exhibits no distension.  Musculoskeletal:     Comments: No edema or tenderness in extremities  Neurological:  She is alert.  Patient makes eye contact with examiner.  She is deaf.  Motor: limited due to participation, inconsistently follows commands, grossly 4+/5 throughout  Skin: Skin is warm and dry.  Psychiatric:  Unable to assess due  to mentation    No results found for this or any previous visit (from the past 24 hour(s)). Mr Brain Wo Contrast  Result Date: 01/30/2018 CLINICAL DATA:  Follow-up examination for acute stroke, left-sided weakness. Recent history of bacteremia with meningitis. EXAM: MRI HEAD WITHOUT CONTRAST TECHNIQUE: Multiplanar, multiecho pulse sequences of the brain and surrounding structures were obtained without intravenous contrast. COMPARISON:  Prior CT and CTA from earlier the same day. FINDINGS: Brain: Cerebral volume within normal limits for age. Few scattered areas of diffusion abnormality seen involving the bilateral cerebral hemispheres, suspicious for acute to early subacute ischemic infarcts. These involve the subcortical and deep white matter of the posterior right frontal region (series 5, image 90, 88). Involvement of the subcortical left frontal white matter (series 5, image 86). Associated small amount of susceptibility artifact at this location consistent with petechial hemorrhage (series 12, image 36). 14 mm focus of diffusion at the right thalamus/genu of the right internal capsule (series 5, image 81). Patchy infarcts also seen involving the peripheral right cerebellum near the brachium pontis (series 5, image 68) as well as the cerebellar vermis (series 5, image 70). Additional diffusion abnormality seen overlying the surface of the high right cerebral convexity (series 5, image 97, 94). Additional diffusion abnormality present at the suprasellar cistern (series 5, image 77). Probable mild signal abnormality surrounding the midbrain and perimesencephalic cistern as well. Constellation of findings favored to reflect sequelae of meningitis with associated pial exudate and multifocal infarcts. No other associated hemorrhage. Few additional scattered punctate micro hemorrhages seen scattered within the brain, likely related to history of hypertension. No frank intracerebral abscess or empyema identified  on this noncontrast examination. No mass lesion, midline shift, or significant mass effect. Ventricles normal size without hydrocephalus. Pituitary gland suprasellar region normal. Midline structures intact and normal. Vascular: Major intracranial vascular flow voids maintained. Skull and upper cervical spine: Craniocervical junction within normal limits. Visualized upper cervical spine unremarkable. No focal marrow replacing lesion. Scalp soft tissues demonstrate no acute finding. Sinuses/Orbits: Globes and orbital soft tissues within normal limits. Left sphenoid sinus completely opacified. Scattered mucosal thickening throughout the remaining paranasal sinuses. Small right mastoid effusion noted. Inner ear structures within normal limits. Other: None. IMPRESSION: Scattered diffusion abnormality overlying the right cerebral convexity as well as the suprasellar cistern, most concerning for pial exudate related to meningitis given provided history. Associated scattered multifocal acute to early subacute ischemic infarcts as detailed above. Specific note made of diffusion abnormality at the ventral right thalamus/internal capsule, which could account for patient's left-sided symptoms. Correlation with CSF analysis suggested. Electronically Signed   By: Jeannine Boga M.D.   On: 01/30/2018 14:41   Mr Brain W Contrast  Result Date: 01/30/2018 CLINICAL DATA:  Initial evaluation for bacterial meningitis. EXAM: MRI HEAD WITH CONTRAST TECHNIQUE: Multiplanar, multiecho pulse sequences of the brain and surrounding structures were obtained with intravenous contrast. CONTRAST:  4 cc of Gadavist. COMPARISON:  Prior noncontrast MRI from earlier the same day. FINDINGS: Postcontrast imaging demonstrates scattered left meningeal and dural enhancement overlying the right cerebral convexity, corresponding with previously seen diffusion abnormality, compatible with meningitis. There are a few scattered small areas of focal  cortical enhancement within the underlying right frontal and parietal lobes (series 6, images  49, 50), consistent with associated cerebritis. Similar leptomeningeal enhancement noted about the perimesencephalic cistern and midbrain, extending into the suprasellar cistern. Patchy enhancement within the cerebellar vermis consistent with associated cerebritis (series 6, images 25, 29). Additionally, there are scattered multifocal foci of enhancement seen involving the subcortical and deep white matter of both cerebral hemispheres, some of which correspond with previously seen infarcts. These are likely perivascular in nature, and also compatible with meningitis (series 7, images 14, 20). No frank intracerebral abscess or empyema. No significant ependymal enhancement to suggest ventriculitis. No hydrocephalus. No findings to suggest venous thrombosis. No significant mass effect or midline shift. IMPRESSION: Scattered leptomeningeal enhancement overlying the right cerebral convexity and about the perimesencephalic and supra cerebellar cisterns, most consistent with meningitis. Associated patchy post-contrast enhancement within the bilateral cerebral hemispheres and cerebellum as above, compatible with associated cerebritis. No frank intracerebral abscess or empyema at this time. Electronically Signed   By: Jeannine Boga M.D.   On: 01/30/2018 15:55    Assessment/Plan: Diagnosis: Multifocal infarcts with meningitis Labs and images (see above) independently reviewed.  Records reviewed and summated above.  1. Does the need for close, 24 hr/day medical supervision in concert with the patient's rehab needs make it unreasonable for this patient to be served in a less intensive setting? Potentially  2. Co-Morbidities requiring supervision/potential complications: HTN (monitor and provide prns in accordance with increased physical exertion and pain), hypothyroidism (cont meds), Turner syndrome, scoliosis, bicuspid  aortic valve, tachypnea (monitor RR and O2 Sats with increased physical exertion), ID (d/c IV Vanc when appropriate) 3. Due to bladder management, bowel management, safety, skin/wound care, disease management, medication administration, pain management and patient education, does the patient require 24 hr/day rehab nursing? Yes 4. Does the patient require coordinated care of a physician, rehab nurse, PT (1-2 hrs/day, 5 days/week), OT (1-2 hrs/day, 5 days/week) and SLP (1-2 hrs/day, 5 days/week) to address physical and functional deficits in the context of the above medical diagnosis(es)? Yes Addressing deficits in the following areas: balance, endurance, locomotion, strength, transferring, bathing, dressing, toileting, cognition and psychosocial support 5. Can the patient actively participate in an intensive therapy program of at least 3 hrs of therapy per day at least 5 days per week? Yes 6. The potential for patient to make measurable gains while on inpatient rehab is good 7. Anticipated functional outcomes upon discharge from inpatient rehab are modified independent and supervision  with PT, modified independent and supervision with OT, modified independent and supervision with SLP. 8. Estimated rehab length of stay to reach the above functional goals is: 4-7 days. 9. Anticipated D/C setting: Home 10. Anticipated post D/C treatments: HH therapy and Home excercise program 11. Overall Rehab/Functional Prognosis: good  RECOMMENDATIONS: This patient's condition is appropriate for continued rehabilitative care in the following setting: CIR if patient does not progress to supervision level of functioning after completion of medical workup. Patient has agreed to participate in recommended program. Potentially Note that insurance prior authorization may be required for reimbursement for recommended care.  Comment: Rehab Admissions Coordinator to follow up.   I have personally performed a face to face  diagnostic evaluation, including, but not limited to relevant history and physical exam findings, of this patient and developed relevant assessment and plan.  Additionally, I have reviewed and concur with the physician assistant's documentation above.   Delice Lesch, MD, ABPMR Lavon Paganini Angiulli, PA-C 02/01/2018

## 2018-02-01 NOTE — Progress Notes (Signed)
Inpatient Rehabilitation Admissions Coordinator  I met with pt and her Father at bedside. We discussed inpt rehab vs outpatient therapy. Father was caregiver for his wife for 3 years before she died of ALS so states very familier with care giving aspects of daughter's care. Pt's current BC[employer] has termed 12/19/2017. He is to follow up with payment and [employer] to see if policy can be reactivated. I will follow up tomorrow with final dispo planning.  Danne Baxter, RN, MSN Rehab Admissions Coordinator [phone] 02/01/2018 4:21 PM'

## 2018-02-01 NOTE — Progress Notes (Signed)
Physical Therapy Treatment Patient Details Name: Emma Martin MRN: [id number] DOB: February 14, 1980 Today's Date: 02/01/2018    History of Present Illness Emma Martin is a 38 y.o. female with a history of hypertension, hypothyroidism, Turner syndrome, scoliosis status post Harrington rods, bicuspid aortic valve, streptococcal meningitis and bacteremia. 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    PT Comments    Patient seen in conjunction with OT for activity progression. Tolerated session well but continues to demonstrate significant deficits from even most recent baseline. Noted cognitive, attention, balance, and coordination deficits. Hands on physical assist required at all times. Continue to feel CIR is most appropriate venue for discharge. Father present throughout session and in agreement. He does acknowledge understanding of deficits at current time and does not feel the patient would be able to have their care and rehabilitation needs met at home.  They are interested in post-acute rehab in an inpatient setting.      Follow Up Recommendations  CIR;Supervision for mobility/OOB     Equipment Recommendations  (TBD)    Recommendations for Other Services Rehab consult;OT consult     Precautions / Restrictions Precautions Precautions: Fall Restrictions Weight Bearing Restrictions: No    Mobility  Bed Mobility Overal bed mobility: Needs Assistance Bed Mobility: Rolling;Sidelying to Sit;Sit to Supine Rolling: Supervision Sidelying to sit: Supervision          Transfers Overall transfer level: Needs assistance Equipment used: Rolling walker (2 wheeled);Quad cane Transfers: Sit to/from Stand Sit to Stand: Min guard;Min assist         General transfer comment: Pt able to stand with min A to provide stability.   Ambulation/Gait Ambulation/Gait  assistance: Min assist Gait Distance (Feet): (varied distance with mutli task performance (20, 70, 60)) Assistive device: 1 person hand held assist(at times 2 person assist) Gait Pattern/deviations: Staggering left;Step-to pattern;Drifts right/left Gait velocity: decr   General Gait Details: Patient with noted instability and balance deviations during ambulation. Patient required hands on assist at all times with increased assist during LOB. Multiple LOB and instability noted when attempting cognitive path finding   Marine scientist Rankin (Stroke Patients Only) Modified Rankin (Stroke Patients Only) Pre-Morbid Rankin Score: No significant disability Modified Rankin: Moderately severe disability     Balance Overall balance assessment: Needs assistance Sitting-balance support: No upper extremity supported;Feet unsupported Sitting balance-Leahy Scale: Fair Sitting balance - Comments: able to sit EOB with min gaurd assist    Standing balance support: Single extremity supported Standing balance-Leahy Scale: Poor Standing balance comment: requires min A to maintain balance                             Cognition Arousal/Alertness: Awake/alert Behavior During Therapy: Flat affect;Impulsive Overall Cognitive Status: Impaired/Different from baseline Area of Impairment: Attention;Memory;Following commands;Safety/judgement;Problem solving;Awareness                   Current Attention Level: Sustained Memory: Decreased short-term memory Following Commands: Follows one step commands consistently;Follows multi-step commands inconsistently(with cues ) Safety/Judgement: Decreased awareness of safety   Problem Solving: Difficulty sequencing;Requires verbal cues;Requires tactile cues General Comments: Pt requires max cues to follow 2 step commands.  She is able to sustain attention to complete simple, familiar ADL tasks, but is easily  distracted when performing novel tasks and requires max cues to attend.   Pt internally distracted and will dead stop when ambulating and unable to divide attention to correct situation and continue to walk, or to move into a safe position (i.e. diaper slid down while walking, and pt stopped in hallway, lifted gown and attempted to manipulate diaper).        Exercises      General Comments General comments (skin integrity, edema, etc.): father present.  Discussed current deficits with him, and recommendation for CIR       Pertinent Vitals/Pain Pain Assessment: No/denies pain    Home Living Family/patient expects to be discharged to:: Private residence Living Arrangements: Parent Available Help at Discharge: Family;Available 24 hours/day Type of Home: House Home Access: Stairs to enter Entrance Stairs-Rails: Right;Left Home Layout: Two level;Bed/bath upstairs Home Equipment: Cane - quad;Other (comment);Walker - 2 wheels;Bedside commode;Shower seat;Wheelchair - Equities trader )      Prior Function Level of Independence: Independent      Comments: Pt was fully independent.  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    PT Goals (current goals can now be found in the care plan section) Acute Rehab PT Goals Patient Stated Goal: Pt wants to discharge home  PT Goal Formulation: With patient Time For Goal Achievement: 02/14/18 Potential to Achieve Goals: Good Progress towards PT goals: Progressing toward goals    Frequency    Min 4X/week      PT Plan Current plan remains appropriate    Co-evaluation              AM-PAC PT "6 Clicks" Mobility   Outcome Measure  Help needed turning from your back to your side while in a flat bed without using bedrails?: None Help needed moving from lying on your back to sitting on the side of a flat bed without using bedrails?: A Little Help needed moving to and from a bed to a chair (including a  wheelchair)?: A Little Help needed standing up from a chair using your arms (e.g., wheelchair or bedside chair)?: A Little Help needed to walk in hospital room?: A Lot Help needed climbing 3-5 steps with a railing? : A Lot 6 Click Score: 17    End of Session Equipment Utilized During Treatment: Gait belt Activity Tolerance: Patient tolerated treatment well;Patient limited by fatigue;Other (comment) Patient left: with call bell/phone within reach;with family/visitor present;in bed;with bed alarm set;with SCD's reapplied Nurse Communication: Mobility status PT Visit Diagnosis: Other abnormalities of gait and mobility (R26.89);Difficulty in walking, not elsewhere classified (R26.2)     Time: 1610-9604 PT Time Calculation (min) (ACUTE ONLY): 47 min  Charges:  $Gait Training: 8-22 mins                     Alben Deeds, PT DPT  Board Certified Neurologic Specialist [place] Pager [phone] Office [phone]    Duncan Dull 02/01/2018, 4:01 PM

## 2018-02-01 NOTE — Progress Notes (Signed)
STROKE TEAM PROGRESS NOTE   SUBJECTIVE (INTERVAL HISTORY) Her father is at the bedside. Pt sitting in bed for lunch, very good appetite. ID on board, did not think infection persisted at this time, but agree with continue antibiotics at this time. TEE pending. Blood Cx NGTD, afebrile. Left UE ataxia improving.   OBJECTIVE Vitals:   01/31/18 2336 02/01/18 0424 02/01/18 0909 02/01/18 1146  BP: (!) 150/90 (!) 151/86 (!) 150/97 (!) 151/87  Pulse: 89 85 (!) 101 97  Resp: 18 17 18 18   Temp: 98.2 F (36.8 C) 98.3 F (36.8 C) 97.8 F (36.6 C) 98.4 F (36.9 C)  TempSrc: Oral Oral Oral Oral  SpO2: 100% 100% 99% 100%  Weight:      Height:        CBC:  Recent Labs  Lab 01/30/18 0927 01/30/18 0934  WBC 10.4  --   NEUTROABS 8.5*  --   HGB 12.4 13.3  HCT 38.8 39.0  MCV 88.0  --   PLT 327  --     Basic Metabolic Panel:  Recent Labs  Lab 01/30/18 0927 01/30/18 0934  NA 136 136  K 3.5 3.6  CL 100 98  CO2 26  --   GLUCOSE 97 89  BUN 7 9  CREATININE 0.43* 0.20*  CALCIUM 8.8*  --     Lipid Panel:     Component Value Date/Time   CHOL 214 (H) 01/31/2018 0701   TRIG 84 01/31/2018 0701   HDL 45 01/31/2018 0701   CHOLHDL 4.8 01/31/2018 0701   VLDL 17 01/31/2018 0701   LDLCALC 152 (H) 01/31/2018 0701   HgbA1c:  Lab Results  Component Value Date   HGBA1C 5.1 01/31/2018   Urine Drug Screen: No results found for: LABOPIA, COCAINSCRNUR, LABBENZ, AMPHETMU, THCU, LABBARB  Alcohol Level No results found for: ETH  IMAGING   Ct Angio Head W Or Wo Contrast Ct Angio Neck W Or Wo Contrast Ct Cerebral Perfusion W Contrast 01/30/2018 IMPRESSION:  1. No focal ischemia or tissue at risk.  Normal CT brain perfusion.  2. No emergent large vessel occlusion.  3. Medium and small vessel irregularity is noted within the anterior circulation. Vessels are small. Question underlying vasculitis.  4. Tortuosity of the distal cervical left ICA. There is no significant stenosis. This is  commonly seen in the setting of hypertension.  5. Scoliosis.    Mr Brain Wo Contrast 01/30/2018 IMPRESSION:  Scattered diffusion abnormality overlying the right cerebral convexity as well as the suprasellar cistern, most concerning for pial exudate related to meningitis given provided history. Associated scattered multifocal acute to early subacute ischemic infarcts as detailed above. Specific note made of diffusion abnormality at the ventral right thalamus/internal capsule, which could account for patient's left-sided symptoms. Correlation with CSF analysis suggested.    Mr Jeri Cos Contrast 01/30/2018 IMPRESSION:  Scattered leptomeningeal enhancement overlying the right cerebral convexity and about the perimesencephalic and supra cerebellar cisterns, most consistent with meningitis. Associated patchy post-contrast enhancement within the bilateral cerebral hemispheres and cerebellum as above, compatible with associated cerebritis. No frank intracerebral abscess or empyema at this time.    Ct Head Code Stroke Wo Contrast 01/30/2018 IMPRESSION:  1. Normal CT appearance of the brain.  2. Postsurgical changes of the anterior maxillary sinuses and nasal bones.  3. Stable chronic sinus opacification.  4. ASPECTS is 10/10     PHYSICAL EXAM  Temp:  [97.8 F (36.6 C)-98.5 F (36.9 C)] 98.4 F (36.9 C) (01/13 1146) Pulse  Rate:  [85-101] 97 (01/13 1146) Resp:  [15-18] 18 (01/13 1146) BP: (149-158)/(86-97) 151/87 (01/13 1146) SpO2:  [98 %-100 %] 100 % (01/13 1146) Weight:  [50.8 kg] 50.8 kg (01/12 2330)  General - Well nourished, well developed, in no apparent distress.  Ophthalmologic - fundi not visualized due to noncooperation.  Cardiovascular - Regular rate and rhythm.  Neuro - awake alert, no dysarthria or aphasia, however difficulty with orientation questions due to total hair loss, however able to follow simple written commands.  PERRL, EOMI, blinking to visual threat bilaterally,  facial symmetrical, tongue midline.  Moving all extremities symmetrically, however has left finger-to-nose mild ataxia but improved from yesterday.  Sensation symmetrical.  Gait not tested.   ASSESSMENT/PLAN Emma Martin is a 38 y.o. 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 She did not receive IV t-PA due to risks felt to outweigh benefits.  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   MRI Brain - Multiple infarcts involving right thalamus/IC, left SCA territory, and right semiovale  CTA H&N -  Unremarkable   CT perfusion - normal CT brain perfusion.     Pending TEE in am  LDL - 152   HgbA1c - 5.1  VTE prophylaxis - SCDs  Diet  - Heart healthy with thin liquids.  No antithrombotic prior to admission, now on No antithrombotic.  No antiplatelet indicated for septic emboli due to endocarditis  Ongoing aggressive antibiotic treatment  Therapy recommendations:  pending  Disposition:  Pending  Strep pneumo meningitis  Patient recently admitted on 01/15/2018 for strep pneumo bacteremia and meningitis, CSF consistent with meningitis, patient was put on vancomycin and Rocephin.  TEE 01/19/2018 showed no vegetation  Has been on Vanco and Rocephin at home and to admission  MRI Brain with contrast this time - Scattered leptomeningeal enhancement overlying the right cerebral convexity and about the perimesencephalic and supra cerebellar cisterns, most consistent with meningitis. Associated patchy post-contrast enhancement within the bilateral cerebral hemispheres and cerebellum as above,  compatible with associated cerebritis.  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 Long-term BP goal normotensive  Hyperlipidemia  Lipid lowering medication PTA:  none  LDL 152, goal < 70  Current lipid lowering medication: Lipitor 20  Continue statin at discharge  Other Stroke Risk Factors    Other Active Problems  Turner syndrome  Depression on Prozac  Hospital day # 2  Rosalin Hawking, MD PhD Stroke Neurology 02/01/2018 12:57 PM   To contact Stroke Continuity provider, please refer to [email or web address] After hours, contact General Neurology

## 2018-02-01 NOTE — H&P (View-Only) (Signed)
STROKE TEAM PROGRESS NOTE   SUBJECTIVE (INTERVAL HISTORY) Her father is at the bedside. Pt sitting in bed for lunch, very good appetite. ID on board, did not think infection persisted at this time, but agree with continue antibiotics at this time. TEE pending. Blood Cx NGTD, afebrile. Left UE ataxia improving.   OBJECTIVE Vitals:   01/31/18 2336 02/01/18 0424 02/01/18 0909 02/01/18 1146  BP: (!) 150/90 (!) 151/86 (!) 150/97 (!) 151/87  Pulse: 89 85 (!) 101 97  Resp: 18 17 18 18   Temp: 98.2 F (36.8 C) 98.3 F (36.8 C) 97.8 F (36.6 C) 98.4 F (36.9 C)  TempSrc: Oral Oral Oral Oral  SpO2: 100% 100% 99% 100%  Weight:      Height:        CBC:  Recent Labs  Lab 01/30/18 0927 01/30/18 0934  WBC 10.4  --   NEUTROABS 8.5*  --   HGB 12.4 13.3  HCT 38.8 39.0  MCV 88.0  --   PLT 327  --     Basic Metabolic Panel:  Recent Labs  Lab 01/30/18 0927 01/30/18 0934  NA 136 136  K 3.5 3.6  CL 100 98  CO2 26  --   GLUCOSE 97 89  BUN 7 9  CREATININE 0.43* 0.20*  CALCIUM 8.8*  --     Lipid Panel:     Component Value Date/Time   CHOL 214 (H) 01/31/2018 0701   TRIG 84 01/31/2018 0701   HDL 45 01/31/2018 0701   CHOLHDL 4.8 01/31/2018 0701   VLDL 17 01/31/2018 0701   LDLCALC 152 (H) 01/31/2018 0701   HgbA1c:  Lab Results  Component Value Date   HGBA1C 5.1 01/31/2018   Urine Drug Screen: No results found for: LABOPIA, COCAINSCRNUR, LABBENZ, AMPHETMU, THCU, LABBARB  Alcohol Level No results found for: ETH  IMAGING   Ct Angio Head W Or Wo Contrast Ct Angio Neck W Or Wo Contrast Ct Cerebral Perfusion W Contrast 01/30/2018 IMPRESSION:  1. No focal ischemia or tissue at risk.  Normal CT brain perfusion.  2. No emergent large vessel occlusion.  3. Medium and small vessel irregularity is noted within the anterior circulation. Vessels are small. Question underlying vasculitis.  4. Tortuosity of the distal cervical left ICA. There is no significant stenosis. This is  commonly seen in the setting of hypertension.  5. Scoliosis.    Mr Brain Wo Contrast 01/30/2018 IMPRESSION:  Scattered diffusion abnormality overlying the right cerebral convexity as well as the suprasellar cistern, most concerning for pial exudate related to meningitis given provided history. Associated scattered multifocal acute to early subacute ischemic infarcts as detailed above. Specific note made of diffusion abnormality at the ventral right thalamus/internal capsule, which could account for patient's left-sided symptoms. Correlation with CSF analysis suggested.    Mr Jeri Cos Contrast 01/30/2018 IMPRESSION:  Scattered leptomeningeal enhancement overlying the right cerebral convexity and about the perimesencephalic and supra cerebellar cisterns, most consistent with meningitis. Associated patchy post-contrast enhancement within the bilateral cerebral hemispheres and cerebellum as above, compatible with associated cerebritis. No frank intracerebral abscess or empyema at this time.    Ct Head Code Stroke Wo Contrast 01/30/2018 IMPRESSION:  1. Normal CT appearance of the brain.  2. Postsurgical changes of the anterior maxillary sinuses and nasal bones.  3. Stable chronic sinus opacification.  4. ASPECTS is 10/10     PHYSICAL EXAM  Temp:  [97.8 F (36.6 C)-98.5 F (36.9 C)] 98.4 F (36.9 C) (01/13 1146) Pulse  Rate:  [85-101] 97 (01/13 1146) Resp:  [15-18] 18 (01/13 1146) BP: (149-158)/(86-97) 151/87 (01/13 1146) SpO2:  [98 %-100 %] 100 % (01/13 1146) Weight:  [50.8 kg] 50.8 kg (01/12 2330)  General - Well nourished, well developed, in no apparent distress.  Ophthalmologic - fundi not visualized due to noncooperation.  Cardiovascular - Regular rate and rhythm.  Neuro - awake alert, no dysarthria or aphasia, however difficulty with orientation questions due to total hair loss, however able to follow simple written commands.  PERRL, EOMI, blinking to visual threat bilaterally,  facial symmetrical, tongue midline.  Moving all extremities symmetrically, however has left finger-to-nose mild ataxia but improved from yesterday.  Sensation symmetrical.  Gait not tested.   ASSESSMENT/PLAN Ms. Emma Martin is a 38 y.o. 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 She did not receive IV t-PA due to risks felt to outweigh benefits.  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   MRI Brain - Multiple infarcts involving right thalamus/IC, left SCA territory, and right semiovale  CTA H&N -  Unremarkable   CT perfusion - normal CT brain perfusion.     Pending TEE in am  LDL - 152   HgbA1c - 5.1  VTE prophylaxis - SCDs  Diet  - Heart healthy with thin liquids.  No antithrombotic prior to admission, now on No antithrombotic.  No antiplatelet indicated for septic emboli due to endocarditis  Ongoing aggressive antibiotic treatment  Therapy recommendations:  pending  Disposition:  Pending  Strep pneumo meningitis  Patient recently admitted on 01/15/2018 for strep pneumo bacteremia and meningitis, CSF consistent with meningitis, patient was put on vancomycin and Rocephin.  TEE 01/19/2018 showed no vegetation  Has been on Vanco and Rocephin at home and to admission  MRI Brain with contrast this time - Scattered leptomeningeal enhancement overlying the right cerebral convexity and about the perimesencephalic and supra cerebellar cisterns, most consistent with meningitis. Associated patchy post-contrast enhancement within the bilateral cerebral hemispheres and cerebellum as above,  compatible with associated cerebritis.  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 Long-term BP goal normotensive  Hyperlipidemia  Lipid lowering medication PTA:  none  LDL 152, goal < 70  Current lipid lowering medication: Lipitor 20  Continue statin at discharge  Other Stroke Risk Factors    Other Active Problems  Turner syndrome  Depression on Prozac  Hospital day # 2  Rosalin Hawking, MD PhD Stroke Neurology 02/01/2018 12:57 PM   To contact Stroke Continuity provider, please refer to [email or web address] After hours, contact General Neurology

## 2018-02-01 NOTE — Progress Notes (Signed)
[facility]  Patient Status: Active (receiving services up to time of hospitalization)  [facility] is providing the following services: RN and PT  If patient discharges after hours, please call [phone].   Emma Martin 02/01/2018, 10:41 AM

## 2018-02-01 NOTE — Progress Notes (Signed)
[facility]  [facility] has been providing Home [facility]fusion [facility] services and home health services for patient.  Janaya completed her IV ABX  On 01/29/18. We will follow to support DC and home services when ordered/needed.  If patient discharges after hours, please call [phone].   Larry Sierras 02/01/2018, 7:23 AM

## 2018-02-02 ENCOUNTER — Encounter (HOSPITAL_COMMUNITY)
Admission: EM | Disposition: A | Discharge status: Rehabilitation Facility | Payer: Self-pay | Source: Home / Self Care | Attending: Family Medicine

## 2018-02-02 ENCOUNTER — Encounter (HOSPITAL_COMMUNITY): Payer: Self-pay | Admitting: *Deleted

## 2018-02-02 ENCOUNTER — Inpatient Hospital Stay (HOSPITAL_COMMUNITY): Payer: Medicaid Other

## 2018-02-02 DIAGNOSIS — Z888 Allergy status to other drugs, medicaments and biological substances status: Secondary | ICD-10-CM

## 2018-02-02 DIAGNOSIS — I351 Nonrheumatic aortic (valve) insufficiency: Secondary | ICD-10-CM

## 2018-02-02 DIAGNOSIS — H903 Sensorineural hearing loss, bilateral: Secondary | ICD-10-CM

## 2018-02-02 DIAGNOSIS — I340 Nonrheumatic mitral (valve) insufficiency: Secondary | ICD-10-CM

## 2018-02-02 DIAGNOSIS — G8194 Hemiplegia, unspecified affecting left nondominant side: Secondary | ICD-10-CM

## 2018-02-02 DIAGNOSIS — I639 Cerebral infarction, unspecified: Secondary | ICD-10-CM

## 2018-02-02 HISTORY — PX: TEE WITHOUT CARDIOVERSION: SHX5443

## 2018-02-02 LAB — BASIC METABOLIC PANEL
Anion gap: 10 (ref 5–15)
BUN: 9 mg/dL (ref 6–20)
CO2: 27 mmol/L (ref 22–32)
Calcium: 8.5 mg/dL — ABNORMAL LOW (ref 8.9–10.3)
Chloride: 96 mmol/L — ABNORMAL LOW (ref 98–111)
Creatinine, Ser: 0.48 mg/dL (ref 0.44–1.00)
GFR calc Af Amer: >60 mL/min (ref >60)
GFR calc non Af Amer: >60 mL/min (ref >60)
Glucose, Bld: 101 mg/dL — ABNORMAL HIGH (ref 70–99)
Potassium: 3.3 mmol/L — ABNORMAL LOW (ref 3.5–5.1)
Sodium: 133 mmol/L — ABNORMAL LOW (ref 135–145)

## 2018-02-02 SURGERY — ECHOCARDIOGRAM, TRANSESOPHAGEAL
Anesthesia: Moderate Sedation

## 2018-02-02 MED ORDER — FENTANYL CITRATE (PF) 100 MCG/2ML IJ SOLN
INTRAMUSCULAR | Status: DC | PRN
  Administered 2018-02-02 (×2): 25 ug via INTRAVENOUS

## 2018-02-02 MED ORDER — SODIUM CHLORIDE 0.9 % IV SOLN: INTRAVENOUS | Status: DC

## 2018-02-02 MED ORDER — FENTANYL CITRATE (PF) 100 MCG/2ML IJ SOLN
INTRAMUSCULAR | Status: AC
  Filled 2018-02-02: qty 2

## 2018-02-02 MED ORDER — MIDAZOLAM HCL (PF) 5 MG/ML IJ SOLN
INTRAMUSCULAR | Status: AC
  Filled 2018-02-02: qty 2

## 2018-02-02 MED ORDER — MIDAZOLAM HCL (PF) 10 MG/2ML IJ SOLN
INTRAMUSCULAR | Status: DC | PRN
  Administered 2018-02-02: 2 mg via INTRAVENOUS
  Administered 2018-02-02 (×2): 1 mg via INTRAVENOUS

## 2018-02-02 NOTE — Progress Notes (Signed)
SLP Cancellation Note  Patient Details Name: Emma Martin MRN: [id number] DOB: Mar 21, 1980   Cancelled treatment:       Reason Eval/Treat Not Completed: Fatigue/lethargy limiting ability to participate(Pt back from TEE, continued lethargy prohibiting assessment)   Hudsen Fei 02/02/2018, 12:53 PM

## 2018-02-02 NOTE — Progress Notes (Addendum)
[facility]  Date of Admission:  01/30/2018   Total days of antibiotics 18        Day 18 Vancomycin        Day 18 Ceftriaxone         ASSESSMENT: Emma Martin is a 38 year old female with a history of Turner's syndrome, Bicuspid aortic valve, Hypothyroidism, Hypertension, and recent admission on 12/27 for meningitis with bacteremia, which was complicated by bilateral hearing loss (for which she has ENT follow up) who presented on 1/11 with left sided weakness. She was found to have multiple infarcts concerning for embolic source. 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 TEE again negative for vegetation earlier today. Given the lack of an alternative explanation for her embolic CVA, will continue to treat patient for possible endocarditis. She may have developed a small vegetation since her previous TEE that embolized and broke apart causing her stroke. Will need 6 weeks total of antibiotic therapy (through 02/26/18).  PLAN:  1. CVA, Bacteremia, Presumed Endocarditis  - Continue Vancomycin and Ceftriaxone through 02/26/18  Principal Problem:   Acute ischemic stroke [facility]) Active Problems:   Bicuspid aortic valve   Pneumococcal bacteremia   Pneumococcal meningitis   Sensorineural hearing loss (SNHL) of both ears   HTN (hypertension)   Hypothyroidism   Generalized anxiety disorder   Turner syndrome   Dyslipidemia   Cerebrovascular accident (CVA) ([place])   Benign essential HTN   Tachypnea   Scheduled Meds: .  stroke: mapping our early stages of recovery book   Does not apply Once  . atorvastatin  20 mg Oral q1800  . FLUoxetine  40 mg Oral Daily  . fluticasone  2 spray Each Nare Daily  . levothyroxine  75 mcg Oral QAC breakfast   Continuous Infusions: . cefTRIAXone (ROCEPHIN)  IV 2 g (02/02/18 1221)  . vancomycin 1,250 mg (02/02/18 0626)   PRN Meds:.acetaminophen **OR** acetaminophen (TYLENOL) oral liquid 160 mg/5 mL **OR**  acetaminophen, senna-docusate   SUBJECTIVE: Emma Martin is awake and alert this afternoon. She indicated that she had no questions or complaints when asked via written questions.  Review of Systems: Review of Systems  Constitutional: Negative for fever.    Allergies  Allergen Reactions  . Benzoin Rash    OBJECTIVE: Vitals:   02/02/18 1145 02/02/18 1150 02/02/18 1200 02/02/18 1205  BP: (!) 164/107 (!) 148/105 (!) 149/118 (!) 150/100  Pulse: 93 88 92 91  Resp: 13 13 15 15   Temp:   98.7 F (37.1 C)   TempSrc:   Oral   SpO2: 100% 99% 97% 96%  Weight:      Height:       Body mass index is 21.16 kg/m.  Physical Exam Constitutional:      General: She is not in acute distress. Cardiovascular:     Rate and Rhythm: Normal rate and regular rhythm.     Heart sounds: Normal heart sounds.  Pulmonary:     Effort: Pulmonary effort is normal. No respiratory distress.     Breath sounds: Normal breath sounds.  Abdominal:     General: Abdomen is flat. Bowel sounds are normal. There is no distension.     Palpations: Abdomen is soft.     Tenderness: There is no abdominal tenderness.  Musculoskeletal:        General: No swelling, tenderness or deformity.  Skin:    General: Skin is warm and dry.  Neurological:  General: No focal deficit present.     Mental Status: She is alert. Mental status is at baseline.     Lab Results Lab Results  Component Value Date   WBC 10.4 01/30/2018   HGB 13.3 01/30/2018   HCT 39.0 01/30/2018   MCV 88.0 01/30/2018   PLT 327 01/30/2018    Lab Results  Component Value Date   CREATININE 0.48 02/02/2018   BUN 9 02/02/2018   NA 133 (L) 02/02/2018   K 3.3 (L) 02/02/2018   CL 96 (L) 02/02/2018   CO2 27 02/02/2018    Lab Results  Component Value Date   ALT 386 (H) 01/30/2018   AST 184 (H) 01/30/2018   ALKPHOS 216 (H) 01/30/2018   BILITOT 0.9 01/30/2018     Microbiology: Recent Results (from the past 240 hour(s))  Culture, blood  (routine x 2)     Status: None (Preliminary result)   Collection Time: 01/30/18  5:25 PM  Result Value Ref Range Status   Specimen Description BLOOD LEFT FOREARM  Final   Special Requests   Final    BOTTLES DRAWN AEROBIC AND ANAEROBIC Blood Culture adequate volume   Culture   Final    NO GROWTH 3 DAYS Performed at [facility], [place] [address], [place], [place] [address]    Report Status PENDING  Incomplete  Culture, blood (routine x 2)     Status: None (Preliminary result)   Collection Time: 01/30/18  5:28 PM  Result Value Ref Range Status   Specimen Description BLOOD LEFT ANTECUBITAL  Final   Special Requests   Final    BOTTLES DRAWN AEROBIC AND ANAEROBIC Blood Culture adequate volume   Culture   Final    NO GROWTH 3 DAYS Performed at [facility], [place] [address] [place], [place] [address]    Report Status PENDING  Incomplete  MRSA PCR Screening     Status: None   Collection Time: 01/30/18  6:02 PM  Result Value Ref Range Status   MRSA by PCR NEGATIVE NEGATIVE Final    Comment:        The GeneXpert MRSA Assay (FDA approved for NASAL specimens only), is one component of a comprehensive MRSA colonization surveillance program. It is not intended to diagnose MRSA infection nor to guide or monitor treatment for MRSA infections. Performed at [facility], [place] [address] [place], [place] [address]     Neva Seat, MD IM PGY-2 [phone] Attending: [phone] pager   [phone] cell 02/02/2018, 2:21 PM

## 2018-02-02 NOTE — Progress Notes (Signed)
  Echocardiogram Echocardiogram Transesophageal has been performed.  Emma Martin L Androw 02/02/2018, 12:10 PM

## 2018-02-02 NOTE — Progress Notes (Signed)
PT Cancellation Note  Patient Details Name: Emma Martin MRN: [id number] DOB: 24-Feb-1980   Cancelled Treatment:    Reason Eval/Treat Not Completed: Patient at procedure or test/unavailable   Duncan Dull 02/02/2018, 10:35 AM

## 2018-02-02 NOTE — CV Procedure (Signed)
INDICATIONS: stroke  PROCEDURE:   Informed consent was obtained prior to the procedure. The risks, benefits and alternatives for the procedure were discussed and the patient comprehended these risks.  Risks include, but are not limited to, cough, sore throat, vomiting, nausea, somnolence, esophageal and stomach trauma or perforation, bleeding, low blood pressure, aspiration, pneumonia, infection, trauma to the teeth and death.    After a procedural time-out, the oropharynx was anesthetized with 20% benzocaine spray.   During this procedure the patient was administered a total of Versed 4 mg and Fentanyl 75 mg to achieve and maintain moderate conscious sedation.  The patient's heart rate, blood pressure, and oxygen saturationweare monitored continuously during the procedure. The period of conscious sedation was 30 minutes, of which I was present face-to-face 100% of this time.  The transesophageal probe was inserted in the esophagus and stomach without difficulty and multiple views were obtained.  The patient was kept under observation until the patient left the procedure room.  The patient left the procedure room in stable condition.   Agitated microbubble saline contrast was administered.  COMPLICATIONS:    There were no immediate complications.  FINDINGS:  Mild-moderate aortic valve regurgitation Bicuspid aortic valve with R-L cusp fusion Mild MR. Normal biventricular function.  LA appendage poorly visualized but where seen appears free of clot.  No vegetations or thrombi seen.  RECOMMENDATIONS:    Can return to hospital room.  Time Spent Directly with the Patient:  60 minutes   Emma Martin 02/02/2018, 11:53 AM

## 2018-02-02 NOTE — Progress Notes (Signed)
PROGRESS NOTE    Emma Martin  JSE:[id number] DOB: July 17, 1980 DOA: 01/30/2018 PCP: Emma Small, MD   Brief Narrative: Emma Martin is a 38 y.o. female with a history of hypertension, hypothyroidism, Turner syndrome, scoliosis status post Harrington rods, bicuspid aortic valve, streptococcal meningitis and bacteremia. 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   Assessment & Plan:   Principal Problem:   Acute ischemic stroke [facility]) Active Problems:   Bicuspid aortic valve   HTN (hypertension)   Hypothyroidism   Generalized anxiety disorder   Pneumococcal bacteremia   Pneumococcal meningitis   Turner syndrome   Sensorineural hearing loss (SNHL) of both ears   Dyslipidemia   Cerebrovascular accident (CVA) (Emma Martin)   Benign essential HTN   Tachypnea   Acute ischemic stroke Likely embolic. Concern for septic emboli in setting of recent strep pneumo bacteremia and meningitis.  Patient has a recent TEE without evidence of vegetations.  Blood cultures prior to discharge were no growth. Repeat Transesophageal Echocardiogram (1/14) without evidence of vegetation. Patient with some intermittent confusion. -Neurology recommendations: No antiplatelet therapy -ID recommendations: IV Vancomycin/ceftriaxone -Continue vancomycin/ceftriaxone for now  History of streptococcal pneumonia bacteremia/meningitis Treated with 2 weeks of vancomycin and ceftriaxone.  Work-up as mentioned above.  Completed IV course on January 10.  Hearing loss Complete hearing loss secondary to history of streptococcal pneumonia meningitis.  Patient has outpatient plans for Emma Martin ENT follow-up  Essential hypertension Patient is on lisinopril an outpatient.  Currently not resumed in setting of permissive hypertension.  Hypothyroidism -Continue Synthroid  Mood disorder -Continue Prozac  Bicuspid  valve Murmur present  DVT prophylaxis: SCDs Code Status:   Code Status: Full Code Family Communication: Father and uncle at bedside Disposition Plan: Discharge pending stroke work-up and ID work-up   Consultants:   Neurology  Infectious disease  Procedures:   None  Antimicrobials:  Vancomycin  Ceftriaxone   Subjective: Back from Transesophageal Echocardiogram. Lethargic from sedation. Unable to obtain subjective from patient. Per father, patient more interactive yesterday afternoon. Did well with physical therapy yesterday as well with regard to ambulation. Continues to sleep a lot. Has also been asking about her late mother.  Objective: Vitals:   02/02/18 1150 02/02/18 1200 02/02/18 1205 02/02/18 1547  BP: (!) 148/105 (!) 149/118 (!) 150/100 (!) 141/78  Pulse: 88 92 91 92  Resp: 13 15 15 16   Temp:  98.7 F (37.1 C)  97.8 F (36.6 C)  TempSrc:  Oral  Axillary  SpO2: 99% 97% 96% 100%  Weight:      Height:        Intake/Output Summary (Last 24 hours) at 02/02/2018 1736 Last data filed at 02/02/2018 1500 Gross per 24 hour  Intake 1257.01 ml  Output 1200 ml  Net 57.01 ml   Filed Weights   01/30/18 1018 01/31/18 2330  Weight: 49.7 kg 50.8 kg    Examination:  General exam: Appears calm and comfortable Respiratory system: Clear to auscultation. Respiratory effort normal. Cardiovascular system: S1 & S2 heard, RRR. 2/6 murmurs Gastrointestinal system: Abdomen is nondistended, soft and nontender. No organomegaly or masses felt. Normal bowel sounds heard. Central nervous system: Sleeping Extremities: No edema. No calf tenderness Skin: No cyanosis. No rashes    Data Reviewed: I have personally reviewed following labs and imaging studies  CBC: Recent Labs  Lab 01/30/18 0927 01/30/18 0934  WBC 10.4  --   NEUTROABS 8.5*  --  HGB 12.4 13.3  HCT 38.8 39.0  MCV 88.0  --   PLT 327  --    Basic Metabolic Panel: Recent Labs  Lab 01/30/18 0927  01/30/18 0934 02/02/18 0441  NA 136 136 133*  K 3.5 3.6 3.3*  CL 100 98 96*  CO2 26  --  27  GLUCOSE 97 89 101*  BUN 7 9 9   CREATININE 0.43* 0.20* 0.48  CALCIUM 8.8*  --  8.5*   GFR: Estimated Creatinine Clearance: 73.4 mL/min (by C-G formula based on SCr of 0.48 mg/dL). Liver Function Tests: Recent Labs  Lab 01/30/18 0927  AST 184*  ALT 386*  ALKPHOS 216*  BILITOT 0.9  PROT 6.8  ALBUMIN 2.9*   No results for input(s): LIPASE, AMYLASE in the last 168 hours. No results for input(s): AMMONIA in the last 168 hours. Coagulation Profile: Recent Labs  Lab 01/30/18 0927  INR 1.11   Cardiac Enzymes: No results for input(s): CKTOTAL, CKMB, CKMBINDEX, TROPONINI in the last 168 hours. BNP (last 3 results) No results for input(s): PROBNP in the last 8760 hours. HbA1C: Recent Labs    01/31/18 0701  HGBA1C 5.1   CBG: Recent Labs  Lab 01/30/18 0928  GLUCAP 76   Lipid Profile: Recent Labs    01/31/18 0701  CHOL 214*  HDL 45  LDLCALC 152*  TRIG 84  CHOLHDL 4.8   Thyroid Function Tests: No results for input(s): TSH, T4TOTAL, FREET4, T3FREE, THYROIDAB in the last 72 hours. Anemia Panel: No results for input(s): VITAMINB12, FOLATE, FERRITIN, TIBC, IRON, RETICCTPCT in the last 72 hours. Sepsis Labs: No results for input(s): PROCALCITON, LATICACIDVEN in the last 168 hours.  Recent Results (from the past 240 hour(s))  Culture, blood (routine x 2)     Status: None (Preliminary result)   Collection Time: 01/30/18  5:25 PM  Result Value Ref Range Status   Specimen Description BLOOD LEFT FOREARM  Final   Special Requests   Final    BOTTLES DRAWN AEROBIC AND ANAEROBIC Blood Culture adequate volume   Culture   Final    NO GROWTH 3 DAYS Performed at [facility], [address] [place], [place] [address]    Report Status PENDING  Incomplete  Culture, blood (routine x 2)     Status: None (Preliminary result)   Collection Time: 01/30/18  5:28 PM  Result Value  Ref Range Status   Specimen Description BLOOD LEFT ANTECUBITAL  Final   Special Requests   Final    BOTTLES DRAWN AEROBIC AND ANAEROBIC Blood Culture adequate volume   Culture   Final    NO GROWTH 3 DAYS Performed at [facility], [place] [address] [place], [place] [address]    Report Status PENDING  Incomplete  MRSA PCR Screening     Status: None   Collection Time: 01/30/18  6:02 PM  Result Value Ref Range Status   MRSA by PCR NEGATIVE NEGATIVE Final    Comment:        The GeneXpert MRSA Assay (FDA approved for NASAL specimens only), is one component of a comprehensive MRSA colonization surveillance program. It is not intended to diagnose MRSA infection nor to guide or monitor treatment for MRSA infections. Performed at [facility], [place] [address], [place], [place] [address]          Radiology Studies: No results found.      Scheduled Meds: .  stroke: mapping our early stages of recovery book  Does not apply Once  . atorvastatin  20 mg Oral q1800  . FLUoxetine  40 mg Oral Daily  . fluticasone  2 spray Each Nare Daily  . levothyroxine  75 mcg Oral QAC breakfast   Continuous Infusions: . cefTRIAXone (ROCEPHIN)  IV 2 g (02/02/18 1221)  . vancomycin 1,250 mg (02/02/18 1425)     LOS: 3 days     Cordelia Poche, MD [facility] 02/02/2018, 5:36 PM  If 7PM-7AM, please contact night-coverage [email or web address]

## 2018-02-02 NOTE — Interval H&P Note (Signed)
History and Physical Interval Note:  02/02/2018 11:00 AM  Emma Martin  has presented today for surgery, with the diagnosis of stroke  The various methods of treatment have been discussed with the patient and family. After consideration of risks, benefits and other options for treatment, the patient has consented to  Procedure(s): TRANSESOPHAGEAL ECHOCARDIOGRAM (TEE) (N/A) as a surgical intervention .  The patient's history has been reviewed, patient examined, no change in status, stable for surgery.  I have reviewed the patient's chart and labs.  Questions were answered to the patient's satisfaction.     Elouise Munroe

## 2018-02-02 NOTE — Progress Notes (Signed)
Patient ID: LUCIE FRIEDLANDER, female   DOB: 1980/07/12, 38 y.o.   MRN: [id number]          [facility] for Infectious Disease    Date of Admission:  01/30/2018   Total days of antibiotics 18         Ms. Sones was recently hospitalized with bacteremic pneumococcal meningitis.  Her isolate was intermediately susceptible to ceftriaxone so, according to published guidelines, she was treated with a combination of ceftriaxone and vancomycin for 14 days.  Unfortunately her meningitis caused complete sensorineural hearing loss.  She was readmitted 5 days ago with lethargy and acute left-sided weakness.  Brain MRI revealed ischemic infarcts in multiple vascular territories compatible with cardiac emboli.  She has a bicuspid aortic valve that puts her at high risk for endocarditis.  No vegetations or acute valvular abnormalities were noted on TEE done on 01/19/2018 and again today.  However we do not have a better, alternative explanation for her stroke than the possibility of an embolized valvular vegetation.  I favor continuing antibiotic therapy for now.  I have discussed this difficult situation with her father and he is in agreement.  I plan on 6 weeks of total antibiotic therapy through 02/26/2018.         Michel Bickers, MD [facility] for Infectious [facility] [phone] pager   [phone] cell 02/02/2018, 1:09 PM

## 2018-02-02 NOTE — Progress Notes (Signed)
[facility] Infusion Coordinator will follow [name]'s hospital course with ID team to support Home Infusion Pharmacy services at [place] for home IV ABX.  Emma Martin just completed her original IV ABX regimen at home on 01/29/18 just prior to this admission.  If patient discharges after hours, please call [phone].   Larry Sierras 02/02/2018, 3:07 PM

## 2018-02-02 NOTE — Progress Notes (Signed)
STROKE TEAM PROGRESS NOTE   SUBJECTIVE (INTERVAL HISTORY) Her father is at the bedside. Pt had TEE and showed no endocarditis.   OBJECTIVE Vitals:   02/02/18 1145 02/02/18 1150 02/02/18 1200 02/02/18 1205  BP: (!) 164/107 (!) 148/105 (!) 149/118 (!) 150/100  Pulse: 93 88 92 91  Resp: 13 13 15 15   Temp:   98.7 F (37.1 C)   TempSrc:   Oral   SpO2: 100% 99% 97% 96%  Weight:      Height:        CBC:  Recent Labs  Lab 01/30/18 0927 01/30/18 0934  WBC 10.4  --   NEUTROABS 8.5*  --   HGB 12.4 13.3  HCT 38.8 39.0  MCV 88.0  --   PLT 327  --     Basic Metabolic Panel:  Recent Labs  Lab 01/30/18 0927 01/30/18 0934 02/02/18 0441  NA 136 136 133*  K 3.5 3.6 3.3*  CL 100 98 96*  CO2 26  --  27  GLUCOSE 97 89 101*  BUN 7 9 9   CREATININE 0.43* 0.20* 0.48  CALCIUM 8.8*  --  8.5*    Lipid Panel:     Component Value Date/Time   CHOL 214 (H) 01/31/2018 0701   TRIG 84 01/31/2018 0701   HDL 45 01/31/2018 0701   CHOLHDL 4.8 01/31/2018 0701   VLDL 17 01/31/2018 0701   LDLCALC 152 (H) 01/31/2018 0701   HgbA1c:  Lab Results  Component Value Date   HGBA1C 5.1 01/31/2018   Urine Drug Screen: No results found for: LABOPIA, COCAINSCRNUR, LABBENZ, AMPHETMU, THCU, LABBARB  Alcohol Level No results found for: ETH  IMAGING   Ct Angio Head W Or Wo Contrast Ct Angio Neck W Or Wo Contrast Ct Cerebral Perfusion W Contrast 01/30/2018 IMPRESSION:  1. No focal ischemia or tissue at risk.  Normal CT brain perfusion.  2. No emergent large vessel occlusion.  3. Medium and small vessel irregularity is noted within the anterior circulation. Vessels are small. Question underlying vasculitis.  4. Tortuosity of the distal cervical left ICA. There is no significant stenosis. This is commonly seen in the setting of hypertension.  5. Scoliosis.    Mr Brain Wo Contrast 01/30/2018 IMPRESSION:  Scattered diffusion abnormality overlying the right cerebral convexity as well as the  suprasellar cistern, most concerning for pial exudate related to meningitis given provided history. Associated scattered multifocal acute to early subacute ischemic infarcts as detailed above. Specific note made of diffusion abnormality at the ventral right thalamus/internal capsule, which could account for patient's left-sided symptoms. Correlation with CSF analysis suggested.    Mr Jeri Cos Contrast 01/30/2018 IMPRESSION:  Scattered leptomeningeal enhancement overlying the right cerebral convexity and about the perimesencephalic and supra cerebellar cisterns, most consistent with meningitis. Associated patchy post-contrast enhancement within the bilateral cerebral hemispheres and cerebellum as above, compatible with associated cerebritis. No frank intracerebral abscess or empyema at this time.    Ct Head Code Stroke Wo Contrast 01/30/2018 IMPRESSION:  1. Normal CT appearance of the brain.  2. Postsurgical changes of the anterior maxillary sinuses and nasal bones.  3. Stable chronic sinus opacification.  4. ASPECTS is 10/10     PHYSICAL EXAM  Temp:  [98 F (36.7 C)-98.7 F (37.1 C)] 98.7 F (37.1 C) (01/14 1200) Pulse Rate:  [82-117] 91 (01/14 1205) Resp:  [13-18] 15 (01/14 1205) BP: (138-178)/(90-118) 150/100 (01/14 1205) SpO2:  [96 %-100 %] 96 % (01/14 1205)  General - Well nourished,  well developed, in no apparent distress.  Ophthalmologic - fundi not visualized due to noncooperation.  Cardiovascular - Regular rate and rhythm.  Neuro - awake alert, no dysarthria or aphasia, however difficulty with orientation questions due to total hair loss, however able to follow simple written commands.  PERRL, EOMI, blinking to visual threat bilaterally, facial symmetrical, tongue midline.  Moving all extremities symmetrically, however has left finger-to-nose mild ataxia but improved from yesterday.  Sensation symmetrical.  Gait not tested.   ASSESSMENT/PLAN Ms. Emma Martin is a 38  y.o. 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 She did not receive IV t-PA due to risks felt to outweigh benefits.  Strokes: Multiple infarcts involving right thalamus/IC, left SCA territory, and right semiovale, concerning for infections vasculitis vs. septic emboli from endocarditis  CT head - normal CT appearance of the brain.   MRI Brain - Multiple infarcts involving right thalamus/IC, left SCA territory, and right semiovale  CTA H&N -  Unremarkable   CT perfusion - normal CT brain perfusion.     Repeat TEE no endocarditis, no PFO  LDL - 152   HgbA1c - 5.1  VTE prophylaxis - SCDs  Diet  - Heart healthy with thin liquids.  No antithrombotic prior to admission, now on No antithrombotic.  No antiplatelet indicated for infectious vasculitis or septic emboli due to endocarditis  Ongoing aggressive antibiotic treatment  Therapy recommendations:  pending  Disposition:  Pending  Strep pneumo meningitis  Patient recently admitted on 01/15/2018 for strep pneumo bacteremia and meningitis, CSF consistent with meningitis, patient was put on vancomycin and Rocephin.  TEE 01/19/2018 showed no vegetation  Has been on Vanco and Rocephin at home and to admission  MRI Brain with contrast this time - Scattered leptomeningeal enhancement overlying the right cerebral convexity and about the perimesencephalic and supra cerebellar cisterns, most consistent with meningitis. Associated patchy post-contrast enhancement within the bilateral cerebral hemispheres and cerebellum as above, compatible with associated cerebritis.  ID on board, considered residual exudate from meningitis, does not indicate persistent infection  Blood culture repeat  NGTD  Continue vancomycin and Rocephin at this time  Repeat TEE no endocarditis  Agree with ID to continue Abx for total 6 weeks.   Total hearing loss - seems to be sequale from strep pneumo miningitis   Hearing loss presented on first admission on 01/15/2018  Hearing loss improved after antibiotic treatment  However hair loss worsened 01/24/2018  ENT saw patient on 01/27/2018 showed total bilateral hearing loss and recommend [facility] follow-up for cochlear implants  Hearing loss likely due to strep neuro meningitis  Hypertension  BP mildly high . Long-term BP goal normotensive  Hyperlipidemia  Lipid lowering medication PTA:  none  LDL 152, goal < 70  Current lipid lowering medication: Lipitor 20  Continue statin at discharge  Other Stroke Risk Factors    Other Active Problems  Turner syndrome  Depression on Prozac  Hospital day # 3  Neurology will sign off. Please call with questions. Pt will follow up with stroke clinic NP at [facility] in about 4 weeks. Thanks for the consult.   Rosalin Hawking, MD PhD Stroke Neurology 02/02/2018 3:45 PM   To contact Stroke Continuity provider, please refer to [email or web address] After hours, contact General Neurology

## 2018-02-03 ENCOUNTER — Other Ambulatory Visit: Payer: Self-pay

## 2018-02-03 ENCOUNTER — Encounter (HOSPITAL_COMMUNITY): Payer: Self-pay | Admitting: Internal Medicine

## 2018-02-03 ENCOUNTER — Inpatient Hospital Stay (HOSPITAL_COMMUNITY)
Admission: RE | Admit: 2018-02-03 | Discharge: 2018-02-12 | DRG: 056 | Disposition: A | Discharge status: Home Health | Payer: Medicaid Other | Source: Intra-hospital | Attending: Physical Medicine & Rehabilitation | Admitting: Physical Medicine & Rehabilitation

## 2018-02-03 ENCOUNTER — Inpatient Hospital Stay: Payer: Self-pay

## 2018-02-03 DIAGNOSIS — R32 Unspecified urinary incontinence: Secondary | ICD-10-CM | POA: Diagnosis not present

## 2018-02-03 DIAGNOSIS — Q231 Congenital insufficiency of aortic valve: Secondary | ICD-10-CM | POA: Diagnosis not present

## 2018-02-03 DIAGNOSIS — E039 Hypothyroidism, unspecified: Secondary | ICD-10-CM | POA: Diagnosis present

## 2018-02-03 DIAGNOSIS — B953 Streptococcus pneumoniae as the cause of diseases classified elsewhere: Secondary | ICD-10-CM | POA: Diagnosis present

## 2018-02-03 DIAGNOSIS — E871 Hypo-osmolality and hyponatremia: Secondary | ICD-10-CM | POA: Diagnosis not present

## 2018-02-03 DIAGNOSIS — R7881 Bacteremia: Secondary | ICD-10-CM | POA: Diagnosis present

## 2018-02-03 DIAGNOSIS — I639 Cerebral infarction, unspecified: Secondary | ICD-10-CM | POA: Diagnosis present

## 2018-02-03 DIAGNOSIS — Q969 Turner's syndrome, unspecified: Secondary | ICD-10-CM

## 2018-02-03 DIAGNOSIS — R5383 Other fatigue: Secondary | ICD-10-CM | POA: Diagnosis not present

## 2018-02-03 DIAGNOSIS — I6381 Other cerebral infarction due to occlusion or stenosis of small artery: Secondary | ICD-10-CM | POA: Diagnosis present

## 2018-02-03 DIAGNOSIS — G9341 Metabolic encephalopathy: Secondary | ICD-10-CM | POA: Diagnosis not present

## 2018-02-03 DIAGNOSIS — R0682 Tachypnea, not elsewhere classified: Secondary | ICD-10-CM

## 2018-02-03 DIAGNOSIS — F411 Generalized anxiety disorder: Secondary | ICD-10-CM | POA: Diagnosis present

## 2018-02-03 DIAGNOSIS — Z79899 Other long term (current) drug therapy: Secondary | ICD-10-CM | POA: Diagnosis not present

## 2018-02-03 DIAGNOSIS — E785 Hyperlipidemia, unspecified: Secondary | ICD-10-CM | POA: Diagnosis present

## 2018-02-03 DIAGNOSIS — I69354 Hemiplegia and hemiparesis following cerebral infarction affecting left non-dominant side: Secondary | ICD-10-CM | POA: Diagnosis present

## 2018-02-03 DIAGNOSIS — E876 Hypokalemia: Secondary | ICD-10-CM | POA: Diagnosis not present

## 2018-02-03 DIAGNOSIS — Z8249 Family history of ischemic heart disease and other diseases of the circulatory system: Secondary | ICD-10-CM

## 2018-02-03 DIAGNOSIS — I69398 Other sequelae of cerebral infarction: Secondary | ICD-10-CM | POA: Diagnosis not present

## 2018-02-03 DIAGNOSIS — I10 Essential (primary) hypertension: Secondary | ICD-10-CM | POA: Diagnosis present

## 2018-02-03 DIAGNOSIS — G002 Streptococcal meningitis: Secondary | ICD-10-CM | POA: Diagnosis present

## 2018-02-03 DIAGNOSIS — M419 Scoliosis, unspecified: Secondary | ICD-10-CM | POA: Diagnosis present

## 2018-02-03 DIAGNOSIS — Z791 Long term (current) use of non-steroidal anti-inflammatories (NSAID): Secondary | ICD-10-CM | POA: Diagnosis not present

## 2018-02-03 DIAGNOSIS — Z79890 Hormone replacement therapy (postmenopausal): Secondary | ICD-10-CM | POA: Diagnosis not present

## 2018-02-03 DIAGNOSIS — Z888 Allergy status to other drugs, medicaments and biological substances status: Secondary | ICD-10-CM

## 2018-02-03 DIAGNOSIS — H903 Sensorineural hearing loss, bilateral: Secondary | ICD-10-CM | POA: Diagnosis present

## 2018-02-03 DIAGNOSIS — Z8661 Personal history of infections of the central nervous system: Secondary | ICD-10-CM

## 2018-02-03 DIAGNOSIS — M542 Cervicalgia: Secondary | ICD-10-CM | POA: Diagnosis not present

## 2018-02-03 DIAGNOSIS — G001 Pneumococcal meningitis: Secondary | ICD-10-CM | POA: Diagnosis not present

## 2018-02-03 DIAGNOSIS — G039 Meningitis, unspecified: Secondary | ICD-10-CM

## 2018-02-03 LAB — CBC WITH DIFFERENTIAL/PLATELET
Abs Immature Granulocytes: 0.03 10*3/uL (ref 0.00–0.07)
Basophils Absolute: 0.1 10*3/uL (ref 0.0–0.1)
Basophils Relative: 1 %
EOS PCT: 1 %
Eosinophils Absolute: 0.1 10*3/uL (ref 0.0–0.5)
HCT: 36.2 % (ref 36.0–46.0)
Hemoglobin: 12.2 g/dL (ref 12.0–15.0)
Immature Granulocytes: 0 %
Lymphocytes Relative: 9 %
Lymphs Abs: 0.7 10*3/uL (ref 0.7–4.0)
MCH: 29.2 pg (ref 26.0–34.0)
MCHC: 33.7 g/dL (ref 30.0–36.0)
MCV: 86.6 fL (ref 80.0–100.0)
Monocytes Absolute: 0.9 10*3/uL (ref 0.1–1.0)
Monocytes Relative: 12 %
Neutro Abs: 5.8 10*3/uL (ref 1.7–7.7)
Neutrophils Relative %: 77 %
Platelets: 258 10*3/uL (ref 150–400)
RBC: 4.18 MIL/uL (ref 3.87–5.11)
RDW: 13.9 % (ref 11.5–15.5)
WBC: 7.5 10*3/uL (ref 4.0–10.5)
nRBC: 0 % (ref 0.0–0.2)

## 2018-02-03 LAB — COMPREHENSIVE METABOLIC PANEL
ALT: 187 U/L — ABNORMAL HIGH (ref 0–44)
AST: 39 U/L (ref 15–41)
Albumin: 2.6 g/dL — ABNORMAL LOW (ref 3.5–5.0)
Alkaline Phosphatase: 148 U/L — ABNORMAL HIGH (ref 38–126)
Anion gap: 10 (ref 5–15)
BILIRUBIN TOTAL: 0.4 mg/dL (ref 0.3–1.2)
BUN: 6 mg/dL (ref 6–20)
CO2: 24 mmol/L (ref 22–32)
Calcium: 8.3 mg/dL — ABNORMAL LOW (ref 8.9–10.3)
Chloride: 99 mmol/L (ref 98–111)
Creatinine, Ser: 0.4 mg/dL — ABNORMAL LOW (ref 0.44–1.00)
GFR calc Af Amer: >60 mL/min (ref >60)
GFR calc non Af Amer: >60 mL/min (ref >60)
Glucose, Bld: 98 mg/dL (ref 70–99)
Potassium: 3.6 mmol/L (ref 3.5–5.1)
Sodium: 133 mmol/L — ABNORMAL LOW (ref 135–145)
TOTAL PROTEIN: 5.8 g/dL — AB (ref 6.5–8.1)

## 2018-02-03 LAB — VANCOMYCIN, TROUGH: Vancomycin Tr: 8 ug/mL — ABNORMAL LOW (ref 15–20)

## 2018-02-03 LAB — PHOSPHORUS: PHOSPHORUS: 2.6 mg/dL (ref 2.5–4.6)

## 2018-02-03 LAB — MAGNESIUM: Magnesium: 1.7 mg/dL (ref 1.7–2.4)

## 2018-02-03 MED ORDER — ATORVASTATIN CALCIUM 20 MG PO TABS: 20.0000 mg | ORAL_TABLET | Freq: Every day | ORAL | Status: DC

## 2018-02-03 MED ORDER — CEFTRIAXONE IV (FOR PTA / DISCHARGE USE ONLY): 2.0000 g | Freq: Two times a day (BID) | INTRAVENOUS | 0 refills | Status: DC

## 2018-02-03 MED ORDER — VANCOMYCIN HCL IN DEXTROSE 750-5 MG/150ML-% IV SOLN
750.0000 mg | Freq: Four times a day (QID) | INTRAVENOUS | Status: DC
  Administered 2018-02-03 – 2018-02-04 (×3): 750 mg via INTRAVENOUS
  Filled 2018-02-03 (×4): qty 150

## 2018-02-03 MED ORDER — SODIUM CHLORIDE 0.9% FLUSH: 10.0000 mL | Freq: Two times a day (BID) | INTRAVENOUS | Status: DC

## 2018-02-03 MED ORDER — ATORVASTATIN CALCIUM 10 MG PO TABS
20.0000 mg | ORAL_TABLET | Freq: Every day | ORAL | Status: DC
  Administered 2018-02-04 – 2018-02-11 (×8): 20 mg via ORAL
  Filled 2018-02-03 (×11): qty 2

## 2018-02-03 MED ORDER — SENNOSIDES-DOCUSATE SODIUM 8.6-50 MG PO TABS: 1.0000 | ORAL_TABLET | Freq: Every evening | ORAL | Status: DC | PRN

## 2018-02-03 MED ORDER — FLUOXETINE HCL 20 MG PO CAPS
40.0000 mg | ORAL_CAPSULE | Freq: Every day | ORAL | Status: DC
  Administered 2018-02-04 – 2018-02-12 (×9): 40 mg via ORAL
  Filled 2018-02-03 (×9): qty 2

## 2018-02-03 MED ORDER — SORBITOL 70 % SOLN
30.0000 mL | Freq: Every day | Status: DC | PRN
  Administered 2018-02-04: 30 mL via ORAL
  Filled 2018-02-03: qty 30

## 2018-02-03 MED ORDER — SODIUM CHLORIDE 0.9 % IV SOLN
2.0000 g | Freq: Two times a day (BID) | INTRAVENOUS | Status: DC
  Administered 2018-02-03 – 2018-02-12 (×18): 2 g via INTRAVENOUS
  Filled 2018-02-03 (×22): qty 20

## 2018-02-03 MED ORDER — ACETAMINOPHEN 160 MG/5ML PO SOLN: 650.0000 mg | ORAL | Status: DC | PRN

## 2018-02-03 MED ORDER — VANCOMYCIN IV (FOR PTA / DISCHARGE USE ONLY): 1250.0000 mg | Freq: Three times a day (TID) | INTRAVENOUS | 0 refills | Status: DC

## 2018-02-03 MED ORDER — VANCOMYCIN HCL IN DEXTROSE 750-5 MG/150ML-% IV SOLN
750.0000 mg | Freq: Four times a day (QID) | INTRAVENOUS | Status: DC
  Filled 2018-02-03: qty 150

## 2018-02-03 MED ORDER — VANCOMYCIN IV (FOR PTA / DISCHARGE USE ONLY): 750.0000 mg | Freq: Four times a day (QID) | INTRAVENOUS | 0 refills | Status: DC

## 2018-02-03 MED ORDER — LEVOTHYROXINE SODIUM 75 MCG PO TABS
75.0000 ug | ORAL_TABLET | Freq: Every day | ORAL | Status: DC
  Administered 2018-02-04 – 2018-02-12 (×9): 75 ug via ORAL
  Filled 2018-02-03 (×9): qty 1

## 2018-02-03 MED ORDER — ACETAMINOPHEN 650 MG RE SUPP: 650.0000 mg | RECTAL | Status: DC | PRN

## 2018-02-03 MED ORDER — ACETAMINOPHEN 325 MG PO TABS
650.0000 mg | ORAL_TABLET | ORAL | Status: DC | PRN
  Administered 2018-02-04 – 2018-02-08 (×3): 650 mg via ORAL
  Filled 2018-02-03 (×3): qty 2

## 2018-02-03 MED ORDER — SODIUM CHLORIDE 0.9% FLUSH: 10.0000 mL | INTRAVENOUS | Status: DC | PRN

## 2018-02-03 MED ORDER — FLUTICASONE PROPIONATE 50 MCG/ACT NA SUSP
2.0000 | Freq: Every day | NASAL | Status: DC
  Administered 2018-02-04 – 2018-02-12 (×8): 2 via NASAL

## 2018-02-03 MED ORDER — ACETAMINOPHEN 325 MG PO TABS: 650.0000 mg | ORAL_TABLET | ORAL | Status: DC | PRN

## 2018-02-03 NOTE — Progress Notes (Signed)
Received pt. As a new admission,pt. And family were oriented to rehab.

## 2018-02-03 NOTE — Evaluation (Signed)
Speech Language Pathology Evaluation Patient Details Name: Emma Martin MRN: [id number] DOB: 05-29-1980 Today's Date: 02/03/2018 Time: 3382-5053 SLP Time Calculation (min) (ACUTE ONLY): 23 min  Problem List:  Patient Active Problem List   Diagnosis Date Noted  . Cerebrovascular accident (CVA) ([place])   . Benign essential HTN   . Tachypnea   . Sensorineural hearing loss (SNHL) of both ears 01/31/2018  . Dyslipidemia 01/31/2018  . Acute ischemic stroke ([place]) 01/30/2018  . Pneumococcal bacteremia   . Pneumococcal meningitis   . Turner syndrome   . Hypothyroidism 01/15/2018  . Generalized anxiety disorder 01/15/2018  . Bicuspid aortic valve 04/06/2013  . HTN (hypertension) 04/06/2013   Past Medical History:  Past Medical History:  Diagnosis Date  . Bicuspid aortic valve    , mild aortic insufficiency. (No SBE prophylaxis needed)  . Complication of anesthesia   . Dyslipidemia   . Generalized anxiety disorder    , with obsessive-compulsive traits.  . Hypertension   . Hypothyroidism   . Mixed gonadal dysgenesis    , (additional Y-bearing cell line) for which she reportedly underwent bilateral gonadectomy (patient unaware; needs confirmation) due to malignancy potential.  . PONV (postoperative nausea and vomiting)   . Primary amenorrhea    , treated starting at age 89 or 21 with estrogen and progesterone  . Scoliosis    , treated with Harrington rods (1997) for stabilization.  . Seasonal allergic rhinitis   . Short stature    , previously treated with growth hormone age 67-15 years.  Radford Pax syndrome    , previously followed by pediatric endocrinologist Emma Jaksch, MD at [facility] through her early 72s).   Past Surgical History:  Past Surgical History:  Procedure Laterality Date  . TEE WITHOUT CARDIOVERSION N/A 01/19/2018   Procedure: TRANSESOPHAGEAL ECHOCARDIOGRAM (TEE);  Surgeon: Jerline Pain, MD;  Location: [facility] ENDOSCOPY;  Service: Cardiovascular;   Laterality: N/A;   HPI:  Emma Martin is a 38 y.o. female with a history of hypertension, hypothyroidism, Turner syndrome, scoliosis status post Harrington rods, bicuspid aortic valve, streptococcal meningitis and bacteremia. 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   Assessment / Plan / Recommendation Clinical Impression  Pt with recent admission on 01/15/2018 for sepesis d/t invasive pneumococcal disease with  with bacteremia. As a result, pt sustained profound bilateral sensorineural hearing loss. Pt with audiological assessment on 01/27/18. She presented  with Type A and Type A tympanograms for the right and left ear respectively as well as no response to sound in either ear from 250- 8000 Hz, although an occasional vibrotactile response occurred. Prior to this hospitalization, pt was being followed by Dr Emma Martin (ENT at [facility]) for potential bilateral cochelar implants.   During this cognitive linguistic evaluation, pt continues to be verbal with apropriate intonation, rate and overall speech production. SLP communicated with pt by using talk to text on SLP's phone. Pt able to read information presented via typed text on device. Pt is also able to write legibly with slightly smaller font than baseline. Several portions of the [facility] were presented with instructions provided via text. Instructions were presented once then removed. Instructions were not left visible to pt. Pt able to demonstrate attention to tasks but her effeciency decreased when there was movement present in room. She doesn't look up at the movements in room, but her working ability changes.   Pt  is oriented x 4 and was able to complete clock drawing, alternating pattern, identify objects, list words with category and complete serial 7's. At baseline, prior to admission for meningitis, pt was independent with  all functions of daily living. She had moved in with her father becasue she was laid off from her job. Given pt's previous level of independence and great support within the home environment, continue to recommend follow up ST at the CIR level to target complex cognitive tasks of daily living, awareness, and further assessment of memory and attention. Additionally, pt was observed writing down answers to questions. Education provided that pt needed to continue using verbal communication to respond to all questions as this would continue to provide appropriate sensory feedback and her speech is more than functional. Father, family and pt voiced understanding.     SLP Assessment  SLP Recommendation/Assessment: Patient needs continued Speech Lanaguage Pathology Services SLP Visit Diagnosis: Cognitive communication deficit (R41.841)    Follow Up Recommendations  Inpatient Rehab    Frequency and Duration min 2x/week  2 weeks      SLP Evaluation Cognition  Overall Cognitive Status: Impaired/Different from baseline Arousal/Alertness: Awake/alert Orientation Level: Oriented X4 Attention: Sustained Sustained Attention: Impaired Sustained Attention Impairment: Functional complex Memory: Impaired Memory Impairment: Decreased short term memory;Decreased recall of new information Decreased Short Term Memory: Functional complex Awareness: Impaired Awareness Impairment: Emergent impairment;Anticipatory impairment Problem Solving: Impaired Problem Solving Impairment: Functional complex       Comprehension  Auditory Comprehension Overall Auditory Comprehension: (pt profoundly deaf d/t menegitis) Yes/No Questions: Within Functional Limits(when written and viewed once by pt) Commands: Within Functional Limits(when written and viewed once by pt) Visual Recognition/Discrimination Discrimination: Within Function Limits Reading Comprehension Reading Status: Within funtional limits    Expression  Expression Primary Mode of Expression: Verbal Verbal Expression Overall Verbal Expression: Appears within functional limits for tasks assessed Pragmatics: Impairment Impairments: Abnormal affect;Eye contact Interfering Components: Attention(recent hearing loss likely impactful as well) Written Expression Dominant Hand: Right Written Expression: Within Functional Limits(although smaller than baseline)   Oral / Motor  Oral Motor/Sensory Function Overall Oral Motor/Sensory Function: Within functional limits Motor Speech Overall Motor Speech: Appears within functional limits for tasks assessed Respiration: Within functional limits Phonation: Normal Resonance: Within functional limits Articulation: Within functional limitis Intelligibility: Intelligible Motor Planning: Witnin functional limits Motor Speech Errors: Not applicable   GO                    Humza Tallerico 02/03/2018, 12:46 PM

## 2018-02-03 NOTE — Progress Notes (Signed)
PROGRESS NOTE    Emma Martin  OFB:[id number] DOB: 1980/09/20 DOA: 01/30/2018 PCP: Maurice Small, MD  Brief Narrative:  Patient is a 38 year old Caucasian female with a past medical history significant for hypertension, hypothyroidism, Turner syndrome, scoliosis status post Harrington rods, bicuspid aortic valve, history of streptococcal meningitis and recent bacteremia.  She presented with left-sided weakness was found to have multiple acute strokes concern for embolic phenomenon related likely to infectious source.  Even though with no vegetation on TEE is likely that patient may have had emboli due to endocarditis or infectious vasculitis.  Infectious disease was consulted and recommending patient be treated with anti-biotics for 6 weeks with IV vancomycin and IV Rocephin.  PT involved in reevaluating for CIR.  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 Concern for septic emboli in setting of recent strep pneumo bacteremia and meningitis possible infectious vasculitis versus septic emboli from endocarditis.   -Patient has a recent TEE without evidence of vegetations.   -Blood cultures prior to discharge were no growth.  -Repeat Transesophageal Echocardiogram (1/14) without evidence of vegetation.  -Patient with some intermittent confusion but this is improved -Neurology recommendations: No antiplatelet therapy given that this may be infectious vasculitis or septic emboli due to endocarditis -ID recommendations: IV  Vancomycin/Ceftriaxone for 6 weeks total and will be placing a PICC line again -Continue vancomycin/ceftriaxone for now -Patient's LDL is 152 and hemoglobin A1c was 5.1 -Patient is to follow-up with stroke clinic and 4 weeks at [facility] [place]  History of streptococcal Pneumonia Bacteremia/Meningitis -Was recently admitted 01/15/18 for strep pneumo bacteremia meningitis with CSF consistent with meningitis Treated with 2 weeks of vancomycin and ceftriaxone. -Work-up as mentioned above.  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  -ENT saw the patient on 01/27/2018 and she had total bilateral hearing loss  -Patient has outpatient plans for [facility] ENT follow-up for cochlear implants  Essential hypertension -Patient is on lisinopril an outpatient.   -Currently not resumed in setting of permissive hypertension -Neurology long-term blood pressure management goal is normotensive.  Hypothyroidism -Continue Synthroid 75 mcg's p.o. daily  Mood Disorder/Depression  -Continue Fluoxetine 40 mg po Daily   Bicuspid Valve -Murmur present -Outpatient follow up  Hyperlipidemia -Lipid panel showed cholesterol of 214, HDL 45, LDL of 152, triglycerides of 84, and VLDL 17 -Continue with Atorvastatin 20 mils p.o. daily  Hyponatremia -Mild at 133 -Continue monitor and if necessary will place on normal saline -Repeat CMP in AM   Abnormal ALT -His AST this morning was 187 -Likely reactive but will continue to monitor and if worsening will obtain right upper quadrant ultrasound as well as  acute hepatitis panel -Continue monitor and trend liver function  panel and repeat CMP in a.m.  DVT prophylaxis: SCDs Code Status: FULL CODE Family Communication: Discussed with Father and Family at bedside  Disposition Plan: Possible CIR vs. Home with Home Health  With PICC  Consultants:   Neurology  Infectious Diseases   Procedures: TEE 02/02/2018 ------------------------------------------------------------------- Study Conclusions  - History: Bicuspid aortic valve, Turner&'s syndrome, pneumococcal   meningitis, multiple acute strokes. - Left ventricle: The cavity size was normal. Wall thickness was   normal. Systolic function was normal. The estimated ejection   fraction was in the range of 55% to 60%. - Aortic valve: Bicuspid with fusion of right and left coronary   cusps, raphe present. There was mild to moderate regurgitation   (vena contracta 0.4cm). No vegetations or thrombi. - Aorta: Mid-ascending aortic diameter: 35.53 mm (ED). - Mitral valve: No evidence of vegetation. There was mild   regurgitation. - Left atrium: No evidence of thrombus in the atrial cavity or   appendage. - Right ventricle: The cavity size was normal. Wall thickness was   increased. - Right atrium: The atrium was normal in size. - Atrial septum: No defect or patent foramen ovale was identified.   Echo contrast study showed no right-to-left atrial level shunt. - Tricuspid valve: No evidence of vegetation. There was trivial   regurgitation. - Pulmonic valve: No evidence of vegetation. - Pericardium, extracardiac: A trivial pericardial effusion was   identified. - A mild narrowing is seen in the descending thoraocic aorta just   distal to the aortic arch, suggestive of possible coarctation of   the aorta. There no hemodynamically significant stenosis in this   region. No adherent thrombi or vegetations.  Impressions:  - No cardiac source of emboli was identified. No valviular    vegetations.   Antimicrobials:  Anti-infectives (From admission, onward)   Start     Dose/Rate Route Frequency Ordered Stop   02/01/18 2200  vancomycin (VANCOCIN) 1,250 mg in sodium chloride 0.9 % 250 mL IVPB     1,250 mg 166.7 mL/hr over 90 Minutes Intravenous Every 8 hours 02/01/18 2045     01/31/18 1000  vancomycin (VANCOCIN) IVPB 1000 mg/200 mL premix  Status:  Discontinued     1,000 mg 200 mL/hr over 60 Minutes Intravenous Every 8 hours 01/30/18 1845 02/01/18 2045   01/31/18 0800  cefTRIAXone (ROCEPHIN) 2 g in sodium chloride 0.9 % 100 mL IVPB     2 g 200 mL/hr over 30 Minutes Intravenous Every 12 hours 01/30/18 1736     01/30/18 1830  vancomycin (VANCOCIN) IVPB 1000 mg/200 mL premix     1,000 mg 200 mL/hr over 60 Minutes Intravenous STAT 01/30/18 1829 01/31/18 0210   01/30/18 1745  cefTRIAXone (ROCEPHIN) 2 g in sodium chloride 0.9 % 100 mL IVPB     2 g 200 mL/hr over 30 Minutes Intravenous STAT 01/30/18 1734 01/30/18 1933   01/30/18 1730  cefTRIAXone (ROCEPHIN) 2 g in sodium chloride 0.9 % 100 mL IVPB  Status:  Discontinued     2 g 200 mL/hr over 30 Minutes Intravenous Every 24 hours 01/30/18 1720 01/30/18 1734     Subjective: Seen and examined at bedside and she is severely hard of hearing and had to communicate with her via text and she responded verbally.  No chest pain, nausea or vomiting states that she is getting anxious.  No other concerns or complaints at this time.  Objective: Vitals:   02/02/18 2027 02/02/18 2301 02/03/18 0442 02/03/18 0719  BP: (!) 148/83  139/77 (!) 148/91 140/89  Pulse: 99 93 90 90  Resp: 15 16 13 15   Temp: 98.7 F (37.1 C) 98.2 F (36.8 C) (!) 97.5 F (36.4 C) 99.3 F (37.4 C)  TempSrc: Oral Axillary Axillary Oral  SpO2: 100% 100% 100% 100%  Weight:      Height:        Intake/Output Summary (Last 24 hours) at 02/03/2018 1132 Last data filed at 02/03/2018 9373 Gross per 24 hour  Intake 1507.01 ml  Output 1570 ml  Net -62.99 ml    Filed Weights   01/30/18 1018 01/31/18 2330  Weight: 49.7 kg 50.8 kg   Examination: Physical Exam:  Constitutional: Caucasian NAD and appears calm and comfortable Eyes: Lids and conjunctivae normal, sclerae anicteric  ENMT: External Ears, Nose appear normal. Very hard of hearing  Neck: Appears normal, supple, no cervical masses, normal ROM, no appreciable thyromegaly; no JVD Respiratory: Diminished to auscultation bilaterally, no wheezing, rales, rhonchi or crackles. Normal respiratory effort and patient is not tachypenic. No accessory muscle use.  Cardiovascular: RRR, Has a 2/6 Murmur. No extremity edema. 2+ pedal pulses. No carotid bruits.  Abdomen: Soft, non-tender, non-distended. No masses palpated. No appreciable hepatosplenomegaly. Bowel sounds positive x4.  GU: Deferred. Musculoskeletal: No clubbing / cyanosis of digits/nails. No joint deformity upper and lower extremities Skin: No rashes, lesions, ulcers on a limited skin evaluation. No induration; Warm and dry.  Neurologic: CN 2-12 grossly intact with no focal deficits.  Romberg sign and cerebellar reflexes not assessed.  Psychiatric: Normal judgment and insight. Awake and alert.. Slightly anxious and appropriate affect.   Data Reviewed: I have personally reviewed following labs and imaging studies  CBC: Recent Labs  Lab 01/30/18 0927 01/30/18 0934 02/03/18 0843  WBC 10.4  --  7.5  NEUTROABS 8.5*  --  5.8  HGB 12.4 13.3 12.2  HCT 38.8 39.0 36.2  MCV 88.0  --  86.6  PLT 327  --  428   Basic Metabolic Panel: Recent Labs  Lab 01/30/18 0927 01/30/18 0934 02/02/18 0441 02/03/18 0843  NA 136 136 133* 133*  K 3.5 3.6 3.3* 3.6  CL 100 98 96* 99  CO2 26  --  27 24  GLUCOSE 97 89 101* 98  BUN 7 9 9 6   CREATININE 0.43* 0.20* 0.48 0.40*  CALCIUM 8.8*  --  8.5* 8.3*  MG  --   --   --  1.7  PHOS  --   --   --  2.6   GFR: Estimated Creatinine Clearance: 73.4 mL/min (A) (by C-G formula based on SCr of 0.4 mg/dL  (L)). Liver Function Tests: Recent Labs  Lab 01/30/18 0927 02/03/18 0843  AST 184* 39  ALT 386* 187*  ALKPHOS 216* 148*  BILITOT 0.9 0.4  PROT 6.8 5.8*  ALBUMIN 2.9* 2.6*   No results for input(s): LIPASE, AMYLASE in the last 168 hours. No results for input(s): AMMONIA in the last 168 hours. Coagulation Profile: Recent Labs  Lab 01/30/18 0927  INR 1.11   Cardiac Enzymes: No results for input(s): CKTOTAL, CKMB, CKMBINDEX, TROPONINI in the last 168 hours. BNP (last 3 results) No results for input(s): PROBNP in the last 8760 hours. HbA1C: No results for input(s): HGBA1C in the last 72 hours. CBG: Recent Labs  Lab 01/30/18 0928  GLUCAP 76   Lipid Profile: No results for input(s): CHOL, HDL, LDLCALC, TRIG, CHOLHDL, LDLDIRECT in the last 72 hours. Thyroid Function Tests: No results for input(s): TSH, T4TOTAL, FREET4,  T3FREE, THYROIDAB in the last 72 hours. Anemia Panel: No results for input(s): VITAMINB12, FOLATE, FERRITIN, TIBC, IRON, RETICCTPCT in the last 72 hours. Sepsis Labs: No results for input(s): PROCALCITON, LATICACIDVEN in the last 168 hours.  Recent Results (from the past 240 hour(s))  Culture, blood (routine x 2)     Status: None (Preliminary result)   Collection Time: 01/30/18  5:25 PM  Result Value Ref Range Status   Specimen Description BLOOD LEFT FOREARM  Final   Special Requests   Final    BOTTLES DRAWN AEROBIC AND ANAEROBIC Blood Culture adequate volume   Culture   Final    NO GROWTH 4 DAYS Performed at [facility], [address] [place], [place] [address]    Report Status PENDING  Incomplete  Culture, blood (routine x 2)     Status: None (Preliminary result)   Collection Time: 01/30/18  5:28 PM  Result Value Ref Range Status   Specimen Description BLOOD LEFT ANTECUBITAL  Final   Special Requests   Final    BOTTLES DRAWN AEROBIC AND ANAEROBIC Blood Culture adequate volume   Culture   Final    NO GROWTH 4 DAYS Performed at [facility], [place] [address] [place], [place] [address]    Report Status PENDING  Incomplete  MRSA PCR Screening     Status: None   Collection Time: 01/30/18  6:02 PM  Result Value Ref Range Status   MRSA by PCR NEGATIVE NEGATIVE Final    Comment:        The GeneXpert MRSA Assay (FDA approved for NASAL specimens only), is one component of a comprehensive MRSA colonization surveillance program. It is not intended to diagnose MRSA infection nor to guide or monitor treatment for MRSA infections. Performed at [facility], [place] [address] [place], [place] [address]     Radiology Studies: [place] Ekg Site Rite  Result Date: 02/03/2018 If [facility] image not attached, placement could not be confirmed due to current cardiac rhythm.   Scheduled Meds: .  stroke: mapping our early stages of recovery book   Does not apply Once  . atorvastatin  20 mg Oral q1800  . FLUoxetine  40 mg Oral Daily  . fluticasone  2 spray Each Nare Daily  . levothyroxine  75 mcg Oral QAC breakfast   Continuous Infusions: . cefTRIAXone (ROCEPHIN)  IV 2 g (02/03/18 0901)  . vancomycin 1,250 mg (02/03/18 0553)    LOS: 4 days   Kerney Elbe, DO [facility] PAGER is on [place]  If 7PM-7AM, please contact night-coverage [email or web address] Password [facility] 02/03/2018, 11:32 AM

## 2018-02-03 NOTE — Progress Notes (Addendum)
[place] for Infectious Disease  Date of Admission:  01/30/2018   Total days of antibiotics 18        Day 18 Vancomycin        Day 18 Ceftriaxone         ASSESSMENT: Emma Martin is a 38 year old female with a history of Turner's syndrome, Bicuspid aortic valve, Hypothyroidism, Hypertension, and recent admission on 12/27 for meningitis with bacteremia, which was complicated by bilateral hearing loss (for which she has ENT follow up) who presented on 1/11 with left sided weakness. She was found to have multiple infarcts concerning for embolic source. Will treat for presumed endocarditis despite negative TEE given the lack of alternative explanation for her embolic CVA. Prior PICC was removed early in admission, will order new PICC to be placed. Patient is awaiting final approval for rehab, will be have antibiotics administered there if approved.  PLAN:  1. CVA, Bacteremia, Presumed Endocarditis  - Continue Vancomycin and Ceftriaxone through 02/26/18 - PICC Line Placement  Diagnosis: Bacteremia / Endocarditis  Culture Result: Strep Pneumoniae  Allergies  Allergen Reactions  . Benzoin Rash   OPAT Orders Discharge antibiotics: Per pharmacy protocol Vancomycin and Ceftriaxone Aim for Vancomycin trough 15-20 (unless otherwise indicated)  Duration: 6 weeks total  End Date: 02/26/2018  [facility] Care Per Protocol:  Labs weekly while on IV antibiotics: _x_ CBC with differential _x_ BMP, twice a week __ CMP __ CRP __ ESR _x_ Vancomycin trough __ CK  __ Please pull PIC at completion of IV antibiotics _x_ Please leave PIC in place until doctor has seen patient or been notified  Fax weekly labs to [phone]  Clinic Follow Up Appt: 01/25/2018 @ 10:00 with Janene Madeira, [place] for Infectious Disease   Principal Problem:   Acute ischemic stroke [facility]) Active Problems:   Bicuspid aortic valve   Pneumococcal bacteremia   Pneumococcal meningitis  Sensorineural hearing loss (SNHL) of both ears   HTN (hypertension)   Hypothyroidism   Generalized anxiety disorder   Turner syndrome   Dyslipidemia   Cerebrovascular accident (CVA) ([place])   Benign essential HTN   Tachypnea   Scheduled Meds: .  stroke: mapping our early stages of recovery book   Does not apply Once  . atorvastatin  20 mg Oral q1800  . FLUoxetine  40 mg Oral Daily  . fluticasone  2 spray Each Nare Daily  . levothyroxine  75 mcg Oral QAC breakfast   Continuous Infusions: . cefTRIAXone (ROCEPHIN)  IV 2 g (02/02/18 2042)  . vancomycin 1,250 mg (02/03/18 0553)   PRN Meds:.acetaminophen **OR** acetaminophen (TYLENOL) oral liquid 160 mg/5 mL **OR** acetaminophen, senna-docusate   SUBJECTIVE: Emma Martin is awake and alert she states she feels okay and does not have any questions when asked by written questions.  Review of Systems: Review of Systems  Constitutional: Negative for fever.    Allergies  Allergen Reactions  . Benzoin Rash    OBJECTIVE: Vitals:   02/02/18 2027 02/02/18 2301 02/03/18 0442 02/03/18 0719  BP: (!) 148/83 139/77 (!) 148/91 140/89  Pulse: 99 93 90 90  Resp: 15 16 13 15   Temp: 98.7 F (37.1 C) 98.2 F (36.8 C) (!) 97.5 F (36.4 C) 99.3 F (37.4 C)  TempSrc: Oral Axillary Axillary Oral  SpO2: 100% 100% 100% 100%  Weight:      Height:       Body mass index is 21.16 kg/m.  Physical  Exam Constitutional:      General: She is not in acute distress. Cardiovascular:     Rate and Rhythm: Normal rate and regular rhythm.     Heart sounds: Normal heart sounds.  Pulmonary:     Effort: Pulmonary effort is normal. No respiratory distress.     Breath sounds: Normal breath sounds.  Abdominal:     General: Abdomen is flat. Bowel sounds are normal. There is no distension.     Palpations: Abdomen is soft.     Tenderness: There is no abdominal tenderness.  Musculoskeletal:        General: No swelling, tenderness or deformity.  Skin:     General: Skin is warm and dry.  Neurological:     General: No focal deficit present.     Mental Status: She is alert. Mental status is at baseline.    Lab Results Lab Results  Component Value Date   WBC 10.4 01/30/2018   HGB 13.3 01/30/2018   HCT 39.0 01/30/2018   MCV 88.0 01/30/2018   PLT 327 01/30/2018    Lab Results  Component Value Date   CREATININE 0.48 02/02/2018   BUN 9 02/02/2018   NA 133 (L) 02/02/2018   K 3.3 (L) 02/02/2018   CL 96 (L) 02/02/2018   CO2 27 02/02/2018    Lab Results  Component Value Date   ALT 386 (H) 01/30/2018   AST 184 (H) 01/30/2018   ALKPHOS 216 (H) 01/30/2018   BILITOT 0.9 01/30/2018     Microbiology: Recent Results (from the past 240 hour(s))  Culture, blood (routine x 2)     Status: None (Preliminary result)   Collection Time: 01/30/18  5:25 PM  Result Value Ref Range Status   Specimen Description BLOOD LEFT FOREARM  Final   Special Requests   Final    BOTTLES DRAWN AEROBIC AND ANAEROBIC Blood Culture adequate volume   Culture   Final    NO GROWTH 3 DAYS Performed at [facility], [place] [address], [place], [place] [address]    Report Status PENDING  Incomplete  Culture, blood (routine x 2)     Status: None (Preliminary result)   Collection Time: 01/30/18  5:28 PM  Result Value Ref Range Status   Specimen Description BLOOD LEFT ANTECUBITAL  Final   Special Requests   Final    BOTTLES DRAWN AEROBIC AND ANAEROBIC Blood Culture adequate volume   Culture   Final    NO GROWTH 3 DAYS Performed at [facility], [place] [address], [place], [place] [address]    Report Status PENDING  Incomplete  MRSA PCR Screening     Status: None   Collection Time: 01/30/18  6:02 PM  Result Value Ref Range Status   MRSA by PCR NEGATIVE NEGATIVE Final    Comment:        The GeneXpert MRSA Assay (FDA approved for NASAL specimens only), is one component of a comprehensive MRSA colonization surveillance program. It is not intended to  diagnose MRSA infection nor to guide or monitor treatment for MRSA infections. Performed at [facility], [place] [address], [place], [place] [address]     Neva Seat, MD IM PGY-2 [phone] Attending: [phone] pager   [phone] cell 02/03/2018, 8:34 AM

## 2018-02-03 NOTE — Discharge Summary (Signed)
Physician Discharge Summary  Emma Martin:[id number] DOB: 03/05/1980 DOA: 01/30/2018  PCP: Maurice Small, MD  Admit date: 01/30/2018 Discharge date: 02/03/2018  Admitted From: Home Disposition: CIR  Recommendations for Outpatient Follow-up:  1. Follow up with PCP in 1-2 weeks 2. Follow up with Neurology within 4 weeks 3. Follow up with ID Clinic and continue IV Vancomycin and Ceftriaxone for total of 6 weeks  4. Please obtain CMP/CBC, Mag, Phos in one week 5. Please follow up on the following pending results:  Home Health: No Equipment/Devices: None recommended by PT  Discharge Condition: Stable  CODE STATUS: FULL CODE Diet recommendation: Heart Healthy Diet  Brief/Interim Summary: Patient is a 38 year old Caucasian female with a past medical history significant for hypertension, hypothyroidism, Turner syndrome, scoliosis status post Harrington rods, bicuspid aortic valve, history of streptococcal meningitis and recent bacteremia.  She presented with left-sided weakness was found to have multiple acute strokes concern for embolic phenomenon related likely to infectious source.  Even though with no vegetation on TEE is likely that patient may have had emboli due to endocarditis or infectious vasculitis.  Infectious disease was consulted and recommending patient be treated with anti-biotics for 6 weeks with IV vancomycin and IV Rocephin.  PT involved in reevaluating for CIR and she was a candidate. She will need to follow up with Neurology within 1 week as she is stable for D/C.  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 Concern for septic emboli in setting of recent strep pneumo bacteremia and meningitis possible infectious vasculitis versus septic emboli from endocarditis.  -Patient has a recent TEE without evidence of vegetations.  -Blood cultures prior to discharge were no growth.  -Repeat Transesophageal Echocardiogram (1/14) without evidence of vegetation.  -Patient with some intermittent confusion but this is improved -Neurology recommendations:No antiplatelet therapy given that this may be infectious vasculitis or septic emboli due to endocarditis -IDrecommendations: IV Vancomycin/Ceftriaxone for 6 weeks total and will be placing a PICC line again -Continue vancomycin/ceftriaxone for now -Patient's LDL is 152 and hemoglobin A1c was 5.1 -Patient is to follow-up with stroke clinic and 4 weeks at [place]  History of streptococcal Pneumonia Bacteremia/Meningitis -Was recently admitted 01/15/18 for strep pneumo bacteremia meningitis with CSF consistent with meningitis Treated with 2 weeks of vancomycin and ceftriaxone. -Work-up as mentioned above. 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  -ENT saw the patient on 01/27/2018 and she had total bilateral hearing loss -Patient has outpatient plans for [facility] ENT follow-up for cochlear implants  Essential hypertension -Patient is on lisinopril an outpatient.  -Currently not resumed in setting of permissive hypertension -Neurology  long-term blood pressure management goal is normotensive.  Hypothyroidism -Continue Synthroid 75  mcg's p.o. daily  Mood Disorder/Depression  -Continue Fluoxetine 40 mg po Daily   Bicuspid Valve -Murmur present -Outpatient follow up  Hyperlipidemia -Lipid panel showed cholesterol of 214, HDL 45, LDL of 152, triglycerides of 84, and VLDL 17 -Continue with Atorvastatin 20 mils p.o. daily  Hyponatremia -Mild at 133 -Continue monitor and if necessary will place on normal saline -Repeat CMP in AM   Abnormal ALT -His AST this morning was 187 -Likely reactive but will continue to monitor and if worsening will obtain right upper quadrant ultrasound as well as acute hepatitis panel -Continue monitor and trend liver function panel and repeat CMP in a.m.  Discharge Instructions Discharge Instructions    Call MD for:  difficulty breathing, headache or visual disturbances   Complete by:  As directed    Call MD for:  extreme fatigue   Complete by:  As directed    Call MD for:  hives   Complete by:  As directed    Call MD for:  persistant dizziness or light-headedness   Complete by:  As directed    Call MD for:  persistant nausea and vomiting   Complete by:  As directed    Call MD for:  redness, tenderness, or signs of infection (pain, swelling, redness, odor or green/yellow discharge around incision site)   Complete by:  As directed    Call MD for:  severe uncontrolled pain   Complete by:  As directed    Call MD for:  temperature >100.4   Complete by:  As directed    Diet - low sodium heart healthy   Complete by:  As directed    Discharge instructions   Complete by:  As directed    You were cared for by a hospitalist during your hospital stay. If you have any questions about your discharge medications or the care you received while you were in the hospital after you are discharged, you can call the unit and ask to speak with the hospitalist on call if the hospitalist that took  care of you is not available. Once you are discharged, your primary care physician will handle any further medical issues. Please note that NO REFILLS for any discharge medications will be authorized once you are discharged, as it is imperative that you return to your primary care physician (or establish a relationship with a primary care physician if you do not have one) for your aftercare needs so that they can reassess your need for medications and monitor your lab values.  Follow up with PCP, Neurology, and Infectious Diseases. Take all medications as prescribed. If symptoms change or worsen please return to the ED for evaluation   Home infusion instructions [facility] May follow [place] Dosing Protocol; May administer Cathflo as needed to maintain patency of vascular access device.; Flushing of vascular access device: per [facility] Protocol: 0.9% NaCl pre/post medica...   Complete by:  As directed    Instructions:  May follow [place] Dosing Protocol   Instructions:  May administer Cathflo as needed to maintain patency of vascular access device.   Instructions:  Flushing of vascular access device: per [facility] Protocol: 0.9% NaCl pre/post medication administration and prn patency; Heparin 100 u/ml, 75m for implanted ports and Heparin 10u/ml, 563mfor all other central venous catheters.   Instructions:  May follow [facility] Anaphylaxis Protocol for First Dose Administration in the home: 0.9% NaCl at 25-50 ml/hr to maintain IV access for protocol meds. Epinephrine 0.3 ml IV/IM  PRN and Benadryl 25-50 IV/IM PRN s/s of anaphylaxis.   Instructions:  [facility]fusion Coordinator (RN) to assist per patient IV care needs in the home PRN.   Increase activity slowly   Complete by:  As directed      Allergies as of 02/03/2018      Reactions   Benzoin Rash      Medication List    STOP taking these medications   norethindrone-ethinyl estradiol-iron 1.5-30 MG-MCG tablet Commonly known as:   MICROGESTIN FE,GILDESS FE,LOESTRIN FE     TAKE these medications   acetaminophen 325 MG tablet Commonly known as:  TYLENOL Take 2 tablets (650 mg total) by mouth every 4 (four) hours as needed for mild pain (or temp > 37.5 C (99.5 F)).   atorvastatin 20 MG tablet Commonly known as:  LIPITOR Take 1 tablet (20 mg total) by mouth daily at 6 PM.   cefTRIAXone  IVPB Commonly known as:  ROCEPHIN Inject 2 g into the vein every 12 (twelve) hours for 23 days. Indication: presumed S. pneumo endocarditis / CNS septic emboli Last Day of Therapy:  02/26/2018 Labs - Once weekly:  CBC/D and BMP, Labs - Every other week:  ESR and CRP What changed:  additional instructions   CENTRUM PO Take 1 tablet by mouth daily.   FLUoxetine 40 MG capsule Commonly known as:  PROZAC Take 40 mg by mouth daily.   fluticasone 50 MCG/ACT nasal spray Commonly known as:  FLONASE Place 2 sprays into both nostrils daily.   ibuprofen 200 MG tablet Commonly known as:  ADVIL,MOTRIN Take 200-400 mg by mouth every 8 (eight) hours as needed for headache.   levothyroxine 75 MCG tablet Commonly known as:  SYNTHROID, LEVOTHROID Take 75 mcg by mouth daily before breakfast.   lisinopril 10 MG tablet Commonly known as:  PRINIVIL,ZESTRIL Take 1 tablet (10 mg total) by mouth daily.   senna-docusate 8.6-50 MG tablet Commonly known as:  Senokot-S Take 1 tablet by mouth at bedtime as needed for mild constipation.   vancomycin  IVPB Inject 1,250 mg into the vein every 8 (eight) hours for 23 days. 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 Labs - Thursday:  BMP and vancomycin trough Labs - Every other week:  ESR and CRP What changed:    how much to take  additional instructions            Home Infusion Instuctions  (From admission, onward)         Start     Ordered   02/03/18 0000  Home infusion  instructions [facility] May follow [place] Dosing Protocol; May administer Cathflo as needed to maintain patency of vascular access device.; Flushing of vascular access device: per [facility] Protocol: 0.9% NaCl pre/post medica...    Question Answer Comment  Instructions May follow [place] Dosing Protocol   Instructions May administer Cathflo as needed to maintain patency of vascular access device.   Instructions Flushing of vascular access device: per [facility] Protocol: 0.9% NaCl pre/post medication administration and prn patency; Heparin 100 u/ml, 71m for implanted ports and Heparin 10u/ml, 580mfor all other central venous catheters.   Instructions May follow [facility] Anaphylaxis Protocol for First Dose Administration in the home: 0.9% NaCl at 25-50 ml/hr to maintain IV access for protocol meds. Epinephrine 0.3 ml IV/IM PRN and Benadryl 25-50 IV/IM PRN s/s of anaphylaxis.  Instructions [facility] Infusion Coordinator (RN) to assist per patient IV care needs in the home PRN.      02/03/18 1404         Follow-up Information    [facility]. Schedule an appointment as soon as possible for a visit in 4 week(s).   Specialty:  Neurology Contact information: [place] [phone]         Allergies  Allergen Reactions  . Benzoin Rash   Consultations:  Neurology  Infectious Diseases  Procedures/Studies: Ct Angio Head W Or Wo Contrast  Result Date: 01/30/2018 CLINICAL DATA:  Acute onset of left-sided weakness. EXAM: CT ANGIOGRAPHY HEAD AND NECK CT PERFUSION BRAIN TECHNIQUE: Multidetector CT imaging of the head and neck was performed using the standard protocol during bolus administration of intravenous contrast. Multiplanar CT image reconstructions and MIPs were obtained to evaluate the vascular anatomy. Carotid stenosis measurements (when applicable) are obtained utilizing NASCET criteria, using the distal  internal carotid diameter as the denominator. Multiphase CT imaging of the brain was performed following IV bolus contrast injection. Subsequent parametric perfusion maps were calculated using RAPID software. CONTRAST:  114m ISOVUE-370 IOPAMIDOL (ISOVUE-370) INJECTION 76% COMPARISON:  Head CT without contrast 01/30/2018 and 01/26/2018. FINDINGS: CTA NECK FINDINGS Aortic arch: A 3 vessel arch configuration is present. No significant atherosclerotic calcifications or stenosis is present. Right carotid system: The right common carotid artery is within normal limits. The bifurcation is unremarkable. Cervical right ICA is normal Left carotid system: The left common carotid artery is within normal limits. Left bifurcation is unremarkable. There is some tortuosity of the distal cervical left ICA without significant stenosis. Vertebral arteries: The right vertebral artery is dominant. Left vertebral artery is hypoplastic. Both vertebral arteries originate from the subclavian arteries without significant stenosis. There is no significant stenosis of either vertebral artery in the neck. Skeleton: Leftward curvature is present in the upper thoracic spine. There is mild rightward compensating curvature of the cervical spine. AP alignment is anatomic. There straightening of the normal cervical lordosis. Spinal fusion begins at T1. Other neck: The soft tissues of the neck are otherwise unremarkable. No focal mucosal lesions are present. Subcentimeter thyroid nodules are noted. No significant adenopathy is present. Salivary glands are within normal limits. Upper chest: Lung apices are clear. Review of the MIP images confirms the above findings CTA HEAD FINDINGS Anterior circulation: The internal carotid arteries are within normal limits from the skull base through the ICA termini. There is mild irregularity of the M1 segments bilaterally without a significant stenosis or occlusion. MCA bifurcations are intact bilaterally. Mild  diffuse small vessel disease is advanced for age. ACA branch vessels are intact. Posterior circulation: The right vertebral artery is dominant. PICA origins are visualized and normal. Vertebrobasilar junction is. Basilar artery is small, essentially terminating at the superior cerebellar arteries. There is a small right P1 segment. Posterior communicating arteries are present bilaterally. Venous sinuses: Dural sinuses are patent. Straight sinus deep cerebral pains are intact. Cortical veins are unremarkable. Anatomic variants: Fetal type left posterior cerebral artery and dominant right posterior communicating artery. Review of the MIP images confirms the above findings CT Brain Perfusion Findings: CBF (<30%) Volume: 065mPerfusion (Tmax>6.0s) volume: 40m4mismatch Volume: 40mL35mfarction Location:N/a IMPRESSION: 1. No focal ischemia or tissue at risk.  Normal CT brain perfusion. 2. No emergent large vessel occlusion. 3. Medium and small vessel irregularity is noted within the anterior circulation. Vessels are small. Question underlying  vasculitis. 4. Tortuosity of the distal cervical left ICA. There is no significant stenosis. This is commonly seen in the setting of hypertension. 5. Scoliosis. Electronically Signed   By: San Morelle M.D.   On: 01/30/2018 10:22   Ct Head Wo Contrast  Result Date: 01/26/2018 CLINICAL DATA:  Sudden onset hearing loss. Currently on IV advised X for bacterial meningitis. EXAM: CT HEAD WITHOUT CONTRAST TECHNIQUE: Contiguous axial images were obtained from the base of the skull through the vertex without intravenous contrast. COMPARISON:  CT head dated January 15, 2018. FINDINGS: Brain: No evidence of acute infarction, hemorrhage, hydrocephalus, extra-axial collection or mass lesion/mass effect. Vascular: No hyperdense vessel or unexpected calcification. Skull: Normal. Negative for fracture or focal lesion. Sinuses/Orbits: No acute findings. Unchanged paranasal sinus disease  with complete opacification of the left sphenoid sinus and a right posterior ethmoid air cell. Other: None. IMPRESSION: 1.  No acute intracranial abnormality. Electronically Signed   By: Titus Dubin M.D.   On: 01/26/2018 21:59   Ct Head Wo Contrast  Result Date: 01/15/2018 CLINICAL DATA:  Initial evaluation for acute headache, falls. EXAM: CT HEAD WITHOUT CONTRAST CT CERVICAL SPINE WITHOUT CONTRAST TECHNIQUE: Multidetector CT imaging of the head and cervical spine was performed following the standard protocol without intravenous contrast. Multiplanar CT image reconstructions of the cervical spine were also generated. COMPARISON:  None. FINDINGS: CT HEAD FINDINGS Brain: Cerebral volume within normal limits for patient age. No evidence for acute intracranial hemorrhage. No findings to suggest acute large vessel territory infarct. No mass lesion, midline shift, or mass effect. Ventricles are normal in size without evidence for hydrocephalus. No extra-axial fluid collection identified. Vascular: No hyperdense vessel identified. Skull: Scalp soft tissues demonstrate no acute abnormality. Calvarium intact. Sinuses/Orbits: Globes and orbital soft tissues within normal limits. Moderate mucosal thickening and opacity within the ethmoidal air cells and sphenoid sinuses. Paranasal sinuses are otherwise clear. No mastoid effusion. CT CERVICAL SPINE FINDINGS Alignment: Straightening of the normal cervical lordosis. No listhesis or malalignment. Skull base and vertebrae: Skull base intact. Normal C1-2 articulations are preserved in the dens is intact. Vertebral body heights maintained. No acute fracture. Soft tissues and spinal canal: Soft tissues of the neck demonstrate no acute finding. No abnormal prevertebral edema. Spinal canal within normal limits. Disc levels: Mild degenerative spondylolysis present at C4-5 and C5-6. No significant spinal stenosis within the cervical spine. Thoracic fusion hardware partially  visualized. Upper chest: Visualized upper chest limited evaluation due to streak artifact from thoracic hardware. No obvious abnormality identified. Other: None. IMPRESSION: CT BRAIN: 1. No acute intracranial abnormality. 2. Moderate sphenoid ethmoidal sinus disease. CT CERVICAL SPINE: No CT evidence for acute traumatic injury within the cervical spine. Electronically Signed   By: Jeannine Boga M.D.   On: 01/15/2018 23:10   Ct Angio Neck W Or Wo Contrast  Result Date: 01/30/2018 CLINICAL DATA:  Acute onset of left-sided weakness. EXAM: CT ANGIOGRAPHY HEAD AND NECK CT PERFUSION BRAIN TECHNIQUE: Multidetector CT imaging of the head and neck was performed using the standard protocol during bolus administration of intravenous contrast. Multiplanar CT image reconstructions and MIPs were obtained to evaluate the vascular anatomy. Carotid stenosis measurements (when applicable) are obtained utilizing NASCET criteria, using the distal internal carotid diameter as the denominator. Multiphase CT imaging of the brain was performed following IV bolus contrast injection. Subsequent parametric perfusion maps were calculated using RAPID software. CONTRAST:  143m ISOVUE-370 IOPAMIDOL (ISOVUE-370) INJECTION 76% COMPARISON:  Head CT without contrast 01/30/2018 and 01/26/2018. FINDINGS:  CTA NECK FINDINGS Aortic arch: A 3 vessel arch configuration is present. No significant atherosclerotic calcifications or stenosis is present. Right carotid system: The right common carotid artery is within normal limits. The bifurcation is unremarkable. Cervical right ICA is normal Left carotid system: The left common carotid artery is within normal limits. Left bifurcation is unremarkable. There is some tortuosity of the distal cervical left ICA without significant stenosis. Vertebral arteries: The right vertebral artery is dominant. Left vertebral artery is hypoplastic. Both vertebral arteries originate from the subclavian arteries  without significant stenosis. There is no significant stenosis of either vertebral artery in the neck. Skeleton: Leftward curvature is present in the upper thoracic spine. There is mild rightward compensating curvature of the cervical spine. AP alignment is anatomic. There straightening of the normal cervical lordosis. Spinal fusion begins at T1. Other neck: The soft tissues of the neck are otherwise unremarkable. No focal mucosal lesions are present. Subcentimeter thyroid nodules are noted. No significant adenopathy is present. Salivary glands are within normal limits. Upper chest: Lung apices are clear. Review of the MIP images confirms the above findings CTA HEAD FINDINGS Anterior circulation: The internal carotid arteries are within normal limits from the skull base through the ICA termini. There is mild irregularity of the M1 segments bilaterally without a significant stenosis or occlusion. MCA bifurcations are intact bilaterally. Mild diffuse small vessel disease is advanced for age. ACA branch vessels are intact. Posterior circulation: The right vertebral artery is dominant. PICA origins are visualized and normal. Vertebrobasilar junction is. Basilar artery is small, essentially terminating at the superior cerebellar arteries. There is a small right P1 segment. Posterior communicating arteries are present bilaterally. Venous sinuses: Dural sinuses are patent. Straight sinus deep cerebral pains are intact. Cortical veins are unremarkable. Anatomic variants: Fetal type left posterior cerebral artery and dominant right posterior communicating artery. Review of the MIP images confirms the above findings CT Brain Perfusion Findings: CBF (<30%) Volume: 53m Perfusion (Tmax>6.0s) volume: 059mMismatch Volume: 16m47mnfarction Location:N/a IMPRESSION: 1. No focal ischemia or tissue at risk.  Normal CT brain perfusion. 2. No emergent large vessel occlusion. 3. Medium and small vessel irregularity is noted within the  anterior circulation. Vessels are small. Question underlying vasculitis. 4. Tortuosity of the distal cervical left ICA. There is no significant stenosis. This is commonly seen in the setting of hypertension. 5. Scoliosis. Electronically Signed   By: ChrSan MorelleD.   On: 01/30/2018 10:22   Ct Cervical Spine Wo Contrast  Result Date: 01/15/2018 CLINICAL DATA:  Initial evaluation for acute headache, falls. EXAM: CT HEAD WITHOUT CONTRAST CT CERVICAL SPINE WITHOUT CONTRAST TECHNIQUE: Multidetector CT imaging of the head and cervical spine was performed following the standard protocol without intravenous contrast. Multiplanar CT image reconstructions of the cervical spine were also generated. COMPARISON:  None. FINDINGS: CT HEAD FINDINGS Brain: Cerebral volume within normal limits for patient age. No evidence for acute intracranial hemorrhage. No findings to suggest acute large vessel territory infarct. No mass lesion, midline shift, or mass effect. Ventricles are normal in size without evidence for hydrocephalus. No extra-axial fluid collection identified. Vascular: No hyperdense vessel identified. Skull: Scalp soft tissues demonstrate no acute abnormality. Calvarium intact. Sinuses/Orbits: Globes and orbital soft tissues within normal limits. Moderate mucosal thickening and opacity within the ethmoidal air cells and sphenoid sinuses. Paranasal sinuses are otherwise clear. No mastoid effusion. CT CERVICAL SPINE FINDINGS Alignment: Straightening of the normal cervical lordosis. No listhesis or malalignment. Skull base and vertebrae: Skull base  intact. Normal C1-2 articulations are preserved in the dens is intact. Vertebral body heights maintained. No acute fracture. Soft tissues and spinal canal: Soft tissues of the neck demonstrate no acute finding. No abnormal prevertebral edema. Spinal canal within normal limits. Disc levels: Mild degenerative spondylolysis present at C4-5 and C5-6. No significant  spinal stenosis within the cervical spine. Thoracic fusion hardware partially visualized. Upper chest: Visualized upper chest limited evaluation due to streak artifact from thoracic hardware. No obvious abnormality identified. Other: None. IMPRESSION: CT BRAIN: 1. No acute intracranial abnormality. 2. Moderate sphenoid ethmoidal sinus disease. CT CERVICAL SPINE: No CT evidence for acute traumatic injury within the cervical spine. Electronically Signed   By: Jeannine Boga M.D.   On: 01/15/2018 23:10   Mr Brain Wo Contrast  Result Date: 01/30/2018 CLINICAL DATA:  Follow-up examination for acute stroke, left-sided weakness. Recent history of bacteremia with meningitis. EXAM: MRI HEAD WITHOUT CONTRAST TECHNIQUE: Multiplanar, multiecho pulse sequences of the brain and surrounding structures were obtained without intravenous contrast. COMPARISON:  Prior CT and CTA from earlier the same day. FINDINGS: Brain: Cerebral volume within normal limits for age. Few scattered areas of diffusion abnormality seen involving the bilateral cerebral hemispheres, suspicious for acute to early subacute ischemic infarcts. These involve the subcortical and deep white matter of the posterior right frontal region (series 5, image 90, 88). Involvement of the subcortical left frontal white matter (series 5, image 86). Associated small amount of susceptibility artifact at this location consistent with petechial hemorrhage (series 12, image 36). 14 mm focus of diffusion at the right thalamus/genu of the right internal capsule (series 5, image 81). Patchy infarcts also seen involving the peripheral right cerebellum near the brachium pontis (series 5, image 68) as well as the cerebellar vermis (series 5, image 70). Additional diffusion abnormality seen overlying the surface of the high right cerebral convexity (series 5, image 97, 94). Additional diffusion abnormality present at the suprasellar cistern (series 5, image 77). Probable mild  signal abnormality surrounding the midbrain and perimesencephalic cistern as well. Constellation of findings favored to reflect sequelae of meningitis with associated pial exudate and multifocal infarcts. No other associated hemorrhage. Few additional scattered punctate micro hemorrhages seen scattered within the brain, likely related to history of hypertension. No frank intracerebral abscess or empyema identified on this noncontrast examination. No mass lesion, midline shift, or significant mass effect. Ventricles normal size without hydrocephalus. Pituitary gland suprasellar region normal. Midline structures intact and normal. Vascular: Major intracranial vascular flow voids maintained. Skull and upper cervical spine: Craniocervical junction within normal limits. Visualized upper cervical spine unremarkable. No focal marrow replacing lesion. Scalp soft tissues demonstrate no acute finding. Sinuses/Orbits: Globes and orbital soft tissues within normal limits. Left sphenoid sinus completely opacified. Scattered mucosal thickening throughout the remaining paranasal sinuses. Small right mastoid effusion noted. Inner ear structures within normal limits. Other: None. IMPRESSION: Scattered diffusion abnormality overlying the right cerebral convexity as well as the suprasellar cistern, most concerning for pial exudate related to meningitis given provided history. Associated scattered multifocal acute to early subacute ischemic infarcts as detailed above. Specific note made of diffusion abnormality at the ventral right thalamus/internal capsule, which could account for patient's left-sided symptoms. Correlation with CSF analysis suggested. Electronically Signed   By: Jeannine Boga M.D.   On: 01/30/2018 14:41   Mr Brain W Contrast  Result Date: 01/30/2018 CLINICAL DATA:  Initial evaluation for bacterial meningitis. EXAM: MRI HEAD WITH CONTRAST TECHNIQUE: Multiplanar, multiecho pulse sequences of the brain and  surrounding structures were obtained with intravenous contrast. CONTRAST:  4 cc of Gadavist. COMPARISON:  Prior noncontrast MRI from earlier the same day. FINDINGS: Postcontrast imaging demonstrates scattered left meningeal and dural enhancement overlying the right cerebral convexity, corresponding with previously seen diffusion abnormality, compatible with meningitis. There are a few scattered small areas of focal cortical enhancement within the underlying right frontal and parietal lobes (series 6, images 49, 50), consistent with associated cerebritis. Similar leptomeningeal enhancement noted about the perimesencephalic cistern and midbrain, extending into the suprasellar cistern. Patchy enhancement within the cerebellar vermis consistent with associated cerebritis (series 6, images 25, 29). Additionally, there are scattered multifocal foci of enhancement seen involving the subcortical and deep white matter of both cerebral hemispheres, some of which correspond with previously seen infarcts. These are likely perivascular in nature, and also compatible with meningitis (series 7, images 14, 20). No frank intracerebral abscess or empyema. No significant ependymal enhancement to suggest ventriculitis. No hydrocephalus. No findings to suggest venous thrombosis. No significant mass effect or midline shift. IMPRESSION: Scattered leptomeningeal enhancement overlying the right cerebral convexity and about the perimesencephalic and supra cerebellar cisterns, most consistent with meningitis. Associated patchy post-contrast enhancement within the bilateral cerebral hemispheres and cerebellum as above, compatible with associated cerebritis. No frank intracerebral abscess or empyema at this time. Electronically Signed   By: Jeannine Boga M.D.   On: 01/30/2018 15:55   Ct Abdomen Pelvis W Contrast  Result Date: 01/15/2018 CLINICAL DATA:  Status post fall few days ago with acute generalized abdomen pain EXAM: CT  ABDOMEN AND PELVIS WITH CONTRAST TECHNIQUE: Multidetector CT imaging of the abdomen and pelvis was performed using the standard protocol following bolus administration of intravenous contrast. CONTRAST:  16m ISOVUE-300 IOPAMIDOL (ISOVUE-300) INJECTION 61%, 322mOMNIPAQUE IOHEXOL 300 MG/ML SOLN COMPARISON:  None. FINDINGS: Lower chest: Minimal bilateral pleural effusions are noted. Hepatobiliary: Patchy low density is identified near the falciform ligament probably due to focal fatty infiltration of liver. The liver is otherwise normal. The gallbladder is normal. The biliary tree is normal. Pancreas: Unremarkable. No pancreatic ductal dilatation or surrounding inflammatory changes. Spleen: Normal in size without focal abnormality. Adrenals/Urinary Tract: The bilateral adrenal glands are normal. The right kidney is normal. In the lower pole left kidney, there is a heterogeneous enhancing mass measuring 2.1 x 2.7 cm. There is no hydronephrosis bilaterally. The bladder is normal. Stomach/Bowel: Stomach is within normal limits. The appendix is not definitely seen but no inflammation is noted around cecum. No evidence of bowel wall thickening, distention, or inflammatory changes. Vascular/Lymphatic: No significant vascular findings are present. No enlarged abdominal or pelvic lymph nodes. Reproductive: Uterus and bilateral adnexa are unremarkable. Other: None. Musculoskeletal: Patient status post prior fixation of thoracic and upper lumbar spine. IMPRESSION: No acute abnormality identified in the abdomen pelvis. Heterogeneous enhancing mass in the lower pole left kidney measuring 2.1 x 2.7 cm. Recommend further evaluation with renal MRI on outpatient basis as neoplasm of the kidney is not excluded. Electronically Signed   By: WeAbelardo Diesel.D.   On: 01/15/2018 22:37   Ct Cerebral Perfusion W Contrast  Result Date: 01/30/2018 CLINICAL DATA:  Acute onset of left-sided weakness. EXAM: CT ANGIOGRAPHY HEAD AND NECK CT  PERFUSION BRAIN TECHNIQUE: Multidetector CT imaging of the head and neck was performed using the standard protocol during bolus administration of intravenous contrast. Multiplanar CT image reconstructions and MIPs were obtained to evaluate the vascular anatomy. Carotid stenosis measurements (when applicable) are obtained utilizing NASCET criteria, using the distal  internal carotid diameter as the denominator. Multiphase CT imaging of the brain was performed following IV bolus contrast injection. Subsequent parametric perfusion maps were calculated using RAPID software. CONTRAST:  175m ISOVUE-370 IOPAMIDOL (ISOVUE-370) INJECTION 76% COMPARISON:  Head CT without contrast 01/30/2018 and 01/26/2018. FINDINGS: CTA NECK FINDINGS Aortic arch: A 3 vessel arch configuration is present. No significant atherosclerotic calcifications or stenosis is present. Right carotid system: The right common carotid artery is within normal limits. The bifurcation is unremarkable. Cervical right ICA is normal Left carotid system: The left common carotid artery is within normal limits. Left bifurcation is unremarkable. There is some tortuosity of the distal cervical left ICA without significant stenosis. Vertebral arteries: The right vertebral artery is dominant. Left vertebral artery is hypoplastic. Both vertebral arteries originate from the subclavian arteries without significant stenosis. There is no significant stenosis of either vertebral artery in the neck. Skeleton: Leftward curvature is present in the upper thoracic spine. There is mild rightward compensating curvature of the cervical spine. AP alignment is anatomic. There straightening of the normal cervical lordosis. Spinal fusion begins at T1. Other neck: The soft tissues of the neck are otherwise unremarkable. No focal mucosal lesions are present. Subcentimeter thyroid nodules are noted. No significant adenopathy is present. Salivary glands are within normal limits. Upper chest:  Lung apices are clear. Review of the MIP images confirms the above findings CTA HEAD FINDINGS Anterior circulation: The internal carotid arteries are within normal limits from the skull base through the ICA termini. There is mild irregularity of the M1 segments bilaterally without a significant stenosis or occlusion. MCA bifurcations are intact bilaterally. Mild diffuse small vessel disease is advanced for age. ACA branch vessels are intact. Posterior circulation: The right vertebral artery is dominant. PICA origins are visualized and normal. Vertebrobasilar junction is. Basilar artery is small, essentially terminating at the superior cerebellar arteries. There is a small right P1 segment. Posterior communicating arteries are present bilaterally. Venous sinuses: Dural sinuses are patent. Straight sinus deep cerebral pains are intact. Cortical veins are unremarkable. Anatomic variants: Fetal type left posterior cerebral artery and dominant right posterior communicating artery. Review of the MIP images confirms the above findings CT Brain Perfusion Findings: CBF (<30%) Volume: 068mPerfusion (Tmax>6.0s) volume: 35m335mismatch Volume: 35mL48mfarction Location:N/a IMPRESSION: 1. No focal ischemia or tissue at risk.  Normal CT brain perfusion. 2. No emergent large vessel occlusion. 3. Medium and small vessel irregularity is noted within the anterior circulation. Vessels are small. Question underlying vasculitis. 4. Tortuosity of the distal cervical left ICA. There is no significant stenosis. This is commonly seen in the setting of hypertension. 5. Scoliosis. Electronically Signed   By: ChriSan Morelle.   On: 01/30/2018 10:22   Dg Chest Port 1 View  Result Date: 01/15/2018 CLINICAL DATA:  Pt reports generalized weakness, dizziness with n/v and h/a Sunday night. Went to a clinic yesterday for her h/a and was given 2 injections with some relief. Her father reports slurred speech, LSN was 2 days ago. EXAM: PORTABLE  CHEST 1 VIEW COMPARISON:  08/27/2016 FINDINGS: Shallow lung inflation. Heart size is normal. No focal consolidations or pleural effusions. No pulmonary edema. Thoracolumbar spinal fixation rods. IMPRESSION: No evidence for acute cardiopulmonary abnormality. Electronically Signed   By: ElizNolon Nations.   On: 01/15/2018 18:03   Ct Head Code Stroke Wo Contrast  Result Date: 01/30/2018 CLINICAL DATA:  Code stroke. Acute onset of left-sided weakness beginning 2 hours ago. EXAM: CT HEAD WITHOUT CONTRAST TECHNIQUE:  Contiguous axial images were obtained from the base of the skull through the vertex without intravenous contrast. COMPARISON:  None. FINDINGS: Brain: No acute infarct, hemorrhage, or mass lesion is present. No significant white matter lesions are present. Basal ganglia and insular ribbon are normal bilaterally. No acute or focal cortical abnormality is present. The brainstem and cerebellum are within normal limits. Vascular: No hyperdense vessel or unexpected calcification. Skull: Calvarium is intact. No focal lytic or blastic lesions are present. Sinuses/Orbits: Previous facial surgery is again noted. A posterior right ethmoid air cell and the left sphenoid sinus are chronically opacified. Mastoid air cells are patent. The globes and orbits are within normal limits. ASPECTS [facility] Stroke Program Early CT Score) - Ganglionic level infarction (caudate, lentiform nuclei, internal capsule, insula, M1-M3 cortex): 7/7 - Supraganglionic infarction (M4-M6 cortex): 3/3 Total score (0-10 with 10 being normal): 10/10 IMPRESSION: 1. Normal CT appearance of the brain. 2. Postsurgical changes of the anterior maxillary sinuses and nasal bones. 3. Stable chronic sinus opacification. 4. ASPECTS is 10/10 Electronically Signed   By: San Morelle M.D.   On: 01/30/2018 09:51   [place] Ekg Site Rite  Result Date: 02/03/2018 If Site Rite image not attached, placement could not be confirmed due to current cardiac  rhythm.  [place] Ekg Site Rite  Result Date: 01/19/2018 If Site Rite image not attached, placement could not be confirmed due to current cardiac rhythm.  US Abdomen Limited Ruq  Result Date: 01/15/2018 CLINICAL DATA:  Right upper quadrant pain x5 days. EXAM: ULTRASOUND ABDOMEN LIMITED RIGHT UPPER QUADRANT COMPARISON:  None. FINDINGS: Gallbladder: No gallstones or wall thickening visualized. No sonographic Murphy sign noted by sonographer. Common bile duct: Diameter: 2.7 mm Liver: No focal lesion identified. Within normal limits in parenchymal echogenicity. Portal vein is patent on color Doppler imaging with normal direction of blood flow towards the liver. Other: Relative to the liver, the right adjacent kidney is slightly echogenic but maintains its cortical-medullary distinction. The possibility of mild medical renal disease is not entirely excluded. IMPRESSION: Unremarkable right upper quadrant abdominal ultrasound. Slightly echogenic appearance of the right kidney relative to the liver, a nonspecific finding but can be seen in medical renal disease. Electronically Signed   By: Ashley Royalty M.D.   On: 01/15/2018 18:53   TEE 02/02/2018 ------------------------------------------------------------------- Study Conclusions  - History: Bicuspid aortic valve, Turner&'s syndrome, pneumococcal meningitis, multiple acute strokes. - Left ventricle: The cavity size was normal. Wall thickness was normal. Systolic function was normal. The estimated ejection fraction was in the range of 55% to 60%. - Aortic valve: Bicuspid with fusion of right and left coronary cusps, raphe present. There was mild to moderate regurgitation (vena contracta 0.4cm). No vegetations or thrombi. - Aorta: Mid-ascending aortic diameter: 35.53 mm (ED). - Mitral valve: No evidence of vegetation. There was mild regurgitation. - Left atrium: No evidence of thrombus in the atrial cavity or appendage. - Right ventricle:  The cavity size was normal. Wall thickness was increased. - Right atrium: The atrium was normal in size. - Atrial septum: No defect or patent foramen ovale was identified. Echo contrast study showed no right-to-left atrial level shunt. - Tricuspid valve: No evidence of vegetation. There was trivial regurgitation. - Pulmonic valve: No evidence of vegetation. - Pericardium, extracardiac: A trivial pericardial effusion was identified. - A mild narrowing is seen in the descending thoraocic aorta just distal to the aortic arch, suggestive of possible coarctation of the aorta. There no hemodynamically significant stenosis in this region. No  adherent thrombi or vegetations.  Impressions:  - No cardiac source of emboli was identified. No valviular vegetations.  Subjective: Seen and examined at bedside and she is severely hard of hearing and had to communicate with her via text and she responded verbally.  No chest pain, nausea or vomiting states that she is getting anxious.  No other concerns or complaints at this time. Ready to go to [facility].  Discharge Exam: Vitals:   02/03/18 0442 02/03/18 0719  BP: (!) 148/91 140/89  Pulse: 90 90  Resp: 13 15  Temp: (!) 97.5 F (36.4 C) 99.3 F (37.4 C)  SpO2: 100% 100%   Vitals:   02/02/18 2027 02/02/18 2301 02/03/18 0442 02/03/18 0719  BP: (!) 148/83 139/77 (!) 148/91 140/89  Pulse: 99 93 90 90  Resp: _0 Temp: 98.7 F (37.1 C) 98.2 F (36.8 C) (!) 97.5 F (36.4 C) 99.3 F (37.4 C)  TempSrc: Oral Axillary Axillary Oral  SpO2: 100% 100% 100% 100%  Weight:      Height:       Constitutional: Caucasian NAD and appears calm and comfortable Eyes: Lids and conjunctivae normal, sclerae anicteric  ENMT: External Ears, Nose appear normal. Very hard of hearing  Neck: Appears normal, supple, no cervical masses, normal ROM, no appreciable thyromegaly; no JVD Respiratory: Diminished to auscultation bilaterally, no wheezing,  rales, rhonchi or crackles. Normal respiratory effort and patient is not tachypenic. No accessory muscle use.  Cardiovascular: RRR, Has a 2/6 Murmur. No extremity edema. 2+ pedal pulses. No carotid bruits.  Abdomen: Soft, non-tender, non-distended. No masses palpated. No appreciable hepatosplenomegaly. Bowel sounds positive x4.  GU: Deferred. Musculoskeletal: No clubbing / cyanosis of digits/nails. No joint deformity upper and lower extremities Skin: No rashes, lesions, ulcers on a limited skin evaluation. No induration; Warm and dry.  Neurologic: CN 2-12 grossly intact with no focal deficits.  Romberg sign and cerebellar reflexes not assessed.  Psychiatric: Normal judgment and insight. Awake and alert.. Slightly anxious and appropriate affect.    The results of significant diagnostics from this hospitalization (including imaging, microbiology, ancillary and laboratory) are listed below for reference.    Microbiology: Recent Results (from the past 240 hour(s))  Culture, blood (routine x 2)     Status: None (Preliminary result)   Collection Time: 01/30/18  5:25 PM  Result Value Ref Range Status   Specimen Description BLOOD LEFT FOREARM  Final   Special Requests   Final    BOTTLES DRAWN AEROBIC AND ANAEROBIC Blood Culture adequate volume   Culture   Final    NO GROWTH 4 DAYS Performed at [facility], [address], [place], [place] [address]    Report Status PENDING  Incomplete  Culture, blood (routine x 2)     Status: None (Preliminary result)   Collection Time: 01/30/18  5:28 PM  Result Value Ref Range Status   Specimen Description BLOOD LEFT ANTECUBITAL  Final   Special Requests   Final    BOTTLES DRAWN AEROBIC AND ANAEROBIC Blood Culture adequate volume   Culture   Final    NO GROWTH 4 DAYS Performed at [facility], [place] [address], [place], [place] [address]    Report Status PENDING  Incomplete  MRSA PCR Screening     Status: None   Collection Time: 01/30/18   6:02 PM  Result Value Ref Range Status   MRSA by PCR NEGATIVE NEGATIVE Final    Comment:  The GeneXpert MRSA Assay (FDA approved for NASAL specimens only), is one component of a comprehensive MRSA colonization surveillance program. It is not intended to diagnose MRSA infection nor to guide or monitor treatment for MRSA infections. Performed at [facility], [place] [address] [place], [place] [address]     Labs: BNP (last 3 results) No results for input(s): BNP in the last 8760 hours. Basic Metabolic Panel: Recent Labs  Lab 01/30/18 0927 01/30/18 0934 02/02/18 0441 02/03/18 0843  NA 136 136 133* 133*  K 3.5 3.6 3.3* 3.6  CL 100 98 96* 99  CO2 26  --  27 24  GLUCOSE 97 89 101* 98  BUN _0 CREATININE 0.43* 0.20* 0.48 0.40*  CALCIUM 8.8*  --  8.5* 8.3*  MG  --   --   --  1.7  PHOS  --   --   --  2.6   Liver Function Tests: Recent Labs  Lab 01/30/18 0927 02/03/18 0843  AST 184* 39  ALT 386* 187*  ALKPHOS 216* 148*  BILITOT 0.9 0.4  PROT 6.8 5.8*  ALBUMIN 2.9* 2.6*   No results for input(s): LIPASE, AMYLASE in the last 168 hours. No results for input(s): AMMONIA in the last 168 hours. CBC: Recent Labs  Lab 01/30/18 0927 01/30/18 0934 02/03/18 0843  WBC 10.4  --  7.5  NEUTROABS 8.5*  --  5.8  HGB 12.4 13.3 12.2  HCT 38.8 39.0 36.2  MCV 88.0  --  86.6  PLT 327  --  258   Cardiac Enzymes: No results for input(s): CKTOTAL, CKMB, CKMBINDEX, TROPONINI in the last 168 hours. BNP: Invalid input(s): POCBNP CBG: Recent Labs  Lab 01/30/18 0928  GLUCAP 76   D-Dimer No results for input(s): DDIMER in the last 72 hours. Hgb A1c No results for input(s): HGBA1C in the last 72 hours. Lipid Profile No results for input(s): CHOL, HDL, LDLCALC, TRIG, CHOLHDL, LDLDIRECT in the last 72 hours. Thyroid function studies No results for input(s): TSH, T4TOTAL, T3FREE, THYROIDAB in the last 72 hours.  Invalid input(s): FREET3 Anemia work up No results  for input(s): VITAMINB12, FOLATE, FERRITIN, TIBC, IRON, RETICCTPCT in the last 72 hours. Urinalysis    Component Value Date/Time   COLORURINE YELLOW 01/15/2018 1905   APPEARANCEUR HAZY (A) 01/15/2018 1905   LABSPEC 1.020 01/15/2018 1905   LABSPEC 1.010 11/24/2006 1033   PHURINE 6.0 01/15/2018 1905   GLUCOSEU NEGATIVE 01/15/2018 1905   HGBUR SMALL (A) 01/15/2018 1905   BILIRUBINUR NEGATIVE 01/15/2018 1905   BILIRUBINUR Negative 11/24/2006 1033   KETONESUR 5 (A) 01/15/2018 1905   PROTEINUR 100 (A) 01/15/2018 1905   NITRITE NEGATIVE 01/15/2018 1905   LEUKOCYTESUR NEGATIVE 01/15/2018 1905   LEUKOCYTESUR Negative 11/24/2006 1033   Sepsis Labs Invalid input(s): PROCALCITONIN,  WBC,  LACTICIDVEN Microbiology Recent Results (from the past 240 hour(s))  Culture, blood (routine x 2)     Status: None (Preliminary result)   Collection Time: 01/30/18  5:25 PM  Result Value Ref Range Status   Specimen Description BLOOD LEFT FOREARM  Final   Special Requests   Final    BOTTLES DRAWN AEROBIC AND ANAEROBIC Blood Culture adequate volume   Culture   Final    NO GROWTH 4 DAYS Performed at [facility], [place] [address], [place], [place] [address]    Report Status PENDING  Incomplete  Culture, blood (routine x 2)     Status: None (Preliminary result)  Collection Time: 01/30/18  5:28 PM  Result Value Ref Range Status   Specimen Description BLOOD LEFT ANTECUBITAL  Final   Special Requests   Final    BOTTLES DRAWN AEROBIC AND ANAEROBIC Blood Culture adequate volume   Culture   Final    NO GROWTH 4 DAYS Performed at [facility], [address], [place], [place] [address]    Report Status PENDING  Incomplete  MRSA PCR Screening     Status: None   Collection Time: 01/30/18  6:02 PM  Result Value Ref Range Status   MRSA by PCR NEGATIVE NEGATIVE Final    Comment:        The GeneXpert MRSA Assay (FDA approved for NASAL specimens only), is one component of a comprehensive MRSA  colonization surveillance program. It is not intended to diagnose MRSA infection nor to guide or monitor treatment for MRSA infections. Performed at [facility], [place] [address], [place], [place] [address]    Time coordinating discharge: 35 minutes  SIGNED:  Kerney Elbe, DO [facility] 02/03/2018, 2:04 PM Pager is on [place]  If 7PM-7AM, please contact night-coverage [email or web address] Password [email or web address]

## 2018-02-03 NOTE — Progress Notes (Signed)
Pharmacy Antibiotic Note  Emma Martin is a 38 y.o. female admitted on 01/30/2018 with left sided weakness and found to have multiple acute strokes concern for embolic phenomenon possibly related to an infectious source. Pt completed a course of Rocephin + Vancomycin on 01/29/2018 for strep pna bacteremia and meningitis.  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   Vancomycin trough is 8 on Vancomycin 1250 mg IV q8h regimen.  This is subtherapeutic and no doses were missed. Trough was drawn approximately 8.5 hours after last dose so true trough likely slightly higher.   Plan: Increase Vancomycin to 750 mg IV Q 6 hours  Continue Rocephin 2gm IV q12h. Continue both antibiotics through 02/26/18 Recommend collecting repeat VT tomorrow   Height: 5\' 1"  (154.9 cm) Weight: 111 lb 15.9 oz (50.8 kg) IBW/kg (Calculated) : 47.8  Temp (24hrs), Avg:98.4 F (36.9 C), Min:97.5 F (36.4 C), Max:99.3 F (37.4 C)  Recent Labs  Lab 01/30/18 0927 01/30/18 0934 01/30/18 1719 02/01/18 1906 02/02/18 0441 02/03/18 0843 02/03/18 1435  WBC 10.4  --   --   --   --  7.5  --   CREATININE 0.43* 0.20*  --   --  0.48 0.40*  --   VANCOTROUGH  --   --   --  11*  --   --  8*  VANCORANDOM  --   --  <4  --   --   --   --     Estimated Creatinine Clearance: 73.4 mL/min (A) (by C-G formula based on SCr of 0.4 mg/dL (L)).    Allergies  Allergen Reactions  . Benzoin Rash    Antimicrobials this admission: Vanc 1/11 >> Rocephin 1/11 >>  Dose adjustments this admission: 02/01/2018 VT 11 >> dose increased to 1250mg  IV q8h 02/03/2018 VT 8 >>>dose increased to 750 mg IV Q 6h  Microbiology results: 1/11 BCx: ngtd 1/11 MRSA PCR: negative  Thank you for allowing pharmacy to be a part of this patient's care.   Jimmy Footman, PharmD, BCPS, BCIDP Infectious Diseases Clinical Pharmacist Phone: [phone] **Pharmacist phone directory can now be  found on [email or web address] ([email or web address]).  Listed under [place].  02/03/2018 5:00 PM

## 2018-02-03 NOTE — Progress Notes (Signed)
Inpatient Rehabilitation Admissions Coordinator  I met with patient with her Dad and sister at bedside. Dad would like to pursue admission to inpt rehab. I have contacted Dr. Sheikh and we will arrange d/c to CIR today. RN and RN CM made aware. I will make the arrangements to admit today. I have requested RN to facilitate getting the PICC placed prior to CIR admit.  Barbara Boyette, RN, MSN Rehab Admissions Coordinator [phone] 02/03/2018 2:06 PM  

## 2018-02-03 NOTE — Progress Notes (Signed)
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 Vancomycin dose will be adjusted as necessary when it returns.  IV antibiotic discharge orders are pended. To discharging provider:  please sign these orders via discharge navigator,  Select New Orders & click on the button choice - Manage This Unsigned Work.    Thank you for allowing pharmacy to be a part of this patient's care.  Jimmy Footman, PharmD, BCPS, BCIDP Infectious Diseases Clinical Pharmacist Phone: [phone] 02/03/2018, 5:15 PM

## 2018-02-03 NOTE — Progress Notes (Signed)
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 Vancomycin dose will be adjusted as necessary when it returns.  IV antibiotic discharge orders are pended. To discharging provider:  please sign these orders via discharge navigator,  Select New Orders & click on the button choice - Manage This Unsigned Work.    Thank you for allowing pharmacy to be a part of this patient's care.  Mila Merry Gerarda Fraction, PharmD, [place] PGY2 Infectious Diseases Pharmacy Resident Phone: [phone] 02/03/2018, 11:40 AM

## 2018-02-03 NOTE — Progress Notes (Signed)
Emma Arn, MD  Physician  Physical Medicine and Rehabilitation  Consult Note  Signed  Date of Service:  02/01/2018 10:34 AM       Related encounter: ED to Hosp-Admission (Current) from 01/30/2018 in [facility]      Signed      Expand All Collapse All    Show:Clear all _0 Manual_1 Template_2 Copied  Added by: _3 Cathlyn Parsons, PA-C_4 Emma Arn, MD  _5 Hover for details      Physical Medicine and Rehabilitation Consult Reason for Consult:  Left side weakness Referring Physician: [facility]   HPI: Emma Martin is a 38 y.o.right handed female  With history of hypertension, hypothyroidism. Turner syndrome, scoliosis status post Harrington rod placement, bicuspid aortic valve.  Per chart review, patient lives with her father. Independent prior to admission using a walker. 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 Presented 01/30/2018 with left-sided weakness. She was found on the floor her father unresponsive. Cranial CT reviewed, unremarkable for acute intracranial process. Patient did not receive TPA. CT angiogram of head and neck with no emergent large vessel occlusion. MRI/MRA showed scattered diffusion abnormality overlying the right cerebral convexity as well as the suprasellar cistern. Associated scattered multifocal acute early subacute ischemic infarcts. Most recent echocardiogram with ejection fraction of 55% no wall motion abnormalities. Patient is currently followed by infectious disease for recent bacteremia maintained on antibiotic therapy which initially had been scheduled to be completed 01/29/2018. Tolerating a regular diet. Therapy evaluations completed with recommendations of physical medicine rehabilitation consult.   Review of Systems  Unable to perform ROS:  Mental acuity       Past Medical History:  Diagnosis Date  . Bicuspid aortic valve    , mild aortic insufficiency. (No SBE prophylaxis needed)  . Complication of anesthesia   . Dyslipidemia   . Generalized anxiety disorder    , with obsessive-compulsive traits.  . Hypertension   . Hypothyroidism   . Mixed gonadal dysgenesis    , (additional Y-bearing cell line) for which she reportedly underwent bilateral gonadectomy (patient unaware; needs confirmation) due to malignancy potential.  . PONV (postoperative nausea and vomiting)   . Primary amenorrhea    , treated starting at age 89 or 19 with estrogen and progesterone  . Scoliosis    , treated with Harrington rods (1997) for stabilization.  . Seasonal allergic rhinitis   . Short stature    , previously treated with growth hormone age 29-15 years.  Radford Pax syndrome    , previously followed by pediatric endocrinologist Freddy Jaksch, MD at [facility] through her early 45s).        Past Surgical History:  Procedure Laterality Date  . TEE WITHOUT CARDIOVERSION N/A 01/19/2018   Procedure: TRANSESOPHAGEAL ECHOCARDIOGRAM (TEE);  Surgeon: Jerline Pain, MD;  Location: [facility] ENDOSCOPY;  Service: Cardiovascular;  Laterality: N/A;        Family History  Problem Relation Age of Onset  . Other Father        A + W  . Cancer Maternal Grandmother   . Cancer Paternal Grandfather   . Hypertension Mother   . Other Mother        Dyslipidemia  . Hypothyroidism Mother   . Mitral valve prolapse Mother    Social History:  reports that she has never smoked. She has never used smokeless tobacco. She reports current alcohol  use of about 1.0 - 2.0 standard drinks of alcohol per week. She reports that she does not use drugs. Allergies:      Allergies  Allergen Reactions  . Benzoin Rash         Medications Prior to Admission  Medication Sig Dispense Refill  . FLUoxetine (PROZAC) 40 MG capsule Take 40 mg  by mouth daily.    . fluticasone (FLONASE) 50 MCG/ACT nasal spray Place 2 sprays into both nostrils daily.    Marland Kitchen ibuprofen (ADVIL,MOTRIN) 200 MG tablet Take 200-400 mg by mouth every 8 (eight) hours as needed for headache.    . levothyroxine (SYNTHROID, LEVOTHROID) 75 MCG tablet Take 75 mcg by mouth daily before breakfast.    . lisinopril (PRINIVIL,ZESTRIL) 10 MG tablet Take 1 tablet (10 mg total) by mouth daily. 30 tablet 0  . Multiple Vitamins-Minerals (CENTRUM PO) Take 1 tablet by mouth daily.    . norethindrone-ethinyl estradiol-iron (MICROGESTIN FE,GILDESS FE,LOESTRIN FE) 1.5-30 MG-MCG tablet Take 1 tablet by mouth daily.    . [EXPIRED] cefTRIAXone (ROCEPHIN) IVPB Inject 2 g into the vein every 12 (twelve) hours for 10 days. Indication:  meningitis Last Day of Therapy:  01/29/2018 Labs - Once weekly:  CBC/D and BMP, Labs - Every other week:  ESR and CRP (Patient not taking: Reported on 01/30/2018) 20 Units 0  . [EXPIRED] vancomycin IVPB Inject 1,000 mg into the vein every 8 (eight) hours for 10 days. Indication:  meningitis Last Day of Therapy:  01/29/2018 Labs - Sunday/Monday:  CBC/D, BMP, and vancomycin trough. Labs - Thursday:  BMP and vancomycin trough Labs - Every other week:  ESR and CRP (Patient not taking: Reported on 01/30/2018) 30 Units 0    Home: Home Living Family/patient expects to be discharged to:: Private residence Living Arrangements: Parent Available Help at Discharge: Family, Available 24 hours/day Type of Home: House Home Access: Stairs to enter [employer] of Steps: 2 Entrance Stairs-Rails: Right, Left Home Layout: Two level, Bed/bath upstairs Alternate Level Stairs-Number of Steps: flight, with stair lift  Alternate Level Stairs-Rails: Right, Left Bathroom Shower/Tub: Multimedia programmer: Handicapped height Home Equipment: Cane - quad, Other (comment)  Functional History: Prior Function Level of Independence:  Independent Functional Status:  Mobility: Bed Mobility Overal bed mobility: Needs Assistance Bed Mobility: Rolling, Sidelying to Sit, Sit to Supine Rolling: Supervision Sidelying to sit: Supervision Transfers Overall transfer level: Needs assistance Equipment used: Rolling walker (2 wheeled), Quad cane Transfers: Sit to/from Stand Sit to Stand: Min guard, Min assist General transfer comment: Pt able to stand with min A to provide stability.  Ambulation/Gait Ambulation/Gait assistance: Min assist Gait Distance (Feet): 60 Feet(requiring intervention every 5-10' ) Assistive device: Rolling walker (2 wheeled) Gait Pattern/deviations: Staggering left, Step-to pattern, Drifts right/left General Gait Details: Pt with step to pattern, L path deviation and poor obstical avoidance. at times min A to mod A to correct and stabilize with RW  Gait velocity: decr  ADL:  Cognition: Cognition Overall Cognitive Status: Within Functional Limits for tasks assessed Orientation Level: Oriented X4 Cognition Arousal/Alertness: Awake/alert Behavior During Therapy: Flat affect Overall Cognitive Status: Within Functional Limits for tasks assessed General Comments: pt AO4, exhibiting potential poor insights into deficits, understating weakness and balance deficits, communication tonight through voice to text, pt reading then responding. able to follow all cues. woudl benefit from further cognitive assessment   Blood pressure (!) 150/97, pulse (!) 101, temperature 97.8 F (36.6 C), temperature source Oral, resp. rate 18, height _0  (  1.549 m), weight 50.8 kg, SpO2 99 %. Physical Exam  Vitals reviewed. Constitutional: She appears well-developed and well-nourished.  HENT:  Head: Normocephalic and atraumatic.  Eyes: EOM are normal. Right eye exhibits no discharge. Left eye exhibits no discharge.  Pupils sluggish but reactive to light  Neck: Normal range of motion. Neck supple. No thyromegaly present.   Cardiovascular: Normal rate and regular rhythm.  Respiratory: Effort normal and breath sounds normal. No respiratory distress.  GI: Soft. Bowel sounds are normal. She exhibits no distension.  Musculoskeletal:     Comments: No edema or tenderness in extremities  Neurological: She is alert.  Patient makes eye contact with examiner.  She is deaf.  Motor: limited due to participation, inconsistently follows commands, grossly 4+/5 throughout  Skin: Skin is warm and dry.  Psychiatric:  Unable to assess due to mentation    LabResultsLast24Hours  No results found for this or any previous visit (from the past 24 hour(s)).    ImagingResults(Last48hours)  Mr Brain Wo Contrast  Result Date: 01/30/2018 CLINICAL DATA:  Follow-up examination for acute stroke, left-sided weakness. Recent history of bacteremia with meningitis. EXAM: MRI HEAD WITHOUT CONTRAST TECHNIQUE: Multiplanar, multiecho pulse sequences of the brain and surrounding structures were obtained without intravenous contrast. COMPARISON:  Prior CT and CTA from earlier the same day. FINDINGS: Brain: Cerebral volume within normal limits for age. Few scattered areas of diffusion abnormality seen involving the bilateral cerebral hemispheres, suspicious for acute to early subacute ischemic infarcts. These involve the subcortical and deep white matter of the posterior right frontal region (series 5, image 90, 88). Involvement of the subcortical left frontal white matter (series 5, image 86). Associated small amount of susceptibility artifact at this location consistent with petechial hemorrhage (series 12, image 36). 14 mm focus of diffusion at the right thalamus/genu of the right internal capsule (series 5, image 81). Patchy infarcts also seen involving the peripheral right cerebellum near the brachium pontis (series 5, image 68) as well as the cerebellar vermis (series 5, image 70). Additional diffusion abnormality seen overlying the  surface of the high right cerebral convexity (series 5, image 97, 94). Additional diffusion abnormality present at the suprasellar cistern (series 5, image 77). Probable mild signal abnormality surrounding the midbrain and perimesencephalic cistern as well. Constellation of findings favored to reflect sequelae of meningitis with associated pial exudate and multifocal infarcts. No other associated hemorrhage. Few additional scattered punctate micro hemorrhages seen scattered within the brain, likely related to history of hypertension. No frank intracerebral abscess or empyema identified on this noncontrast examination. No mass lesion, midline shift, or significant mass effect. Ventricles normal size without hydrocephalus. Pituitary gland suprasellar region normal. Midline structures intact and normal. Vascular: Major intracranial vascular flow voids maintained. Skull and upper cervical spine: Craniocervical junction within normal limits. Visualized upper cervical spine unremarkable. No focal marrow replacing lesion. Scalp soft tissues demonstrate no acute finding. Sinuses/Orbits: Globes and orbital soft tissues within normal limits. Left sphenoid sinus completely opacified. Scattered mucosal thickening throughout the remaining paranasal sinuses. Small right mastoid effusion noted. Inner ear structures within normal limits. Other: None. IMPRESSION: Scattered diffusion abnormality overlying the right cerebral convexity as well as the suprasellar cistern, most concerning for pial exudate related to meningitis given provided history. Associated scattered multifocal acute to early subacute ischemic infarcts as detailed above. Specific note made of diffusion abnormality at the ventral right thalamus/internal capsule, which could account for patient's left-sided symptoms. Correlation with CSF analysis suggested. Electronically Signed   By: Marland Kitchen  Jeannine Boga M.D.   On: 01/30/2018 14:41   Mr Brain W Contrast  Result  Date: 01/30/2018 CLINICAL DATA:  Initial evaluation for bacterial meningitis. EXAM: MRI HEAD WITH CONTRAST TECHNIQUE: Multiplanar, multiecho pulse sequences of the brain and surrounding structures were obtained with intravenous contrast. CONTRAST:  4 cc of Gadavist. COMPARISON:  Prior noncontrast MRI from earlier the same day. FINDINGS: Postcontrast imaging demonstrates scattered left meningeal and dural enhancement overlying the right cerebral convexity, corresponding with previously seen diffusion abnormality, compatible with meningitis. There are a few scattered small areas of focal cortical enhancement within the underlying right frontal and parietal lobes (series 6, images 49, 50), consistent with associated cerebritis. Similar leptomeningeal enhancement noted about the perimesencephalic cistern and midbrain, extending into the suprasellar cistern. Patchy enhancement within the cerebellar vermis consistent with associated cerebritis (series 6, images 25, 29). Additionally, there are scattered multifocal foci of enhancement seen involving the subcortical and deep white matter of both cerebral hemispheres, some of which correspond with previously seen infarcts. These are likely perivascular in nature, and also compatible with meningitis (series 7, images 14, 20). No frank intracerebral abscess or empyema. No significant ependymal enhancement to suggest ventriculitis. No hydrocephalus. No findings to suggest venous thrombosis. No significant mass effect or midline shift. IMPRESSION: Scattered leptomeningeal enhancement overlying the right cerebral convexity and about the perimesencephalic and supra cerebellar cisterns, most consistent with meningitis. Associated patchy post-contrast enhancement within the bilateral cerebral hemispheres and cerebellum as above, compatible with associated cerebritis. No frank intracerebral abscess or empyema at this time. Electronically Signed   By: Jeannine Boga M.D.   On:  01/30/2018 15:55     Assessment/Plan: Diagnosis: Multifocal infarcts with meningitis Labs and images (see above) independently reviewed.  Records reviewed and summated above.  1. Does the need for close, 24 hr/day medical supervision in concert with the patient's rehab needs make it unreasonable for this patient to be served in a less intensive setting? Potentially  2. Co-Morbidities requiring supervision/potential complications: HTN (monitor and provide prns in accordance with increased physical exertion and pain), hypothyroidism (cont meds), Turner syndrome, scoliosis, bicuspid aortic valve, tachypnea (monitor RR and O2 Sats with increased physical exertion), ID (d/c IV Vanc when appropriate) 3. Due to bladder management, bowel management, safety, skin/wound care, disease management, medication administration, pain management and patient education, does the patient require 24 hr/day rehab nursing? Yes 4. Does the patient require coordinated care of a physician, rehab nurse, PT (1-2 hrs/day, 5 days/week), OT (1-2 hrs/day, 5 days/week) and SLP (1-2 hrs/day, 5 days/week) to address physical and functional deficits in the context of the above medical diagnosis(es)? Yes Addressing deficits in the following areas: balance, endurance, locomotion, strength, transferring, bathing, dressing, toileting, cognition and psychosocial support 5. Can the patient actively participate in an intensive therapy program of at least 3 hrs of therapy per day at least 5 days per week? Yes 6. The potential for patient to make measurable gains while on inpatient rehab is good 7. Anticipated functional outcomes upon discharge from inpatient rehab are modified independent and supervision  with PT, modified independent and supervision with OT, modified independent and supervision with SLP. 8. Estimated rehab length of stay to reach the above functional goals is: 4-7 days. 9. Anticipated D/C setting: Home 10. Anticipated post  D/C treatments: HH therapy and Home excercise program 11. Overall Rehab/Functional Prognosis: good  RECOMMENDATIONS: This patient's condition is appropriate for continued rehabilitative care in the following setting: CIR if patient does not progress to supervision level  of functioning after completion of medical workup. Patient has agreed to participate in recommended program. Potentially Note that insurance prior authorization may be required for reimbursement for recommended care.  Comment: Rehab Admissions Coordinator to follow up.   I have personally performed a face to face diagnostic evaluation, including, but not limited to relevant history and physical exam findings, of this patient and developed relevant assessment and plan.  Additionally, I have reviewed and concur with the physician assistant's documentation above.   Delice Lesch, MD, ABPMR Lavon Paganini Angiulli, PA-C 02/01/2018        Revision History                        Routing History

## 2018-02-03 NOTE — Progress Notes (Signed)
Physical Therapy Treatment Patient Details Name: Emma Martin MRN: [id number] DOB: 07/16/1980 Today's Date: 02/03/2018    History of Present Illness Emma Martin is a 37 y.o. female with a history of hypertension, hypothyroidism, Turner syndrome, scoliosis status post Harrington rods, bicuspid aortic valve, streptococcal meningitis and bacteremia. 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    PT Comments    Patient cont to demonstrate improvement with therapy, with cont balance, L sided inattention, coordination and cognitive deficits noted. Improving gait mechanics still with min A for stability. Cont to rec CIR.   Follow Up Recommendations  CIR;Supervision for mobility/OOB     Equipment Recommendations  None recommended by PT    Recommendations for Other Services Rehab consult;OT consult     Precautions / Restrictions Precautions Precautions: Fall Restrictions Weight Bearing Restrictions: No    Mobility  Bed Mobility Overal bed mobility: Needs Assistance   Rolling: Supervision     Sit to supine: Min assist   General bed mobility comments: Supervision for rolling, min guard for sidelying to sit. Pt with very increased effort to come to sitting, PT encouraged pt to use L forearm and R hand to push up to sitting. Min assist for lifting legs back into bed with sit to supine, and PT and pt's father assisted pt in scooting up in bed with use of bed pad.   Transfers Overall transfer level: Needs assistance Equipment used: Rolling walker (2 wheeled);Quad cane Transfers: Sit to/from Stand Sit to Stand: Min guard;Min assist         General transfer comment: standing with min A for stability. reaching for support   Ambulation/Gait Ambulation/Gait assistance: Min assist Gait Distance (Feet): (20' 20' 20' 20' 20' with intervention ) Assistive device: 1  person hand held assist Gait Pattern/deviations: Staggering left;Step-to pattern;Drifts right/left Gait velocity: decreased   General Gait Details: Pt with cont L innatention, better obsticle avoidance today, difficulty dual tasking or problem solving during gait. able to follow some cues to find room but unabel to do so indepenently. unsteady without RW, min A for stability. no overt LOB today.     Stairs             Wheelchair Mobility    Modified Rankin (Stroke Patients Only) Modified Rankin (Stroke Patients Only) Pre-Morbid Rankin Score: No significant disability Modified Rankin: Moderately severe disability     Balance Overall balance assessment: Needs assistance Sitting-balance support: No upper extremity supported;Feet unsupported Sitting balance-Leahy Scale: Fair Sitting balance - Comments: able to sit EOB with min gaurd assist    Standing balance support: Single extremity supported Standing balance-Leahy Scale: Poor                              Cognition Arousal/Alertness: Awake/alert Behavior During Therapy: Flat affect Overall Cognitive Status: Impaired/Different from baseline Area of Impairment: Attention;Memory;Following commands;Safety/judgement;Problem solving;Awareness                   Current Attention Level: Sustained Memory: Decreased short-term memory Following Commands: Follows one step commands consistently;Follows multi-step commands inconsistently(with cues) Safety/Judgement: Decreased awareness of safety;Decreased awareness of deficits Awareness: Emergent(at times anticipatory but inconsistent) Problem Solving: Difficulty sequencing;Requires verbal cues;Requires tactile cues        Exercises      General Comments General comments (skin integrity, edema, etc.): sister and father present  Pertinent Vitals/Pain Pain Assessment: No/denies pain    Home Living                      Prior Function             PT Goals (current goals can now be found in the care plan section) Acute Rehab PT Goals Patient Stated Goal: Pt wants to discharge home  PT Goal Formulation: With patient Time For Goal Achievement: 02/14/18 Potential to Achieve Goals: Good Progress towards PT goals: Progressing toward goals    Frequency    Min 4X/week      PT Plan Current plan remains appropriate    Co-evaluation PT/OT/SLP Co-Evaluation/Treatment: Yes Reason for Co-Treatment: Complexity of the patient's impairments (multi-system involvement);To address functional/ADL transfers;Necessary to address cognition/behavior during functional activity;For patient/therapist safety          AM-PAC PT "6 Clicks" Mobility   Outcome Measure  Help needed turning from your back to your side while in a flat bed without using bedrails?: None Help needed moving from lying on your back to sitting on the side of a flat bed without using bedrails?: A Little Help needed moving to and from a bed to a chair (including a wheelchair)?: A Little Help needed standing up from a chair using your arms (e.g., wheelchair or bedside chair)?: A Lot Help needed to walk in hospital room?: A Lot Help needed climbing 3-5 steps with a railing? : A Lot 6 Click Score: 16    End of Session Equipment Utilized During Treatment: Gait belt Activity Tolerance: Patient tolerated treatment well;Patient limited by fatigue;Other (comment) Patient left: with call bell/phone within reach;with family/visitor present;in bed;with bed alarm set;with SCD's reapplied Nurse Communication: Mobility status PT Visit Diagnosis: Other abnormalities of gait and mobility (R26.89);Difficulty in walking, not elsewhere classified (R26.2)     Time: 9326-7124 PT Time Calculation (min) (ACUTE ONLY): 57 min  Charges:  $Gait Training: 8-22 mins $Therapeutic Activity: 8-22 mins                     Reinaldo Berber, PT, DPT Acute Rehabilitation Services Pager:  [phone] Office: [place] 02/03/2018, 12:19 PM

## 2018-02-03 NOTE — Progress Notes (Signed)
Peripherally Inserted Central Catheter/Midline Placement  The IV Nurse has discussed with the patient and/or persons authorized to consent for the patient, the purpose of this procedure and the potential benefits and risks involved with this procedure.  The benefits include less needle sticks, lab draws from the catheter, and the patient may be discharged home with the catheter. Risks include, but not limited to, infection, bleeding, blood clot (thrombus formation), and puncture of an artery; nerve damage and irregular heartbeat and possibility to perform a PICC exchange if needed/ordered by physician.  Alternatives to this procedure were also discussed.  Bard Power PICC patient education guide, fact sheet on infection prevention and patient information card has been provided to patient /or left at bedside.    PICC/Midline Placement Documentation        Darlyn Read 02/03/2018, 4:37 PM

## 2018-02-03 NOTE — Care Management Note (Signed)
Case Management Note  Patient Details  Name: Emma Martin MRN: [id number] Date of Birth: 10/26/1980  Subjective/Objective:    Pt admitted with a stroke. She is from home with her father. Pt's insurance has lapsed. Financial counseling is following.  Pt recently completed home IV abx with [facility].                 Action/Plan: Pt discharging to CIR today. CM signing off.  Expected Discharge Date:  02/03/18               Expected Discharge Plan:  [place]  In-House Referral:     Discharge planning Services  CM Consult  Post Acute Care Choice:    Choice offered to:     DME Arranged:    DME Agency:     HH Arranged:    HH Agency:     Status of Service:  Completed, signed off  If discussed at [employer], dates discussed:    Additional Comments:  Pollie Friar, RN 02/03/2018, 2:24 PM

## 2018-02-03 NOTE — Progress Notes (Signed)
Occupational Therapy Treatment Patient Details Name: Emma Martin MRN: [id number] DOB: 12-Nov-1980 Today's Date: 02/03/2018    History of present illness Emma Martin is a 38 y.o. female with a history of hypertension, hypothyroidism, Turner syndrome, scoliosis status post Harrington rods, bicuspid aortic valve, streptococcal meningitis and bacteremia. 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   OT comments  Pt progressing towards established OT goals. Continues to present with decreased balance, left inattention, and poor cognition including decreased ST memory, attention, problem solving, and awareness. During LB dressing, pt requiring Min A and Min cues to decreased balance and cognition. Pt also requiring Max cues for simple trail making task to return to locate her room; pt also presenting with left inattention not looking at and of the signs on her left. Continue to recommend dc to CIR and will continue to follow acutely as admitted.    Follow Up Recommendations  CIR    Equipment Recommendations  None recommended by OT    Recommendations for Other Services Rehab consult    Precautions / Restrictions Precautions Precautions: Fall Restrictions Weight Bearing Restrictions: No       Mobility Bed Mobility Overal bed mobility: Needs Assistance Bed Mobility: Rolling;Sidelying to Sit;Sit to Supine Rolling: Supervision Sidelying to sit: Supervision   Sit to supine: Min assist   General bed mobility comments: supervision for safety. Requiring Min Guard A for dynamic sitting balance  Transfers Overall transfer level: Needs assistance Equipment used: Rolling walker (2 wheeled) Transfers: Sit to/from Stand Sit to Stand: Min guard;Min assist         General transfer comment: standing with min A for stability. reaching for support     Balance Overall balance  assessment: Needs assistance Sitting-balance support: No upper extremity supported;Feet unsupported Sitting balance-Leahy Scale: Fair Sitting balance - Comments: able to sit EOB with min gaurd assist    Standing balance support: Single extremity supported Standing balance-Leahy Scale: Poor Standing balance comment: requires min A to maintain balance                            ADL either performed or assessed with clinical judgement   ADL Overall ADL's : Needs assistance/impaired     Grooming: Wash/dry hands;Min guard;Standing Grooming Details (indicate cue type and reason): Min cues for use of soap. Min Guard A for safety             Lower Body Dressing: Minimal assistance;Sit to/from stand Lower Body Dressing Details (indicate cue type and reason): Min A for safety in sit<>stand. Pt requiring four attempts to don leg through the correct pant hole. Pt then requiring cues for sequencing. Toilet Transfer: Minimal assistance;Ambulation;Regular Glass blower/designer Details (indicate cue type and reason): Min A for balance and to power up  Toileting- Clothing Manipulation and Hygiene: Minimal assistance;Sit to/from stand Toileting - Clothing Manipulation Details (indicate cue type and reason): Min A for cleaning after BM.      Functional mobility during ADLs: Minimal assistance General ADL Comments: Pt continues to present with decreased balance, cognition, and vision. Pt with inattention to left visual field.      Vision   Additional Comments: Pt with left inattention. Able to track to left visual field with visual cues.    Perception     Praxis      Cognition Arousal/Alertness: Awake/alert Behavior During Therapy:  WFL for tasks assessed/performed;Flat affect Overall Cognitive Status: Impaired/Different from baseline Area of Impairment: Attention;Memory;Following commands;Safety/judgement;Problem solving;Awareness                   Current Attention  Level: Sustained Memory: Decreased short-term memory Following Commands: Follows one step commands consistently;Follows multi-step commands inconsistently(with cues) Safety/Judgement: Decreased awareness of safety;Decreased awareness of deficits Awareness: Emergent Problem Solving: Difficulty sequencing;Requires verbal cues;Requires tactile cues;Slow processing General Comments: Pt requiring Max cues during multi-step commands. Pt requiring Min cues for sequencing donning of pants correctly and required significant amount of time. During simple trail making task to locate her room, pt requiring Max visual and tactile cues. Pt presenting with poor ST memory to recall the task and her room number as well as decreased attention, awareness, and problem solving.         Exercises     Shoulder Instructions       General Comments Sister and father present during session.     Pertinent Vitals/ Pain       Pain Assessment: No/denies pain Faces Pain Scale: Hurts a little bit  Home Living   Living Arrangements: Parent Available Help at Discharge: Family;Available 24 hours/day Type of Home: House                   Bathroom Accessibility: Yes How Accessible: Accessible via walker Home Equipment: [place] - quad;Other (comment);Walker - 2 wheels;Bedside commode;Shower seat;Wheelchair - manual   Additional Comments: Dad had cared for his wife with ALS for 3 years before she died  Lives With: Family    Prior Functioning/Environment              Frequency  Min 2X/week        Progress Toward Goals  OT Goals(current goals can now be found in the care plan section)  Progress towards OT goals: Progressing toward goals  Acute Rehab OT Goals Patient Stated Goal: Pt wants to discharge home  OT Goal Formulation: With patient/family Time For Goal Achievement: 02/15/18 Potential to Achieve Goals: Good ADL Goals Pt Will Perform Grooming: with supervision;standing Pt Will Perform  Lower Body Bathing: with supervision;sit to/from stand Pt Will Perform Lower Body Dressing: with supervision;sit to/from stand Pt Will Transfer to Toilet: with supervision;ambulating;regular height toilet;grab bars Pt Will Perform Toileting - Clothing Manipulation and hygiene: with supervision;sit to/from stand Additional ADL Goal #1: Pt will be able to selectively attend to activity x 3 mins with min cues  Plan Discharge plan remains appropriate    Co-evaluation    PT/OT/SLP Co-Evaluation/Treatment: Yes Reason for Co-Treatment: Complexity of the patient's impairments (multi-system involvement)(For safety and communication)   OT goals addressed during session: ADL's and self-care      AM-PAC OT "6 Clicks" Daily Activity     Outcome Measure   Help from another person eating meals?: A Little Help from another person taking care of personal grooming?: A Little Help from another person toileting, which includes using toliet, bedpan, or urinal?: A Little Help from another person bathing (including washing, rinsing, drying)?: A Little Help from another person to put on and taking off regular upper body clothing?: A Little Help from another person to put on and taking off regular lower body clothing?: A Little 6 Click Score: 18    End of Session Equipment Utilized During Treatment: Gait belt;Rolling walker  OT Visit Diagnosis: Unsteadiness on feet (R26.81);Cognitive communication deficit (R41.841) Symptoms and signs involving cognitive functions: Cerebral infarction   Activity Tolerance Patient tolerated  treatment well   Patient Left with call bell/phone within reach;with family/visitor present;in chair   Nurse Communication Mobility status        Time: 8979-1504 OT Time Calculation (min): 53 min  Charges: OT General Charges $OT Visit: 1 Visit OT Treatments $Self Care/Home Management : 23-37 mins  [place], OTR/L Acute Rehab Pager: [phone] Office:  [place] 02/03/2018, 2:45 PM

## 2018-02-03 NOTE — Progress Notes (Signed)
Emma Gong, RN  Rehab Admission Coordinator  Physical Medicine and Rehabilitation  PMR Pre-admission  Signed  Date of Service:  02/03/2018 2:22 PM       Related encounter: ED to Hosp-Admission (Current) from 01/30/2018 in [place] [facility]      Signed         Show:Clear all [x] Manual[x] Template[x] Copied  Added by: [x] Emma Gong, RN  [] Hover for details PMR Admission Coordinator Pre-Admission Assessment  Patient: Emma Martin is an 38 y.o., female MRN: [id number] DOB: 28-Oct-1980 Height: 5\' 1"  (154.9 cm) Weight: 50.8 kg                                                                                                                                                  Insurance Information  PRIMARY: uninsured       Patient had BCBS of [place] that lapsed 11/2017. Dad and sister working on paying the lapsed bill to see if they can reinstate it due to her medical issues which she lapsed her payment. Dad also working with Development worker, community, Shanon Rosser, to apply for disability and medicaid applications.  Medicaid Application Date:       Case Manager:  Disability Application Date:       Case Worker:   Emergency Contact Information         Contact Information    Name Relation Home Work Mobile   Naselle E Father [phone]     Raeana, [place]   [phone]     Current Medical History  Patient Admitting Diagnosis: multifocal infarcts with meningitis  History of Present Illness:Emma Martin is a 38 year old right handed female with history of hypertension, hypothyroidism, Turner syndrome, scoliosis status post Harrington rod placement, bicuspid aortic valve.  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 Presented 01/30/2018 with left-sided weakness. She is found on the floor by her father unresponsive. Cranial CT  scan reviewed, unremarkable for acute intracranial process. Patient did not receive TPA. CT angiogram of head and neck with no emergent large vessel occlusion. MRI /MRA showed scattered diffusion abnormality overlying the right cerebral convexity as well as the suprasellar cistern. Associated scattered multifocal acute early subacute ischemic infarcts. Patient is followed by infectious disease for recent bacteremia maintained on antibiotic therapy which had initially been scheduled to be completed 01/29/2018 and dates now changed to 02/26/2018. Repeat TEE showed no endocarditis or PFO Tolerating a regular diet.   Complete NIHSS TOTAL: 0  Past Medical History      Past Medical History:  Diagnosis Date  . Bicuspid aortic valve    , mild aortic insufficiency. (No SBE prophylaxis needed)  . Complication of anesthesia   . Dyslipidemia   . Generalized anxiety disorder    , with obsessive-compulsive traits.  . Hypertension   .  Hypothyroidism   . Mixed gonadal dysgenesis    , (additional Y-bearing cell line) for which she reportedly underwent bilateral gonadectomy (patient unaware; needs confirmation) due to malignancy potential.  . PONV (postoperative nausea and vomiting)   . Primary amenorrhea    , treated starting at age 89 or 50 with estrogen and progesterone  . Scoliosis    , treated with Harrington rods (1997) for stabilization.  . Seasonal allergic rhinitis   . Short stature    , previously treated with growth hormone age 76-15 years.  Radford Pax syndrome    , previously followed by pediatric endocrinologist Freddy Jaksch, MD at [facility] through her early 36s).    Family History  family history includes Cancer in her maternal grandmother and paternal grandfather; Hypertension in her mother; Hypothyroidism in her mother; Mitral valve prolapse in her mother; Other in her father and mother.  Prior Rehab/Hospitalizations:  Has the patient had major surgery  during 100 days prior to admission? No  Current Medications   Current Facility-Administered Medications:  .   stroke: mapping our early stages of recovery book, , Does not apply, Once, Truett Mainland, DO .  acetaminophen (TYLENOL) tablet 650 mg, 650 mg, Oral, Q4H PRN **OR** acetaminophen (TYLENOL) solution 650 mg, 650 mg, Per Tube, Q4H PRN **OR** acetaminophen (TYLENOL) suppository 650 mg, 650 mg, Rectal, Q4H PRN, Truett Mainland, DO .  atorvastatin (LIPITOR) tablet 20 mg, 20 mg, Oral, q1800, Rosalin Hawking, MD, 20 mg at 02/02/18 1653 .  cefTRIAXone (ROCEPHIN) 2 g in sodium chloride 0.9 % 100 mL IVPB, 2 g, Intravenous, Q12H, Rolla Flatten, [facility], Last Rate: 200 mL/hr at 02/03/18 0901, 2 g at 02/03/18 0901 .  FLUoxetine (PROZAC) capsule 40 mg, 40 mg, Oral, Daily, Truett Mainland, DO, 40 mg at 02/03/18 0859 .  fluticasone (FLONASE) 50 MCG/ACT nasal spray 2 spray, 2 spray, Each Nare, Daily, Truett Mainland, DO, 2 spray at 02/03/18 0905 .  levothyroxine (SYNTHROID, LEVOTHROID) tablet 75 mcg, 75 mcg, Oral, QAC breakfast, Truett Mainland, DO, 75 mcg at 02/03/18 0700 .  senna-docusate (Senokot-S) tablet 1 tablet, 1 tablet, Oral, QHS PRN, Truett Mainland, DO .  vancomycin (VANCOCIN) 1,250 mg in sodium chloride 0.9 % 250 mL IVPB, 1,250 mg, Intravenous, Q8H, Hammons, Kimberly B, RPH, Last Rate: 166.7 mL/hr at 02/03/18 0553, 1,250 mg at 02/03/18 0553  Patients Current Diet:     Diet Order                  Diet - low sodium heart healthy         Diet Heart Room service appropriate? Yes; Fluid consistency: Thin  Diet effective now               Precautions / Restrictions Precautions Precautions: Fall Restrictions Weight Bearing Restrictions: No   Has the patient had 2 or more falls or a fall with injury in the past year?No  Prior Activity Level Limited Community (1-2x/wk): Limited since dx of meningitis  Development worker, international aid / Equipment Home Assistive  Devices/Equipment: Eyeglasses Home Equipment: Cane - quad, Other (comment), Walker - 2 wheels, Bedside commode, Shower seat, Wheelchair - manual  Prior Device Use: Indicate devices/aids used by the patient prior to current illness, exacerbation or injury? None of the above  Prior Functional Level Prior Function Level of Independence: Independent Comments: Pt was fully independent.  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  Independent  Indoor Mobility: Did the patient need assistance with walking from room to room (with or without device)? Independent  Stairs: Did the patient need assistance with internal or external stairs (with or without device)? Independent  Functional Cognition: Did the patient need help planning regular tasks such as shopping or remembering to take medications? Independent  Current Functional Level Cognition  Arousal/Alertness: Awake/alert Overall Cognitive Status: Impaired/Different from baseline Current Attention Level: Sustained Orientation Level: Oriented X4 Following Commands: Follows one step commands consistently, Follows multi-step commands inconsistently(with cues) Safety/Judgement: Decreased awareness of safety, Decreased awareness of deficits General Comments: Pt requires max cues to follow 2 step commands.  She is able to sustain attention to complete simple, familiar ADL tasks, but is easily distracted when performing novel tasks and requires max cues to attend.   Pt internally distracted and will dead stop when ambulating and unable to divide attention to correct situation and continue to walk, or to move into a safe position (i.e. diaper slid down while walking, and pt stopped in hallway, lifted gown and attempted to manipulate diaper).   Attention: Sustained Sustained Attention: Impaired Sustained Attention Impairment: Functional  complex Memory: Impaired Memory Impairment: Decreased short term memory, Decreased recall of new information Decreased Short Term Memory: Functional complex Awareness: Impaired Awareness Impairment: Emergent impairment, Anticipatory impairment Problem Solving: Impaired Problem Solving Impairment: Functional complex    Extremity Assessment (includes Sensation/Coordination)  Upper Extremity Assessment: LUE deficits/detail LUE Deficits / Details: mild incordination Lt UE  LUE Coordination: decreased fine motor, decreased gross motor  Lower Extremity Assessment: Defer to PT evaluation    ADLs  Overall ADL's : Needs assistance/impaired Eating/Feeding: Set up, Supervision/ safety, Sitting Grooming: Wash/dry hands, Wash/dry face, Oral care, Brushing hair, Min guard, Standing Upper Body Bathing: Minimal assistance, Sitting Lower Body Bathing: Minimal assistance, Sit to/from stand Upper Body Dressing : Minimal assistance, Sitting Lower Body Dressing: Minimal assistance, Sit to/from stand Toilet Transfer: Minimal assistance, Ambulation, Comfort height toilet Toileting- Clothing Manipulation and Hygiene: Minimal assistance, Sit to/from stand Toileting - Clothing Manipulation Details (indicate cue type and reason): Pt was incontinent of stool Functional mobility during ADLs: Minimal assistance    Mobility  Overal bed mobility: Needs Assistance Bed Mobility: Rolling, Sidelying to Sit, Sit to Supine Rolling: Supervision Sidelying to sit: Supervision Sit to supine: Min assist General bed mobility comments: Supervision for rolling, min guard for sidelying to sit. Pt with very increased effort to come to sitting, PT encouraged pt to use L forearm and R hand to push up to sitting. Min assist for lifting legs back into bed with sit to supine, and PT and pt's father assisted pt in scooting up in bed with use of bed pad.     Transfers  Overall transfer level: Needs assistance Equipment used:  Rolling walker (2 wheeled), Quad cane Transfers: Sit to/from Stand Sit to Stand: Min guard, Min assist General transfer comment: standing with min A for stability. reaching for support     Ambulation / Gait / Stairs / Wheelchair Mobility  Ambulation/Gait Ambulation/Gait assistance: Min assist Gait Distance (Feet): (20' 20' 20' 20' 20' with intervention ) Assistive device: 1 person hand held assist Gait Pattern/deviations: Staggering left, Step-to pattern, Drifts right/left General Gait Details: Pt with cont L innatention, better obsticle avoidance today, difficulty dual tasking or problem solving during gait. able to follow some cues to find room but unabel to do so indepenently. unsteady without RW, min  A for stability. no overt LOB today.   Gait velocity: decreased    Posture / Balance Dynamic Sitting Balance Sitting balance - Comments: able to sit EOB with min gaurd assist  Balance Overall balance assessment: Needs assistance Sitting-balance support: No upper extremity supported, Feet unsupported Sitting balance-Leahy Scale: Fair Sitting balance - Comments: able to sit EOB with min gaurd assist  Standing balance support: Single extremity supported Standing balance-Leahy Scale: Poor Standing balance comment: requires min A to maintain balance     Special needs/care consideration BiPAP/CPAP n/a CPM n/a Continuous Drip IV n/a Dialysis n/a Life Vest n/a Oxygen n/a Special Bed n/a Trach Size n/a Wound Vac n/a Skin n/a Bowel mgmt: incontinent LBM 1/13 Bladder mgmt: external catheter Diabetic mgmt n/a PICC to be placed for home aniacinotics until 02/26/2018 Bilateral hearing loss since 12/2017 after treatment for meningitis Scheduled to go [facility] for cochlear implant evaluations   Previous Home Environment Living Arrangements: Parent  Lives With: Family Available Help at Discharge: Family, Available 24 hours/day Type of Home: House Home Layout: Two level, Bed/bath  upstairs Alternate Level Stairs-Rails: Right, Left Alternate Level Stairs-Number of Steps: flight, with stair lift  Home Access: Stairs to enter Entrance Stairs-Rails: Right, Left Entrance Stairs-Number of Steps: 2 Bathroom Shower/Tub: Multimedia programmer: Handicapped height Bathroom Accessibility: Yes How Accessible: Accessible via walker [place]: No Additional Comments: Dad had cared for his wife with ALS for 3 years before she died  Discharge Living Setting Plans for Discharge Living Setting: Lives with (comment)(Father) Type of Home at Discharge: House Discharge Home Layout: Two level Alternate Level Stairs-Rails: Right, Left(with stair lift) Discharge Home Access: Stairs to enter Entrance Stairs-Rails: Right, Left Entrance Stairs-Number of Steps: 2 Discharge Bathroom Shower/Tub: Walk-in shower Discharge Bathroom Toilet: Handicapped height Discharge Bathroom Accessibility: Yes How Accessible: Accessible via walker Does the patient have any problems obtaining your medications?: Yes (Describe)(insurance lapsed 11/2017)  Social/Family/Support Systems Contact Information: Dad, Media planner Anticipated Caregiver: Dad; sister lives in [place]: [phone] Ability/Limitations of Caregiver: no limitations Caregiver Availability: 24/7 Discharge Plan Discussed with Primary Caregiver: Yes Is Caregiver In Agreement with Plan?: Yes Does Caregiver/Family have Issues with Lodging/Transportation while Pt is in Rehab?: No   Goals/Additional Needs Patient/Family Goal for Rehab: Mod I to supervision with PT, OT, and SLP Expected length of stay: ELOS 4 to 7 days Special Service Needs: Patietn with complete hearing loss since receiving anitbiotics for meningitis 12/2017 Additional Information: Patient scheduled to go to [facility] to be seen about cochlear implants Pt/Family Agrees to Admission and willing to participate:  Yes Program Orientation Provided & Reviewed with Pt/Caregiver Including Roles  & Responsibilities: Yes   Decrease burden of Care through IP rehab admission: n/a  Possible need for SNF placement upon discharge: not anticipated  Patient Condition: This patient's medical and functional status has changed since the consult dated 02/01/2018 in which the Rehabilitation Physician determined and documented that the patient was potentially appropriate for intensive rehabilitative care in an inpatient rehabilitation facility. Issues have been addressed and update has been discussed with Dr. Posey Pronto and patient now appropriate for inpatient rehabilitation. Will admit to inpatient rehab today.   Preadmission Screen Completed By:  Cleatrice Burke, 02/03/2018 2:22 PM ______________________________________________________________________   Discussed status with Dr. Posey Pronto on 02/03/2018 at  1428  and received telephone approval for admission today.  Admission Coordinator:  Cleatrice Burke, time 3329 Date 02/03/2018           Cosigned  by: Jamse Arn, MD at 02/03/2018 3:00 PM  Revision History

## 2018-02-03 NOTE — PMR Pre-admission (Signed)
PMR Admission Coordinator Pre-Admission Assessment  Patient: Emma Martin is an 38 y.o., female MRN: [id number] DOB: 1980-06-10 Height: 5\' 1"  (154.9 cm) Weight: 50.8 kg              Insurance Information  PRIMARY: uninsured       Patient had BCBS of [place] that lapsed 11/2017. Dad and sister working on paying the lapsed bill to see if they can reinstate it due to her medical issues which she lapsed her payment. Dad also working with Development worker, community, Shanon Rosser, to apply for disability and medicaid applications.  Medicaid Application Date:       Case Manager:  Disability Application Date:       Case Worker:   Emergency Contact Information Contact Information    Name Relation Home Work Mobile   [place] E Father [phone]     Tiyona, Desouza   [phone]     Current Medical History  Patient Admitting Diagnosis: multifocal infarcts with meningitis  History of Present Illness: Emma Martin is a 38 year old right handed female with history of hypertension, hypothyroidism, Turner syndrome, scoliosis status post Harrington rod placement, bicuspid aortic valve.  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 Presented 01/30/2018 with left-sided weakness. She is found on the floor by her father unresponsive. Cranial CT scan reviewed, unremarkable for acute intracranial process. Patient did not receive TPA. CT angiogram of head and neck with no emergent large vessel occlusion. MRI /MRA showed scattered diffusion abnormality overlying the right cerebral convexity as well as the suprasellar cistern. Associated scattered multifocal acute early subacute ischemic infarcts. Patient is followed by infectious disease for recent bacteremia maintained on antibiotic therapy which had initially been scheduled to be completed 01/29/2018 and dates now changed to 02/26/2018. Repeat TEE  showed no endocarditis or PFO Tolerating a regular diet.   Complete NIHSS TOTAL: 0    Past Medical History  Past Medical History:  Diagnosis Date  . Bicuspid aortic valve    , mild aortic insufficiency. (No SBE prophylaxis needed)  . Complication of anesthesia   . Dyslipidemia   . Generalized anxiety disorder    , with obsessive-compulsive traits.  . Hypertension   . Hypothyroidism   . Mixed gonadal dysgenesis    , (additional Y-bearing cell line) for which she reportedly underwent bilateral gonadectomy (patient unaware; needs confirmation) due to malignancy potential.  . PONV (postoperative nausea and vomiting)   . Primary amenorrhea    , treated starting at age 12 or 49 with estrogen and progesterone  . Scoliosis    , treated with Harrington rods (1997) for stabilization.  . Seasonal allergic rhinitis   . Short stature    , previously treated with growth hormone age 58-15 years.  Radford Pax syndrome    , previously followed by pediatric endocrinologist Freddy Jaksch, MD at [facility] through her early 49s).    Family History  family history includes Cancer in her maternal grandmother and paternal grandfather; Hypertension in her mother; Hypothyroidism in her mother; Mitral valve prolapse in her mother; Other in her father and mother.  Prior Rehab/Hospitalizations:  Has the patient had major surgery during 100 days prior to admission? No  Current Medications   Current Facility-Administered Medications:  .   stroke: mapping our early stages of recovery book, , Does not apply, Once, Truett Mainland, DO .  acetaminophen (TYLENOL) tablet 650 mg, 650 mg, Oral, Q4H PRN **OR** acetaminophen (TYLENOL) solution  650 mg, 650 mg, Per Tube, Q4H PRN **OR** acetaminophen (TYLENOL) suppository 650 mg, 650 mg, Rectal, Q4H PRN, Truett Mainland, DO .  atorvastatin (LIPITOR) tablet 20 mg, 20 mg, Oral, q1800, Rosalin Hawking, MD, 20 mg at 02/02/18 1653 .  cefTRIAXone (ROCEPHIN) 2 g in sodium  chloride 0.9 % 100 mL IVPB, 2 g, Intravenous, Q12H, Rolla Flatten, [facility], Last Rate: 200 mL/hr at 02/03/18 0901, 2 g at 02/03/18 0901 .  FLUoxetine (PROZAC) capsule 40 mg, 40 mg, Oral, Daily, Truett Mainland, DO, 40 mg at 02/03/18 0859 .  fluticasone (FLONASE) 50 MCG/ACT nasal spray 2 spray, 2 spray, Each Nare, Daily, Truett Mainland, DO, 2 spray at 02/03/18 0905 .  levothyroxine (SYNTHROID, LEVOTHROID) tablet 75 mcg, 75 mcg, Oral, QAC breakfast, Truett Mainland, DO, 75 mcg at 02/03/18 0700 .  senna-docusate (Senokot-S) tablet 1 tablet, 1 tablet, Oral, QHS PRN, Truett Mainland, DO .  vancomycin (VANCOCIN) 1,250 mg in sodium chloride 0.9 % 250 mL IVPB, 1,250 mg, Intravenous, Q8H, Hammons, Kimberly B, RPH, Last Rate: 166.7 mL/hr at 02/03/18 0553, 1,250 mg at 02/03/18 0553  Patients Current Diet:  Diet Order            Diet - low sodium heart healthy        Diet Heart Room service appropriate? Yes; Fluid consistency: Thin  Diet effective now              Precautions / Restrictions Precautions Precautions: Fall Restrictions Weight Bearing Restrictions: No   Has the patient had 2 or more falls or a fall with injury in the past year?No  Prior Activity Level Limited Community (1-2x/wk): Limited since dx of meningitis  Development worker, international aid / Equipment Home Assistive Devices/Equipment: Eyeglasses Home Equipment: Cane - quad, Other (comment), Walker - 2 wheels, Bedside commode, Shower seat, Wheelchair - manual  Prior Device Use: Indicate devices/aids used by the patient prior to current illness, exacerbation or injury? None of the above  Prior Functional Level Prior Function Level of Independence: Independent Comments: Pt was fully independent.  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  Independent  Indoor Mobility: Did the patient need assistance with walking  from room to room (with or without device)? Independent  Stairs: Did the patient need assistance with internal or external stairs (with or without device)? Independent  Functional Cognition: Did the patient need help planning regular tasks such as shopping or remembering to take medications? Independent  Current Functional Level Cognition  Arousal/Alertness: Awake/alert Overall Cognitive Status: Impaired/Different from baseline Current Attention Level: Sustained Orientation Level: Oriented X4 Following Commands: Follows one step commands consistently, Follows multi-step commands inconsistently(with cues) Safety/Judgement: Decreased awareness of safety, Decreased awareness of deficits General Comments: Pt requires max cues to follow 2 step commands.  She is able to sustain attention to complete simple, familiar ADL tasks, but is easily distracted when performing novel tasks and requires max cues to attend.   Pt internally distracted and will dead stop when ambulating and unable to divide attention to correct situation and continue to walk, or to move into a safe position (i.e. diaper slid down while walking, and pt stopped in hallway, lifted gown and attempted to manipulate diaper).   Attention: Sustained Sustained Attention: Impaired Sustained Attention Impairment: Functional complex Memory: Impaired Memory Impairment: Decreased short term memory, Decreased recall of new information Decreased Short Term Memory:  Functional complex Awareness: Impaired Awareness Impairment: Emergent impairment, Anticipatory impairment Problem Solving: Impaired Problem Solving Impairment: Functional complex    Extremity Assessment (includes Sensation/Coordination)  Upper Extremity Assessment: LUE deficits/detail LUE Deficits / Details: mild incordination Lt UE  LUE Coordination: decreased fine motor, decreased gross motor  Lower Extremity Assessment: Defer to PT evaluation    ADLs  Overall ADL's : Needs  assistance/impaired Eating/Feeding: Set up, Supervision/ safety, Sitting Grooming: Wash/dry hands, Wash/dry face, Oral care, Brushing hair, Min guard, Standing Upper Body Bathing: Minimal assistance, Sitting Lower Body Bathing: Minimal assistance, Sit to/from stand Upper Body Dressing : Minimal assistance, Sitting Lower Body Dressing: Minimal assistance, Sit to/from stand Toilet Transfer: Minimal assistance, Ambulation, Comfort height toilet Toileting- Clothing Manipulation and Hygiene: Minimal assistance, Sit to/from stand Toileting - Clothing Manipulation Details (indicate cue type and reason): Pt was incontinent of stool Functional mobility during ADLs: Minimal assistance    Mobility  Overal bed mobility: Needs Assistance Bed Mobility: Rolling, Sidelying to Sit, Sit to Supine Rolling: Supervision Sidelying to sit: Supervision Sit to supine: Min assist General bed mobility comments: Supervision for rolling, min guard for sidelying to sit. Pt with very increased effort to come to sitting, PT encouraged pt to use L forearm and R hand to push up to sitting. Min assist for lifting legs back into bed with sit to supine, and PT and pt's father assisted pt in scooting up in bed with use of bed pad.     Transfers  Overall transfer level: Needs assistance Equipment used: Rolling walker (2 wheeled), Quad cane Transfers: Sit to/from Stand Sit to Stand: Min guard, Min assist General transfer comment: standing with min A for stability. reaching for support     Ambulation / Gait / Stairs / Wheelchair Mobility  Ambulation/Gait Ambulation/Gait assistance: Min assist Gait Distance (Feet): (20' 20' 20' 20' 20' with intervention ) Assistive device: 1 person hand held assist Gait Pattern/deviations: Staggering left, Step-to pattern, Drifts right/left General Gait Details: Pt with cont L innatention, better obsticle avoidance today, difficulty dual tasking or problem solving during gait. able to follow  some cues to find room but unabel to do so indepenently. unsteady without RW, min A for stability. no overt LOB today.   Gait velocity: decreased    Posture / Balance Dynamic Sitting Balance Sitting balance - Comments: able to sit EOB with min gaurd assist  Balance Overall balance assessment: Needs assistance Sitting-balance support: No upper extremity supported, Feet unsupported Sitting balance-Leahy Scale: Fair Sitting balance - Comments: able to sit EOB with min gaurd assist  Standing balance support: Single extremity supported Standing balance-Leahy Scale: Poor Standing balance comment: requires min A to maintain balance     Special needs/care consideration BiPAP/CPAP n/a CPM n/a Continuous Drip IV n/a Dialysis n/a Life Vest n/a Oxygen n/a Special Bed n/a Trach Size n/a Wound Vac n/a Skin n/a Bowel mgmt: incontinent LBM 1/13 Bladder mgmt: external catheter Diabetic mgmt n/a PICC to be placed for home aniacinotics until 02/26/2018 Bilateral hearing loss since 12/2017 after treatment for meningitis Scheduled to go to [facility] for cochlear implant evaluations   Previous Home Environment Living Arrangements: Parent  Lives With: Family Available Help at Discharge: Family, Available 24 hours/day Type of Home: House Home Layout: Two level, Bed/bath upstairs Alternate Level Stairs-Rails: Right, Left Alternate Level Stairs-Number of Steps: flight, with stair lift  Home Access: Stairs to enter Entrance Stairs-Rails: Right, Left Entrance Stairs-Number of Steps: 2 Bathroom Shower/Tub: Multimedia programmer: Handicapped height Bathroom Accessibility: Yes  How Accessible: Accessible via walker Home Care Services: No Additional Comments: Dad had cared for his wife with ALS for 3 years before she died  Discharge Living Setting Plans for Discharge Living Setting: Lives with (comment)(Father) Type of Home at Discharge: House Discharge Home Layout: Two level Alternate Level  Stairs-Rails: Right, Left(with stair lift) Discharge Home Access: Stairs to enter Entrance Stairs-Rails: Right, Left Entrance Stairs-Number of Steps: 2 Discharge Bathroom Shower/Tub: Walk-in shower Discharge Bathroom Toilet: Handicapped height Discharge Bathroom Accessibility: Yes How Accessible: Accessible via walker Does the patient have any problems obtaining your medications?: Yes (Describe)(insurance lapsed 11/2017)  Social/Family/Support Systems Contact Information: Dad, Media planner Anticipated Caregiver: Dad; sister lives in [place]: [phone] Ability/Limitations of Caregiver: no limitations Caregiver Availability: 24/7 Discharge Plan Discussed with Primary Caregiver: Yes Is Caregiver In Agreement with Plan?: Yes Does Caregiver/Family have Issues with Lodging/Transportation while Pt is in Rehab?: No   Goals/Additional Needs Patient/Family Goal for Rehab: Mod I to supervision with PT, OT, and SLP Expected length of stay: ELOS 4 to 7 days Special Service Needs: Patietn with complete hearing loss since receiving anitbiotics for meningitis 12/2017 Additional Information: Patient scheduled to go to [facility] to be seen about cochlear implants Pt/Family Agrees to Admission and willing to participate: Yes Program Orientation Provided & Reviewed with Pt/Caregiver Including Roles  & Responsibilities: Yes   Decrease burden of Care through IP rehab admission: n/a  Possible need for SNF placement upon discharge: not anticipated  Patient Condition: This patient's medical and functional status has changed since the consult dated 02/01/2018 in which the Rehabilitation Physician determined and documented that the patient was potentially appropriate for intensive rehabilitative care in an inpatient rehabilitation facility. Issues have been addressed and update has been discussed with Dr. Posey Pronto and patient now appropriate for inpatient rehabilitation. Will  admit to inpatient rehab today.   Preadmission Screen Completed By:  Cleatrice Burke, 02/03/2018 2:22 PM ______________________________________________________________________   Discussed status with Dr. Posey Pronto on 02/03/2018 at  1428  and received telephone approval for admission today.  Admission Coordinator:  Cleatrice Burke, time 0350 Date 02/03/2018

## 2018-02-03 NOTE — H&P (Signed)
Physical Medicine and Rehabilitation Admission H&P    Chief Complaint  Patient presents with  . Code Stroke  : HPI: Emma Martin is a 38 year old right handed female with history of hypertension, hypothyroidism, Turner syndrome, scoliosis status post Harrington rod placement, bicuspid aortic valve. Per chart review and father, patient lives with her father. Independent prior to admission using a walker. 2 level home bedroom bath upstairs and 2 steps to entry. 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 Presented 01/30/2018 with left-sided weakness. She is found on the floor by her father unresponsive. Cranial CT scan reviewed, unremarkable for acute intracranial process. Patient did not receive TPA. CT angiogram of head and neck with no emergent large vessel occlusion. MRI /MRA showed scattered diffusion abnormality overlying the right cerebral convexity as well as the suprasellar cistern. Associated scattered multifocal acute early subacute ischemic infarcts. Patient is followed by infectious disease for recent bacteremia maintained on antibiotic therapy which had initially been scheduled to be completed 01/29/2018 and dates now changed to 02/26/2018. Repeat TEE showed no endocarditis or PFO. Tolerating a regular diet. Therapy evaluations completed with recommendations of physical medicine rehabilitation consult. Patient was admitted for a comprehensive rehabilitation program.  Review of Systems  Unable to perform ROS: Acuity of condition   Past Medical History:  Diagnosis Date  . Bicuspid aortic valve    , mild aortic insufficiency. (No SBE prophylaxis needed)  . Complication of anesthesia   . Dyslipidemia   . Generalized anxiety disorder    , with obsessive-compulsive traits.  . Hypertension   . Hypothyroidism   . Mixed gonadal dysgenesis    , (additional Y-bearing cell line) for which she  reportedly underwent bilateral gonadectomy (patient unaware; needs confirmation) due to malignancy potential.  . PONV (postoperative nausea and vomiting)   . Primary amenorrhea    , treated starting at age 24 or 30 with estrogen and progesterone  . Scoliosis    , treated with Harrington rods (1997) for stabilization.  . Seasonal allergic rhinitis   . Short stature    , previously treated with growth hormone age 25-15 years.  Radford Pax syndrome    , previously followed by pediatric endocrinologist Freddy Jaksch, MD at [facility] through her early 41s).   Past Surgical History:  Procedure Laterality Date  . TEE WITHOUT CARDIOVERSION N/A 01/19/2018   Procedure: TRANSESOPHAGEAL ECHOCARDIOGRAM (TEE);  Surgeon: Jerline Pain, MD;  Location: [facility] ENDOSCOPY;  Service: Cardiovascular;  Laterality: N/A;   Family History  Problem Relation Age of Onset  . Other Father        A + W  . Cancer Maternal Grandmother   . Cancer Paternal Grandfather   . Hypertension Mother   . Other Mother        Dyslipidemia  . Hypothyroidism Mother   . Mitral valve prolapse Mother    Social History:  reports that she has never smoked. She has never used smokeless tobacco. She reports current alcohol use of about 1.0 - 2.0 standard drinks of alcohol per week. She reports that she does not use drugs. Allergies:  Allergies  Allergen Reactions  . Benzoin Rash   Medications Prior to Admission  Medication Sig Dispense Refill  . FLUoxetine (PROZAC) 40 MG capsule Take 40 mg by mouth daily.    . fluticasone (FLONASE) 50 MCG/ACT nasal spray Place 2 sprays into both nostrils daily.    Marland Kitchen ibuprofen (ADVIL,MOTRIN) 200 MG tablet  Take 200-400 mg by mouth every 8 (eight) hours as needed for headache.    . levothyroxine (SYNTHROID, LEVOTHROID) 75 MCG tablet Take 75 mcg by mouth daily before breakfast.    . lisinopril (PRINIVIL,ZESTRIL) 10 MG tablet Take 1 tablet (10 mg total) by mouth daily. 30 tablet 0  . Multiple  Vitamins-Minerals (CENTRUM PO) Take 1 tablet by mouth daily.    . norethindrone-ethinyl estradiol-iron (MICROGESTIN FE,GILDESS FE,LOESTRIN FE) 1.5-30 MG-MCG tablet Take 1 tablet by mouth daily.    . [EXPIRED] cefTRIAXone (ROCEPHIN) IVPB Inject 2 g into the vein every 12 (twelve) hours for 10 days. Indication:  meningitis Last Day of Therapy:  01/29/2018 Labs - Once weekly:  CBC/D and BMP, Labs - Every other week:  ESR and CRP (Patient not taking: Reported on 01/30/2018) 20 Units 0  . [EXPIRED] vancomycin IVPB Inject 1,000 mg into the vein every 8 (eight) hours for 10 days. Indication:  meningitis Last Day of Therapy:  01/29/2018 Labs - Sunday/Monday:  CBC/D, BMP, and vancomycin trough. Labs - Thursday:  BMP and vancomycin trough Labs - Every other week:  ESR and CRP (Patient not taking: Reported on 01/30/2018) 30 Units 0    Drug Regimen Review Drug regimen was reviewed and remains appropriate with no significant issues identified  Home: Home Living Family/patient expects to be discharged to:: Private residence Living Arrangements: Parent Available Help at Discharge: Family, Available 24 hours/day Type of Home: House Home Access: Stairs to enter [employer] of Steps: 2 Entrance Stairs-Rails: Right, Left Home Layout: Two level, Bed/bath upstairs Alternate Level Stairs-Number of Steps: flight, with stair lift  Alternate Level Stairs-Rails: Right, Left Bathroom Shower/Tub: Multimedia programmer: Handicapped height Home Equipment: Cane - quad, Other (comment), Walker - 2 wheels, Bedside commode, Shower seat, Wheelchair - Equities trader )  Lives With: Family(her Dad)   Functional History: Prior Function Level of Independence: Independent Comments: Pt was fully independent.  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 Pt with very increased effort to come to sitting, PT encouraged pt to use L forearm and R hand to push up to sitting. Min assist for lifting legs back into bed with sit to supine, and PT and pt's father assisted pt in scooting up in bed with use of bed pad.  Transfers Overall transfer level: Needs assistance Equipment used: Rolling walker (2 wheeled), Quad cane Transfers: Sit to/from Stand Sit to Stand: Min guard, Min assist General transfer comment: standing with min A for stability. reaching for support  Ambulation/Gait Ambulation/Gait assistance: Min assist Gait Distance (Feet): (20' 20' 20' 20' 20' with intervention ) Assistive device: 1 person hand held assist Gait Pattern/deviations: Staggering left, Step-to pattern, Drifts right/left General Gait Details: Pt with cont L innatention, better obsticle avoidance today, difficulty dual tasking or problem solving during gait. able to follow some cues to find room but unabel to do so indepenently. unsteady without RW, min A for stability. no overt LOB today.   Gait velocity: decreased    ADL: ADL Overall ADL's : Needs assistance/impaired Eating/Feeding: Set up, Supervision/ safety, Sitting Grooming: Wash/dry hands, Wash/dry face, Oral care, Brushing hair, Min guard, Standing Upper Body Bathing: Minimal assistance, Sitting Lower Body Bathing: Minimal assistance,  Sit to/from stand Upper Body Dressing : Minimal assistance, Sitting Lower Body Dressing: Minimal assistance, Sit to/from stand Toilet Transfer: Minimal assistance, Ambulation, Comfort height toilet Toileting- Clothing Manipulation and Hygiene: Minimal assistance, Sit to/from stand Toileting - Clothing Manipulation Details (indicate cue type and reason): Pt was incontinent of stool Functional mobility  during ADLs: Minimal assistance  Cognition: Cognition Overall Cognitive Status: Impaired/Different from baseline Arousal/Alertness: Awake/alert Orientation Level: Oriented X4 Attention: Sustained Sustained Attention: Impaired Sustained Attention Impairment: Functional complex Memory: Impaired Memory Impairment: Decreased short term memory, Decreased recall of new information Decreased Short Term Memory: Functional complex Awareness: Impaired Awareness Impairment: Emergent impairment, Anticipatory impairment Problem Solving: Impaired Problem Solving Impairment: Functional complex Cognition Arousal/Alertness: Awake/alert Behavior During Therapy: Flat affect Overall Cognitive Status: Impaired/Different from baseline Area of Impairment: Attention, Memory, Following commands, Safety/judgement, Problem solving, Awareness Current Attention Level: Sustained Memory: Decreased short-term memory Following Commands: Follows one step commands consistently, Follows multi-step commands inconsistently(with cues) Safety/Judgement: Decreased awareness of safety, Decreased awareness of deficits Awareness: Emergent(at times anticipatory but inconsistent) Problem Solving: Difficulty sequencing, Requires verbal cues, Requires tactile cues General Comments: Pt requires max cues to follow 2 step commands.  She is able to sustain attention to complete simple, familiar ADL tasks, but is easily distracted when performing novel tasks and requires max cues to attend.   Pt internally distracted and will dead stop when ambulating and unable to divide attention to correct situation and continue to walk, or to move into a safe position (i.e. diaper slid down while walking, and pt stopped in hallway, lifted gown and attempted to manipulate diaper).    Physical Exam: Blood pressure 140/89, pulse 90, temperature 99.3 F (37.4 C), temperature source Oral, resp. rate 15, height '5\' 1"'  (1.549 m), weight 50.8 kg, SpO2 100  %. Physical Exam  Vitals reviewed. Constitutional: She appears well-developed.  Frail  HENT:  Head: Normocephalic and atraumatic.  Eyes: EOM are normal. Right eye exhibits no discharge. Left eye exhibits no discharge.  Neck: Normal range of motion. Neck supple. No thyromegaly present.  Cardiovascular: Normal rate and regular rhythm.  Respiratory: [place] normal and breath sounds normal. No respiratory distress.  GI: Soft. Bowel sounds are normal. She exhibits no distension.  Musculoskeletal:     Comments: No edema or tenderness in extremities  Neurological: She is alert.  Patient is deaf bilaterally but will provide some answers by reading lips.  Follow simple demonstrated commands inconsistently.  Motor: Grossly 4-/5 throughout  Skin: Skin is warm and dry.  Psychiatric:  Unable to assess due to hearing/?cognition    Results for orders placed or performed during the hospital encounter of 01/30/18 (from the past 48 hour(s))  Vancomycin, trough     Status: Abnormal   Collection Time: 02/01/18  7:06 PM  Result Value Ref Range   Vancomycin Tr 11 (L) 15 - 20 ug/mL    Comment: Performed at [facility], [address], [place], [place] [address]  Basic metabolic panel     Status: Abnormal   Collection Time: 02/02/18  4:41 AM  Result Value Ref Range   Sodium 133 (L) 135 - 145 mmol/L   Potassium 3.3 (L) 3.5 - 5.1 mmol/L   Chloride 96 (L) 98 - 111 mmol/L   CO2 27 22 - 32 mmol/L   Glucose, Bld 101 (H) 70 - 99 mg/dL   BUN 9 6 - 20 mg/dL   Creatinine, Ser 0.48 0.44 - 1.00 mg/dL   Calcium 8.5 (L) 8.9 - 10.3  mg/dL   GFR calc non Af Amer >60 >60 mL/min   GFR calc Af Amer >60 >60 mL/min   Anion gap 10 5 - 15    Comment: Performed at [facility] [address] [place], [place] [address]  CBC with Differential/Platelet     Status: None   Collection Time: 02/03/18  8:43 AM  Result Value Ref Range   WBC 7.5 4.0 - 10.5 K/uL   RBC 4.18 3.87 - 5.11 MIL/uL   Hemoglobin 12.2  12.0 - 15.0 g/dL   HCT 36.2 36.0 - 46.0 %   MCV 86.6 80.0 - 100.0 fL   MCH 29.2 26.0 - 34.0 pg   MCHC 33.7 30.0 - 36.0 g/dL   RDW 13.9 11.5 - 15.5 %   Platelets 258 150 - 400 K/uL   nRBC 0.0 0.0 - 0.2 %   Neutrophils Relative % 77 %   Neutro Abs 5.8 1.7 - 7.7 K/uL   Lymphocytes Relative 9 %   Lymphs Abs 0.7 0.7 - 4.0 K/uL   Monocytes Relative 12 %   Monocytes Absolute 0.9 0.1 - 1.0 K/uL   Eosinophils Relative 1 %   Eosinophils Absolute 0.1 0.0 - 0.5 K/uL   Basophils Relative 1 %   Basophils Absolute 0.1 0.0 - 0.1 K/uL   Immature Granulocytes 0 %   Abs Immature Granulocytes 0.03 0.00 - 0.07 K/uL    Comment: Performed at [facility], [address] [place], [place] [address]  Comprehensive metabolic panel     Status: Abnormal   Collection Time: 02/03/18  8:43 AM  Result Value Ref Range   Sodium 133 (L) 135 - 145 mmol/L   Potassium 3.6 3.5 - 5.1 mmol/L   Chloride 99 98 - 111 mmol/L   CO2 24 22 - 32 mmol/L   Glucose, Bld 98 70 - 99 mg/dL   BUN 6 6 - 20 mg/dL   Creatinine, Ser 0.40 (L) 0.44 - 1.00 mg/dL   Calcium 8.3 (L) 8.9 - 10.3 mg/dL   Total Protein 5.8 (L) 6.5 - 8.1 g/dL   Albumin 2.6 (L) 3.5 - 5.0 g/dL   AST 39 15 - 41 U/L   ALT 187 (H) 0 - 44 U/L   Alkaline Phosphatase 148 (H) 38 - 126 U/L   Total Bilirubin 0.4 0.3 - 1.2 mg/dL   GFR calc non Af Amer >60 >60 mL/min   GFR calc Af Amer >60 >60 mL/min   Anion gap 10 5 - 15    Comment: Performed at [facility], [place] [address] [place], [place] [address]  Magnesium     Status: None   Collection Time: 02/03/18  8:43 AM  Result Value Ref Range   Magnesium 1.7 1.7 - 2.4 mg/dL    Comment: Performed at [facility] [address], [place], [place] [address]  Phosphorus     Status: None   Collection Time: 02/03/18  8:43 AM  Result Value Ref Range   Phosphorus 2.6 2.5 - 4.6 mg/dL    Comment: Performed at [facility] [address], [place], [place] [address]   [place] Ekg Site [facility]  Result Date:  02/03/2018 If [facility] image not attached, placement could not be confirmed due to current cardiac rhythm.      Medical Problem List and Plan: 1.  Left side weakness secondary to multifocal infarct involving the right thalamic/IC, left SCA territory, right semi-ovale complicated by recent streptococcal  pneumonia bacteremia/meningitis 2 weeks prior with subsequent loss of hearing 2.  DVT Prophylaxis/Anticoagulation: SCDs. 3. Pain Management: Tylenol as needed 4. Mood:  Prozac 40 mg daily.Provide emotional support 5. Neuropsych: This patient is not capable of making decisions on her own behalf. 6. Skin/Wound Care:  Routine skin checks 7. Fluids/Electrolytes/Nutrition:  Routine in and out's with follow-up chemistries 8. ID. Continue vancomycin and ceftriaxone through 02/26/2018 per infectious disease. 9.Hypothyroidism. Synthroid 10. Hyperlipidemia. Lipitor  Post Admission Physician Evaluation: 1. Preadmission assessment reviewed and changes made below. 2. Functional deficits secondary  to multifocal infarcts. 3. Patient is admitted to receive collaborative, interdisciplinary care between the physiatrist, rehab nursing staff, and therapy team. 4. Patient's level of medical complexity and substantial therapy needs in context of that medical necessity cannot be provided at a lesser intensity of care such as a SNF. 5. Patient has experienced substantial functional loss from his/her baseline which was documented above under the "Functional History" and "Functional Status" headings.  Judging by the patient's diagnosis, physical exam, and functional history, the patient has potential for functional progress which will result in measurable gains while on inpatient rehab.  These gains will be of substantial and practical use upon discharge  in facilitating mobility and self-care at the household level. 6. Physiatrist will provide 24 hour management of medical needs as well as oversight of the therapy  plan/treatment and provide guidance as appropriate regarding the interaction of the two. 7. 24 hour rehab nursing will assist with safety, disease management, medication administration and patient education  and help integrate therapy concepts, techniques,education, etc. 8. PT will assess and treat for/with: Lower extremity strength, range of motion, stamina, balance, functional mobility, safety, adaptive techniques and equipment, coping skills, pain control, education. Goals are: Supervision. 9. OT will assess and treat for/with: ADL's, functional mobility, safety, upper extremity strength, adaptive techniques and equipment, ego support, and community reintegration.   Goals are: Supervision. Therapy may proceed with showering this patient. 10. SLP will assess and treat for/with: Language, cognition.  Goals are: Supervision/min a. 11. Case Management and Social Worker will assess and treat for psychological issues and discharge planning. 12. Team conference will be held weekly to assess progress toward goals and to determine barriers to discharge. 13. Patient will receive at least 3 hours of therapy per day at least 5 days per week. 14. ELOS: 7-11 days.       15. Prognosis:  good  I have personally performed a face to face diagnostic evaluation, including, but not limited to relevant history and physical exam findings, of this patient and developed relevant assessment and plan.  Additionally, I have reviewed and concur with the physician assistant's documentation above.  The patient's status has not changed. The original post admission physician evaluation remains appropriate, and any changes from the pre-admission screening or documentation from the acute chart are noted above.    Delice Lesch, MD, ABPMR Lavon Paganini Angiulli, PA-C 02/03/2018

## 2018-02-04 ENCOUNTER — Inpatient Hospital Stay (HOSPITAL_COMMUNITY): Payer: Self-pay | Admitting: Occupational Therapy

## 2018-02-04 ENCOUNTER — Inpatient Hospital Stay (HOSPITAL_COMMUNITY): Payer: Self-pay

## 2018-02-04 ENCOUNTER — Inpatient Hospital Stay (HOSPITAL_COMMUNITY): Payer: Self-pay | Admitting: Speech Pathology

## 2018-02-04 DIAGNOSIS — G001 Pneumococcal meningitis: Secondary | ICD-10-CM

## 2018-02-04 DIAGNOSIS — H903 Sensorineural hearing loss, bilateral: Secondary | ICD-10-CM

## 2018-02-04 DIAGNOSIS — R269 Unspecified abnormalities of gait and mobility: Secondary | ICD-10-CM

## 2018-02-04 DIAGNOSIS — I69398 Other sequelae of cerebral infarction: Secondary | ICD-10-CM

## 2018-02-04 LAB — CULTURE, BLOOD (ROUTINE X 2)
Culture: NO GROWTH
Culture: NO GROWTH
Special Requests: ADEQUATE
Special Requests: ADEQUATE

## 2018-02-04 LAB — CBC WITH DIFFERENTIAL/PLATELET
Abs Immature Granulocytes: 0.02 10*3/uL (ref 0.00–0.07)
Basophils Absolute: 0 10*3/uL (ref 0.0–0.1)
Basophils Relative: 1 %
EOS PCT: 1 %
Eosinophils Absolute: 0.1 10*3/uL (ref 0.0–0.5)
HCT: 37.6 % (ref 36.0–46.0)
Hemoglobin: 12.2 g/dL (ref 12.0–15.0)
Immature Granulocytes: 0 %
LYMPHS PCT: 11 %
Lymphs Abs: 0.7 10*3/uL (ref 0.7–4.0)
MCH: 27.7 pg (ref 26.0–34.0)
MCHC: 32.4 g/dL (ref 30.0–36.0)
MCV: 85.5 fL (ref 80.0–100.0)
Monocytes Absolute: 1 10*3/uL (ref 0.1–1.0)
Monocytes Relative: 16 %
Neutro Abs: 4.3 10*3/uL (ref 1.7–7.7)
Neutrophils Relative %: 71 %
Platelets: 259 10*3/uL (ref 150–400)
RBC: 4.4 MIL/uL (ref 3.87–5.11)
RDW: 13.8 % (ref 11.5–15.5)
WBC: 6 10*3/uL (ref 4.0–10.5)
nRBC: 0 % (ref 0.0–0.2)

## 2018-02-04 LAB — COMPREHENSIVE METABOLIC PANEL
ALK PHOS: 137 U/L — AB (ref 38–126)
ALT: 142 U/L — ABNORMAL HIGH (ref 0–44)
ANION GAP: 12 (ref 5–15)
AST: 34 U/L (ref 15–41)
Albumin: 2.7 g/dL — ABNORMAL LOW (ref 3.5–5.0)
BUN: 5 mg/dL — ABNORMAL LOW (ref 6–20)
CALCIUM: 8.7 mg/dL — AB (ref 8.9–10.3)
CO2: 28 mmol/L (ref 22–32)
Chloride: 94 mmol/L — ABNORMAL LOW (ref 98–111)
Creatinine, Ser: 0.46 mg/dL (ref 0.44–1.00)
GFR calc Af Amer: >60 mL/min (ref >60)
GFR calc non Af Amer: >60 mL/min (ref >60)
Glucose, Bld: 95 mg/dL (ref 70–99)
Potassium: 2.9 mmol/L — ABNORMAL LOW (ref 3.5–5.1)
Sodium: 134 mmol/L — ABNORMAL LOW (ref 135–145)
Total Bilirubin: 0.6 mg/dL (ref 0.3–1.2)
Total Protein: 5.9 g/dL — ABNORMAL LOW (ref 6.5–8.1)

## 2018-02-04 LAB — VANCOMYCIN, TROUGH: Vancomycin Tr: 8 ug/mL — ABNORMAL LOW (ref 15–20)

## 2018-02-04 MED ORDER — SODIUM CHLORIDE 0.9% FLUSH
10.0000 mL | Freq: Two times a day (BID) | INTRAVENOUS | Status: DC
  Administered 2018-02-04 – 2018-02-06 (×3): 10 mL
  Administered 2018-02-06: 40 mL
  Administered 2018-02-07: 20 mL
  Administered 2018-02-07 – 2018-02-09 (×3): 10 mL
  Administered 2018-02-10: 40 mL
  Administered 2018-02-12: 10 mL

## 2018-02-04 MED ORDER — VANCOMYCIN HCL IN DEXTROSE 1-5 GM/200ML-% IV SOLN
1000.0000 mg | Freq: Four times a day (QID) | INTRAVENOUS | Status: AC
  Administered 2018-02-04 – 2018-02-05 (×5): 1000 mg via INTRAVENOUS
  Filled 2018-02-04 (×6): qty 200

## 2018-02-04 MED ORDER — VANCOMYCIN HCL IN DEXTROSE 750-5 MG/150ML-% IV SOLN
750.0000 mg | Freq: Four times a day (QID) | INTRAVENOUS | Status: DC
  Filled 2018-02-04 (×2): qty 150

## 2018-02-04 MED ORDER — SODIUM CHLORIDE 0.9% FLUSH
10.0000 mL | INTRAVENOUS | Status: DC | PRN
  Administered 2018-02-05 – 2018-02-07 (×3): 10 mL
  Filled 2018-02-04 (×3): qty 40

## 2018-02-04 MED ORDER — POTASSIUM CHLORIDE CRYS ER 20 MEQ PO TBCR
20.0000 meq | EXTENDED_RELEASE_TABLET | Freq: Two times a day (BID) | ORAL | Status: DC
  Administered 2018-02-04 – 2018-02-08 (×9): 20 meq via ORAL
  Filled 2018-02-04 (×9): qty 1

## 2018-02-04 NOTE — Progress Notes (Signed)
Pharmacy Antibiotic Note  Emma Martin is a 38 y.o. female admitted on 02/03/2018 with left sided weakness and found to have multiple acute strokes concern for embolic phenomenon possibly related to an infectious source. Pt completed a course of Rocephin + Vancomycin on 01/29/2018 for strep pna bacteremia and meningitis.  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   Vancomycin trough is 8, remains subtherapeutic after Vancomycin was changed to 750 mg IV q6hrs yesterday. Pt has received 3 doses for this regimen with no missing doses. Trough was drawn approximately 5.5 hours after last dose, which is appropriate. Pt's scr has been stable at 0.46 today  Plan: Increase Vancomycin to 1000 mg IV Q 6 hours  Continue Rocephin 2gm IV q12h. Continue both antibiotics through 02/26/18 Recommend collecting repeat VT after 4 - 5 doses.     Temp (24hrs), Avg:99.3 F (37.4 C), Min:99 F (37.2 C), Max:99.7 F (37.6 C)  Recent Labs  Lab 01/30/18 0927 01/30/18 0934 01/30/18 1719  02/02/18 0441 02/03/18 0843 02/03/18 1435 02/04/18 0732 02/04/18 1547  WBC 10.4  --   --   --   --  7.5  --  6.0  --   CREATININE 0.43* 0.20*  --   --  0.48 0.40*  --  0.46  --   VANCOTROUGH  --   --   --    < >  --   --  8*  --  8*  VANCORANDOM  --   --  <4  --   --   --   --   --   --    < > = values in this interval not displayed.    Estimated Creatinine Clearance: 73.4 mL/min (by C-G formula based on SCr of 0.46 mg/dL).    Allergies  Allergen Reactions  . Benzoin Rash    Antimicrobials this admission: Vanc 1/11 >> Rocephin 1/11 >>  Dose adjustments this admission: 02/01/2018 VT 11 >> dose increased to 1250mg  IV q8h 02/03/2018 VT 8 >>>dose increased to 750 mg IV Q 6h 02/04/2018 VT 8 >>> dose increased to 1000 mg IV Q 6 hrs  Microbiology results: 1/11 BCx: ngtd 1/11 MRSA PCR: negative  Thank you for allowing pharmacy to be a part of this  patient's care.  Maryanna Shape, PharmD, BCPS, BCPPS Clinical Pharmacist  Pager: [phone]    02/04/2018 5:38 PM

## 2018-02-04 NOTE — Evaluation (Signed)
Physical Therapy Assessment and Plan  Patient Details  Name: Emma Martin MRN: [id number] Date of Birth: 1981-01-06  PT Diagnosis: Abnormality of gait and Cognitive deficits Rehab Potential: Good ELOS: 7-10   Today's Date: 02/04/2018 PT Individual Time:0900  - 1000      Problem List:  Patient Active Problem List   Diagnosis Date Noted  . Right thalamic infarction ([place]) 02/03/2018  . Bacteremia   . Meningitis   . Cerebrovascular accident (CVA) ([place])   . Benign essential HTN   . Tachypnea   . Sensorineural hearing loss (SNHL) of both ears 01/31/2018  . Dyslipidemia 01/31/2018  . Acute ischemic stroke ([place]) 01/30/2018  . Pneumococcal bacteremia   . Pneumococcal meningitis   . Turner syndrome   . Hypothyroidism 01/15/2018  . Generalized anxiety disorder 01/15/2018  . Bicuspid aortic valve 04/06/2013  . HTN (hypertension) 04/06/2013    Past Medical History:  Past Medical History:  Diagnosis Date  . Bicuspid aortic valve    , mild aortic insufficiency. (No SBE prophylaxis needed)  . Complication of anesthesia   . Dyslipidemia   . Generalized anxiety disorder    , with obsessive-compulsive traits.  . Hypertension   . Hypothyroidism   . Mixed gonadal dysgenesis    , (additional Y-bearing cell line) for which she reportedly underwent bilateral gonadectomy (patient unaware; needs confirmation) due to malignancy potential.  . PONV (postoperative nausea and vomiting)   . Primary amenorrhea    , treated starting at age 12 or 72 with estrogen and progesterone  . Scoliosis    , treated with Harrington rods (1997) for stabilization.  . Seasonal allergic rhinitis   . Short stature    , previously treated with growth hormone age 54-15 years.  Radford Pax syndrome    , previously followed by pediatric endocrinologist Freddy Jaksch, MD at [facility] through her early 45s).   Past Surgical History:  Past Surgical History:  Procedure Laterality Date  . TEE WITHOUT  CARDIOVERSION N/A 01/19/2018   Procedure: TRANSESOPHAGEAL ECHOCARDIOGRAM (TEE);  Surgeon: Jerline Pain, MD;  Location: [facility] ENDOSCOPY;  Service: Cardiovascular;  Laterality: N/A;  . TEE WITHOUT CARDIOVERSION N/A 02/02/2018   Procedure: TRANSESOPHAGEAL ECHOCARDIOGRAM (TEE);  Surgeon: Elouise Munroe, MD;  Location: [place];  Service: Cardiology;  Laterality: N/A;    Assessment & Plan Clinical Impression: Patient is a 38 y.o. right handed female with history of hypertension, hypothyroidism, Turner syndrome, scoliosis status post Harrington rod placement, bicuspid aortic valve. Per chart review and father, patient lives with her father. Independent prior to admission using a walker. 2 level home bedroom bath upstairs and 2 steps to entry. 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 Presented 01/30/2018 with left-sided weakness. She is found on the floor by her father unresponsive. Cranial CT scan reviewed, unremarkable for acute intracranial process. Patient did not receive TPA. CT angiogram of head and neck with no emergent large vessel occlusion. MRI /MRA showed scattered diffusion abnormality overlying the right cerebral convexity as well as the suprasellar cistern. Associated scattered multifocal acute early subacute ischemic infarcts. Patient is followed by infectious disease for recent bacteremia maintained on antibiotic therapy which had initially been scheduled to be completed 01/29/2018 and dates now changed to 02/26/2018. Repeat TEE showed no endocarditis or PFO. Tolerating a regular diet. Therapy evaluations completed with recommendations of physical medicine rehabilitation consult. Patient was admitted for a comprehensive rehabilitation program.  Patient transferred to CIR on 02/03/2018 .  Patient currently requires min with mobility secondary to decreased coordination, decreased attention to left and  decreased attention and decreased safety awareness.  Prior to hospitalization, patient was independent  with mobility and lived with Family in a House home.  Home access is 1Stairs to enter.  Patient will benefit from skilled PT intervention to maximize safe functional mobility and minimize fall risk for planned discharge home with 24 hour supervision.  Anticipate patient will benefit from follow up [place] at discharge.  PT - End of Session Activity Tolerance: Improving;Decreased this session;Tolerates 30+ min activity with multiple rests Endurance Deficit: Yes Endurance Deficit Description: required frequent rest breaks PT Assessment Rehab Potential (ACUTE/IP ONLY): Good PT Barriers to Discharge: Incontinence PT Patient demonstrates impairments in the following area(s): Balance;Motor;Safety;Perception;Endurance PT Transfers Functional Problem(s): Bed Mobility;Bed to Chair;Car;Furniture PT Locomotion Functional Problem(s): Ambulation;Stairs PT Plan PT Intensity: Minimum of 1-2 x/day ,45 to 90 minutes PT Frequency: 5 out of 7 days PT Duration Estimated Length of Stay: 7-10 PT Treatment/Interventions: Training and development officer;Ambulation/gait training;Cognitive remediation/compensation;Functional mobility training;DME/adaptive equipment instruction;Patient/family education;Stair training;Therapeutic Exercise;UE/LE Coordination activities;Wheelchair propulsion/positioning;Visual/perceptual remediation/compensation;UE/LE Strength taining/ROM;Therapeutic Activities PT Transfers Anticipated Outcome(s): supervision PT Locomotion Anticipated Outcome(s): supervision PT Recommendation Follow Up Recommendations: Home health PT Patient destination: Home Equipment Recommended: None recommended by PT  Skilled Therapeutic Intervention Patient seen for initial evaluation and progressing gait and mobility.  In supine resting in room and discussed with father patient's recent history and home set up.  Patient  aroused and communication via written notebook.  Supine to sit with S and scooted to EOB.  Sitting balance independent, stand pivot to wheelchair min A for safety.  Patient in w/c propelled with bilateral UE's with min A due to L UE limited attention and needing constant cues/assist to use L UE.  Patient in therapy gym ambulated with RW, then with wall rail or no device min A x 100'.  Negotiated stairs with railings and minguard to S assist x 12 steps.  Performed car transfer S/minguard with walker standing stepping in with L foot.  Ambulated 250' with RW and min A.  Left sitting in recliner with alarm belt, call bell in reach and father present.   PT Evaluation Precautions/Restrictions Precautions Precautions: Fall Precaution Comments: Deaf Pain Pain Assessment Pain Scale: 0-10 Pain Score: 0-No pain Home Living/Prior Functioning Home Living Living Arrangements: Parent Available Help at Discharge: Family;Available 24 hours/day Type of Home: House Home Access: Stairs to enter [employer] of Steps: 1 Entrance Stairs-Rails: Right;Left Home Layout: Two level;Bed/bath upstairs Alternate Level Stairs-Number of Steps: flight, with stair lift  Bathroom Shower/Tub: Multimedia programmer: Handicapped height Additional Comments: Dad had cared for his wife with ALS for 3 years before she died; therefore has stair lift, shower seat, 3 in 1  Lives With: Family Prior Function Level of Independence: Independent with transfers;Requires assistive device for independence;Independent with gait;Needs assistance with ADLs  Able to Take Stairs?: Yes(for home entry, was doing flight of stairs prior to Christmas and worsening of infection) Vocation: Unemployed Comments: Pt was fully independent.  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  Able to track to left visual field with  visual cues Perception Perception: Impaired Comments: ? Lt spatial inattention  Cognition Overall Cognitive Status: Impaired/Different from baseline Arousal/Alertness: Awake/alert Attention: Sustained Sustained Attention: Appears intact Memory: Impaired Memory Impairment: Decreased recall of new information Awareness: Impaired Problem Solving: Impaired Organizing: Impaired Safety/Judgment: Impaired Sensation  Sensation Light Touch: Appears Intact Coordination Gross Motor Movements are Fluid and Coordinated: No Fine Motor Movements are Fluid and Coordinated: No Motor  Motor Motor: Hemiplegia Motor - Skilled Clinical Observations: L side weakness/inattention  Mobility Bed Mobility Bed Mobility: Rolling Right;Supine to Sit Rolling Right: Supervision/verbal cueing Supine to Sit: Supervision/Verbal cueing Transfers Transfers: Sit to Stand;Stand Pivot Transfers Sit to Stand: Contact Guard/Touching assist Stand Pivot Transfers: Contact Guard/Touching assist Transfer (Assistive device): None Locomotion  Gait Ambulation: Yes Gait Assistance: Minimal Assistance - Patient > 75% Gait Distance (Feet): 250 Feet Assistive device: Rolling walker Gait Assistance Details: Visual cues for safe use of DME/AE;Visual cues/gestures for precautions/safety Gait Assistance Details: cues needed for safety as lifting her hand up from walker to adjust glasses or scratch nose, etc Stairs / Additional Locomotion Stairs: Yes Stairs Assistance: Minimal Assistance - Patient > 75% Stair Management Technique: Two rails;Alternating pattern;Forwards Number of Stairs: Midwife: Yes Wheelchair Assistance: Minimal assistance - Patient >75% Environmental health practitioner: Both upper extremities Wheelchair Parts Management: Needs assistance Distance: 100'  Trunk/Postural Assessment  Cervical Assessment Cervical Assessment: Exceptions to WFL(history of scolosis and Harrington  rods) Thoracic Assessment Thoracic Assessment: Exceptions to WFL(history of scolosis and Harrington rod) Lumbar Assessment Lumbar Assessment: Exceptions to WFL(history of scolosis and Harrington rod) Postural Control Postural Control: Within Functional Limits  Balance Balance Balance Assessed: Yes Static Sitting Balance Static Sitting - Balance Support: Feet supported Static Sitting - Level of Assistance: 7: Independent Dynamic Sitting Balance Dynamic Sitting - Balance Support: Feet supported Dynamic Sitting - Level of Assistance: 5: Stand by assistance Static Standing Balance Static Standing - Balance Support: No upper extremity supported Static Standing - Level of Assistance: 5: Stand by assistance Dynamic Standing Balance Dynamic Standing - Balance Support: Right upper extremity supported Dynamic Standing - Level of Assistance: 4: Min assist Extremity Assessment  RUE Assessment RUE Assessment: Within Functional Limits General Strength Comments: strength grossly 4/5 LUE Assessment LUE Assessment: Exceptions to [facility] Active Range of Motion (AROM) Comments: shoulder flexion to 150*, able to use WFL just slower than Rt General Strength Comments: strength grossly 4/5 RLE Assessment RLE Assessment: Within Functional Limits General Strength Comments: strength grossly 4+/5 LLE Assessment LLE Assessment: Within Functional Limits General Strength Comments: Strength grossly 4/5    Refer to Care Plan for Long Term Goals  Recommendations for other services: None   Discharge Criteria: Patient will be discharged from PT if patient refuses treatment 3 consecutive times without medical reason, if treatment goals not met, if there is a change in medical status, if patient makes no progress towards goals or if patient is discharged from hospital.  The above assessment, treatment plan, treatment alternatives and goals were discussed and mutually agreed upon: by patient and by  family  Reginia Naas 02/04/2018, 5:11 PM  Magda Kiel, [place] 02/04/2018

## 2018-02-04 NOTE — Care Management Note (Signed)
Inpatient [place] Individual Statement of Services  Patient Name:  Emma Martin  Date:  02/04/2018  Welcome to the [place].  Our goal is to provide you with an individualized program based on your diagnosis and situation, designed to meet your specific needs.  With this comprehensive rehabilitation program, you will be expected to participate in at least 3 hours of rehabilitation therapies Monday-Friday, with modified therapy programming on the weekends.  Your rehabilitation program will include the following services:  Physical Therapy (PT), Occupational Therapy (OT), Speech Therapy (ST), 24 hour per day rehabilitation nursing, Neuropsychology, Case Management (Social Worker), Rehabilitation Medicine, Nutrition Services and Pharmacy Services  Weekly team conferences will be held on Wednesday to discuss your progress.  Your Social Worker will talk with you frequently to get your input and to update you on team discussions.  Team conferences with you and your family in attendance may also be held.  Expected length of stay: 7-10 days  Overall anticipated outcome: supervision with cueing  Depending on your progress and recovery, your program may change. Your Social Worker will coordinate services and will keep you informed of any changes. Your Social Worker's name and contact numbers are listed  below.  The following services may also be recommended but are not provided by the [place]:    [place] will be made to provide these services after discharge if needed.  Arrangements include referral to agencies that provide these services.  Your insurance has been verified to be:  Working on Herbalist back and have applied for [employer] Your primary doctor is:  Maurice Small  Pertinent information will be shared with your doctor  and your insurance company.  Social Worker:  Ovidio Kin, [place] or ([phone]  Information discussed with and copy given to patient by: Elease Hashimoto, 02/04/2018, 1:17 PM

## 2018-02-04 NOTE — Evaluation (Signed)
Speech Language Pathology Assessment and Plan  Patient Details  Name: STACY SAILER MRN: [id number] Date of Birth: November 07, 1980  SLP Diagnosis: Cognitive Impairments  Rehab Potential: Good ELOS: 7-10 days     Today's Date: 02/04/2018 SLP Individual Time: 0810-0900 SLP Individual Time Calculation (min): 50 min   Problem List:  Patient Active Problem List   Diagnosis Date Noted  . Right thalamic infarction ([place]) 02/03/2018  . Bacteremia   . Meningitis   . Cerebrovascular accident (CVA) ([place])   . Benign essential HTN   . Tachypnea   . Sensorineural hearing loss (SNHL) of both ears 01/31/2018  . Dyslipidemia 01/31/2018  . Acute ischemic stroke ([place]) 01/30/2018  . Pneumococcal bacteremia   . Pneumococcal meningitis   . Turner syndrome   . Hypothyroidism 01/15/2018  . Generalized anxiety disorder 01/15/2018  . Bicuspid aortic valve 04/06/2013  . HTN (hypertension) 04/06/2013   Past Medical History:  Past Medical History:  Diagnosis Date  . Bicuspid aortic valve    , mild aortic insufficiency. (No SBE prophylaxis needed)  . Complication of anesthesia   . Dyslipidemia   . Generalized anxiety disorder    , with obsessive-compulsive traits.  . Hypertension   . Hypothyroidism   . Mixed gonadal dysgenesis    , (additional Y-bearing cell line) for which she reportedly underwent bilateral gonadectomy (patient unaware; needs confirmation) due to malignancy potential.  . PONV (postoperative nausea and vomiting)   . Primary amenorrhea    , treated starting at age 48 or 55 with estrogen and progesterone  . Scoliosis    , treated with Harrington rods (1997) for stabilization.  . Seasonal allergic rhinitis   . Short stature    , previously treated with growth hormone age 12-15 years.  Radford Pax syndrome    , previously followed by pediatric endocrinologist Freddy Jaksch, MD at [facility] through her early 36s).   Past Surgical History:  Past Surgical History:  Procedure  Laterality Date  . TEE WITHOUT CARDIOVERSION N/A 01/19/2018   Procedure: TRANSESOPHAGEAL ECHOCARDIOGRAM (TEE);  Surgeon: Jerline Pain, MD;  Location: [facility] ENDOSCOPY;  Service: Cardiovascular;  Laterality: N/A;  . TEE WITHOUT CARDIOVERSION N/A 02/02/2018   Procedure: TRANSESOPHAGEAL ECHOCARDIOGRAM (TEE);  Surgeon: Elouise Munroe, MD;  Location: [place];  Service: Cardiology;  Laterality: N/A;    Assessment / Plan / Recommendation Clinical Impression   Emilie B Lisle is a 38 year old right handed female with history of hypertension, hypothyroidism, Turner syndrome, scoliosis status post Harrington rod placement, bicuspid aortic valve. Per chart review and father, patient lives with her father. Independent prior to admission using a walker. 2 level home bedroom bath upstairs and 2 steps to entry. 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 Presented 01/30/2018 with left-sided weakness. She is found on the floor by her father unresponsive. Cranial CT scan reviewed, unremarkable for acute intracranial process. Patient did not receive TPA. CT angiogram of head and neck with no emergent large vessel occlusion. MRI /MRA showed scattered diffusion abnormality overlying the right cerebral convexity as well as the suprasellar cistern. Associated scattered multifocal acute early subacute ischemic infarcts. Patient is followed by infectious disease for recent bacteremia maintained on antibiotic therapy which had initially been scheduled to be completed 01/29/2018 and dates now changed to 02/26/2018. Repeat TEE showed no endocarditis or PFO. Tolerating a regular diet. Therapy evaluations completed with recommendations of physical medicine rehabilitation consult. Patient was admitted for a comprehensive rehabilitation program  on 02/03/2018.  SLP evaluation was completed on 02/04/2018 with results as follows:  Pt presents  with mild-moderate higher level cognitive deficits characterized by decreased functional problem solving, decreased emergent awareness of deficits, decreased recall of daily information, and decreased pragmatic interpretation of social cues.  Brief review of research on individuals with Turner syndrome indicates that difficulties with nonverbal learning, social skills, visuospatial processing, and attention are relatively common; however, individuals with Turner syndrome are typically normally functioning from an intellectual and cognitive standpoint.  Pt needed max assist to complete mental math calculations to count money but reports she was only "okay" with math at baseline.  If she has baseline nonverbal learning differences as appears to be common in those with Turner syndrome as mentioned above, these could now be exacerbated in the setting of acute profound hearing loss. However, given that pt was fully independent prior to recent hospitalizations, I suspect that some of these deficits are at least in part attributable to acute CVA.  As a result, pt would benefit from skilled ST while inpatient in order to maximize functional independence and reduce burden of care prior to discharge.  Anticipate that pt may need ST follow up at next level of care pending progress made while inpatient.    Skilled Therapeutic Interventions          Cognitive-linguistic evaluation completed with results and recommendations reviewed with patient.     SLP Assessment  Patient will need skilled [place] Pathology Services during CIR admission    Recommendations  Recommendations for Other Services: Neuropsych consult Patient destination: Home Follow up Recommendations: Outpatient SLP;24 hour supervision/assistance Equipment Recommended: None recommended by SLP    SLP Frequency 3 to 5 out of 7 days   SLP Duration  SLP Intensity  SLP Treatment/Interventions 7-10 days   Minumum of 1-2 x/day, 30 to 90  minutes  Cognitive remediation/compensation;Cueing hierarchy;Environmental controls;Internal/external aids;Patient/family education    Pain Pain Assessment Pain Scale: 0-10 Pain Score: 0-No pain  Prior Functioning Cognitive/Linguistic Baseline: Within functional limits Type of Home: House  Lives With: Family Available Help at Discharge: Family;Available 24 hours/day Vocation: Unemployed  Short Term Goals: Week 1: SLP Short Term Goal 1 (Week 1): STG=LTG due to ELOS  Refer to Care Plan for Long Term Goals  Recommendations for other services: Neuropsych  Discharge Criteria: Patient will be discharged from SLP if patient refuses treatment 3 consecutive times without medical reason, if treatment goals not met, if there is a change in medical status, if patient makes no progress towards goals or if patient is discharged from hospital.  The above assessment, treatment plan, treatment alternatives and goals were discussed and mutually agreed upon: by patient  Emilio Math 02/04/2018, 3:42 PM

## 2018-02-04 NOTE — Plan of Care (Signed)
  Problem: Consults Goal: RH STROKE PATIENT EDUCATION Description See Patient Education module for education specifics  Outcome: Progressing   Problem: RH BOWEL ELIMINATION Goal: RH STG MANAGE BOWEL WITH ASSISTANCE Description STG Manage Bowel with Mod.Assistance.  Outcome: Progressing Flowsheets (Taken 02/04/2018 1442) STG: Pt will manage bowels with assistance: 3-Moderate assistance   Problem: RH BLADDER ELIMINATION Goal: RH STG MANAGE BLADDER WITH ASSISTANCE Description STG Manage Bladder With Mod.Assistance  Outcome: Progressing Flowsheets (Taken 02/04/2018 1442) STG: Pt will manage bladder with assistance: 3-Moderate assistance   Problem: RH SKIN INTEGRITY Goal: RH STG SKIN FREE OF INFECTION/BREAKDOWN Description With Mod. assist  Outcome: Progressing Goal: RH STG MAINTAIN SKIN INTEGRITY WITH ASSISTANCE Description STG Maintain Skin Integrity With Mod Assistance.  Outcome: Progressing Flowsheets (Taken 02/04/2018 1442) STG: Maintain skin integrity with assistance: 4-Minimal assistance   Problem: RH SAFETY Goal: RH STG ADHERE TO SAFETY PRECAUTIONS W/ASSISTANCE/DEVICE Description STG Adhere to Safety Precautions With Mod. Assistance/Device.  Outcome: Progressing Flowsheets (Taken 02/04/2018 1442) STG:Pt will adhere to safety precautions with assistance/device: 4-Minimal assistance   Problem: RH PAIN MANAGEMENT Goal: RH STG PAIN MANAGED AT OR BELOW PT'S PAIN GOAL Description Less tha 3.  Outcome: Progressing

## 2018-02-04 NOTE — Progress Notes (Signed)
[place] PHYSICAL MEDICINE & REHABILITATION PROGRESS NOTE   Subjective/Complaints:  Profound hearing impairment, needs to read notepad  ROS- no breathing problems, no bowel or bladder issues reported although pt does wear diaper  Objective:   [place] Ekg Site Rite  Result Date: 02/03/2018 If Site Rite image not attached, placement could not be confirmed due to current cardiac rhythm.  Recent Labs    02/03/18 0843 02/04/18 0732  WBC 7.5 6.0  HGB 12.2 12.2  HCT 36.2 37.6  PLT 258 259   Recent Labs    02/03/18 0843 02/04/18 0732  NA 133* 134*  K 3.6 2.9*  CL 99 94*  CO2 24 28  GLUCOSE 98 95  BUN 6 5*  CREATININE 0.40* 0.46  CALCIUM 8.3* 8.7*   No intake or output data in the 24 hours ending 02/04/18 0903   Physical Exam: Vital Signs Blood pressure (!) 142/75, pulse 94, temperature 99 F (37.2 C), temperature source Oral, resp. rate 18, SpO2 99 %.   General: No acute distress Mood and affect are appropriate Heart: Regular rate and rhythm no rubs murmurs or extra sounds Lungs: Clear to auscultation, breathing unlabored, no rales or wheezes Abdomen: Positive bowel sounds, soft nontender to palpation, nondistended Extremities: No clubbing, cyanosis, or edema Skin: No evidence of breakdown, no evidence of rash Neurologic: Cranial nerves II through XII intact, motor strength is 5/5 in bilateral deltoid, bicep, tricep, grip, hip flexor, knee extensors, ankle dorsiflexor and plantar flexor Sensory exam normal sensation to light touch and proprioception in bilateral upper and lower extremities Cerebellar exam normal finger to nose to finger as well as heel to shin in bilateral upper and lower extremities Musculoskeletal: Full range of motion in all 4 extremities. No joint swelling   Assessment/Plan: 1. 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  Physiatrist is providing close team supervision and 24 hour management of active medical problems listed below.  Physiatrist and rehab team continue to assess barriers to discharge/monitor patient progress toward functional and medical goals  Care Tool:  Bathing              Bathing assist       Upper Body Dressing/Undressing Upper body dressing        Upper body assist      Lower Body Dressing/Undressing Lower body dressing            Lower body assist       Toileting Toileting    Toileting assist       Transfers Chair/bed transfer  Transfers assist           Locomotion Ambulation   Ambulation assist              Walk 10 feet activity   Assist           Walk 50 feet activity   Assist           Walk 150 feet activity   Assist           Walk 10 feet on uneven surface  activity   Assist           Wheelchair     Assist               Wheelchair 50 feet with 2 turns activity    Assist  Wheelchair 150 feet activity     Assist          Medical Problem List and Plan: 1.  Left side weakness secondary to multifocal infarct involving the right thalamic/IC, left SCA territory, right semi-ovale complicated by recent streptococcal pneumonia bacteremia/meningitis 2 weeks prior with subsequent loss of hearing CIR evals today .2.  DVT Prophylaxis/Anticoagulation: SCDs. 3. Pain Management: Tylenol as needed 4. Mood:  Prozac 40 mg daily.Provide emotional support 5. Neuropsych: This patient is not capable of making decisions on her own behalf. 6. Skin/Wound Care:  Routine skin checks 7. Fluids/Electrolytes/Nutrition:  Routine in and out's with follow-up chemistries BUN/creat normal 1/16 8. ID. Continue vancomycin and ceftriaxone through 02/26/2018 per infectious disease. 9.Hypothyroidism. Synthroid 10. Hyperlipidemia. Lipitor  HypoK add  supplement KCL 76meq BID  LOS: 1 days A FACE TO FACE EVALUATION WAS PERFORMED  Charlett Blake 02/04/2018, 9:03 AM

## 2018-02-04 NOTE — Evaluation (Signed)
Occupational Therapy Assessment and Plan  Patient Details  Name: Emma Martin MRN: [id number] Date of Birth: 17-Aug-1980  OT Diagnosis: abnormal posture, cognitive deficits, hemiplegia affecting non-dominant side and muscle weakness (generalized) Rehab Potential: Rehab Potential (ACUTE ONLY): Good ELOS: 7-10 days   Today's Date: 02/04/2018 OT Individual Time: 1350-1510 OT Individual Time Calculation (min): 80 min     Problem List:  Patient Active Problem List   Diagnosis Date Noted  . 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 Bacteremia   . Meningitis   . Cerebrovascular accident (CVA) ([place])   . Benign essential HTN   . Tachypnea   . Sensorineural hearing loss (SNHL) of both ears 01/31/2018  . Dyslipidemia 01/31/2018  . Acute ischemic stroke ([place]) 01/30/2018  . Pneumococcal bacteremia   . Pneumococcal meningitis   . Turner syndrome   . Hypothyroidism 01/15/2018  . Generalized anxiety disorder 01/15/2018  . Bicuspid aortic valve 04/06/2013  . HTN (hypertension) 04/06/2013    Past Medical History:  Past Medical History:  Diagnosis Date  . Bicuspid aortic valve    , mild aortic insufficiency. (No SBE prophylaxis needed)  . Complication of anesthesia   . Dyslipidemia   . Generalized anxiety disorder    , with obsessive-compulsive traits.  . Hypertension   . Hypothyroidism   . Mixed gonadal dysgenesis    , (additional Y-bearing cell line) for which she reportedly underwent bilateral gonadectomy (patient unaware; needs confirmation) due to malignancy potential.  . PONV (postoperative nausea and vomiting)   . Primary amenorrhea    , treated starting at age 45 or 62 with estrogen and progesterone  . Scoliosis    , treated with Harrington rods (1997) for stabilization.  . Seasonal allergic rhinitis   . Short stature    , previously treated with growth hormone age 1-15 years.  Radford Pax syndrome    , previously followed by pediatric endocrinologist Freddy Jaksch,  MD at [facility] through her early 68s).   Past Surgical History:  Past Surgical History:  Procedure Laterality Date  . TEE WITHOUT CARDIOVERSION N/A 01/19/2018   Procedure: TRANSESOPHAGEAL ECHOCARDIOGRAM (TEE);  Surgeon: Jerline Pain, MD;  [facility]: [facility] ENDOSCOPY;  Service: Cardiovascular;  Laterality: N/A;  . TEE WITHOUT CARDIOVERSION N/A 02/02/2018   Procedure: TRANSESOPHAGEAL ECHOCARDIOGRAM (TEE);  Surgeon: Elouise Munroe, MD;  Location: [place];  Service: Cardiology;  Laterality: N/A;    Assessment & Plan Clinical Impression: Patient is a 38 y.o. right handed female with history of hypertension, hypothyroidism, Turner syndrome, scoliosis status post Harrington rod placement, bicuspid aortic valve. Per chart review and father, patient lives with her father. Independent prior to admission using a walker. 2 level home bedroom bath upstairs and 2 steps to entry. 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 Presented 01/30/2018 with left-sided weakness. She is found on the floor by her father unresponsive. Cranial CT scan reviewed, unremarkable for acute intracranial process. Patient did not receive TPA. CT angiogram of head and neck with no emergent large vessel occlusion. MRI /MRA showed scattered diffusion abnormality overlying the right cerebral convexity as well as the suprasellar cistern. Associated scattered multifocal acute early subacute ischemic infarcts. Patient is followed by infectious disease for recent bacteremia maintained on antibiotic therapy which had initially been scheduled to be completed 01/29/2018 and dates now changed to 02/26/2018. Repeat TEE showed no endocarditis or PFO. Tolerating a regular diet. Therapy evaluations completed with recommendations of physical medicine rehabilitation consult.  Patient was admitted for a comprehensive rehabilitation program.    Patient  transferred to CIR on 02/03/2018 .    Patient currently requires min with basic self-care skills secondary to muscle weakness, decreased cardiorespiratoy endurance, unbalanced muscle activation and decreased coordination, decreased attention to left, decreased attention, decreased awareness, decreased safety awareness and decreased memory and decreased balance strategies.  Prior to hospitalization, patient could complete ADLs with supervision.  Patient will benefit from skilled intervention to increase independence with basic self-care skills prior to discharge home with care partner.  Anticipate patient will require 24 hour supervision and follow up home health.  OT - End of Session Activity Tolerance: Tolerates 30+ min activity with multiple rests Endurance Deficit: Yes Endurance Deficit Description: required frequent rest breaks, requesting to return to bed at end of session OT Assessment Rehab Potential (ACUTE ONLY): Good OT Barriers to Discharge: Incontinence OT Patient demonstrates impairments in the following area(s): Balance;Cognition;Endurance;Motor;Perception;Safety OT Basic ADL's Functional Problem(s): Grooming;Bathing;Dressing;Toileting OT Transfers Functional Problem(s): Toilet;Tub/Shower OT Additional Impairment(s): Fuctional Use of Upper Extremity OT Plan OT Intensity: Minimum of 1-2 x/day, 45 to 90 minutes OT Frequency: 5 out of 7 days OT Duration/Estimated Length of Stay: 7-10 days OT Treatment/Interventions: Balance/vestibular training;Cognitive remediation/compensation;Discharge planning;Disease mangement/prevention;DME/adaptive equipment instruction;Functional mobility training;Neuromuscular re-education;Pain management;Patient/family education;Psychosocial support;Self Care/advanced ADL retraining;Therapeutic Activities;Therapeutic Exercise;UE/LE Strength taining/ROM;UE/LE Coordination activities;Visual/perceptual remediation/compensation OT Basic Self-Care Anticipated  Outcome(s): Supervision OT Toileting Anticipated Outcome(s): Supervision OT Bathroom Transfers Anticipated Outcome(s): Supervision OT Recommendation Patient destination: Home Follow Up Recommendations: Home health OT;24 hour supervision/assistance Equipment Recommended: None recommended by OT   Skilled Therapeutic Intervention OT eval completed with discussion of OT purpose, POC, ELOS, and goals.  ADL assessment completed with toilet transfers, bathing, and dressing.  Pt ambulated to bathroom with RW with Min assist.  Pt completed transfer to toilet with RW with min assist, pt incontinent of bowel and bladder, unaware.  Ambulated to shower seat in room shower with RW and tactile cues.  Pt completed bathing at sit > stand level with min assist for standing balance.  Min cues for sequencing, ? If perseveration vs thoroughness with bathing as pt bathed entire body x2.  Completed dressing with min assist to don bra due to mild Lt inattention and decreased awareness.  Pt required assistance to thread BLE through pant legs and min assist for standing balance when pulling pants over hips.  Pt remained upright in w/c drying hair with father present for supervision.  Nurse tech notified of position and request to transfer back to bed when finished with hair.  OT Evaluation Precautions/Restrictions  Precautions Precautions: Fall Precaution Comments: Deaf General   Vital Signs Therapy Vitals Temp: 99.7 F (37.6 C) Temp Source: Oral Pulse Rate: (!) 107 Resp: 14 BP: (!) 151/90 Patient Position (if appropriate): Lying Oxygen Therapy SpO2: 99 % O2 Device: Room Air Pain Pain Assessment Pain Scale: 0-10 Pain Score: 0-No pain Home Living/Prior Functioning Home Living Family/patient expects to be discharged to:: Private residence Living Arrangements: Parent Available Help at Discharge: Family, Available 24 hours/day Type of Home: House Home Access: Stairs to enter Technical brewer of  Steps: 1 Entrance Stairs-Rails: Right, Left Home Layout: Two level, Bed/bath upstairs Alternate Level Stairs-Number of Steps: flight, with stair lift  Bathroom Shower/Tub: Multimedia programmer: Handicapped height Additional Comments: Dad had cared for his wife with ALS for 3 years before she died; therefore has stair lift, shower seat, 3 in 1  Lives With: Family Prior Function Level of Independence: Independent with transfers, Requires  assistive device for independence, Independent with gait, Needs assistance with ADLs  Able to Take Stairs?: Yes(for home entry, was doing flight of stairs prior to Christmas and worsening of infection) Vocation: Unemployed Comments: Pt was fully independent.  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  ADL ADL Grooming: Setup, Minimal assistance Where Assessed-Grooming: Standing at sink, Sitting at sink Upper Body Bathing: Contact guard Where Assessed-Upper Body Bathing: Shower Lower Body Bathing: Minimal assistance Where Assessed-Lower Body Bathing: Shower Upper Body Dressing: Minimal cueing, Minimal assistance Where Assessed-Upper Body Dressing: Sitting at sink Lower Body Dressing: Moderate assistance, Minimal cueing Where Assessed-Lower Body Dressing: Sitting at sink, Standing at sink Toileting: Minimal assistance Where Assessed-Toileting: Glass blower/designer: Psychiatric nurse Method: Counselling psychologist: Raised toilet seat, Grab bars(RW) Social research officer, government: Minimal assistance Social research officer, government Method: Print production planner with back, Grab bars Vision Baseline Vision/History: Wears glasses Wears Glasses: At all times Patient Visual Report: No change from baseline Vision Assessment?: Vision impaired- to be further tested in functional context Additional Comments: Pt with mild Lt inattention.  Able to track to left visual field with visual  cues Perception  Perception: Impaired Comments: ? Lt spatial inattention Cognition Overall Cognitive Status: Impaired/Different from baseline Arousal/Alertness: Awake/alert Orientation Level: Person;Place Year: 2020 Month: January Day of Week: Incorrect(Tuesday) Memory: Impaired Memory Impairment: Decreased recall of new information Decreased Short Term Memory: Functional complex Immediate Memory Recall: Sock;Blue;Bed Memory Recall: Blue Memory Recall Blue: With Cue Attention: Sustained Sustained Attention: Appears intact Sustained Attention Impairment: Functional complex Awareness: Impaired Awareness Impairment: Emergent impairment Problem Solving: Impaired Problem Solving Impairment: Functional complex Executive Function: Organizing;Self Monitoring;Self Correcting Organizing: Impaired Organizing Impairment: Functional complex Self Monitoring: Impaired Self Monitoring Impairment: Functional complex Self Correcting: Impaired Self Correcting Impairment: Functional complex Safety/Judgment: Impaired Sensation Sensation Light Touch: Appears Intact Coordination Gross Motor Movements are Fluid and Coordinated: No Fine Motor Movements are Fluid and Coordinated: No Motor  Motor Motor: Hemiplegia Motor - Skilled Clinical Observations: L side weakness/inattention Mobility  Bed Mobility Bed Mobility: Rolling Right;Supine to Sit Rolling Right: Supervision/verbal cueing Supine to Sit: Supervision/Verbal cueing Transfers Sit to Stand: Contact Guard/Touching assist  Extremity/Trunk Assessment RUE Assessment RUE Assessment: Within Functional Limits General Strength Comments: strength grossly 4/5 LUE Assessment LUE Assessment: Exceptions to [facility] Active Range of Motion (AROM) Comments: shoulder flexion to 150*, able to use WFL just slower than Rt General Strength Comments: strength grossly 4/5     Refer to Care Plan for Long Term Goals  Recommendations for other services:  None    Discharge Criteria: Patient will be discharged from OT if patient refuses treatment 3 consecutive times without medical reason, if treatment goals not met, if there is a change in medical status, if patient makes no progress towards goals or if patient is discharged from hospital.  The above assessment, treatment plan, treatment alternatives and goals were discussed and mutually agreed upon: by patient and by family  Keya Wynes, [facility] 02/04/2018, 4:03 PM

## 2018-02-04 NOTE — Progress Notes (Signed)
Patient information reviewed and entered into eRehab System by Becky Kaisy Severino, PPS coordinator. Information including medical coding, function ability, and quality indicators will be reviewed and updated through discharge.   

## 2018-02-04 NOTE — Progress Notes (Signed)
Social Work  Social Work Assessment and Plan  Patient Details  Name: Emma Martin MRN: [id number] Date of Birth: 1980-11-05  Today's Date: 02/04/2018  Problem List:  Patient Active Problem List   Diagnosis Date Noted  . Right thalamic infarction ([place]) 02/03/2018  . Bacteremia   . Meningitis   . Cerebrovascular accident (CVA) ([place])   . Benign essential HTN   . Tachypnea   . Sensorineural hearing loss (SNHL) of both ears 01/31/2018  . Dyslipidemia 01/31/2018  . Acute ischemic stroke ([place]) 01/30/2018  . Pneumococcal bacteremia   . Pneumococcal meningitis   . Turner syndrome   . Hypothyroidism 01/15/2018  . Generalized anxiety disorder 01/15/2018  . Bicuspid aortic valve 04/06/2013  . HTN (hypertension) 04/06/2013   Past Medical History:  Past Medical History:  Diagnosis Date  . Bicuspid aortic valve    , mild aortic insufficiency. (No SBE prophylaxis needed)  . Complication of anesthesia   . Dyslipidemia   . Generalized anxiety disorder    , with obsessive-compulsive traits.  . Hypertension   . Hypothyroidism   . Mixed gonadal dysgenesis    , (additional Y-bearing cell line) for which she reportedly underwent bilateral gonadectomy (patient unaware; needs confirmation) due to malignancy potential.  . PONV (postoperative nausea and vomiting)   . Primary amenorrhea    , treated starting at age 89 or 86 with estrogen and progesterone  . Scoliosis    , treated with Harrington rods (1997) for stabilization.  . Seasonal allergic rhinitis   . Short stature    , previously treated with growth hormone age 81-15 years.  Radford Pax syndrome    , previously followed by pediatric endocrinologist Freddy Jaksch, MD at [facility] through her early 39s).   Past Surgical History:  Past Surgical History:  Procedure Laterality Date  . TEE WITHOUT CARDIOVERSION N/A 01/19/2018   Procedure: TRANSESOPHAGEAL ECHOCARDIOGRAM (TEE);  Surgeon: Jerline Pain, MD;  Location: [facility]  ENDOSCOPY;  Service: Cardiovascular;  Laterality: N/A;  . TEE WITHOUT CARDIOVERSION N/A 02/02/2018   Procedure: TRANSESOPHAGEAL ECHOCARDIOGRAM (TEE);  Surgeon: Elouise Munroe, MD;  Location: [place];  Service: Cardiology;  Laterality: N/A;   Social History:  reports that she has never smoked. She has never used smokeless tobacco. She reports current alcohol use of about 1.0 - 2.0 standard drinks of alcohol per week. She reports that she does not use drugs.  Family / Support Systems Marital Status: Single Patient Roles: Other (Comment)(daughter and sister) Other Supports: [place]-Dad [phone]-cell   Elizabeth-sister [phone] in [place] Anticipated Caregiver: Dad Ability/Limitations of Caregiver: Retired and in good health Caregiver Availability: 24/7 Family Dynamics: Close knit small family Dad has been assisting pt since her menigitis diagnosis in Dec. She has friends and chruch members who are supportive, but difficult now can not hear due to antibiotics.  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 Will look toward her Dad since he is her next of kin and family member here all of the time   Abuse/Neglect Abuse/Neglect Assessment Can Be Completed: Yes(have to write down on a pad and she can provide answeres) Physical Abuse: Denies Verbal Abuse: Denies Sexual Abuse: Denies Exploitation of patient/patient's resources: Denies Self-Neglect: Denies  Emotional Status Pt's affect, behavior and adjustment status: Dad provides information he reports she was finishing her treatment for menigitis  when she had a stroke. He was assisitng her with the IV antibiotics and pt was doing well except losing her hearing. Prior to this she was independent and doing for herself.  Recent Psychosocial Issues: other health issues Turners Syndrome and scoliosis but managed to be independent Psychiatric History: History of depression takes prozac for this and according to Dad finds it helpful She has been through a lot in a short period of time, will ask neuro-psych to see while here. Substance Abuse History: No issues  Patient / Family Perceptions, Expectations & Goals Pt/Family understanding of illness & functional limitations: Dad can explain her stroke and deficits, although it has been difficult due to inability to hear and follow the medical staff commands. He does talk with the MD's and feels he has a good understanding of daughter's condition and treatment plan going forward. he knows he will be assisitng with her IV antibiotics again. Premorbid pt/family roles/activities: Daughter, sister, friend, etc Anticipated changes in roles/activities/participation: resume Pt/family expectations/goals: Dad states: " I hope she does well it doesn't seem like anything else can happen it already has." Pt wrote I am tired.   Community Resources [employer]: Other (Comment)(Scheduled for [facility]-cochlear implants) Premorbid Home Care/DME Agencies: Other (Comment)([facility]- active patient) Transportation available at discharge: Family Resource referrals recommended: Neuropsychology, Support group (specify)  Discharge Planning Living Arrangements: Parent Support Systems: Parent, Other relatives, Friends/neighbors Type of Residence: Private residence Insurance Resources: Self-pay(Attempting to get [employer] and applying for [employer]) Financial Resources: Family Support Financial Screen Referred: Yes Living Expenses: Lives with family Money Management: Family Does the patient have any problems obtaining your  medications?: Yes (Describe)(Now uninsured since 12/19 when [place] lapsed) Home Management: Dad Patient/Family Preliminary Plans: Return home with Dad who has assisted with her care since 12/19 menigitis diagnosed. Her sister in [place] is supportive but works and [employer] in [place]. Her father feels and hopes this is it and nothing else will happen to her. Sw Barriers to Discharge: Other (comments) Sw Barriers to Discharge Comments: recent hearing loss difficult to communicate with-white board Social Work Anticipated Follow Up Needs: HH/OP, Support Group  Clinical Impression Pleasant patient who smiles when talked too and supportive-involved Father. He provided the information for the assessment. He has been assisting with her care since her meningitis diagnosis in 12/2017, thought were done when she had a stroke. He can assist with her care he took care of his wife-pt's mom after her ALS diagnosis and has since passed away. He plans to be here daily and provide support, aware therapy team evaluating pt and setting goals. Will work on discharge needs and see any other communication board can be used for pt while here. Until she can get her cochlear implants.  Elease Hashimoto 02/04/2018, 1:34 PM

## 2018-02-05 ENCOUNTER — Inpatient Hospital Stay (HOSPITAL_COMMUNITY): Payer: Self-pay

## 2018-02-05 ENCOUNTER — Inpatient Hospital Stay: Payer: BLUE CROSS/BLUE SHIELD | Admitting: Infectious Diseases

## 2018-02-05 ENCOUNTER — Inpatient Hospital Stay (HOSPITAL_COMMUNITY): Payer: Self-pay | Admitting: Occupational Therapy

## 2018-02-05 ENCOUNTER — Inpatient Hospital Stay (HOSPITAL_COMMUNITY): Payer: Self-pay | Admitting: Physical Therapy

## 2018-02-05 ENCOUNTER — Inpatient Hospital Stay (HOSPITAL_COMMUNITY): Payer: Self-pay | Admitting: Speech Pathology

## 2018-02-05 LAB — BASIC METABOLIC PANEL
Anion gap: 13 (ref 5–15)
BUN: 6 mg/dL (ref 6–20)
CO2: 25 mmol/L (ref 22–32)
Calcium: 8.4 mg/dL — ABNORMAL LOW (ref 8.9–10.3)
Chloride: 92 mmol/L — ABNORMAL LOW (ref 98–111)
Creatinine, Ser: 0.41 mg/dL — ABNORMAL LOW (ref 0.44–1.00)
GFR calc Af Amer: >60 mL/min (ref >60)
GFR calc non Af Amer: >60 mL/min (ref >60)
Glucose, Bld: 114 mg/dL — ABNORMAL HIGH (ref 70–99)
Potassium: 3.1 mmol/L — ABNORMAL LOW (ref 3.5–5.1)
Sodium: 130 mmol/L — ABNORMAL LOW (ref 135–145)

## 2018-02-05 LAB — OSMOLALITY: OSMOLALITY: 268 mosm/kg — AB (ref 275–295)

## 2018-02-05 LAB — VANCOMYCIN, TROUGH: Vancomycin Tr: 10 ug/mL — ABNORMAL LOW (ref 15–20)

## 2018-02-05 MED ORDER — VANCOMYCIN HCL 10 G IV SOLR
1250.0000 mg | Freq: Four times a day (QID) | INTRAVENOUS | Status: DC
  Administered 2018-02-06 (×4): 1250 mg via INTRAVENOUS
  Filled 2018-02-05 (×6): qty 1250

## 2018-02-05 NOTE — Progress Notes (Signed)
Speech Language Pathology Daily Session Note  Patient Details  Name: Emma Martin MRN: [id number] Date of Birth: 1980/06/23  Today's Date: 02/05/2018 SLP Individual Time: 1130-1200 SLP Individual Time Calculation (min): 30 min  Short Term Goals: Week 1: SLP Short Term Goal 1 (Week 1): STG=LTG due to ELOS  Skilled Therapeutic Interventions:  Skilled treatment session focused on cognition goals. SLP received pt in bed with her father present. Pt able to reposition herself to sit upright comfortably in bed. However pt with difficulty remaining upright as she kept leaning to her right. Pt required Max A cues (voice to text and visual as well as physical). Additionally, pt continuously pressed button to recline herself in bed despite multiple reminders. This appears to be a perseverative behavior - her father states that she will lay flat but continuously presses button to raise and lower her legs. Pt abe to complete simple medication management task. She required Max A to Mod A to demonstrate intellectual awareness of deficits and purpose of rehab. Pt was left in bed, bed alarm on and father present.      Pain Pain Assessment Pain Scale: 0-10 Pain Score: 0-No pain  Therapy/Group: Individual Therapy  Mckinzi Eriksen 02/05/2018, 2:29 PM

## 2018-02-05 NOTE — Progress Notes (Signed)
[facility] PHYSICAL MEDICINE & REHABILITATION PROGRESS NOTE   Subjective/Complaints:  Profound hearing impairment, needs to read notepad  Has heache and neck pain   ROS- no breathing problems, no bowel or bladder issues reported although pt does wear diaper  Objective:   [place] Ekg Site Rite  Result Date: 02/03/2018 If Site Rite image not attached, placement could not be confirmed due to current cardiac rhythm.  Recent Labs    02/03/18 0843 02/04/18 0732  WBC 7.5 6.0  HGB 12.2 12.2  HCT 36.2 37.6  PLT 258 259   Recent Labs    02/04/18 0732 02/05/18 0354  NA 134* 130*  K 2.9* 3.1*  CL 94* 92*  CO2 28 25  GLUCOSE 95 114*  BUN 5* 6  CREATININE 0.46 0.41*  CALCIUM 8.7* 8.4*    Intake/Output Summary (Last 24 hours) at 02/05/2018 6213 Last data filed at 02/05/2018 0013 Gross per 24 hour  Intake 120 ml  Output 0 ml  Net 120 ml     Physical Exam: Vital Signs Blood pressure (!) 150/82, pulse 93, temperature 98.8 F (37.1 C), temperature source Oral, resp. rate 16, SpO2 99 %.   General: No acute distress Mood and affect are appropriate Heart: Regular rate and rhythm no rubs murmurs or extra sounds Lungs: Clear to auscultation, breathing unlabored, no rales or wheezes Abdomen: Positive bowel sounds, soft nontender to palpation, nondistended Extremities: No clubbing, cyanosis, or edema Skin: No evidence of breakdown, no evidence of rash Neurologic: Cranial nerves II through XII intact, motor strength is 5/5 in bilateral deltoid, bicep, tricep, grip, hip flexor, knee extensors, ankle dorsiflexor and plantar flexor Sensory exam normal sensation to light touch and proprioception in bilateral upper and lower extremities Cerebellar exam normal finger to nose to finger as well as heel to shin in bilateral upper and lower extremities Musculoskeletal: Full range of motion in all 4 extremities. No joint swelling   Assessment/Plan: 1. 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  Physiatrist is providing close team supervision and 24 hour management of active medical problems listed below.  Physiatrist and rehab team continue to assess barriers to discharge/monitor patient progress toward functional and medical goals  Care Tool:  Bathing    Body parts bathed by patient: Right arm, Left arm, Chest, Abdomen, Front perineal area, Buttocks, Right upper leg, Left upper leg, Right lower leg, Left lower leg, Face         Bathing assist Assist Level: Minimal Assistance - Patient > 75%     Upper Body Dressing/Undressing Upper body dressing   What is the patient wearing?: Pull over shirt    Upper body assist Assist Level: Minimal Assistance - Patient > 75%    Lower Body Dressing/Undressing Lower body dressing      What is the patient wearing?: Pants     Lower body assist Assist for lower body dressing: Moderate Assistance - Patient 50 - 74%     Toileting Toileting    Toileting assist Assist for toileting: Minimal Assistance - Patient > 75%     Transfers Chair/bed transfer  Transfers assist     Chair/bed transfer assist level: Minimal Assistance - Patient > 75%     Locomotion Ambulation   Ambulation assist      Assist level: Minimal Assistance - Patient > 75% Assistive device: Walker-rolling Max distance: 200   Walk 10 feet  activity   Assist     Assist level: Minimal Assistance - Patient > 75% Assistive device: (no device)   Walk 50 feet activity   Assist    Assist level: Minimal Assistance - Patient > 75% Assistive device: Walker-rolling    Walk 150 feet activity   Assist    Assist level: Minimal Assistance - Patient > 75% Assistive device: Walker-rolling    Walk 10 feet on uneven surface  activity   Assist Walk 10 feet on uneven surfaces activity did  not occur: Safety/medical concerns         Wheelchair     Assist Will patient use wheelchair at discharge?: No Type of Wheelchair: Manual    Wheelchair assist level: Minimal Assistance - Patient > 75% Max wheelchair distance: 100    Wheelchair 50 feet with 2 turns activity    Assist    Wheelchair 50 feet with 2 turns activity did not occur: Safety/medical concerns       Wheelchair 150 feet activity     Assist Wheelchair 150 feet activity did not occur: Safety/medical concerns        Medical Problem List and Plan: 1.  Left side weakness secondary to multifocal infarct involving the right thalamic/IC, left SCA territory, right semi-ovale complicated by recent streptococcal pneumonia bacteremia/meningitis 2 weeks prior with subsequent loss of hearing CIR PT, OT, SLP .2.  DVT Prophylaxis/Anticoagulation: SCDs. 3. Pain Management: Tylenol as needed, headache if tylenol ineffective consider topamax 4. Mood:  Prozac 40 mg daily.Provide emotional support 5. Neuropsych: This patient is not capable of making decisions on her own behalf. 6. Skin/Wound Care:  Routine skin checks 7. Fluids/Electrolytes/Nutrition:  Routine in and out's with follow-up chemistries BUN/creat normal 1/17 8. ID. Continue vancomycin and ceftriaxone through 02/26/2018 per infectious disease. 9.Hypothyroidism. Synthroid 10. Hyperlipidemia. Lipitor  HypoK improving just added  supplement KCL 41meq BID on 1/16 11.  Hyponatremia- check urine and serum osmolality LOS: 2 days A FACE TO FACE EVALUATION WAS PERFORMED  Charlett Blake 02/05/2018, 9:27 AM

## 2018-02-05 NOTE — Progress Notes (Signed)
Still waiting on vanc trough

## 2018-02-05 NOTE — Progress Notes (Signed)
Pharmacy Antibiotic Note  Emma Martin is a 38 y.o. female admitted on 02/03/2018 with left sided weakness and found to have multiple acute strokes concern for embolic phenomenon possibly related to an infectious source. Pt completed a course of Rocephin + Vancomycin on 01/29/2018 for strep pna bacteremia and meningitis.  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   The patient's Vancomycin trough this evening remains SUBtherapeutic (VT 10 mcg/ml, goal of 15-20 mcg/ml). Doses appear to have been given on schedule and trough was drawn on time - so assumed accurate. SCr remains low but stable. Will increase the dose and plan to recheck another level at steady state on 1/18 evening.   Plan: Increase Vancomycin to 1250 mg IV Q 6 hours  Continue Rocephin 2gm IV q12h. Continue both antibiotics through 02/26/18 Recommend collecting repeat VT after 4 - 5 doses.     Temp (24hrs), Avg:98.3 F (36.8 C), Min:98 F (36.7 C), Max:98.8 F (37.1 C)  Recent Labs  Lab 01/30/18 0927 01/30/18 0934 01/30/18 1719  02/02/18 0441 02/03/18 0843  02/04/18 0732 02/04/18 1547 02/05/18 0354 02/05/18 1730  WBC 10.4  --   --   --   --  7.5  --  6.0  --   --   --   CREATININE 0.43* 0.20*  --   --  0.48 0.40*  --  0.46  --  0.41*  --   VANCOTROUGH  --   --   --    < >  --   --    < >  --  8*  --  10*  VANCORANDOM  --   --  <4  --   --   --   --   --   --   --   --    < > = values in this interval not displayed.    Estimated Creatinine Clearance: 73.4 mL/min (A) (by C-G formula based on SCr of 0.41 mg/dL (L)).    Allergies  Allergen Reactions  . Benzoin Rash    Antimicrobials this admission: Vanc 1/11 >> Rocephin 1/11 >>  Dose adjustments this admission: 02/01/2018 VT 11 >> dose increased to 1250mg  IV q8h 02/03/2018 VT 8 >>>dose increased to 750 mg IV Q 6h 02/04/2018 VT 8 >>> dose increased to 1000 mg IV Q 6 hrs 02/05/2018 VT 10 >> dose  increased to 1250 mg IV Q 6hrs  Microbiology results: 1/11 BCx: ngtd 1/11 MRSA PCR: negative  Thank you for allowing pharmacy to be a part of this patient's care.  Alycia Rossetti, PharmD, BCPS Clinical Pharmacist Clinical phone for 02/05/2018: [phone] 02/05/2018 6:57 PM   **Pharmacist phone directory can now be found on [email or web address] ([email or web address]).  Listed under [facility].

## 2018-02-05 NOTE — Progress Notes (Addendum)
Physical Therapy Session Note  Patient Details  Name: Emma Martin MRN: [id number] Date of Birth: Dec 28, 1980  Today's Date: 02/05/2018 PT Individual Time: 2778-2423 PT Individual Time Calculation (min): 86 min   Short Term Goals: Week 1:  PT Short Term Goal 1 (Week 1): STG=LTG due to ELOS  Skilled Therapeutic Interventions/Progress Updates:  Pt received asleep in bed but easily awakened. Pt's father Emma Martin) present for session. Utilized written communication to communicate with pt throughout session. Pt transferred supine<>sitting EOB with supervision. Pt found to be incontinent of urine and ambulates into bathroom with RW & min assist. Assisted pt with clothing management and donning clean brief; pt with continent void & BM on toilet and performed peri hygiene with supervision. Pt dons gown with assistance and performs hand hygiene at sink. Transported pt to gym via w/c dependent assist for time management. Pt ambulates multiple times up to ~100 ft with RW and without AD with CGA<>min assist with pt demonstrating decreased attention to L side of environment & RW. During gait with RW pt demonstrates impaired heel strike LLE so donned regular tennis shoes and L foot-up brace with pt demonstrating improved heel strike with orthosis. Educated pt's family on recommendation of ambulating without AD during therapy to focus on dynamic balance, weight shifting & L NMR during gait, as well as use of L foot-up brace to provide tactile cuing for increased dorsiflexion and heel strike LLE during gait. Discussed home set up & need to ensure clear floors to reduce tripping hazards & fall risks; educated pt's father on ability to build up couch seat with quilts to increase seat height and ease of sit>stand transfer if necessary. Pt completed Berg Balance Test & scored 17/56; educated pt's father on interpretation of score & pt's fall risk. Patient demonstrates increased fall risk as noted by score of 17/56 on  Berg Balance Scale.  (<36= high risk for falls, close to 100%; 37-45 significant >80%; 46-51 moderate >50%; 52-55 lower >25%). Throughout session pt lies back on mat multiple times 2/2 ?fatigue. Pt with impaired memory as she was asking where her deceased mother is and appearing anxious without father nearby. At end of session pt left in bed with alarm set & family present to supervise, call bell in reach.    Therapy Documentation Precautions:  Precautions Precautions: Fall Precaution Comments: Deaf Restrictions Weight Bearing Restrictions: No  Pain: No behaviors demonstrating, or c/o pain during session.   Balance: Standardized Balance Assessment Standardized Balance Assessment: Berg Balance Test Berg Balance Test Sit to Stand: Able to stand without using hands and stabilize independently Standing Unsupported: Able to stand 30 seconds unsupported Sitting with Back Unsupported but Feet Supported on Floor or Stool: Able to sit 2 minutes under supervision Stand to Sit: Uses backs of legs against chair to control descent Transfers: Needs one person to assist Standing Unsupported with Eyes Closed: Able to stand 10 seconds with supervision Standing Ubsupported with Feet Together: Needs help to attain position and unable to hold for 15 seconds From Standing, Reach Forward with Outstretched Arm: Reaches forward but needs supervision From Standing Position, Pick up Object from Floor: Unable to try/needs assist to keep balance From Standing Position, Turn to Look Behind Over each Shoulder: Needs supervision when turning Turn 360 Degrees: Needs assistance while turning Standing Unsupported, Alternately Place Feet on Step/Stool: Needs assistance to keep from falling or unable to try Standing Unsupported, One Foot in Front: Loses balance while stepping or standing Standing on One Leg: Unable  to try or needs assist to prevent fall Total Score: 17   Therapy/Group: Individual Therapy  Emma Martin 02/05/2018, 3:51 PM

## 2018-02-05 NOTE — Progress Notes (Signed)
Occupational Therapy Session Note  Patient Details  Name: Emma Martin MRN: [id number] Date of Birth: May 22, 1980  Today's Date: 02/05/2018 OT Individual Time: 0800-0900 OT Individual Time Calculation (min): 60 min    Short Term Goals: Week 1:  OT Short Term Goal 1 (Week 1): STG = LTGs due to ELOS  Skilled Therapeutic Interventions/Progress Updates:    Treatment session with focus on ADL retraining.  Pt received upright in bed with RN present providing AM meds.  RN reports pt has not eaten breakfast, pt agreeable to eat breakfast.  Therapist assisted with setting up tray, pt able to complete self-feeding after setup.  Engaged in dressing tasks with setup assist.  Pt reports need to toilet.  Ambulated to toilet with RW with CGA.  Pt completed toileting tasks with setup and CGA when standing for hygiene.  Pt completed hand hygiene and oral care while standing at sink with close supervision.  Pt returned to supine at end of session and left semi-reclined in bed.  Therapy Documentation Precautions:  Precautions Precautions: Fall Precaution Comments: Deaf Restrictions Weight Bearing Restrictions: No Pain: Pt with c/o headache, RN notified  Therapy/Group: Individual Therapy  Simonne Come 02/05/2018, 9:44 AM

## 2018-02-05 NOTE — Progress Notes (Signed)
Patient observed throughout shift with verbal responses to questions with out cueing nd direct facial contact appropriately. When asked directly if she can hear writer talking to her she becomes non-verbal and close her eyes or stare in opposite direction. Monitor,.

## 2018-02-05 NOTE — IPOC Note (Signed)
Overall Plan of Care [facility]) Patient Details Name: Emma Martin MRN: [id number] DOB: 03-08-1980  Admitting Diagnosis: <principal problem not specified>  Hospital Problems: Active Problems:   Right thalamic infarction [facility])     Functional Problem List: Nursing Bladder, Bowel, Endurance, Motor, Nutrition, Safety, Skin Integrity  PT Balance, Motor, Safety, Perception, Endurance  OT Balance, Cognition, Endurance, Motor, Perception, Safety  SLP Cognition  TR         Basic ADL's: OT Grooming, Bathing, Dressing, Toileting     Advanced  ADL's: OT       Transfers: PT Bed Mobility, Bed to Chair, Car, Manufacturing systems engineer, Metallurgist: PT Ambulation, Stairs     Additional Impairments: OT Fuctional Use of Upper Extremity  SLP Social Cognition   Problem Solving, Memory, Attention, Awareness  TR      Anticipated Outcomes Item Anticipated Outcome  Self Feeding    Swallowing      Basic self-care  Supervision  Toileting  Supervision   Bathroom Transfers Supervision  Bowel/Bladder  Continent to bowel and bladder with mod. assist.  Transfers  supervision  Locomotion  supervision  Communication     Cognition  supervision  Pain  Less than 3,on 1 to 10 scale.  Safety/Judgment  Free from fall during her stay in rehab.   Therapy Plan: PT Intensity: Minimum of 1-2 x/day ,45 to 90 minutes PT Frequency: 5 out of 7 days PT Duration Estimated Length of Stay: 7-10 OT Intensity: Minimum of 1-2 x/day, 45 to 90 minutes OT Frequency: 5 out of 7 days OT Duration/Estimated Length of Stay: 7-10 days SLP Intensity: Minumum of 1-2 x/day, 30 to 90 minutes SLP Frequency: 3 to 5 out of 7 days SLP Duration/Estimated Length of Stay: 7-10 days     Team Interventions: Nursing Interventions Patient/Family Education, Bladder Management, Bowel Management, Disease Management/Prevention, Cognitive Remediation/Compensation, Discharge Planning  PT interventions  Balance/vestibular training, Ambulation/gait training, Cognitive remediation/compensation, Functional mobility training, DME/adaptive equipment instruction, Patient/family education, Stair training, Therapeutic Exercise, UE/LE Coordination activities, Wheelchair propulsion/positioning, Visual/perceptual remediation/compensation, UE/LE Strength taining/ROM, Therapeutic Activities  OT Interventions Balance/vestibular training, Cognitive remediation/compensation, Discharge planning, Disease mangement/prevention, DME/adaptive equipment instruction, Functional mobility training, Neuromuscular re-education, Pain management, Patient/family education, Psychosocial support, Self Care/advanced ADL retraining, Therapeutic Activities, Therapeutic Exercise, UE/LE Strength taining/ROM, UE/LE Coordination activities, Visual/perceptual remediation/compensation  SLP Interventions Cognitive remediation/compensation, Cueing hierarchy, Environmental controls, Internal/external aids, Patient/family education  TR Interventions    SW/CM Interventions Discharge Planning, Psychosocial Support, Patient/Family Education   Barriers to Discharge MD  Medical stability  Nursing      PT Incontinence    OT Incontinence    SLP      SW Other (comments) recent hearing loss difficult to communicate with-white board   Team Discharge Planning: Destination: PT-Home ,OT- Home , SLP-Home Projected Follow-up: PT-Home health PT, OT-  Home health OT, 24 hour supervision/assistance, SLP-Outpatient SLP, 24 hour supervision/assistance Projected Equipment Needs: PT-None recommended by PT, OT- None recommended by OT, SLP-None recommended by SLP Equipment Details: PT- , OT-  Patient/family involved in discharge planning: PT- Patient, Family member/caregiver,  OT-Patient, Family member/caregiver, SLP-Patient  MD ELOS: 7-11d Medical Rehab Prognosis:  Good Assessment:  38 year old right handed female with history of hypertension,  hypothyroidism, Turner syndrome, scoliosis status post Harrington rod placement, bicuspid aortic valve. Per chart review and father, patient lives with her father. Independent prior to admission using a walker. 2 level home bedroom bath upstairs and 2 steps to entry. Patient recently hospitalized for  streptococcal meningitis and finished antibiotic treatment with subsequent hearing loss bilaterally. Presented 01/30/2018 with left-sided weakness. She is found on the floor by her father unresponsive. Cranial CT scan reviewed, unremarkable for acute intracranial process. Patient did not receive TPA. CT angiogram of head and neck with no emergent large vessel occlusion. MRI /MRA showed scattered diffusion abnormality overlying the right cerebral convexity as well as the suprasellar cistern. Associated scattered multifocal acute early subacute ischemic infarcts. Patient is followed by infectious disease for recent bacteremia maintained on antibiotic therapy which had initially been scheduled to be completed 01/29/2018 and dates now changed to 02/26/2018. Repeat TEE showed no endocarditis or PFO.    See Team Conference Notes for weekly updates to the plan of care

## 2018-02-06 ENCOUNTER — Inpatient Hospital Stay (HOSPITAL_COMMUNITY): Payer: Self-pay | Admitting: Speech Pathology

## 2018-02-06 ENCOUNTER — Inpatient Hospital Stay (HOSPITAL_COMMUNITY): Payer: Self-pay | Admitting: Occupational Therapy

## 2018-02-06 ENCOUNTER — Inpatient Hospital Stay (HOSPITAL_COMMUNITY): Payer: Self-pay | Admitting: Physical Therapy

## 2018-02-06 DIAGNOSIS — I639 Cerebral infarction, unspecified: Secondary | ICD-10-CM

## 2018-02-06 DIAGNOSIS — E871 Hypo-osmolality and hyponatremia: Secondary | ICD-10-CM

## 2018-02-06 LAB — VANCOMYCIN, TROUGH: Vancomycin Tr: 11 ug/mL — ABNORMAL LOW (ref 15–20)

## 2018-02-06 MED ORDER — SODIUM CHLORIDE 0.9 % IV SOLN
INTRAVENOUS | Status: DC | PRN
  Filled 2018-02-06: qty 1000

## 2018-02-06 MED ORDER — VANCOMYCIN HCL 10 G IV SOLR
1750.0000 mg | Freq: Four times a day (QID) | INTRAVENOUS | Status: DC
  Administered 2018-02-06 – 2018-02-09 (×11): 1750 mg via INTRAVENOUS
  Filled 2018-02-06 (×14): qty 1750

## 2018-02-06 NOTE — Progress Notes (Addendum)
Physical Therapy Session Note  Patient Details  Name: Emma Martin MRN: [id number] Date of Birth: 01-09-1981  Today's Date: 02/06/2018 PT Individual Time: 8889-1694 PT Individual Time Calculation (min): 85 min   Short Term Goals: Week 1:  PT Short Term Goal 1 (Week 1): STG=LTG due to ELOS  Skilled Therapeutic Interventions/Progress Updates:  Pt received in recliner & agreeable to tx. Pt completes stand pivot recliner>w/c and w/c<>toilet with supervision. Pt with continent BM & void on toilet and performs peri hygiene without assistance. Pt completes hand hygiene at sink from w/c level with supervision. Assisted pt with donning tennis shoes; pt frequently looks to & asks father for assistance instead of attempting tasks herself. Pt ambulates 200 ft without AD & min assist with max cuing for path as pt with impaired recall of lap around gym, easily distracted in environment, and decreased attention overall & to L. Pt engaged in standing ball toss with normal and narrow BOS with min assist with task focusing on challenges to balance. Attempted to have pt perform 10x sit<>stand for BLE strengthening but pt with great difficulty following written instruction, demonstration and tactile cuing for correct technique. Pt stood on compliant surface with intermittent RUE support and close supervision while engaging in pipe tree assembly. Pt able to assemble simple shape from pre-selected pieces, then 2 slightly more challenging designs from choice of many, with ability to correct errors and problem solve with min cuing and significantly extra time with trial and error. Pt utilized kinetron in sitting then standing with BUE support with task focusing on BLE strengthening, weight shifting & balance with therapist providing multimodal cuing for upright posture. At end of session pt left in bed with alarm set, call bell in lap, & father present in room.    Pt demonstrates cognitive deficits as she continues to  ask where her deceased mother is but is oriented to month, year, and location [name]") with therapist educating her on stroke. Educated pt on impaired memory & purpose of therapy.   Discussed pt frequently laying down during all therapy sessions with pt reporting it's probably due to mental and a little physical fatigue.   Utilized written communication to communicate with pt throughout session.   No c/o pain during session.  Therapy Documentation Precautions:  Precautions Precautions: Fall Precaution Comments: Deaf Restrictions Weight Bearing Restrictions: No    Therapy/Group: Individual Therapy  [name] 02/06/2018, 2:30 PM

## 2018-02-06 NOTE — Plan of Care (Signed)
  Problem: Consults Goal: RH STROKE PATIENT EDUCATION Description See Patient Education module for education specifics  Outcome: Progressing   Problem: RH BOWEL ELIMINATION Goal: RH STG MANAGE BOWEL WITH ASSISTANCE Description STG Manage Bowel with Mod.Assistance.  Outcome: Progressing   Problem: RH BLADDER ELIMINATION Goal: RH STG MANAGE BLADDER WITH ASSISTANCE Description STG Manage Bladder With Mod.Assistance  Outcome: Progressing   Problem: RH SKIN INTEGRITY Goal: RH STG SKIN FREE OF INFECTION/BREAKDOWN Description With Mod. assist  Outcome: Progressing Goal: RH STG MAINTAIN SKIN INTEGRITY WITH ASSISTANCE Description STG Maintain Skin Integrity With Mod Assistance.  Outcome: Progressing   Problem: RH SAFETY Goal: RH STG ADHERE TO SAFETY PRECAUTIONS W/ASSISTANCE/DEVICE Description STG Adhere to Safety Precautions With Mod. Assistance/Device.  Outcome: Progressing   Problem: RH PAIN MANAGEMENT Goal: RH STG PAIN MANAGED AT OR BELOW PT'S PAIN GOAL Description Less tha 3.  Outcome: Progressing

## 2018-02-06 NOTE — Progress Notes (Signed)
Pharmacy Antibiotic Note  Emma Martin is a 38 y.o. female admitted on 02/03/2018 with left sided weakness and found to have multiple acute strokes concern for embolic phenomenon possibly related to an infectious source. She completed a course of ceftriaxone and vancomycin on 01/29/2018 for S. pneumoniae bacteremia and meningitis. 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   Vancomycin trough this evening still remains below goal of 15-46mcg/mL at 39mcg/mL despite recent dose increase. Trough drawn ~5 hrs after dose given, so should represent close to true trough. Doses appear to have been given on schedule. Scr remains low stable ~0.4. Will increase the dose and plan to recheck another level at steady state on 1/18 evening.   Plan: Increase vancomycin to 1750mg  IV q6h (estimated trough ~15.4)  Continue ceftriaxone 2g IV q12h Continue both antibiotics through 02/26/18 Collect repeat VT after 4-5 doses  Temp (24hrs), Avg:98.2 F (36.8 C), Min:97.8 F (36.6 C), Max:98.4 F (36.9 C)  Recent Labs  Lab 02/02/18 0441 02/03/18 0843  02/04/18 0732  02/05/18 0354 02/05/18 1730 02/06/18 1744  WBC  --  7.5  --  6.0  --   --   --   --   CREATININE 0.48 0.40*  --  0.46  --  0.41*  --   --   VANCOTROUGH  --   --    < >  --    < >  --  10* 11*   < > = values in this interval not displayed.    Estimated Creatinine Clearance: 73.4 mL/min (A) (by C-G formula based on SCr of 0.41 mg/dL (L)).    Allergies  Allergen Reactions  . Benzoin Rash   Antimicrobials this admission: Vanc 1/11 >> Rocephin 1/11 >>  Dose adjustments this admission: 02/01/2018 VT 11 >> dose increased to 1250mg  IV q8h 02/03/2018 VT 8 >>>dose increased to 750mg  IV q6h 02/04/2018 VT 8 >>> dose increased to 1000mg  IV q6h 02/05/2018 VT 10 >> dose increased to 1250mg  IV q6h 02/06/2018 VT 11 >> dose increased to 1750mg  IV q6h  Microbiology results: 1/11 BCx: NGTD 1/11  MRSA PCR: negative  Thank you for allowing pharmacy to be a part of this patient's care.  Mila Merry Gerarda Fraction, PharmD, [place] PGY2 Infectious Diseases Pharmacy Resident Phone: [phone] 02/06/18   7:15 PM

## 2018-02-06 NOTE — Progress Notes (Signed)
[facility] PHYSICAL MEDICINE & REHABILITATION PROGRESS NOTE   Subjective/Complaints:  No new problems reported or indicated by patient  ROS: Limited due to cognitive/behavioral   Objective:   No results found. Recent Labs    02/04/18 0732  WBC 6.0  HGB 12.2  HCT 37.6  PLT 259   Recent Labs    02/04/18 0732 02/05/18 0354  NA 134* 130*  K 2.9* 3.1*  CL 94* 92*  CO2 28 25  GLUCOSE 95 114*  BUN 5* 6  CREATININE 0.46 0.41*  CALCIUM 8.7* 8.4*    Intake/Output Summary (Last 24 hours) at 02/06/2018 0957 Last data filed at 02/05/2018 1752 Gross per 24 hour  Intake 490 ml  Output -  Net 490 ml     Physical Exam: Vital Signs Blood pressure (!) 146/88, pulse (!) 107, temperature 98.3 F (36.8 C), temperature source Oral, resp. rate 17, SpO2 99 %.   Constitutional: No distress . Vital signs reviewed. HEENT: EOMI, oral membranes moist Neck: supple Cardiovascular: RRR without murmur. No JVD    Respiratory: CTA Bilaterally without wheezes or rales. Normal effort    GI: BS +, non-tender, non-distended  Extremities: No clubbing, cyanosis, or edema Skin: No evidence of breakdown, no evidence of rash Neurologic: Hard of hearing.  Cranial nerves II through XII intact, motor strength is 5/5 in bilateral deltoid, bicep, tricep, grip, hip flexor, knee extensors, ankle dorsiflexor and plantar flexor Unremarkable sensory and cerebellar exam. Musculoskeletal: Full range of motion in all 4 extremities. No joint swelling   Assessment/Plan: 1. 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  Physiatrist is providing close team supervision and 24 hour management of active medical problems listed below.  Physiatrist and rehab team continue to assess barriers to discharge/monitor patient progress toward functional and medical  goals  Care Tool:  Bathing    Body parts bathed by patient: Right arm, Left arm, Chest, Abdomen, Front perineal area, Buttocks, Right upper leg, Left upper leg, Right lower leg, Left lower leg, Face         Bathing assist Assist Level: Minimal Assistance - Patient > 75%     Upper Body Dressing/Undressing Upper body dressing   What is the patient wearing?: Pull over shirt    Upper body assist Assist Level: Minimal Assistance - Patient > 75%    Lower Body Dressing/Undressing Lower body dressing      What is the patient wearing?: Pants     Lower body assist Assist for lower body dressing: Minimal Assistance - Patient > 75%     Toileting Toileting    Toileting assist Assist for toileting: Contact Guard/Touching assist     Transfers Chair/bed transfer  Transfers assist     Chair/bed transfer assist level: Contact Guard/Touching assist     Locomotion Ambulation   Ambulation assist      Assist level: Minimal Assistance - Patient > 75% Assistive device: (L foot-up) Max distance: 100 ft    Walk 10 feet activity   Assist     Assist level: Minimal Assistance - Patient > 75% Assistive device: (L foot-up)   Walk 50 feet activity   Assist    Assist level: Minimal Assistance - Patient > 75% Assistive device: (L foot-up)    Walk 150 feet activity   Assist    Assist level: Minimal Assistance - Patient > 75% Assistive device: Walker-rolling  Walk 10 feet on uneven surface  activity   Assist Walk 10 feet on uneven surfaces activity did not occur: Safety/medical concerns         Wheelchair     Assist Will patient use wheelchair at discharge?: No Type of Wheelchair: Manual    Wheelchair assist level: Minimal Assistance - Patient > 75% Max wheelchair distance: 100    Wheelchair 50 feet with 2 turns activity    Assist    Wheelchair 50 feet with 2 turns activity did not occur: Safety/medical concerns       Wheelchair 150  feet activity     Assist Wheelchair 150 feet activity did not occur: Safety/medical concerns        Medical Problem List and Plan: 1.  Left side weakness secondary to multifocal infarct involving the right thalamic/IC, left SCA territory, right semi-ovale complicated by recent streptococcal pneumonia bacteremia/meningitis 2 weeks prior with subsequent loss of hearing   --Continue CIR therapies including PT, OT, and SLP  .2.  DVT Prophylaxis/Anticoagulation: SCDs. 3. Pain Management: Tylenol as needed, if needed consider topamax 4. Mood:  Prozac 40 mg daily.Provide emotional support 5. Neuropsych: This patient is not capable of making decisions on her own behalf. 6. Skin/Wound Care:  Routine skin checks 7. Fluids/Electrolytes/Nutrition:  Routine in and out's with follow-up chemistries BUN/creat normal 1/17 8. ID. Continue vancomycin and ceftriaxone through 02/26/2018 per infectious disease. 9.Hypothyroidism. Synthroid 10. Hyperlipidemia. Lipitor  HypoK improving just added  supplement KCL 83meq BID on 1/16 11.  Hyponatremia- 130 1/17  -Serum osmolality 268, urine osmolality and sodium are normal  -Continue to monitor serially  -Recheck on Sunday LOS: 3 days A FACE TO FACE EVALUATION WAS PERFORMED  Meredith Staggers 02/06/2018, 9:57 AM

## 2018-02-06 NOTE — Progress Notes (Signed)
Occupational Therapy Session Note  Patient Details  Name: RENUKA FARFAN MRN: [id number] Date of Birth: 1980/04/06  Today's Date: 02/06/2018 OT Individual Time: 1110-1206 OT Individual Time Calculation (min): 56 min    Short Term Goals: Week 1:  OT Short Term Goal 1 (Week 1): STG = LTGs due to ELOS  Skilled Therapeutic Interventions/Progress Updates:    Treatment session with focus on ADL retraining and sequencing during self-care tasks.  Pt received supine in bed asleep.  Pt easily aroused to tactile stimulus.  Therapist encouraged pt to engage in bathing and dressing at sink side due to IV still connected and running.  Pt required increased time and encouragement to get OOB, even though she stated that she was agreeable pt maintained position in bed.  Completed bed mobility with min assist and ambulated to toilet without AD with HHA faded to pt managing IV pole while therapist provided CGA.  Pt completed toileting tasks with CGA. Returned to sink where pt required max cues for initiation and sequencing of bathing.  Pt would constantly look to therapist for direction to continue.  Pt completed dressing with setup assistance and cues for sequencing.  Pt requested to return to bed to eat lunch, however agreeable to staying up in recliner.  Ambulated to recliner with CGA while managing IV pole.  Pt with one LOB requiring mod assist to correct.  Pt left upright in recliner with seat belt alarm on and father and visitor arriving.  Therapy Documentation Precautions:  Precautions Precautions: Fall Precaution Comments: Deaf Restrictions Weight Bearing Restrictions: No Pain: Pain Assessment Pain Scale: 0-10 Pain Score: 0-No pain Faces Pain Scale: No hurt   Therapy/Group: Individual Therapy  Simonne Come 02/06/2018, 12:33 PM

## 2018-02-06 NOTE — Progress Notes (Signed)
Speech Language Pathology Daily Session Note  Patient Details  Name: Emma Martin MRN: [id number] Date of Birth: February 19, 1980  Today's Date: 02/06/2018 SLP Individual Time: 9842-1031 SLP Individual Time Calculation (min): 30 min  Short Term Goals: Week 1: SLP Short Term Goal 1 (Week 1): STG=LTG due to ELOS  Skilled Therapeutic Interventions:  Pt was seen for skilled ST targeting cognitive goals.  Pt was received in bed getting ready to eat breakfast.  Given that pt exhibits a preference to remain laying in bed, therapist encouraged pt to get to wheelchair to eat breakfast.  Pt took more than a reasonable amount of time to eat breakfast this morning despite therapist's cues to finish meal in a timely manner in order to be able to participate in therapy.  As a result, therapist was unable to begin session for the first 15 min.  Pt needed max cues to recall activities from yesterday's therapy session; however, she does continue verbalize fair intellectual awareness of deficits regarding cognitive "slowness" when asked.  Pt was left in wheelchair with call bell within reach and chair alarm set.  Continue per current plan of care.    Pain Pain Assessment Pain Scale: 0-10 Pain Score: 0-No pain Faces Pain Scale: No hurt  Therapy/Group: Individual Therapy  Shakeila Pfarr, Selinda Orion 02/06/2018, 12:32 PM

## 2018-02-07 ENCOUNTER — Inpatient Hospital Stay (HOSPITAL_COMMUNITY): Payer: Self-pay

## 2018-02-07 LAB — VANCOMYCIN, TROUGH: Vancomycin Tr: 16 ug/mL (ref 15–20)

## 2018-02-07 NOTE — Progress Notes (Signed)
[facility] PHYSICAL MEDICINE & REHABILITATION PROGRESS NOTE   Subjective/Complaints:  Patient in bed.  Incontinent of urine.  No other issues per nurse.  ROS: Limited due to cognitive/behavioral   Objective:   No results found. No results for input(s): WBC, HGB, HCT, PLT in the last 72 hours. Recent Labs    02/05/18 0354  NA 130*  K 3.1*  CL 92*  CO2 25  GLUCOSE 114*  BUN 6  CREATININE 0.41*  CALCIUM 8.4*    Intake/Output Summary (Last 24 hours) at 02/07/2018 0937 Last data filed at 02/07/2018 0900 Gross per 24 hour  Intake 920 ml  Output -  Net 920 ml     Physical Exam: Vital Signs Blood pressure (!) 152/91, pulse (!) 108, temperature 97.7 F (36.5 C), temperature source Oral, resp. rate 16, SpO2 100 %.   Constitutional: No distress . Vital signs reviewed. HEENT: EOMI, oral membranes moist Neck: supple Cardiovascular: RRR without murmur. No JVD    Respiratory: CTA Bilaterally without wheezes or rales. Normal effort    GI: BS +, non-tender, non-distended  Extremities: No clubbing, cyanosis, or edema Skin: No evidence of breakdown, no evidence of rash Neurologic: Hard of hearing.  Follows basic commands cranial nerves II through XII intact, motor strength is 5/5 in bilateral deltoid, bicep, tricep, grip, hip flexor, knee extensors, ankle dorsiflexor and plantar flexor Unremarkable sensory and cerebellar exam. Musculoskeletal: Full range of motion in all 4 extremities. No joint swelling   Assessment/Plan: 1. 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  Physiatrist is providing close team supervision and 24 hour management of active medical problems listed below.  Physiatrist and rehab team continue to assess barriers to discharge/monitor patient progress toward functional and medical goals  Care  Tool:  Bathing    Body parts bathed by patient: Right arm, Left arm, Chest, Abdomen, Front perineal area, Buttocks, Face         Bathing assist Assist Level: Contact Guard/Touching assist     Upper Body Dressing/Undressing Upper body dressing   What is the patient wearing?: Pull over shirt    Upper body assist Assist Level: Supervision/Verbal cueing    Lower Body Dressing/Undressing Lower body dressing      What is the patient wearing?: Pants     Lower body assist Assist for lower body dressing: Contact Guard/Touching assist     Toileting Toileting    Toileting assist Assist for toileting: Contact Guard/Touching assist     Transfers Chair/bed transfer  Transfers assist     Chair/bed transfer assist level: Supervision/Verbal cueing     Locomotion Ambulation   Ambulation assist      Assist level: Minimal Assistance - Patient > 75% Assistive device: (L foot-up) Max distance: 200 ft    Walk 10 feet activity   Assist     Assist level: Minimal Assistance - Patient > 75% Assistive device: (L foot-up)   Walk 50 feet activity   Assist    Assist level: Minimal Assistance - Patient > 75% Assistive device: (L foot-up)    Walk 150 feet activity   Assist    Assist level: Minimal Assistance - Patient > 75% Assistive device: Walker-rolling    Walk 10 feet on uneven surface  activity   Assist Walk 10 feet on uneven surfaces activity did not occur: Safety/medical concerns         Wheelchair  Assist Will patient use wheelchair at discharge?: No Type of Wheelchair: Manual    Wheelchair assist level: Minimal Assistance - Patient > 75% Max wheelchair distance: 100    Wheelchair 50 feet with 2 turns activity    Assist    Wheelchair 50 feet with 2 turns activity did not occur: Safety/medical concerns       Wheelchair 150 feet activity     Assist Wheelchair 150 feet activity did not occur: Safety/medical concerns         Medical Problem List and Plan: 1.  Left side weakness secondary to multifocal infarct involving the right thalamic/IC, left SCA territory, right semi-ovale complicated by recent streptococcal pneumonia bacteremia/meningitis 2 weeks prior with subsequent loss of hearing   --Continue CIR therapies including PT, OT, and SLP  .2.  DVT Prophylaxis/Anticoagulation: SCDs. 3. Pain Management: Tylenol as needed, if needed consider topamax 4. Mood:  Prozac 40 mg daily.Provide emotional support 5. Neuropsych: This patient is not capable of making decisions on her own behalf. 6. Skin/Wound Care:  Routine skin checks 7. Fluids/Electrolytes/Nutrition:  Routine in and out's with follow-up chemistries BUN/creat normal 1/17 8. ID. Continue vancomycin and ceftriaxone through 02/26/2018 per infectious disease.  -Trough level low today, dose of vancomycin being adjusted per pharmacy.  Appreciate their help 9.Hypothyroidism. Synthroid 10. Hyperlipidemia. Lipitor  HypoK improving just added  supplement KCL 19meq BID on 1/16 11.  Hyponatremia- 130 1/17  -Serum osmolality 268, urine osmolality and sodium are normal  -Continue to monitor serially  -Recheck labs on Monday LOS: 4 days A FACE TO FACE EVALUATION WAS PERFORMED  Meredith Staggers 02/07/2018, 9:37 AM

## 2018-02-07 NOTE — Progress Notes (Signed)
Pharmacy Antibiotic Note  Emma Martin is a 38 y.o. female admitted on 02/03/2018 with left sided weakness and found to have multiple acute strokes concern for embolic phenomenon possibly related to an infectious source. She completed a course of ceftriaxone and vancomycin on 01/29/2018 for S. pneumoniae bacteremia and meningitis. 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   Vancomycin trough this evening now within goal range of 15-59mcg/mL at 68mcg/mL after several dose increases. Trough drawn ~5 hrs after dose given, so should represent close to true trough. Scr remains low stable ~0.4.   Plan: Continue vancomycin 1750mg  IV q6h  Continue ceftriaxone 2g IV q12h Continue both antibiotics through 02/26/18  Temp (24hrs), Avg:98.2 F (36.8 C), Min:97.7 F (36.5 C), Max:99 F (37.2 C)  Recent Labs  Lab 02/02/18 0441 02/03/18 0843  02/04/18 0732  02/05/18 0354  02/06/18 1744 02/07/18 1746  WBC  --  7.5  --  6.0  --   --   --   --   --   CREATININE 0.48 0.40*  --  0.46  --  0.41*  --   --   --   VANCOTROUGH  --   --    < >  --    < >  --    < > 11* 16   < > = values in this interval not displayed.    Estimated Creatinine Clearance: 73.4 mL/min (A) (by C-G formula based on SCr of 0.41 mg/dL (L)).    Allergies  Allergen Reactions  . Benzoin Rash   Antimicrobials this admission: Vanc 1/11 >> Rocephin 1/11 >>  Dose adjustments this admission: 02/01/2018 VT 11 >> dose increased to 1250mg  IV q8h 02/03/2018 VT 8 >>>dose increased to 750mg  IV q6h 02/04/2018 VT 8 >>> dose increased to 1000mg  IV q6h 02/05/2018 VT 10 >> dose increased to 1250mg  IV q6h 02/06/2018 VT 11 >> dose increased to 1750mg  IV q6h 02/07/2018 VT 16 >> continue 1750mg  IV q6h  Microbiology results: 1/11 BCx: NGTD 1/11 MRSA PCR: negative  Thank you for allowing pharmacy to be a part of this patient's care.  Mila Merry Gerarda Fraction, PharmD, [place] PGY2 Infectious Diseases  Pharmacy Resident Phone: [phone] 02/07/18   6:42 PM

## 2018-02-07 NOTE — Progress Notes (Addendum)
Physical Therapy Session Note  Patient Details  Name: Emma Martin MRN: [id number] Date of Birth: 1980-01-28  Today's Date: 02/07/2018 PT Individual Time: 4580-9983 PT Individual Time Calculation (min): 65 min   Short Term Goals: Week 1:  PT Short Term Goal 1 (Week 1): STG=LTG due to ELOS  Skilled Therapeutic Interventions/Progress Updates:   Pt resting in fully reclined recliner,, iwht family present.  She sat up with tactile cues as PT raised back and lower LEs.  Stand pivot recliner> w/c to R iwht min assist.  Kinetron in standing x 20 cycles x 3 for alternating reciprocal movement, wt shifting.  Gait training on level tile with written and visual cues , on level tile with RW, x 40' around large tables and chairs in dayroom, min guard asisst. L inattention resulted in pt bumping obstacles on L.  Obstacle course for advancd gait, weaving in/out of 4 chairs x2.  Pt bumped into 4/8 chairs and replied afterwards that she had not bumped into any of them.. Pt sat suddenly x 2 as she fatigued.  She was unable to remember task, requiring max multimodal cues to finish and return to her w/c.   Standing activity, reaching with L hand within BOS to retrieve a small object, then rotate to give it to her father on her R, x 12.  Pt demonstrates LOB backwards and to L when reaching L.  She was unwilling to reach out of BOS to L.   Pt transferred back to recliner stand pivot with CG assist. PT educated family about encouraging pt to stay sitting up, reclined slightly, positioned with pillows behind back and bil LEs elevated, instead of lying down as often.  Seat belt alarm set and needs left at hand; family present.     Therapy Documentation Precautions:  Precautions Precautions: Fall Precaution Comments: Deaf Restrictions Weight Bearing Restrictions: No  Pain: 2/10 low back; positioned with pillows for back support at end of session        Therapy/Group: Individual  Therapy  Humphrey Guerreiro 02/07/2018, 4:42 PM

## 2018-02-08 ENCOUNTER — Inpatient Hospital Stay (HOSPITAL_COMMUNITY): Payer: Self-pay | Admitting: Physical Therapy

## 2018-02-08 ENCOUNTER — Inpatient Hospital Stay (HOSPITAL_COMMUNITY): Payer: Self-pay | Admitting: Speech Pathology

## 2018-02-08 ENCOUNTER — Inpatient Hospital Stay (HOSPITAL_COMMUNITY): Payer: Self-pay | Admitting: Occupational Therapy

## 2018-02-08 ENCOUNTER — Inpatient Hospital Stay (HOSPITAL_COMMUNITY): Payer: Medicaid Other

## 2018-02-08 DIAGNOSIS — G9341 Metabolic encephalopathy: Secondary | ICD-10-CM

## 2018-02-08 LAB — CBC WITH DIFFERENTIAL/PLATELET
Band Neutrophils: 0 %
Basophils Absolute: 0 10*3/uL (ref 0.0–0.1)
Basophils Relative: 1 %
Blasts: 0 %
Eosinophils Absolute: 0.1 10*3/uL (ref 0.0–0.5)
Eosinophils Relative: 3 %
HCT: 37.3 % (ref 36.0–46.0)
Hemoglobin: 12.2 g/dL (ref 12.0–15.0)
Lymphocytes Relative: 25 %
Lymphs Abs: 0.5 10*3/uL — ABNORMAL LOW (ref 0.7–4.0)
MCH: 27.5 pg (ref 26.0–34.0)
MCHC: 32.7 g/dL (ref 30.0–36.0)
MCV: 84.2 fL (ref 80.0–100.0)
MONOS PCT: 25 %
Metamyelocytes Relative: 0 %
Monocytes Absolute: 0.5 10*3/uL (ref 0.1–1.0)
Myelocytes: 0 %
NEUTROS ABS: 0.9 10*3/uL — AB (ref 1.7–7.7)
Neutrophils Relative %: 45 %
Other: 1 %
Platelets: 205 10*3/uL (ref 150–400)
Promyelocytes Relative: 0 %
RBC: 4.43 MIL/uL (ref 3.87–5.11)
RDW: 13.7 % (ref 11.5–15.5)
WBC: 1.9 10*3/uL — ABNORMAL LOW (ref 4.0–10.5)
nRBC: 0 % (ref 0.0–0.2)
nRBC: 0 /100 WBC

## 2018-02-08 LAB — GLUCOSE, CAPILLARY
Glucose-Capillary: 105 mg/dL — ABNORMAL HIGH (ref 70–99)
Glucose-Capillary: 96 mg/dL (ref 70–99)

## 2018-02-08 LAB — BASIC METABOLIC PANEL
ANION GAP: 11 (ref 5–15)
Anion gap: 10 (ref 5–15)
BUN: 5 mg/dL — ABNORMAL LOW (ref 6–20)
BUN: 5 mg/dL — ABNORMAL LOW (ref 6–20)
CALCIUM: 8.2 mg/dL — AB (ref 8.9–10.3)
CO2: 26 mmol/L (ref 22–32)
CO2: 27 mmol/L (ref 22–32)
Calcium: 7.8 mg/dL — ABNORMAL LOW (ref 8.9–10.3)
Chloride: 94 mmol/L — ABNORMAL LOW (ref 98–111)
Chloride: 98 mmol/L (ref 98–111)
Creatinine, Ser: <0.3 mg/dL — ABNORMAL LOW (ref 0.44–1.00)
Creatinine, Ser: <0.3 mg/dL — ABNORMAL LOW (ref 0.44–1.00)
Glucose, Bld: 105 mg/dL — ABNORMAL HIGH (ref 70–99)
Glucose, Bld: 102 mg/dL — ABNORMAL HIGH (ref 70–99)
Potassium: 2.8 mmol/L — ABNORMAL LOW (ref 3.5–5.1)
Potassium: 2.7 mmol/L — CL (ref 3.5–5.1)
Sodium: 131 mmol/L — ABNORMAL LOW (ref 135–145)
Sodium: 135 mmol/L (ref 135–145)

## 2018-02-08 LAB — OSMOLALITY, URINE: Osmolality, Ur: 258 mOsm/kg — ABNORMAL LOW (ref 300–900)

## 2018-02-08 MED ORDER — MUSCLE RUB 10-15 % EX CREA
TOPICAL_CREAM | CUTANEOUS | Status: DC | PRN
  Filled 2018-02-08: qty 85

## 2018-02-08 MED ORDER — POTASSIUM CHLORIDE 10 MEQ/100ML IV SOLN
10.0000 meq | INTRAVENOUS | Status: AC
  Administered 2018-02-08 – 2018-02-09 (×3): 10 meq via INTRAVENOUS
  Filled 2018-02-08 (×2): qty 100

## 2018-02-08 MED ORDER — POTASSIUM CHLORIDE CRYS ER 20 MEQ PO TBCR
40.0000 meq | EXTENDED_RELEASE_TABLET | Freq: Two times a day (BID) | ORAL | Status: DC
  Administered 2018-02-09 – 2018-02-11 (×6): 40 meq via ORAL
  Filled 2018-02-08 (×7): qty 2

## 2018-02-08 NOTE — Progress Notes (Signed)
Occupational Therapy Session Note  Patient Details  Name: Emma Martin MRN: [id number] Date of Birth: Jul 20, 1980  Today's Date: 02/08/2018 OT Individual Time: 1300-1350 OT Individual Time Calculation (min): 50 min    Short Term Goals: Week 1:  OT Short Term Goal 1 (Week 1): STG = LTGs due to ELOS  Skilled Therapeutic Interventions/Progress Updates:    Patient asleep in bed upon arrival, Dad present.  She is agreeable to attend therapy session.  Functional transfers to/from toilet, bed, w/c with CG/min A.  Incontinent of urine that soiled clothing - required mod A to change into clean pants and brief.  Completed standing endurance and balance activities with CG A, Completed design copy tasks with increased time to complete.  She returned to bed at close of session, call bell in reach and bed alarm set.  Reviewed functional status and goals with father who is in agreement    Therapy Documentation Precautions:  Precautions Precautions: Fall Precaution Comments: Deaf Restrictions Weight Bearing Restrictions: No General:   Vital Signs: Therapy Vitals Pulse Rate: 95 Resp: 16 BP: (!) 149/86 Patient Position (if appropriate): Lying Oxygen Therapy SpO2: 97 % O2 Device: Room Air Pain: Pain Assessment Pain Scale: 0-10 Pain Score: 0-No pain Pain Location: Head Other Treatments:     Therapy/Group: Individual Therapy  Carlos Levering 02/08/2018, 3:50 PM

## 2018-02-08 NOTE — Progress Notes (Signed)
Speech Language Pathology Daily Session Note  Patient Details  Name: LACHLYN VANDERSTELT MRN: [id number] Date of Birth: [birth year]  Today's Date: 02/08/2018 SLP Individual Time: 0830-0900 SLP Individual Time Calculation (min): 30 min  Short Term Goals: Week 1: SLP Short Term Goal 1 (Week 1): STG=LTG due to ELOS  Skilled Therapeutic Interventions:  Skilled treatment session focused on cognition goals. SLP facilitated session by providing Max A multimodal cues to mean upright in bed instead of leaning over to her right side. Pt able to identify matching items on cards with Mod I and was able to maintain attention to task of dealing 2 piles of cards and playing novel card game. Pt left upright in bed and was handed off to OT. Continue per current plan of care.      Pain Pain Assessment Pain Scale: 0-10 Pain Score: 0-No pain  Therapy/Group: Individual Therapy  Phil Corti 02/08/2018, 12:12 PM

## 2018-02-08 NOTE — Progress Notes (Signed)
Occupational Therapy Session Note  Patient Details  Name: Emma Martin MRN: [id number] Date of Birth: 04-19-1980  Today's Date: 02/08/2018 OT Individual Time: 0900-1000 OT Individual Time Calculation (min): 60 min    Short Term Goals: Week 1:  OT Short Term Goal 1 (Week 1): STG = LTGs due to ELOS  Skilled Therapeutic Interventions/Progress Updates:    Patient in bed with speech therapy upon arrival.  She is pleasant and cooperative.  Patient able to follow basic written directions.  She does not initiate tasks on her own.  She was incontinent of a large amount of urine requiring bed linens to be changed and bed cleaned that she seemed to be unaware of.  When pointed out she stated "I am not surprised".   SPT to/from bed, w/c, toilet with min A.  Hygiene with modA for thoroughness, LB dressing min A, grooming set up assist.    Completed seated and standing balance/upper body ROM activities- hand over hand cues to complete task as directed, min A to maintain stance with dynamic activity.  Completed [place] and [place] activities with min/moderate difficulty with left hand - ongoing cues to use L UE for task completion.    PT session following OT today  Therapy Documentation Precautions:  Precautions Precautions: Fall Precaution Comments: Deaf Restrictions Weight Bearing Restrictions: No General:   Vital Signs:  Pain: Pain Assessment Pain Scale: 0-10 Pain Score: 2  Pain Location: Head Other Treatments:     Therapy/Group: Individual Therapy  Carlos Levering 02/08/2018, 12:35 PM

## 2018-02-08 NOTE — Progress Notes (Signed)
Pt in radiology for ct scan

## 2018-02-08 NOTE — Progress Notes (Addendum)
Placed a call to Dr. Aviva Kluver regarding Ms. Weisinger mental changes, and CT results. He will come to assess Ms. Mangini.  Stat lab work was ordered earlier CBC and BMP and EKG.  K+ 2.7 two runs of Potassium ordered, check lab work post runs. The above spoke with RN and Camera operator.  Call was placed to Laboratory to verify WBC, WBC ran manually per [place] technician 1.96. Will order CBC with Diff in the Morning, will continue to monitor.

## 2018-02-08 NOTE — Progress Notes (Signed)
At 1745 this writer went into patients room to administer  medication. Patient was difficult to arouse by rubbing hand. Patient drowsy at her baseline. Patient was alert enough to take linsipril. Patient set up for dinner. Father in room. Call bell within reach.  At 6pm rounded on patients and noted patient to be sleeping. Only one bite noted from the  dinner tray. Assessed oral for food none noted. Attempt to stimulate patient by rubbing hand no response and sternal rub. noted red rash localized  to upper chest area. Patient remained unresponsive. Patient also noted cool and clammy to touch.See vitals in chart.  Charge Rn called to assess patient. Rapid response called. NP Zella Ball informed of patient status change. Order received. Charge rn accompanied patient to CT scan. Results no change in ct from previous scan.  Zella Ball informed of results no new orders at this time. Report given to on coming nurse. Continue with plan of care.

## 2018-02-08 NOTE — Progress Notes (Signed)
Physical Therapy Session Note  Patient Details  Name: AZANI BROGDON MRN: [id number] Date of Birth: 28-Oct-1980  Today's Date: 02/08/2018 PT Individual Time: 1003-1103 PT Individual Time Calculation (min): 60 min   Short Term Goals: Week 1:  PT Short Term Goal 1 (Week 1): STG=LTG due to ELOS  Skilled Therapeutic Interventions/Progress Updates:   Pt received in w/c from OT. Pt removes shirt and dons clean shirt with assistance from therapist to lift sleeve over PIC line on R arm. Pt wheeled around unit for time management. Pt transfers from w/c<>mat with close supervision and sit<>stand with close supervision. Pt stands with toes on wedge for passive heel cord stretch and matches playing cards on board on L side with focus on LUE use and L attention. Pt requires breaks to lay back multiple times during session and therapist oriented pt to situation and deficits during session due to pt's impaired intellectual awareness. Pt ambulates with rollator and then with RW with supervision>CGA for turns and L inattention. Pt states she likes walking with RW but is unaware of hitting things on the L side despite ongoing education. Pt ambulates to the stairs and ascends/descends 4 stairs with CGA and 2 handrails with tactile cuing to turn around at the top of the staircase. Pt voids bladder in brief and wheeled back to room to go to the bathroom. Pt transfers w/c<>toilet with CGA and able to void bladder again, requiring assistance to change brief and pull up pants. Pt washes hands independently. Pt left in w/c with chair alarm donned and father present to supervise with all needs in reach.  Pt with c/o headache and states that lying down provided with wedge helps.  Therapy Documentation Precautions:  Precautions Precautions: Fall Precaution Comments: Deaf Restrictions Weight Bearing Restrictions: No   Therapy/Group: Individual Therapy  Shelia Kingsberry 02/08/2018, 4:02 PM

## 2018-02-08 NOTE — Consult Note (Signed)
Requesting Physician: Danella Sensing ARNP    Chief Complaint: Lethargic   History obtained from: Patient and Chart     HPI:                                                                                                                                       Emma Martin is an 38 y.o. 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  Neurology was reconsulted because patient was lethargic around 7 PM this evening.  According to the nurse, the patient became very somnolent and was unable to arouse.  She apparently feels clammy and rectal temperature was 96.6.-CT head was performed which showed evolving subacute infarcts in the territories as noted on the MRI performed 1 week earlier.  No twitching, lip smacking movements or posturing noted.  Nurse stated night prior to this also she was very somnolent around 7 but this time it was much worse   Night coverage nurse practitioner was notified who called neurology for evaluation.     Past Medical History:  Diagnosis Date  . Bicuspid aortic valve    , mild aortic insufficiency. (No SBE prophylaxis needed)  . Complication of anesthesia   . Dyslipidemia   . Generalized anxiety disorder    , with obsessive-compulsive traits.  . Hypertension   . Hypothyroidism   . Mixed gonadal dysgenesis    , (additional Y-bearing cell line) for which she reportedly underwent bilateral gonadectomy (patient unaware; needs confirmation) due to malignancy potential.  . PONV (postoperative nausea and vomiting)   . Primary amenorrhea    , treated starting at age 72 or 78 with estrogen and progesterone  . Scoliosis    , treated with Harrington rods (1997) for stabilization.  .  Seasonal allergic rhinitis   . Short stature    , previously treated with growth hormone age 69-15 years.  Radford Pax syndrome    , previously followed by pediatric endocrinologist Freddy Jaksch, MD at [facility] through her early 38s).    Past Surgical History:  Procedure Laterality Date  . TEE WITHOUT CARDIOVERSION N/A 01/19/2018   Procedure: TRANSESOPHAGEAL ECHOCARDIOGRAM (TEE);  Surgeon: Jerline Pain, MD;  Location: [facility] ENDOSCOPY;  Service: Cardiovascular;  Laterality: N/A;  . TEE WITHOUT CARDIOVERSION N/A 02/02/2018   Procedure: TRANSESOPHAGEAL ECHOCARDIOGRAM (TEE);  Surgeon: Elouise Munroe, MD;  Location: [place];  Service: Cardiology;  Laterality: N/A;    Family History  Problem Relation Age of Onset  . Other Father        A + W  . Cancer Maternal Grandmother   . Cancer Paternal Grandfather   . Hypertension Mother   . Other Mother  Dyslipidemia  . Hypothyroidism Mother   . Mitral valve prolapse Mother    Social History:  reports that she has never smoked. She has never used smokeless tobacco. She reports current alcohol use of about 1.0 - 2.0 standard drinks of alcohol per week. She reports that she does not use drugs.  Allergies:  Allergies  Allergen Reactions  . Benzoin Rash    Medications:                                                                                                                        I reviewed home medications   ROS:                                                                                                                                     14 systems reviewed and negative except above    Examination:                                                                                                      General: Appears well-developed and well-nourished.  Psych: Affect appropriate to situation Eyes: No scleral injection HENT: No OP obstrucion Head: Normocephalic.  Cardiovascular: Normal rate and regular rhythm.   Respiratory: Effort normal and breath sounds normal to anterior ascultation GI: Soft.  No distension. There is no tenderness.  Skin: WDI    Neurological Examination Mental Status: Alert, oriented, thought content appropriate.  Speech fluent without evidence of aphasia.  Unable to hear,  however would ask questions tthrough  Writing. Cranial Nerves: II: Visual fields grossly normal,  III,IV, VI: ptosis not present, extra-ocular motions intact bilaterally, pupils equal, round, reactive to light and accommodation V,VII: smile symmetric, facial light touch sensation normal bilaterally VIII: Bilateral sensorineural hearing loss IX,X: uvula rises symmetrically XI: bilateral shoulder shrug XII: midline tongue extension Motor: Right : Upper extremity   5/5    Left:     Upper extremity   5/5  Lower extremity   5/5  Lower extremity   5/5 Tone and bulk:normal tone throughout; no atrophy noted Sensory: Pinprick and light touch intact throughout, bilaterally Deep Tendon Reflexes: 2+ and symmetric throughout Plantars: Right: downgoing   Left: downgoing Cerebellar: normal finger-to-nose, normal rapid alternating movements and normal heel-to-shin test Gait: normal gait and station     Lab Results: Basic Metabolic Panel: Recent Labs  Lab 02/03/18 0843 02/04/18 0732 02/05/18 0354 02/08/18 0354 02/08/18 2102 02/09/18 0341  NA 133* 134* 130* 131* 135 130*  K 3.6 2.9* 3.1* 2.8* 2.7* 2.5*  CL 99 94* 92* 94* 98 93*  CO2 24 28 25 26 27 26   GLUCOSE 98 95 114* 105* 102* 100*  BUN 6 5* 6 5* 5* <5*  CREATININE 0.40* 0.46 0.41* <0.30* <0.30* 0.41*  CALCIUM 8.3* 8.7* 8.4* 7.8* 8.2* 8.0*  MG 1.7  --   --   --   --   --   PHOS 2.6  --   --   --   --   --     CBC: Recent Labs  Lab 02/03/18 0843 02/04/18 0732 02/08/18 2102 02/09/18 0341  WBC 7.5 6.0 1.9* 2.8*  NEUTROABS 5.8 4.3 0.9* 1.8  HGB 12.2 12.2 12.2 12.0  HCT 36.2 37.6 37.3 36.6  MCV 86.6 85.5 84.2 83.2  PLT 258 259 205 215     Coagulation Studies: No results for input(s): LABPROT, INR in the last 72 hours.  Imaging: Ct Head Wo Contrast  Result Date: 02/08/2018 CLINICAL DATA:  Stroke, follow-up, loss of consciousness, history hypertension, Turner's syndrome EXAM: CT HEAD WITHOUT CONTRAST TECHNIQUE: Contiguous axial images were obtained from the base of the skull through the vertex without intravenous contrast. Sagittal and coronal MPR images reconstructed from axial data set. COMPARISON:  01/30/2018 CT head, MR brain 01/30/2018 FINDINGS: Brain: Normal ventricular morphology. No midline shift or mass effect. Evolving infarct at the genu of RIGHT corpus callosum and anterior RIGHT thalamus. No intracranial hemorrhage or mass lesion. Small white matter infarct posterior RIGHT frontal region corresponding to diffusion abnormality on MR. Questionable small focus of petechial gyriform hemorrhage RIGHT frontal lobe image 22. Vascular: No definite hyperdense vessels Skull: Intact Sinuses/Orbits: Partial opacification of LEFT sphenoid sinus, scattered ethmoid air cells, LEFT maxillary sinus Other: N/A IMPRESSION: Evolving infarct at the genu of the RIGHT corpus callosum and anterior RIGHT thalamus as well as a small white matter infarct at the posterior RIGHT frontal deep white matter. Questionable small focus of petechial gyriform hemorrhage versus artifact at RIGHT frontal lobe. Sinus disease changes as above. Electronically Signed   By: Lavonia Dana M.D.   On: 02/08/2018 19:23     I have reviewed the above imaging: CT head   ASSESSMENT AND PLAN  38 y.o. 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  By assessment patient seem sleepy but  easily arousable and follows commands.   Acute metabolic encephalopathy Hyponatremia and  Hypokalemia  Recommendations Correct electrolyte abnormalities Routine EEG Frequent neurochecks  Neurology will follow up on EEG.  If negative no further recommendations at this point please call back continues to have fluctuating mental status changes despite correction of electrolytes.  Jayliani Wanner [facility] Pager Number [phone]

## 2018-02-08 NOTE — Plan of Care (Signed)
  Problem: Consults Goal: RH STROKE PATIENT EDUCATION Description See Patient Education module for education specifics  Outcome: Progressing   Problem: RH BOWEL ELIMINATION Goal: RH STG MANAGE BOWEL WITH ASSISTANCE Description STG Manage Bowel with Mod.Assistance.  Outcome: Progressing   Problem: RH BLADDER ELIMINATION Goal: RH STG MANAGE BLADDER WITH ASSISTANCE Description STG Manage Bladder With Mod.Assistance  Outcome: Progressing   Problem: RH SKIN INTEGRITY Goal: RH STG SKIN FREE OF INFECTION/BREAKDOWN Description With Mod. assist  Outcome: Progressing Goal: RH STG MAINTAIN SKIN INTEGRITY WITH ASSISTANCE Description STG Maintain Skin Integrity With Mod Assistance.  Outcome: Progressing   Problem: RH SAFETY Goal: RH STG ADHERE TO SAFETY PRECAUTIONS W/ASSISTANCE/DEVICE Description STG Adhere to Safety Precautions With Mod. Assistance/Device.  Outcome: Progressing   Problem: RH PAIN MANAGEMENT Goal: RH STG PAIN MANAGED AT OR BELOW PT'S PAIN GOAL Description Less tha 3.  Outcome: Progressing

## 2018-02-08 NOTE — Progress Notes (Signed)
Called to bedside to assist with patient with change in LOC.  On arrival patient is in CT scan with RN staff from [facility].  Spoke with LPN [place] who was taking care of patient.  She reports around 1745 patient was difficult to arouse - but was able to take her po meds.  Several minutes later LPN reports patient unable to be aroused - cold and clammy - vital signs stable - CBG 96.  I was alerted at [place].  Spoke with father who was at the bedside.  After CT patient back to room - cold and clammy - pink - patient is deaf - arouses to pinch - opens eyes - smiles - can read to follow commands but quickly falls back to sleep.  Resps reg and unlabored - bil BS = clear - heart tones regular - rectal temp done 96.6.  Chewing motion of mouth noted. Was incontinent of urine - but not new for her.   On arousal smiles without droop.  Moving all 4's.  No focal deficit noted.  Staff talking with NP Zella Ball who will write orders. Vital signs stable - see flowsheet for details.   To call as needed.

## 2018-02-08 NOTE — Progress Notes (Signed)
[place] PHYSICAL MEDICINE & REHABILITATION PROGRESS NOTE   Subjective/Complaints:  No breathing problems , no bowel or bladder problems (using sketch pad for written communication   ROS: Limited due to cognitive/behavioral   Objective:   No results found. No results for input(s): WBC, HGB, HCT, PLT in the last 72 hours. Recent Labs    02/08/18 0354  NA 131*  K 2.8*  CL 94*  CO2 26  GLUCOSE 105*  BUN 5*  CREATININE <0.30*  CALCIUM 7.8*    Intake/Output Summary (Last 24 hours) at 02/08/2018 0842 Last data filed at 02/08/2018 0655 Gross per 24 hour  Intake 800 ml  Output 200 ml  Net 600 ml     Physical Exam: Vital Signs Blood pressure (!) 151/95, pulse (!) 101, temperature 98 F (36.7 C), temperature source Oral, resp. rate 18, SpO2 98 %.   Constitutional: No distress . Vital signs reviewed. HEENT: EOMI, oral membranes moist Neck: supple Cardiovascular: RRR without murmur. No JVD    Respiratory: CTA Bilaterally without wheezes or rales. Normal effort    GI: BS +, non-tender, non-distended  Extremities: No clubbing, cyanosis, or edema Skin: No evidence of breakdown, no evidence of rash Neurologic: Hard of hearing.  Follows basic commands cranial nerves II through XII intact, motor strength is 5/5 in bilateral deltoid, bicep, tricep, grip, hip flexor, knee extensors, ankle dorsiflexor and plantar flexor Unremarkable sensory and cerebellar exam. Musculoskeletal: Full range of motion in all 4 extremities. No joint swelling   Assessment/Plan: 1. 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  Physiatrist is providing close team supervision and 24 hour management of active medical problems listed below.  Physiatrist and rehab team continue to assess barriers to discharge/monitor patient progress toward  functional and medical goals  Care Tool:  Bathing    Body parts bathed by patient: Right arm, Left arm, Chest, Abdomen, Front perineal area, Buttocks, Face         Bathing assist Assist Level: Contact Guard/Touching assist     Upper Body Dressing/Undressing Upper body dressing   What is the patient wearing?: Pull over shirt    Upper body assist Assist Level: Supervision/Verbal cueing    Lower Body Dressing/Undressing Lower body dressing      What is the patient wearing?: Pants     Lower body assist Assist for lower body dressing: Contact Guard/Touching assist     Toileting Toileting    Toileting assist Assist for toileting: Contact Guard/Touching assist     Transfers Chair/bed transfer  Transfers assist     Chair/bed transfer assist level: Contact Guard/Touching assist     Locomotion Ambulation   Ambulation assist      Assist level: Minimal Assistance - Patient > 75% Assistive device: Walker-rolling Max distance: 70   Walk 10 feet activity   Assist     Assist level: Minimal Assistance - Patient > 75% Assistive device: Walker-rolling   Walk 50 feet activity   Assist    Assist level: Minimal Assistance - Patient > 75% Assistive device: Walker-rolling    Walk 150 feet activity   Assist    Assist level: Minimal Assistance - Patient > 75% Assistive device: Walker-rolling    Walk 10 feet on uneven surface  activity   Assist Walk 10 feet on uneven surfaces activity did not occur: Safety/medical concerns         Wheelchair  Assist Will patient use wheelchair at discharge?: No Type of Wheelchair: Manual    Wheelchair assist level: Minimal Assistance - Patient > 75% Max wheelchair distance: 100    Wheelchair 50 feet with 2 turns activity    Assist    Wheelchair 50 feet with 2 turns activity did not occur: Safety/medical concerns       Wheelchair 150 feet activity     Assist Wheelchair 150 feet activity  did not occur: Safety/medical concerns        Medical Problem List and Plan: 1.  Left side weakness secondary to multifocal infarct involving the right thalamic/IC, left SCA territory, right semi-ovale complicated by recent streptococcal pneumonia bacteremia/meningitis 2 weeks prior with subsequent loss of hearing   --Continue CIR therapies including PT, OT, and SLP  .2.  DVT Prophylaxis/Anticoagulation: SCDs. 3. Pain Management: Tylenol as needed, if needed consider topamax for HA but now mainly c/o neck and back pain, order K pad and sports cream 4. Mood:  Prozac 40 mg daily.Provide emotional support 5. Neuropsych: This patient is not capable of making decisions on her own behalf. 6. Skin/Wound Care:  Routine skin checks 7. Fluids/Electrolytes/Nutrition:  Routine in and out's with follow-up chemistries BUN/creat normal 1/17 8. ID. Continue vancomycin and ceftriaxone through 02/26/2018 per infectious disease.  -Trough level low today, dose of vancomycin being adjusted per pharmacy.  Appreciate their help 9.Hypothyroidism. Synthroid 10. Hyperlipidemia. Lipitor  HypoK improving just added  supplement KCL 67meq BID on 1/16, increase to 20meq BID, re check in am Several stools over the weekend may be contributing to Hypo K       11.  Hyponatremia- 130 1/17, stable 131 1/20  -Serum osmolality 268, urine osmolality and sodium are normal  -Continue to monitor serially  LOS: 5 days A FACE TO FACE EVALUATION WAS PERFORMED  Charlett Blake 02/08/2018, 8:42 AM

## 2018-02-09 ENCOUNTER — Inpatient Hospital Stay (HOSPITAL_COMMUNITY): Payer: Medicaid Other

## 2018-02-09 ENCOUNTER — Inpatient Hospital Stay (HOSPITAL_COMMUNITY): Payer: Self-pay | Admitting: Occupational Therapy

## 2018-02-09 ENCOUNTER — Inpatient Hospital Stay (HOSPITAL_COMMUNITY): Payer: Self-pay | Admitting: Physical Therapy

## 2018-02-09 ENCOUNTER — Inpatient Hospital Stay (HOSPITAL_COMMUNITY): Payer: Self-pay | Admitting: Speech Pathology

## 2018-02-09 ENCOUNTER — Inpatient Hospital Stay (HOSPITAL_COMMUNITY): Payer: Self-pay

## 2018-02-09 DIAGNOSIS — R5383 Other fatigue: Secondary | ICD-10-CM

## 2018-02-09 LAB — BASIC METABOLIC PANEL
ANION GAP: 10 (ref 5–15)
Anion gap: 11 (ref 5–15)
BUN: <5 mg/dL — ABNORMAL LOW (ref 6–20)
BUN: 5 mg/dL — ABNORMAL LOW (ref 6–20)
CO2: 26 mmol/L (ref 22–32)
CO2: 26 mmol/L (ref 22–32)
Calcium: 8 mg/dL — ABNORMAL LOW (ref 8.9–10.3)
Calcium: 7.9 mg/dL — ABNORMAL LOW (ref 8.9–10.3)
Chloride: 93 mmol/L — ABNORMAL LOW (ref 98–111)
Chloride: 94 mmol/L — ABNORMAL LOW (ref 98–111)
Creatinine, Ser: 0.41 mg/dL — ABNORMAL LOW (ref 0.44–1.00)
Creatinine, Ser: 0.37 mg/dL — ABNORMAL LOW (ref 0.44–1.00)
GFR calc Af Amer: >60 mL/min (ref >60)
GFR calc Af Amer: >60 mL/min (ref >60)
GFR calc non Af Amer: >60 mL/min (ref >60)
GFR calc non Af Amer: >60 mL/min (ref >60)
GLUCOSE: 100 mg/dL — AB (ref 70–99)
Glucose, Bld: 103 mg/dL — ABNORMAL HIGH (ref 70–99)
Potassium: 2.5 mmol/L — CL (ref 3.5–5.1)
Potassium: 3 mmol/L — ABNORMAL LOW (ref 3.5–5.1)
Sodium: 130 mmol/L — ABNORMAL LOW (ref 135–145)
Sodium: 130 mmol/L — ABNORMAL LOW (ref 135–145)

## 2018-02-09 LAB — MAGNESIUM: Magnesium: 1.5 mg/dL — ABNORMAL LOW (ref 1.7–2.4)

## 2018-02-09 LAB — VANCOMYCIN, RANDOM: Vancomycin Rm: 19

## 2018-02-09 LAB — CBC WITH DIFFERENTIAL/PLATELET
Abs Immature Granulocytes: 0.01 10*3/uL (ref 0.00–0.07)
Basophils Absolute: 0 10*3/uL (ref 0.0–0.1)
Basophils Relative: 0 %
EOS PCT: 3 %
Eosinophils Absolute: 0.1 10*3/uL (ref 0.0–0.5)
HEMATOCRIT: 36.6 % (ref 36.0–46.0)
Hemoglobin: 12 g/dL (ref 12.0–15.0)
Immature Granulocytes: 0 %
Lymphocytes Relative: 14 %
Lymphs Abs: 0.4 10*3/uL — ABNORMAL LOW (ref 0.7–4.0)
MCH: 27.3 pg (ref 26.0–34.0)
MCHC: 32.8 g/dL (ref 30.0–36.0)
MCV: 83.2 fL (ref 80.0–100.0)
Monocytes Absolute: 0.5 10*3/uL (ref 0.1–1.0)
Monocytes Relative: 17 %
NRBC: 0 % (ref 0.0–0.2)
Neutro Abs: 1.8 10*3/uL (ref 1.7–7.7)
Neutrophils Relative %: 66 %
Platelets: 215 10*3/uL (ref 150–400)
RBC: 4.4 MIL/uL (ref 3.87–5.11)
RDW: 13.7 % (ref 11.5–15.5)
WBC: 2.8 10*3/uL — ABNORMAL LOW (ref 4.0–10.5)

## 2018-02-09 LAB — VANCOMYCIN, TROUGH: Vancomycin Tr: 41 ug/mL (ref 15–20)

## 2018-02-09 MED ORDER — POTASSIUM CHLORIDE 10 MEQ/100ML IV SOLN
10.0000 meq | Freq: Once | INTRAVENOUS | Status: AC
  Administered 2018-02-09: 10 meq via INTRAVENOUS
  Filled 2018-02-09: qty 100

## 2018-02-09 MED ORDER — VANCOMYCIN HCL IN DEXTROSE 1-5 GM/200ML-% IV SOLN
1000.0000 mg | Freq: Four times a day (QID) | INTRAVENOUS | Status: DC
  Administered 2018-02-10 – 2018-02-12 (×11): 1000 mg via INTRAVENOUS
  Filled 2018-02-09 (×12): qty 200

## 2018-02-09 MED ORDER — POTASSIUM CHLORIDE 10 MEQ/100ML IV SOLN
10.0000 meq | INTRAVENOUS | Status: AC
  Administered 2018-02-09: 10 meq via INTRAVENOUS
  Filled 2018-02-09 (×2): qty 100

## 2018-02-09 NOTE — Progress Notes (Signed)
EEG complete - results pending 

## 2018-02-09 NOTE — Progress Notes (Signed)
On call provider was called & informed of critical lab, potassium level 2.5. Verbal order was given for another run of potassium 10 meq in 19ml NS. Order was verified & awaiting arrival from pharmacy.

## 2018-02-09 NOTE — Progress Notes (Signed)
[facility] PHYSICAL MEDICINE & REHABILITATION PROGRESS NOTE   Subjective/Complaints:  Appreciate neuro note, drowsy but arouses to Light touch (deaf).  Able to read note pad and answer questions Reviewed lab and CT scan results   ROS: Limited due to cognitive/behavioral   Objective:   Ct Head Wo Contrast  Result Date: 02/08/2018 CLINICAL DATA:  Stroke, follow-up, loss of consciousness, history hypertension, Turner's syndrome EXAM: CT HEAD WITHOUT CONTRAST TECHNIQUE: Contiguous axial images were obtained from the base of the skull through the vertex without intravenous contrast. Sagittal and coronal MPR images reconstructed from axial data set. COMPARISON:  01/30/2018 CT head, MR brain 01/30/2018 FINDINGS: Brain: Normal ventricular morphology. No midline shift or mass effect. Evolving infarct at the genu of RIGHT corpus callosum and anterior RIGHT thalamus. No intracranial hemorrhage or mass lesion. Small white matter infarct posterior RIGHT frontal region corresponding to diffusion abnormality on MR. Questionable small focus of petechial gyriform hemorrhage RIGHT frontal lobe image 22. Vascular: No definite hyperdense vessels Skull: Intact Sinuses/Orbits: Partial opacification of LEFT sphenoid sinus, scattered ethmoid air cells, LEFT maxillary sinus Other: N/A IMPRESSION: Evolving infarct at the genu of the RIGHT corpus callosum and anterior RIGHT thalamus as well as a small white matter infarct at the posterior RIGHT frontal deep white matter. Questionable small focus of petechial gyriform hemorrhage versus artifact at RIGHT frontal lobe. Sinus disease changes as above. Electronically Signed   By: Lavonia Dana M.D.   On: 02/08/2018 19:23   Recent Labs    02/08/18 2102 02/09/18 0341  WBC 1.9* 2.8*  HGB 12.2 12.0  HCT 37.3 36.6  PLT 205 215   Recent Labs    02/08/18 2102 02/09/18 0341  NA 135 130*  K 2.7* 2.5*  CL 98 93*  CO2 27 26  GLUCOSE 102* 100*  BUN 5* <5*  CREATININE <0.30*  0.41*  CALCIUM 8.2* 8.0*    Intake/Output Summary (Last 24 hours) at 02/09/2018 0831 Last data filed at 02/08/2018 1300 Gross per 24 hour  Intake 240 ml  Output -  Net 240 ml     Physical Exam: Vital Signs Blood pressure 140/85, pulse 96, temperature 98.5 F (36.9 C), temperature source Oral, resp. rate 16, SpO2 98 %.   Constitutional: No distress . Vital signs reviewed. HEENT: EOMI, oral membranes moist Neck: supple Cardiovascular: RRR without murmur. No JVD    Respiratory: CTA Bilaterally without wheezes or rales. Normal effort    GI: BS +, non-tender, non-distended  Extremities: No clubbing, cyanosis, or edema Skin: No evidence of breakdown, no evidence of rash Neurologic: Hard of hearing.  Follows basic commands cranial nerves II through XII intact, motor strength is 5/5 in bilateral deltoid, bicep, tricep, grip, hip flexor, knee extensors, ankle dorsiflexor and plantar flexor Unremarkable sensory and cerebellar exam. Musculoskeletal: Full range of motion in all 4 extremities. No joint swelling   Assessment/Plan: 1. 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  Physiatrist is providing close team supervision and 24 hour management of active medical problems listed below.  Physiatrist and rehab team continue to assess barriers to discharge/monitor patient progress toward functional and medical goals  Care Tool:  Bathing    Body parts bathed by patient: Right arm, Left arm, Chest, Abdomen, Front perineal area, Buttocks, Face         Bathing assist Assist Level: Contact Guard/Touching assist     Upper Body  Dressing/Undressing Upper body dressing   What is the patient wearing?: Pull over shirt    Upper body assist Assist Level: Supervision/Verbal cueing    Lower Body Dressing/Undressing Lower body  dressing      What is the patient wearing?: Underwear/pull up, Pants     Lower body assist Assist for lower body dressing: Minimal Assistance - Patient > 75%     Toileting Toileting    Toileting assist Assist for toileting: Moderate Assistance - Patient 50 - 74%     Transfers Chair/bed transfer  Transfers assist     Chair/bed transfer assist level: Supervision/Verbal cueing     Locomotion Ambulation   Ambulation assist      Assist level: Contact Guard/Touching assist(supervision>CGA/touching assist) Assistive device: Walker-rolling Max distance: 150 ft   Walk 10 feet activity   Assist     Assist level: Contact Guard/Touching assist Assistive device: Walker-rolling   Walk 50 feet activity   Assist    Assist level: Contact Guard/Touching assist Assistive device: Walker-rolling    Walk 150 feet activity   Assist    Assist level: Contact Guard/Touching assist Assistive device: Walker-rolling    Walk 10 feet on uneven surface  activity   Assist Walk 10 feet on uneven surfaces activity did not occur: Safety/medical concerns         Wheelchair     Assist Will patient use wheelchair at discharge?: No Type of Wheelchair: Manual    Wheelchair assist level: Minimal Assistance - Patient > 75% Max wheelchair distance: 100    Wheelchair 50 feet with 2 turns activity    Assist    Wheelchair 50 feet with 2 turns activity did not occur: Safety/medical concerns       Wheelchair 150 feet activity     Assist Wheelchair 150 feet activity did not occur: Safety/medical concerns        Medical Problem List and Plan: 1.  Left side weakness secondary to multifocal infarct involving the right thalamic/IC, left SCA territory, right semi-ovale complicated by recent streptococcal pneumonia bacteremia/meningitis 2 weeks prior with subsequent loss of hearing   --Continue CIR therapies including PT, OT, and SLP  Lethargy noted yesterday  was able to do therapy , no change in strength on exam, no new sedating meds , no seizures observed, await EEG If activity tolerance is poor today , would eval further for toxic metabolic causes, and can get IM consult .2.  DVT Prophylaxis/Anticoagulation: SCDs. 3. Pain Management: Tylenol as needed, if needed consider topamax for HA but now mainly c/o neck and back pain, order K pad and sports cream 4. Mood:  Prozac 40 mg daily.Provide emotional support 5. Neuropsych: This patient is not capable of making decisions on her own behalf. 6. Skin/Wound Care:  Routine skin checks 7. Fluids/Electrolytes/Nutrition:  Routine in and out's with follow-up chemistries BUN/creat normal 1/17 8. ID. Continue vancomycin and ceftriaxone through 02/26/2018 per infectious disease.  -Trough level low today, dose of vancomycin being adjusted per pharmacy.  Appreciate their help 9.Hypothyroidism. Synthroid 10. Hyperlipidemia. Lipitor  HypoK improving just added  supplement KCL 20meq BID on 1/16, increase to 36meq BID, KCL rider x 1 and will order 2 more then BMET.  Also check Serum Mag Several stools over the weekend may be contributing to Hypo K       11.  Hyponatremia- 130 1/17, stable 131 1/20  -Serum osmolality 268, urine osmolality and sodium are normal  -Continue to monitor serially  LOS: 6 days A FACE  TO FACE EVALUATION WAS PERFORMED  Charlett Blake 02/09/2018, 8:31 AM

## 2018-02-09 NOTE — Progress Notes (Addendum)
Physical Therapy Session Note  Patient Details  Name: Emma Martin MRN: [id number] Date of Birth: 24-Apr-1980  Today's Date: 02/09/2018 PT Individual Time: 1303-1350 PT Individual Time Calculation (min): 47 min   Short Term Goals: Week 1:  PT Short Term Goal 1 (Week 1): STG=LTG due to ELOS  Skilled Therapeutic Interventions/Progress Updates:  Pt verbally cleared for participation in bed level therapy by PA Linna Hoff). Pt received in bed with reports of posterior neck & HA pain & RN made aware; with questioning pt reports she does not feel "normal" but is unable to elaborate. While sitting upright in bed pt engaged in simple peg board design from pre selected pieces, correctly assembling design without cuing. Progressed to slightly complex design from choice of many with moderate cuing for error correction & problem solving. Pt performed BUE strengthening exercises with 3# weighted bar with demonstration and tactile cuing for technique; pt performs bicep curls, chest press, and tricep extension exercises. When attempting to instruct pt in bridging pt found to be incontinent and bed saturated in urine with pt unaware. Pt transfers to EOB with CGA and to recliner with CGA with assistance for doffing soiled brief, donning clean one, and changing into clean pants. At end of session pt left sitting in recliner with chair alarm donned & all needs in reach, father present in room.    Peg board activity also focused on use of LUE for attention to extremity & L environment.   Educated pt's father Psychiatrist) on recommendations of transport w/c for community mobility.   Pt frequently required cuing to keep HOB elevated vs laying it flat. Utilized written communication to communicate with pt during session.   Therapy Documentation Precautions:  Precautions Precautions: Fall Precaution Comments: Deaf Restrictions Weight Bearing Restrictions: No    Therapy/Group: Individual Therapy  [name] 02/09/2018, 2:34 PM

## 2018-02-09 NOTE — Procedures (Signed)
History: 38 yo F being evaluated for lethargy  Sedation: none  Technique: This is a 21 channel routine scalp EEG performed at the bedside with bipolar and monopolar montages arranged in accordance to the international 10/20 system of electrode placement. One channel was dedicated to EKG recording.    Background: The background is at least partially obscured throughout the recording by muscle artifact. Despite this, there is a clear posterior dominant rhythm of 12 Hz which is well-formed.  No clear epileptiform discharges were seen on this limited study.  Photic stimulation: Physiologic driving is not performed.  EEG Abnormalities: None  Clinical Interpretation: This EEG was limited by excessive muscle artifact.  There was no epileptiform activity seen, but if high concern for seizure remains, would consider a repeat exam.   There was no seizure or seizure predisposition recorded on this study. Please note that lack of epileptiform activity on EEG does not preclude the possibility of epilepsy.   Roland Rack, MD [facility] [phone]  If 7pm- 7am, please page neurology on call as listed in [place].

## 2018-02-09 NOTE — Progress Notes (Signed)
Lab called with critical vanc trough - 41. Notified Algis Liming, PA. Gave orders to hold 6pm vanc dose. PA also contacted pharmacy regarding levels.   Erie Noe, LPN

## 2018-02-09 NOTE — Progress Notes (Signed)
Pharmacy Antibiotic Note  Emma Martin is a 38 y.o. female admitted on 02/03/2018 with left sided weakness and found to have multiple acute strokes concern for embolic phenomenon possibly related to an infectious source. She completed a course of ceftriaxone and vancomycin on 01/29/2018 for S. pneumoniae bacteremia and meningitis. 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   Vancomycin trough this evening SUPRAtherapeutic (41 mcg/ml) on 1750mg  IV q6h. Random vanc level 4 hours later down to therapeutic (19 mcg/ml). Based on these 2 levels: ke 0.192 and half-life 3.6 hours. Previous 2 Vanc troughs (on different dosing)were checked only after 3 doses, vancomycin is probably accumulating with Q 6 hr dosing.  Scr remains low stable ~0.4.   Plan: Change Vancomycin back to 1gm IV q6h Continue ceftriaxone 2g IV q12h Continue both antibiotics through 02/26/18 Consider repeat vancomycin trough in 2-3 days to allow for accumulation time  Temp (24hrs), Avg:98.3 F (36.8 C), Min:98.1 F (36.7 C), Max:98.5 F (36.9 C)  Recent Labs  Lab 02/03/18 0843  02/04/18 0732  02/05/18 0354  02/07/18 1746 02/08/18 0354 02/08/18 2102 02/09/18 0341 02/09/18 1607 02/09/18 2011  WBC 7.5  --  6.0  --   --   --   --   --  1.9* 2.8*  --   --   CREATININE 0.40*  --  0.46  --  0.41*  --   --  <0.30* <0.30* 0.41* 0.37*  --   VANCOTROUGH  --    < >  --    < >  --    < > 16  --   --   --  41*  --   VANCORANDOM  --   --   --   --   --   --   --   --   --   --   --  19   < > = values in this interval not displayed.    Estimated Creatinine Clearance: 73.4 mL/min (A) (by C-G formula based on SCr of 0.37 mg/dL (L)).    Allergies  Allergen Reactions  . Benzoin Rash   Antimicrobials this admission: Vanc 1/11 >> Rocephin 1/11 >>  Dose adjustments this admission: 02/01/2018 VT 11 >> dose increased to 1250mg  IV q8h 02/03/2018 VT 8 >>>dose increased to 750mg   IV q6h 02/04/2018 VT 8 >>> dose increased to 1000mg  IV q6h 02/05/2018 VT 10 >> dose increased to 1250mg  IV q6h 02/06/2018 VT 11 >> dose increased to 1750mg  IV q6h 02/09/2018 VT 41>>dose decreased to 1000mg  IV q6h  Microbiology results: 1/11 BCx: Negative 1/11 MRSA PCR: negative  Thank you for allowing pharmacy to be a part of this patient's care.  Sherlon Handing, PharmD, BCPS Clinical pharmacist  **Pharmacist phone directory can now be found on [email or web address] ([email or web address]).  Listed under [place]. 02/09/18   11:38 PM

## 2018-02-09 NOTE — Progress Notes (Signed)
EEG being performed at bedside.   Erie Noe, LPN

## 2018-02-09 NOTE — Progress Notes (Signed)
Physical Therapy Note  Patient Details  Name: Emma Martin MRN: [id number] Date of Birth: 04-Aug-1980 Today's Date: 02/09/2018    Pt missed 30 minutes of skilled PT treatment 2/2 receiving EEG & father requesting time to complete paperwork with representative. Will f/u per POC.   [name] 02/09/2018, 12:16 PM

## 2018-02-09 NOTE — Progress Notes (Signed)
Occupational Therapy Session Note  Patient Details  Name: Emma Martin MRN: [id number] Date of Birth: 26-Feb-1980  Today's Date: 02/09/2018 OT Individual Time: 1898-4210 OT Individual Time Calculation (min): 60 min    Short Term Goals: Week 1:  OT Short Term Goal 1 (Week 1): STG = LTGs due to ELOS  Skilled Therapeutic Interventions/Progress Updates:    Treatment session with focus on ADL retraining and self-feeding.  Therapist spoke with PA Jeannene Patella Love) regarding potassium levels and decreased arousal with PA recommending bedside therapy until pt's arousal and levels improve.  Pt waking as therapist arrives, waving to therapist.  Pt required increased time to respond to questions seeming somewhat lethargic, but agreeable to eating breakfast.  Repositioned pt's bed to chair position to increase positioning to allow success with self-feeding.  Pt demonstrated good attention to task and ability to open containers.  As pt ate, she became more alert and engaged, her voice becoming stronger.  During bed mobility, noted pt's bed to be saturated.  Pt requested to ambulate to toilet.  Ambulated with RW with CGA to bathroom, pt completed toileting with CGA.  Pt continent of bowel and bladder on toilet (also incontinent of both prior to toileting).  Completed dressing from toilet with setup assist.  Returned to recliner to allow for changing of bed linens.  Pt left upright in recliner with LPN present to provide medications.  Pt's father arrived while pt toileting and pt pleased to see him and began asking about going home.  Educated on current therapy goals and medical concerns.  Pt nodding head in understanding, but still stating that she wants to go home.  Therapy Documentation Precautions:  Precautions Precautions: Fall Precaution Comments: Deaf Restrictions Weight Bearing Restrictions: No Pain:  Pt with no c/o pain   Therapy/Group: Individual Therapy  Simonne Come 02/09/2018, 3:30 PM

## 2018-02-09 NOTE — Progress Notes (Signed)
Speech Language Pathology Daily Session Note  Patient Details  Name: Emma Martin MRN: [id number] Date of Birth: 02/23/1980  Today's Date: 02/09/2018 SLP Individual Time: 8280-0349 SLP Individual Time Calculation (min): 53 min  Short Term Goals: Week 1: SLP Short Term Goal 1 (Week 1): STG=LTG due to ELOS  Skilled Therapeutic Interventions:  Pt was seen for skilled ST targeting cognitive goals.  Pt was in recliner upon therapist's arrival, she seemed slightly restless but was also poorly positioned in recliner due to scooting down in chair.  Pt was repositioned to sitting upright and she appeared much calmer and more comfortable.  SLP facilitated the session with a familiar card game to address problem solving goals.  Pt needed min assist verbal cues to plan and execute a problem solving strategy due to decreased attention to task and mild task impulsivity.  Pt was left in recliner with chair alarm set and dad present.  Continue per current plan of care.    Pain Pain Assessment Pain Scale: 0-10 Pain Score: 0-No pain  Therapy/Group: Individual Therapy  Tijuan Dantes, Selinda Orion 02/09/2018, 3:44 PM

## 2018-02-10 ENCOUNTER — Inpatient Hospital Stay (HOSPITAL_COMMUNITY): Payer: Self-pay | Admitting: Occupational Therapy

## 2018-02-10 ENCOUNTER — Inpatient Hospital Stay (HOSPITAL_COMMUNITY): Payer: Self-pay

## 2018-02-10 ENCOUNTER — Inpatient Hospital Stay (HOSPITAL_COMMUNITY): Payer: Self-pay | Admitting: Physical Therapy

## 2018-02-10 ENCOUNTER — Inpatient Hospital Stay (HOSPITAL_COMMUNITY): Payer: Self-pay | Admitting: Speech Pathology

## 2018-02-10 LAB — PATHOLOGIST SMEAR REVIEW

## 2018-02-10 MED ORDER — MAGNESIUM OXIDE 400 (241.3 MG) MG PO TABS
400.0000 mg | ORAL_TABLET | Freq: Two times a day (BID) | ORAL | Status: DC
  Administered 2018-02-10 – 2018-02-12 (×4): 400 mg via ORAL
  Filled 2018-02-10 (×4): qty 1

## 2018-02-10 NOTE — Patient Care Conference (Signed)
Inpatient RehabilitationTeam Conference and Plan of Care Update Date: 02/10/2018   Time: 10:45 AM    Patient Name: Emma Martin      Medical Record Number: [id number]  Date of Birth: Mar 27, 1980 Sex: Female         Room/Bed: 4W22C/4W22C-01 Payor Info: Payor: MEDICAID PENDING / Plan: MEDICAID PENDING / Product Type: *No Product type* /    Admitting Diagnosis: cva  meningitis  Admit Date/Time:  02/03/2018  5:42 PM Admission Comments: No comment available   Primary Diagnosis:  <principal problem not specified> Principal Problem: <principal problem not specified>  Patient Active Problem List   Diagnosis Date Noted  . Right thalamic infarction ([place]) 02/03/2018  . Bacteremia   . Meningitis   . Cerebrovascular accident (CVA) ([place])   . Benign essential HTN   . Tachypnea   . Sensorineural hearing loss (SNHL) of both ears 01/31/2018  . Dyslipidemia 01/31/2018  . Acute ischemic stroke ([place]) 01/30/2018  . Pneumococcal bacteremia   . Pneumococcal meningitis   . Turner syndrome   . Hypothyroidism 01/15/2018  . Generalized anxiety disorder 01/15/2018  . Bicuspid aortic valve 04/06/2013  . HTN (hypertension) 04/06/2013    Expected Discharge Date: Expected Discharge Date: 02/12/18  Team Members Present: Physician leading conference: Dr. Alysia Penna Social Worker Present: Ovidio Kin, LCSW Nurse Present: Ellison Carwin, LPN PT Present: Lavone Nian, PT OT Present: Simonne Come, OT SLP Present: Stormy Fabian, SLP     Current Status/Progress Goal Weekly Team Focus  Medical   Profound hearing loss, poor awareness of bowel and bladder  maintain medical stability  d/c planning   Bowel/Bladder   Incontinent of bowel and bladder LBM 1/21         Swallow/Nutrition/ Hydration             ADL's   CGA to close supervision with bathing, dressing, toilet transfers  Supervision overall  ADL retraining, dynamic standing balance, pt/family education, d/c planning   Mobility   close supervision<>CGA overall with RW & stairs with B rails, significantly impaired sustained attention, recall, awareness  supervision overall  d/c planning, safety awareness, L attention, cognitive remediation, transfers, gait, stair negotiation, family/pt education   Communication             Safety/Cognition/ Behavioral Observations  min assist   supervision   mildly complex problem solving and awareness    Pain   C/o pain  Pain less than 3  assess pain management qshift and PRN   Skin   No skin impairments  Remain free of skin impairments  Skin assessment qshift and PRN      *See Care Plan and progress notes for long and short-term goals.     Barriers to Discharge  Current Status/Progress Possible Resolutions Date Resolved   Physician    Medical stability;IV antibiotics     progressing toward goals  Cont rehab      Nursing                  PT  Behavior  pt with significantly impaired cognition, attention, awareness              OT                  SLP                SW                Discharge Planning/Teaching Needs:  Home with father providing  care, he is here daily and provides support to daughter. Medical issues on bed rest yesterday. Father working on getting her insurance reinstated      Team Discussion:  Goals supervision-CGA. Nursing working on timed tolieting due to incontinence. Fatigues easily this due to meningitis. Requires tactile cues and assist with problem solving and L-inattention. Showering today. Dad here to attend therapies and do hands on training with pt in preparation for DC friday  Revisions to Treatment Plan:  DC 1/24    Continued Need for Acute Rehabilitation Level of Care: The patient requires daily medical management by a physician with specialized training in physical medicine and rehabilitation for the following conditions: Daily direction of a multidisciplinary physical rehabilitation program to ensure safe treatment while eliciting the  highest outcome that is of practical value to the patient.: Yes Daily medical management of patient stability for increased activity during participation in an intensive rehabilitation regime.: Yes Daily analysis of laboratory values and/or radiology reports with any subsequent need for medication adjustment of medical intervention for : Neurological problems   I attest that I was present, lead the team conference, and concur with the assessment and plan of the team.   Elease Hashimoto 02/10/2018, 1:24 PM

## 2018-02-10 NOTE — Progress Notes (Signed)
[place] PHYSICAL MEDICINE & REHABILITATION PROGRESS NOTE   Subjective/Complaints:  EEG suboptimal exam but no epileptiform discharges identified Working with PT, incont of bowel requiring clean up  ROS: Limited due to cognitive/behavioral   Objective:   Ct Head Wo Contrast  Result Date: 02/08/2018 CLINICAL DATA:  Stroke, follow-up, loss of consciousness, history hypertension, Turner's syndrome EXAM: CT HEAD WITHOUT CONTRAST TECHNIQUE: Contiguous axial images were obtained from the base of the skull through the vertex without intravenous contrast. Sagittal and coronal MPR images reconstructed from axial data set. COMPARISON:  01/30/2018 CT head, MR brain 01/30/2018 FINDINGS: Brain: Normal ventricular morphology. No midline shift or mass effect. Evolving infarct at the genu of RIGHT corpus callosum and anterior RIGHT thalamus. No intracranial hemorrhage or mass lesion. Small white matter infarct posterior RIGHT frontal region corresponding to diffusion abnormality on MR. Questionable small focus of petechial gyriform hemorrhage RIGHT frontal lobe image 22. Vascular: No definite hyperdense vessels Skull: Intact Sinuses/Orbits: Partial opacification of LEFT sphenoid sinus, scattered ethmoid air cells, LEFT maxillary sinus Other: N/A IMPRESSION: Evolving infarct at the genu of the RIGHT corpus callosum and anterior RIGHT thalamus as well as a small white matter infarct at the posterior RIGHT frontal deep white matter. Questionable small focus of petechial gyriform hemorrhage versus artifact at RIGHT frontal lobe. Sinus disease changes as above. Electronically Signed   By: Lavonia Dana M.D.   On: 02/08/2018 19:23   Recent Labs    02/08/18 2102 02/09/18 0341  WBC 1.9* 2.8*  HGB 12.2 12.0  HCT 37.3 36.6  PLT 205 215   Recent Labs    02/09/18 0341 02/09/18 1607  NA 130* 130*  K 2.5* 3.0*  CL 93* 94*  CO2 26 26  GLUCOSE 100* 103*  BUN <5* 5*  CREATININE 0.41* 0.37*  CALCIUM 8.0* 7.9*     Intake/Output Summary (Last 24 hours) at 02/10/2018 0929 Last data filed at 02/09/2018 1539 Gross per 24 hour  Intake 1182 ml  Output -  Net 1182 ml     Physical Exam: Vital Signs Blood pressure (!) 150/91, pulse 95, temperature 98.4 F (36.9 C), temperature source Oral, resp. rate 16, SpO2 98 %.   Constitutional: No distress . Vital signs reviewed. HEENT: EOMI, oral membranes moist Neck: supple Cardiovascular: RRR without murmur. No JVD    Respiratory: CTA Bilaterally without wheezes or rales. Normal effort    GI: BS +, non-tender, non-distended  Extremities: No clubbing, cyanosis, or edema Skin: No evidence of breakdown, no evidence of rash Neurologic: Hard of hearing.  Follows basic commands cranial nerves II through XII intact, motor strength is 5/5 in bilateral deltoid, bicep, tricep, grip, hip flexor, knee extensors, ankle dorsiflexor and plantar flexor Unremarkable sensory and cerebellar exam. Musculoskeletal: Full range of motion in all 4 extremities. No joint swelling   Assessment/Plan: 1. 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  Physiatrist is providing close team supervision and 24 hour management of active medical problems listed below.  Physiatrist and rehab team continue to assess barriers to discharge/monitor patient progress toward functional and medical goals  Care Tool:  Bathing    Body parts bathed by patient: Right arm, Left arm, Chest, Abdomen, Front perineal area, Buttocks, Face         Bathing assist Assist Level: Contact Guard/Touching assist     Upper Body Dressing/Undressing Upper body dressing   What is  the patient wearing?: Pull over shirt    Upper body assist Assist Level: Set up assist    Lower Body Dressing/Undressing Lower body dressing      What is the patient  wearing?: Underwear/pull up, Pants     Lower body assist Assist for lower body dressing: Minimal Assistance - Patient > 75%     Toileting Toileting    Toileting assist Assist for toileting: Contact Guard/Touching assist     Transfers Chair/bed transfer  Transfers assist     Chair/bed transfer assist level: Contact Guard/Touching assist     Locomotion Ambulation   Ambulation assist      Assist level: Contact Guard/Touching assist(supervision>CGA/touching assist) Assistive device: Walker-rolling Max distance: 150 ft   Walk 10 feet activity   Assist     Assist level: Contact Guard/Touching assist Assistive device: Walker-rolling   Walk 50 feet activity   Assist    Assist level: Contact Guard/Touching assist Assistive device: Walker-rolling    Walk 150 feet activity   Assist    Assist level: Contact Guard/Touching assist Assistive device: Walker-rolling    Walk 10 feet on uneven surface  activity   Assist Walk 10 feet on uneven surfaces activity did not occur: Safety/medical concerns         Wheelchair     Assist Will patient use wheelchair at discharge?: No Type of Wheelchair: Manual    Wheelchair assist level: Minimal Assistance - Patient > 75% Max wheelchair distance: 100    Wheelchair 50 feet with 2 turns activity    Assist    Wheelchair 50 feet with 2 turns activity did not occur: Safety/medical concerns       Wheelchair 150 feet activity     Assist Wheelchair 150 feet activity did not occur: Safety/medical concerns        Medical Problem List and Plan: 1.  Left side weakness secondary to multifocal infarct involving the right thalamic/IC, left SCA territory, right semi-ovale complicated by recent streptococcal pneumonia bacteremia/meningitis 2 weeks prior with subsequent loss of hearing   --Continue CIR therapies including PT, OT, and SLP  Lethargy noted 1/20 was able to do therapy , no change in strength on  exam, no new sedating meds , no seizures observed, Negative EEG  activity tolerance improved- no further w/u needed at this point, appreciate neuro assist .2.  DVT Prophylaxis/Anticoagulation: SCDs. 3. Pain Management: Tylenol as needed, if needed consider topamax for HA but now mainly c/o neck and back pain, order K pad and sports cream 4. Mood:  Prozac 40 mg daily.Provide emotional support 5. Neuropsych: This patient is not capable of making decisions on her own behalf. 6. Skin/Wound Care:  Routine skin checks 7. Fluids/Electrolytes/Nutrition:  Routine in and out's with follow-up chemistries BUN/creat normal 1/17 8. ID. Continue vancomycin and ceftriaxone through 02/26/2018 per infectious disease.  -Trough level low today, dose of vancomycin being adjusted per pharmacy.  Appreciate their help 9.Hypothyroidism. Synthroid 10. Hyperlipidemia. Lipitor  HypoK improving just added  supplement KCL  89meq BID, KCL rider x 3 , K+ improved to 3.0 cont to monitor Recheck in am      11.  Hyponatremia- 130 1/17, stable 131 1/20  -Serum osmolality 268, urine osmolality and sodium are normal  -Continue to monitor serially 12.  Hypomagnesemia oral supplementation- LOS: 7 days A FACE TO FACE EVALUATION WAS PERFORMED  Charlett Blake 02/10/2018, 9:29 AM

## 2018-02-10 NOTE — Progress Notes (Signed)
Physical Therapy Discharge Summary  Patient Details  Name: Emma Martin MRN: [id number] Date of Birth: [birth year]  Today's Date: 02/11/2018 PT Individual Time: 1400-1500 PT Individual Time Calculation (min): 60 min    Patient has met 6 of 9 long term goals due to improved activity tolerance, improved balance, improved postural control, increased strength, ability to compensate for deficits, functional use of  left upper extremity and left lower extremity and improved coordination.  Patient to discharge at an ambulatory level very close supervision with max tactile & visual cuing for gait, CGA for stair negotiation.   Patient's care partner is independent to provide the necessary physical and cognitive assistance at discharge.  Pt's father Emma Martin) has been present to observe multiple therapy sessions & participated in hands on training for stair negotiation & gait with RW. This therapist has educated him on increased safety with use of stair lift, need for max tactile & visual cuing for safety with gait with RW & all functional mobility tasks, and option for pt to go home at w/c level for transfers only 2/2 significantly impaired cognition.  Pt is very limited in physical abilities 2/2 significant cognitive deficits & new hearing impairments and will require 24 hr assist upon d/c to ensure safety.    Reasons goals not met: Patient remains unsafe for independent bed mobility needing supervision for safety; she also needs min a for stair negotiation and min A for car transfers due to safety reasons.   Recommendation:  Patient will benefit from ongoing skilled PT services in home health setting to continue to advance safe functional mobility, address ongoing impairments in impaired cognition (sustained attention, intellectual awareness, orientation, recall, problem solving), standing balance, transfers, gait, stair negotiation, and minimize fall risk.  Equipment: RW & transport w/c  Reasons  for discharge: discharge from hospital  Patient/family agrees with progress made and goals achieved: Yes  PT Discharge Precautions/Restrictions Precautions Precautions: Fall Precaution Comments: Deaf Restrictions Weight Bearing Restrictions: No   Pain Pain Assessment Pain Scale: 0-10 Pain Score: 0-No pain   Vision/Perception  Pt wears glasses at all times at baseline.  Perception Perception: Impaired Comments: inattention to L side of environment   Cognition Overall Cognitive Status: Impaired/Different from baseline Orientation Level: Oriented to person;Oriented to place Attention: Sustained Sustained Attention: Impaired Sustained Attention Impairment: Functional basic;Verbal basic Memory: Impaired Memory Impairment: Decreased long term memory;Decreased short term memory;Decreased recall of new information Awareness: Impaired Awareness Impairment: Intellectual impairment Problem Solving: Impaired Problem Solving Impairment: Functional basic;Verbal basic Executive Function: Self Monitoring;Self Correcting Self Monitoring: Impaired Self Correcting: Impaired Safety/Judgment: Impaired  Sensation Coordination Gross Motor Movements are Fluid and Coordinated: Yes  Motor  Motor Motor: Abnormal postural alignment and control Motor - Discharge Observations: L hemi, L inattention, generalized deconditioning   Mobility Bed Mobility Bed Mobility: Rolling Right;Supine to Sit Rolling Right: Supervision/verbal cueing Supine to Sit: Supervision/Verbal cueing Transfers Transfers: Sit to Stand;Stand Pivot Transfers Sit to Stand: Supervision/Verbal cueing Stand Pivot Transfers: Supervision/Verbal cueing Stand Pivot Transfer Details: Visual cues/gestures for precautions/safety Transfer (Assistive device): Rolling walker  Locomotion  Gait Ambulation: Yes Gait Assistance: Supervision/Verbal cueing Gait Distance (Feet): 250 Feet Assistive device: Rolling walker Gait  Assistance Details: Visual cues for safe use of DME/AE;Visual cues/gestures for precautions/safety Gait Assistance Details: for L attention/obstacle negotiation Stairs / Additional Locomotion Stairs: Yes Stairs Assistance: Minimal Assistance - Patient > 75% Stair Management Technique: One rail Left;Forwards;Step to pattern Number of Stairs: 12 Height of Stairs: 6 Ramp: Minimal Assistance - Patient >75%  Curb: Minimal Assistance - Patient >75% Wheelchair Mobility Wheelchair Mobility: No   Trunk/Postural Assessment  Cervical Assessment Cervical Assessment: Exceptions to WFL(hx of scoliosis & harrington rods) Thoracic Assessment Thoracic Assessment: Exceptions to WFL(hx of scoliosis & harrington rods) Lumbar Assessment Lumbar Assessment: Exceptions to WFL(hx of scoliosis & harrington rods) Postural Control Postural Control: Deficits on evaluation Righting Reactions: delayed Protective Responses: delayed   Balance Balance Balance Assessed: Yes Standardized Balance Assessment  Standardized Balance Assessment: Berg Balance Test Berg Balance Test completed on 02/05/2018 Sit to Stand: Able to stand without using hands and stabilize independently Standing Unsupported: Able to stand 30 seconds unsupported Sitting with Back Unsupported but Feet Supported on Floor or Stool: Able to sit 2 minutes under supervision Stand to Sit: Uses backs of legs against chair to control descent Transfers: Needs one person to assist Standing Unsupported with Eyes Closed: Able to stand 10 seconds with supervision Standing Ubsupported with Feet Together: Needs help to attain position and unable to hold for 15 seconds From Standing, Reach Forward with Outstretched Arm: Reaches forward but needs supervision From Standing Position, Pick up Object from Floor: Unable to try/needs assist to keep balance From Standing Position, Turn to Look Behind Over each Shoulder: Needs supervision when turning Turn 360 Degrees:  Needs assistance while turning Standing Unsupported, Alternately Place Feet on Step/Stool: Needs assistance to keep from falling or unable to try Standing Unsupported, One Foot in Front: Loses balance while stepping or standing Standing on One Leg: Unable to try or needs assist to prevent fall Total Score: 17  Dynamic Standing Balance Dynamic Standing - Balance Support: No upper extremity supported;During functional activity Dynamic Standing - Level of Assistance: 5: Stand by assistance  Extremity Assessment  RUE Assessment RUE Assessment: Within Functional Limits LUE Assessment LUE Assessment: Within Functional Limits  RLE Assessment RLE Assessment: Within Functional Limits LLE Assessment LLE Assessment: Within Functional Limits General Strength Comments: some L knee buckling when ascending stairs leading with LLE, otherwise no buckling noted during gait, transfers, standing   Lavone Nian, PT, DPT  02/10/2018, 11:27 AM   Reginia Naas, PT 02/11/2018, 6:06 PM

## 2018-02-10 NOTE — Progress Notes (Signed)
Occupational Therapy Session Note  Patient Details  Name: Emma Martin MRN: [id number] Date of Birth: 17-Aug-1980  Today's Date: 02/10/2018 OT Individual Time: 1400-1430 OT Individual Time Calculation (min): 30 min    Short Term Goals: Week 1:  OT Short Term Goal 1 (Week 1): STG = LTGs due to ELOS  Skilled Therapeutic Interventions/Progress Updates:    Treatment session with focus on bathing in shower setup to increase participation and functional carryover.  Pt received upright in w/c finishing with SLP.  Pt agreeable to shower.  Therapist applied "seal wrap" to IV line in preparation for shower.  Pt ambulated to bathroom with RW with CGA, pt with 1 LOB when exiting shower.  Completed bathing at sit > stand level with CGA and tactile cues for sequencing due to perseveration during bathing.  Passed pt off to next OT to complete dressing tasks.  Therapy Documentation Precautions:  Precautions Precautions: Fall Precaution Comments: Deaf Restrictions Weight Bearing Restrictions: No General:   Vital Signs: Therapy Vitals Temp: 98.2 F (36.8 C) Pulse Rate: (!) 108 Resp: 17 BP: 136/90 Patient Position (if appropriate): Sitting Oxygen Therapy SpO2: 99 % O2 Device: Room Air Pain: Pain Assessment Pain Scale: 0-10 Pain Score: 0-No pain   Therapy/Group: Individual Therapy  Simonne Come 02/10/2018, 3:57 PM

## 2018-02-10 NOTE — Plan of Care (Signed)
  Problem: Consults Goal: RH STROKE PATIENT EDUCATION Description See Patient Education module for education specifics  Outcome: Progressing   Problem: RH BOWEL ELIMINATION Goal: RH STG MANAGE BOWEL WITH ASSISTANCE Description STG Manage Bowel with Mod.Assistance.  Outcome: Progressing   Problem: RH BLADDER ELIMINATION Goal: RH STG MANAGE BLADDER WITH ASSISTANCE Description STG Manage Bladder With Mod.Assistance  Outcome: Progressing   Problem: RH SKIN INTEGRITY Goal: RH STG SKIN FREE OF INFECTION/BREAKDOWN Description With Mod. assist  Outcome: Progressing Goal: RH STG MAINTAIN SKIN INTEGRITY WITH ASSISTANCE Description STG Maintain Skin Integrity With Mod Assistance.  Outcome: Progressing   Problem: RH SAFETY Goal: RH STG ADHERE TO SAFETY PRECAUTIONS W/ASSISTANCE/DEVICE Description STG Adhere to Safety Precautions With Mod. Assistance/Device.  Outcome: Progressing   Problem: RH PAIN MANAGEMENT Goal: RH STG PAIN MANAGED AT OR BELOW PT'S PAIN GOAL Description Less tha 3.  Outcome: Progressing

## 2018-02-10 NOTE — Progress Notes (Addendum)
Physical Therapy Session Note  Patient Details  Name: Emma Martin MRN: [id number] Date of Birth: [birth year]  Today's Date: 02/10/2018 PT Individual Time: 0907-1030 PT Individual Time Calculation (min): 83 min   Short Term Goals: Week 1:  PT Short Term Goal 1 (Week 1): STG=LTG due to ELOS  Skilled Therapeutic Interventions/Progress Updates:  Pt verbally cleared for participation by PA Linna Hoff).  Pt received in bed & agreeable to tx, reporting she is feeling better. Pt unaware of bladder incontinence & saturated bed. Pt transfers to EOB with supervision and therapist assists her with doffing soiled brief, donning clean one, and donning paper scrub pants & shirt. Pt then found to be incontinent of bowels and ambulates in/out of bathroom without AD & min assist. Pt with continent void on toilet and attempts to perform peri hygiene with significantly extra time and therapist assisting. Therapist assisted with donning clean brief. Pt performed hand hygiene standing at sink with close supervision. Transported pt to gym & [name] arrived to observe session. He reports pt has access to 1-2 rails depending upon position of stair lift at home so pt practiced stair negotiation in stairwell with L ascending rail with pt demonstrating decreased ability to follow visual/tactile cuing for compensatory pattern and with some L knee buckling when ascending stairs with LLE with improvement with ascending with RLE with min assist overall. Pt & father return demonstrated ability to assist pt with stair negotiation; this therapist recommended for pt to use stair lift if possible for increased safety until she practiced stair negotiation further with HHPT. Pt completed floor transfer with min assist with MAX multimodal cuing for sequencing, technique, and placement with assistance for balance & to lift off floor to push to sitting on mat table - pt completed floor transfer twice with significant fatigue after  each trial. Pt & father ambulated 100 ft with RW & Karle Starch providing tactile cuing and light CGA with therapist educating him on pt's L inattention, need to stand on her L side, and cuing for safety & need for clear home environment to reduce tripping hazards & fall risks. At end of session pt assisted back to bed & left in bed with alarm set & needs at hand.  No c/o pain reported. Continued to use written communication with pt.   Therapy Documentation Precautions:  Precautions Precautions: Fall Precaution Comments: Deaf Restrictions Weight Bearing Restrictions: No    Therapy/Group: Individual Therapy  [name] 02/10/2018, 10:58 AM

## 2018-02-10 NOTE — Progress Notes (Signed)
Social Work Patient ID: Emma Martin, female   DOB: Mar 19, 1980, 38 y.o.   MRN: [id number] Met with pt and Dad to discuss team conference goals supervision level and target discharge 1/24. Dad has been here today to attend therapies with pt and learn her care. Discussed follow up and equipment needs. Will work on for Friday discharge. Will resume home health services.

## 2018-02-10 NOTE — Progress Notes (Signed)
Speech Language Pathology Daily Session Note  Patient Details  Name: Emma Martin MRN: [id number] Date of Birth: 19-Feb-1980  Today's Date: 02/10/2018 SLP Individual Time: 1330-1400 SLP Individual Time Calculation (min): 30 min  Short Term Goals: Week 1: SLP Short Term Goal 1 (Week 1): STG=LTG due to ELOS  Skilled Therapeutic Interventions:  Skilled treatment session focused on cognition goals. SLP facilitated session by providing question cues regarding discharge planning and discharge home. Pt expresses fear of discharging home and then having to come back to hospital. Pt able to list reasons why she might need to come back to hospital (increased headache/fall). Reviewed safety awareness - when moving decreases distractions, establish routine, timed toileting. Her dad and she voiced understanding. Pt's father asked about pt's memory. Pt continues to ask about her mother who has been dead for 2 years. Her father has stated that her mother is at home. He asked when he should start telling pt the truth. When questioned by SLP, pt states that she is going home with her father and mother. Education provided that her mother passed away 2 years ago. Her father supported this answer and pt was labile to this information. After gentle education, pt able to comprehend and state that she was discharging home with her father.  Father states that he will continue to support with truth. Pt was handed off to OT.      Pain Pain Assessment Pain Scale: 0-10 Pain Score: 0-No pain  Therapy/Group: Individual Therapy  Franck Vinal 02/10/2018, 2:14 PM

## 2018-02-10 NOTE — Progress Notes (Signed)
Occupational Therapy Session Note  Patient Details  Name: Emma Martin MRN: [id number] Date of Birth: 1980/03/09  Today's Date: 02/10/2018 OT Individual Time: 1430-1515 OT Individual Time Calculation (min): 45 min    Short Term Goals: Week 1:  OT Short Term Goal 1 (Week 1): STG = LTGs due to ELOS  Skilled Therapeutic Interventions/Progress Updates:    1;1. Pt received direct handoff from previous OT. Pt completes dressing sit to stand from w/c with min A provided to untwist bra in back and A overall for LB dressing with speech to text used for cues for initiation as pt easily distracted by TV. Pt stands at sink to dry hair with hairdryer with VC for use of LUE incorporated into activity with CGA for balance. Pt stands to play 2 rounds of horse shoes with sit to stand for every throw at CGA level to improve standing balance and LE strength required for BADLs. Exited session with pt seated in w/c, call light in reach and all needs met.  Therapy Documentation Precautions:  Precautions Precautions: Fall Precaution Comments: Deaf Restrictions Weight Bearing Restrictions: No General:   Vital Signs: Therapy Vitals Temp: 98.2 F (36.8 C) Pulse Rate: (!) 108 Resp: 17 BP: 136/90 Patient Position (if appropriate): Sitting Oxygen Therapy SpO2: 99 % O2 Device: Room Air Pain: Pain Assessment Pain Scale: 0-10 Pain Score: 0-No pain ADL: ADL Grooming: Setup, Minimal assistance Where Assessed-Grooming: Standing at sink, Sitting at sink Upper Body Bathing: Contact guard Where Assessed-Upper Body Bathing: Shower Lower Body Bathing: Minimal assistance Where Assessed-Lower Body Bathing: Shower Upper Body Dressing: Minimal cueing, Minimal assistance Where Assessed-Upper Body Dressing: Sitting at sink Lower Body Dressing: Moderate assistance, Minimal cueing Where Assessed-Lower Body Dressing: Sitting at sink, Standing at sink Toileting: Minimal assistance Where Assessed-Toileting:  Glass blower/designer: Psychiatric nurse Method: Counselling psychologist: Raised toilet seat, Grab bars(RW) Social research officer, government: Environmental education officer Method: Print production planner with back, Landscape architect   Exercises:   Other Treatments:     Therapy/Group: Individual Therapy  Emma Martin 02/10/2018, 4:57 PM

## 2018-02-11 ENCOUNTER — Inpatient Hospital Stay (HOSPITAL_COMMUNITY): Payer: Self-pay | Admitting: Occupational Therapy

## 2018-02-11 ENCOUNTER — Inpatient Hospital Stay (HOSPITAL_COMMUNITY): Payer: Self-pay

## 2018-02-11 ENCOUNTER — Inpatient Hospital Stay (HOSPITAL_COMMUNITY): Payer: Self-pay | Admitting: Speech Pathology

## 2018-02-11 LAB — BASIC METABOLIC PANEL
ANION GAP: 11 (ref 5–15)
BUN: <5 mg/dL — ABNORMAL LOW (ref 6–20)
CO2: 27 mmol/L (ref 22–32)
Calcium: 8.3 mg/dL — ABNORMAL LOW (ref 8.9–10.3)
Chloride: 95 mmol/L — ABNORMAL LOW (ref 98–111)
Creatinine, Ser: 0.35 mg/dL — ABNORMAL LOW (ref 0.44–1.00)
GFR calc Af Amer: >60 mL/min (ref >60)
GFR calc non Af Amer: >60 mL/min (ref >60)
GLUCOSE: 111 mg/dL — AB (ref 70–99)
Potassium: 2.8 mmol/L — ABNORMAL LOW (ref 3.5–5.1)
Sodium: 133 mmol/L — ABNORMAL LOW (ref 135–145)

## 2018-02-11 LAB — VANCOMYCIN, TROUGH: Vancomycin Tr: 17 ug/mL (ref 15–20)

## 2018-02-11 MED ORDER — LEVOTHYROXINE SODIUM 75 MCG PO TABS: 75.0000 ug | ORAL_TABLET | Freq: Every day | ORAL | 0 refills | Status: AC

## 2018-02-11 MED ORDER — VANCOMYCIN IV (FOR PTA / DISCHARGE USE ONLY): 1000.0000 mg | Freq: Four times a day (QID) | INTRAVENOUS | 0 refills | Status: DC

## 2018-02-11 MED ORDER — VANCOMYCIN HCL IN DEXTROSE 1-5 GM/200ML-% IV SOLN: 1000.0000 mg | Freq: Four times a day (QID) | INTRAVENOUS | Status: DC

## 2018-02-11 MED ORDER — POTASSIUM CHLORIDE 10 MEQ/100ML IV SOLN
10.0000 meq | INTRAVENOUS | Status: AC
  Administered 2018-02-11 (×2): 10 meq via INTRAVENOUS
  Filled 2018-02-11 (×2): qty 100

## 2018-02-11 MED ORDER — CEFTRIAXONE IV (FOR PTA / DISCHARGE USE ONLY): 2.0000 g | Freq: Two times a day (BID) | INTRAVENOUS | 0 refills | Status: DC

## 2018-02-11 MED ORDER — ATORVASTATIN CALCIUM 20 MG PO TABS: 20.0000 mg | ORAL_TABLET | Freq: Every day | ORAL | 1 refills | Status: AC

## 2018-02-11 MED ORDER — FLUOXETINE HCL 40 MG PO CAPS: 40.0000 mg | ORAL_CAPSULE | Freq: Every day | ORAL | 3 refills | Status: AC

## 2018-02-11 MED ORDER — MAGNESIUM OXIDE 400 (241.3 MG) MG PO TABS: 400.0000 mg | ORAL_TABLET | Freq: Two times a day (BID) | ORAL | 0 refills | Status: DC

## 2018-02-11 NOTE — Progress Notes (Signed)
Physical Therapy Session Note  Patient Details  Name: Emma Martin MRN: [id number] Date of Birth: 04-11-1980  Today's Date: 02/11/2018 PT Individual Time: 1400-1500 PT Individual Time Calculation (min): 60 min   Short Term Goals:  Week 1:  PT Short Term Goal 1 (Week 1): STG=LTG due to ELOS  Skilled Therapeutic Interventions/Progress Updates:    Patient seen with father present and performed discharge assessment including supine<>sit with S and increased time, sit to stand with S, gait close S with tactile and visual cues for L side awareness and safety, stopping to give instructions at times via visual text.  Patient ambulated 220'.  Negotiated 12 steps with two hands on rail on L and min A.  Education with father to utilize stair lift for safety and continue working on steps with HHPT.  Patient performed car transfer with min A.  Sit<>stand x 10, then supine bridging, lateral trunk rolls, hip flexion in hooklying and hip abduction with manual resistance.  Patient ambulated back to room and returned to supine with father present.  Education to patient via visual text on plan for continued therapy at home, dad to be "boss" and not to get up without supervision.  Father included in conversation and both in agreement.  Left pt in supine with father present and bed alarm on.  Therapy Documentation Precautions:  Precautions Precautions: Fall Precaution Comments: Deaf Restrictions Weight Bearing Restrictions: No Pain: Pain Assessment Pain Scale: 0-10 Pain Score: 0-No pain    Therapy/Group: Individual Therapy  Reginia Naas  [place], [place] 02/11/2018  02/11/2018, 5:47 PM

## 2018-02-11 NOTE — Progress Notes (Signed)
Occupational Therapy Session Note  Patient Details  Name: ADELYNA BROCKMAN MRN: [id number] Date of Birth: Feb 17, 1980  Today's Date: 02/11/2018 OT Individual Time: 0830-0900 OT Individual Time Calculation (min): 30 min    Short Term Goals: Week 1:  OT Short Term Goal 1 (Week 1): STG = LTGs due to ELOS  Skilled Therapeutic Interventions/Progress Updates:    1:1 Pt in bed with breakfast in front of her but not demonstrating initiation to open silverware and begin self feeding. Pt came to EOB with min A (coming up on her right side). Once food warmed again and pt with setup, pt began to self feed breakfast. Min - mod cues for awareness in the left field. Pt did spill coffee on therapist with use of left hand when she went to point to something.  Communication through writing. Pt had been incontinent of urine and bowel- pt unaware. Transfer with close supervision to [facility] and total A for hygiene and clothing management of soiled clothing. Pt with close supervision transfer into recliner. Safety belt donned.   Therapy Documentation Precautions:  Precautions Precautions: Fall Precaution Comments: Deaf Restrictions Weight Bearing Restrictions: No Pain: Pain Assessment Pain Scale: 0-10 Pain Score: 0-No pain   Therapy/Group: Individual Therapy  Willeen [facility] 02/11/2018, 9:13 AM

## 2018-02-11 NOTE — Progress Notes (Signed)
Speech Language Pathology Discharge Summary  Patient Details  Name: Emma Martin MRN: [id number] Date of Birth: 1980/04/02  Today's Date: 02/11/2018 SLP Individual Time: 1045-1130 SLP Individual Time Calculation (min): 45 min   Skilled Therapeutic Interventions:  Skilled treatment session focused on cognition goals. SLP facilitated session by providing Mod A to problem game of Connect 4. Pt required continuous cues to sit up. Education completed with pt's father on creating a schedule to target increasing pt's endurance and promoting cognitive stimulation (such as a game time). All questions answered to father's satisfaction.     Patient has met 2 of 2 long term goals.  Patient to discharge at overall Supervision;Min level.    Clinical Impression/Discharge Summary:   Pt has made slow progress in skilled ST sessions and as a result she is discharging at a Min A overall cognitive level. Pt continues to require HHST or even Outpatient Therapy to target in creased attention to ask, problem solving basic to semi-complex tasks and awareness.   Care Partner:  Caregiver Able to Provide Assistance: Yes;No  Type of Caregiver Assistance: Physical;Cognitive  Recommendation:  Outpatient SLP;24 hour supervision/assistance  Rationale for SLP Follow Up: Maximize cognitive function and independence   Equipment:     Reasons for discharge: Treatment goals met;Discharged from hospital   Patient/Family Agrees with Progress Made and Goals Achieved: Yes    Kween Bacorn 02/11/2018, 4:00 PM

## 2018-02-11 NOTE — Progress Notes (Signed)
Occupational Therapy Session Note  Patient Details  Name: Emma Martin MRN: [id number] Date of Birth: 1980-05-07  Today's Date: 02/11/2018 OT Individual Time: 9977-4142 OT Individual Time Calculation (min): 25 min  and Today's Date: 02/11/2018 OT Missed Time: 35 Minutes Missed Time Reason: Patient fatigue   Short Term Goals: Week 1:  OT Short Term Goal 1 (Week 1): STG = LTGs due to ELOS  Skilled Therapeutic Interventions/Progress Updates:    Attempted to engage pt in therapeutic activity to include functional mobility and/or self-care tasks.  Pt received semi-reclined in recliner asleep. Pt aroused to touch to wave to therapist but then closing eyes. Therapist utilized talk to text to communicate with pt, to which she would open her eyes to read information but then close eyes back, without response, and return to sleep.  Notified pt's LPN regarding current status.  Pt's father arrived during session.  Discussed current fatigue/sleepiness and how he would care for her at home.  Pt's father reports that he would allow her to sleep as needed, but would frequently check on her and offer toileting about every 2 hours during day.  Discussed increased fall risk and risk of injury to pt or caregiver when encouraging mobility during drowsy periods with pt's father in agreement.  Therapist and pt's father agreed to allow pt to rest and this therapist will attempt to return when pt is more alert.  Pt missed 35 mins scheduled OT treatment session.  Therapy Documentation Precautions:  Precautions Precautions: Fall Precaution Comments: Deaf Restrictions Weight Bearing Restrictions: No General: General OT Amount of Missed Time: 35 Minutes Pain:  Pt with no c/o pain   Therapy/Group: Individual Therapy  Simonne Come 02/11/2018, 12:41 PM

## 2018-02-11 NOTE — Progress Notes (Signed)
PHARMACY CONSULT NOTE FOR:  OUTPATIENT  PARENTERAL ANTIBIOTIC THERAPY (OPAT)  Indication: Meningitis  Regimen: Vancomycin 1g IV Q6h and ceftriaxone 2g IV Q12h End date: 02/26/2018  IV antibiotic discharge orders are pended. To discharging provider:  please sign these orders via discharge navigator,  Select New Orders & click on the button choice - Manage This Unsigned Work.    Thank you for allowing pharmacy to be a part of this patient's care.  Reginia Naas 02/11/2018, 12:31 PM

## 2018-02-11 NOTE — Discharge Summary (Signed)
Discharge summary job [id number]

## 2018-02-11 NOTE — Discharge Instructions (Signed)
Inpatient Rehab Discharge Instructions  Emma Martin Discharge date and time: No discharge date for patient encounter.   Activities/Precautions/ Functional Status: Activity: activity as tolerated Diet: regular diet Wound Care: none needed  STROKE/TIA DISCHARGE INSTRUCTIONS SMOKING Cigarette smoking nearly doubles your risk of having a stroke & is the single most alterable risk factor  If you smoke or have smoked in the last 12 months, you are advised to quit smoking for your health.  Most of the excess cardiovascular risk related to smoking disappears within a year of stopping.  Ask you doctor about anti-smoking medications  [place] Quit Line: 1-800-QUIT NOW  Free Smoking Cessation Classes [phone]  CHOLESTEROL Know your levels; limit fat & cholesterol in your diet  Lipid Panel     Component Value Date/Time   CHOL 214 (H) 01/31/2018 0701   TRIG 84 01/31/2018 0701   HDL 45 01/31/2018 0701   CHOLHDL 4.8 01/31/2018 0701   VLDL 17 01/31/2018 0701   LDLCALC 152 (H) 01/31/2018 0701      Many patients benefit from treatment even if their cholesterol is at goal.  Goal: Total Cholesterol (CHOL) less than 160  Goal:  Triglycerides (TRIG) less than 150  Goal:  HDL greater than 40  Goal:  LDL (LDLCALC) less than 100   BLOOD PRESSURE American Stroke Association blood pressure target is less that 120/80 mm/Hg  Your discharge blood pressure is:  BP: (!) 142/75  Monitor your blood pressure  Limit your salt and alcohol intake  Many individuals will require more than one medication for high blood pressure  DIABETES (A1c is a blood sugar average for last 3 months) Goal HGBA1c is under 7% (HBGA1c is blood sugar average for last 3 months)  Diabetes: No known diagnosis of diabetes    Lab Results  Component Value Date   HGBA1C 5.1 01/31/2018     Your HGBA1c can be lowered with medications, healthy diet, and exercise.  Check your blood sugar as directed by your  physician  Call your physician if you experience unexplained or low blood sugars.  PHYSICAL ACTIVITY/REHABILITATION Goal is 30 minutes at least 4 days per week  Activity: Increase activity slowly, Therapies: Physical Therapy: Home Health Return to work:   Activity decreases your risk of heart attack and stroke and makes your heart stronger.  It helps control your weight and blood pressure; helps you relax and can improve your mood.  Participate in a regular exercise program.  Talk with your doctor about the best form of exercise for you (dancing, walking, swimming, cycling).  DIET/WEIGHT Goal is to maintain a healthy weight  Your discharge diet is:  Diet Order            Diet Heart Room service appropriate? Yes; Fluid consistency: Thin  Diet effective now              liquids Your height is:    Your current weight is:   Your Body Mass Index (BMI) is:     Following the type of diet specifically designed for you will help prevent another stroke.  Your goal weight range is:    Your goal Body Mass Index (BMI) is 19-24.  Healthy food habits can help reduce 3 risk factors for stroke:  High cholesterol, hypertension, and excess weight.  RESOURCES Stroke/Support Group:  Call [phone]   STROKE EDUCATION PROVIDED/REVIEWED AND GIVEN TO PATIENT Stroke warning signs and symptoms How to activate emergency medical system (call 911). Medications prescribed at discharge.  Need for follow-up after discharge. Personal risk factors for stroke. Pneumonia vaccine given:  Flu vaccine given:  My questions have been answered, the writing is legible, and I understand these instructions.  I will adhere to these goals & educational materials that have been provided to me after my discharge from the hospital.    Functional status:  ___ No restrictions     ___ Walk up steps independently ___ 24/7 supervision/assistance   ___ Walk up steps with assistance ___ Intermittent  supervision/assistance  ___ Bathe/dress independently ___ Walk with walker     ___ Bathe/dress with assistance ___ Walk Independently    ___ Shower independently ___ Walk with assistance    ___ Shower with assistance ___ No alcohol     ___ Return to work/school ________  Special Instructions:  Home health nurse for administration of intravenous Rocephin 2 g every 12 hours as well as intravenous vancomycin 1000 mg every 6 hours through 02/26/2018 and stop   COMMUNITY REFERRALS UPON DISCHARGE:    Home Health:   PT, OT, SP, RN  [facility]   Date of last service:02/12/2018   Medical Equipment/Items [place]   GENERAL COMMUNITY RESOURCES FOR PATIENT/FAMILY: Support Groups:[facility] SUPPORT GROUP  THE SECOND Thursday @ 6:00-7:00 PM ON THE REHAB UNIT QUESTIONS CONTACT AMY [phone]  My questions have been answered and I understand these instructions. I will adhere to these goals and the provided educational materials after my discharge from the hospital.  Patient/Caregiver Signature _______________________________ Date __________  Clinician Signature _______________________________________ Date __________  Please bring this form and your medication list with you to all your follow-up doctor's appointments.

## 2018-02-11 NOTE — Progress Notes (Signed)
[place] PHYSICAL MEDICINE & REHABILITATION PROGRESS NOTE   Subjective/Complaints:  Patient eating breakfast, discussed with OT, poor awareness for bowel or bladder incontinence ROS: Limited due to cognitive/behavioral   Objective:   No results found. Recent Labs    02/08/18 2102 02/09/18 0341  WBC 1.9* 2.8*  HGB 12.2 12.0  HCT 37.3 36.6  PLT 205 215   Recent Labs    02/09/18 1607 02/11/18 0446  NA 130* 133*  K 3.0* 2.8*  CL 94* 95*  CO2 26 27  GLUCOSE 103* 111*  BUN 5* <5*  CREATININE 0.37* 0.35*  CALCIUM 7.9* 8.3*    Intake/Output Summary (Last 24 hours) at 02/11/2018 1550 Last data filed at 02/11/2018 1509 Gross per 24 hour  Intake 700 ml  Output -  Net 700 ml     Physical Exam: Vital Signs Blood pressure (!) 153/94, pulse 94, temperature 98.3 F (36.8 C), temperature source Oral, resp. rate 16, SpO2 98 %.   Constitutional: No distress . Vital signs reviewed. HEENT: EOMI, oral membranes moist Neck: supple Cardiovascular: RRR without murmur. No JVD    Respiratory: CTA Bilaterally without wheezes or rales. Normal effort    GI: BS +, non-tender, non-distended  Extremities: No clubbing, cyanosis, or edema Skin: No evidence of breakdown, no evidence of rash Neurologic: Hard of hearing.  Follows basic commands cranial nerves II through XII intact, motor strength is 5/5 in bilateral deltoid, bicep, tricep, grip, hip flexor, knee extensors, ankle dorsiflexor and plantar flexor Unremarkable sensory and cerebellar exam. Musculoskeletal: Full range of motion in all 4 extremities. No joint swelling   Assessment/Plan: 1. 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  Physiatrist is providing close team supervision and 24 hour management of active medical problems listed below.  Physiatrist and rehab team  continue to assess barriers to discharge/monitor patient progress toward functional and medical goals  Care Tool:  Bathing    Body parts bathed by patient: Right arm, Left arm, Chest, Abdomen, Front perineal area, Buttocks, Face, Right upper leg, Left upper leg, Right lower leg, Left lower leg         Bathing assist Assist Level: Contact Guard/Touching assist     Upper Body Dressing/Undressing Upper body dressing   What is the patient wearing?: Pull over shirt    Upper body assist Assist Level: Set up assist    Lower Body Dressing/Undressing Lower body dressing      What is the patient wearing?: Underwear/pull up, Pants     Lower body assist Assist for lower body dressing: Minimal Assistance - Patient > 75%     Toileting Toileting    Toileting assist Assist for toileting: Moderate Assistance - Patient 50 - 74%     Transfers Chair/bed transfer  Transfers assist     Chair/bed transfer assist level: Supervision/Verbal cueing     Locomotion Ambulation   Ambulation assist      Assist level: Contact Guard/Touching assist Assistive device: Walker-rolling Max distance: 100 ft    Walk 10 feet activity   Assist     Assist level: Contact Guard/Touching assist Assistive device: Walker-rolling   Walk 50 feet activity   Assist    Assist level: Contact Guard/Touching assist Assistive device: Walker-rolling    Walk 150 feet activity   Assist    Assist level: Contact Guard/Touching assist Assistive device: Walker-rolling    Walk 10 feet on  uneven surface  activity   Assist Walk 10 feet on uneven surfaces activity did not occur: Safety/medical concerns         Wheelchair     Assist Will patient use wheelchair at discharge?: No Type of Wheelchair: Manual    Wheelchair assist level: Minimal Assistance - Patient > 75% Max wheelchair distance: 100    Wheelchair 50 feet with 2 turns activity    Assist    Wheelchair 50 feet  with 2 turns activity did not occur: Safety/medical concerns       Wheelchair 150 feet activity     Assist Wheelchair 150 feet activity did not occur: Safety/medical concerns        Medical Problem List and Plan: 1.  Left side weakness secondary to multifocal infarct involving the right thalamic/IC, left SCA territory, right semi-ovale complicated by recent streptococcal pneumonia bacteremia/meningitis 2 weeks prior with subsequent loss of hearing   --Continue CIR therapies including PT, OT, and SLP  Intermittent lethargy consistent with underlying diagnosis of meningitis .2.  DVT Prophylaxis/Anticoagulation: SCDs. 3. Pain Management: Tylenol as needed, if needed consider topamax for HA but now mainly c/o neck and back pain, order K pad and sports cream 4. Mood:  Prozac 40 mg daily.Provide emotional support 5. Neuropsych: This patient is not capable of making decisions on her own behalf. 6. Skin/Wound Care:  Routine skin checks 7. Fluids/Electrolytes/Nutrition:  Routine in and out's with follow-up chemistries BUN/creat normal 1/17 8. ID. Continue vancomycin and ceftriaxone through 02/26/2018 per infectious disease.  -Trough level low today, dose of vancomycin being adjusted per pharmacy.  Appreciate their help 9.Hypothyroidism. Synthroid 10. Hyperlipidemia. Lipitor  HypoK, persistent down to 2.8 will do K rider x2    11.  Hyponatremia- 130 1/17, stable 131 1/20, improved to 133 on 02/11/2018  -Serum osmolality 268, urine osmolality and sodium are normal  -Continue to monitor serially 12.  Hypomagnesemia oral supplementation-400 mg twice daily LOS: 8 days A FACE TO FACE EVALUATION WAS PERFORMED  Charlett Blake 02/11/2018, 3:50 PM

## 2018-02-11 NOTE — Discharge Summary (Signed)
Emma Martin, Emma Martin ACCOUNT [id number] DATE OF BIRTH:[birth year] FACILITY: [facility] LOCATION: [facility] PHYSICIAN:ANDREW KIRSTEINS, MD  DISCHARGE SUMMARY  DATE OF DISCHARGE:  02/12/2018  DISCHARGE DIAGNOSES: 1.  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 2.  Sequential compression devices for deep venous thrombosis prophylaxis. 3.  Pain management. 4.  Streptococcal pneumonia bacteremia/meningitis. 5.  Hypothyroidism. 6.  Hyperlipidemia. 7.  Hypomagnesemia. 8.  Hypokalemia.  HOSPITAL COURSE:  This is a 38 year old right-handed female with history of hypertension, hypothyroidism, Turner syndrome, bicuspid aortic valve.  Lives with her father.  Was independent using a walker prior to admission.  Recently hospitalized for streptococcal meningitis, had finished an antibiotic treatment.  Subsequent hearing loss  bilaterally.  Presented 01/30/2018 with left-sided weakness.  She was found on the floor unresponsive by her father.  Cranial CT scan unremarkable.  She did not receive tPA.  CT angiogram of head and neck with no emergent large vessel occlusion.  MRI/MRA  showed scattered diffusion abnormality overlying right cerebral convexity as well as the suprasellar cisterns.  Associated scattered multifocal acute, early subacute ischemic infarctions followed by infectious disease, maintained on antibiotic therapy.   She had been scheduled to be completed 01/29/2018.  Date now changed to 02/26/2018.  TEE showed no endocarditis or PFO.  Tolerating a regular diet.  The patient was admitted for a comprehensive rehabilitation program.  PAST MEDICAL HISTORY:  See discharge diagnoses.  SOCIAL HISTORY:  She lives with her father.  FUNCTIONAL STATUS:  Upon admission to rehab services was minimal assist ambulation, 1-person handheld assist; minimal  assist sit to stand; min mod assist with activities of daily living.  PHYSICAL EXAMINATION: VITAL SIGNS:  Blood pressure 140/89, pulse 90, temperature 99, respirations 18. GENERAL:  Alert female.  Mood was flat.  She was deaf bilaterally.  She would provide some answers by reading lips.  Followed simple demonstrated commands. HEENT:  EOMs intact. NECK:  Supple, nontender, no JVD. CARDIOVASCULAR:  Rate controlled. ABDOMEN:  Soft, nontender, good bowel sounds. LUNGS:  Clear to auscultation without wheeze.  REHABILITATION HOSPITAL COURSE:  The patient was admitted to inpatient rehab services.  Therapies initiated on a 3-hour daily basis, consisting of physical therapy, occupational therapy, speech therapy and rehab nursing.  The following issues were  addressed:  Pertaining to the patient's right thalamic and internal capsule left SCA territory infarction, remained stable.  She would follow up with neurology services.  Most latest followup scan due to some lethargy unchanged.  EEG negative for  seizure.  SCDs for DVT prophylaxis.  Blood pressure is controlled and her home lisinopril had been held for permissive hypertension and could be resumed as outpatient as needed.  She continued on vancomycin and Rocephin through 02/26/2018 for Streptococcal pneumonia bacteremia.  Follow up infectious disease.  She remained afebrile.  Hypothyroidism with Synthroid  supplement ongoing.  Lipitor for hyperlipidemia.  Bouts of hypokalemia with potassium supplement as well as hyponatremia resolving. Her latest potassium is 3.0 that did show improvement her potassium was increased to 40 mEq 3 times a day and a home health nurse was to follow-up chemistries.  The patient received weekly collaborative interdisciplinary team conferences to discuss estimated length of stay, family  teaching, any barriers to her discharge.  Transfers edge of bed supervision.  Routine toileting schedule.  Ambulates with a rolling walker, light  contact guard assist.  Ongoing education with her father.  She did have some  left side inattention.   Activities of daily living and homemaking.  Ambulates to the bathroom, rolling walker, contact guard.  Completes bathing and sit to stand level contact guard assist.  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  Full family  teaching completed.  DISCHARGE MEDICATIONS:  Included Lipitor 20 mg p.o. daily, Rocephin 2 g every 12 hours through 02/26/2018 and stop.  Prozac 40 mg p.o. daily, Flonase 2 sprays each nostril daily, Synthroid 75 mcg p.o. daily, magnesium oxide 400 mg p.o. b.i.d., vancomycin  1000 mg intravenous every 6 hours through 02/26/2018 and stop.potassium chloride 40 mEq 3 times a day  DIET:  Regular.  Potassium chloride adjusted accordingly for hypokalemia.  The patient would follow up with Dr. Alysia Penna at the outpatient rehab center as advised; Dr. Erlinda Hong of [facility], call for appointment; Dr. Michel Bickers, call for appointment; Dr. Maurice Small, medical management.  SPECIAL INSTRUCTIONS:  Home health nurse for administration of intravenous Rocephin 2 g every 12 hours as well as vancomycin through 02/26/2018 and stop.  Home health nurse to check BMET Monday, 02/15/2018 results to [place] [phone]  LN/NUANCE D:02/11/2018 T:02/11/2018 JOB:[id number]

## 2018-02-11 NOTE — Progress Notes (Signed)
Social Work  Discharge Note  The overall goal for the admission was met for:   Discharge location: Yes-HOME WITH DAD-24 HR CARE  Length of Stay: Yes-9 DAYS  Discharge activity level: Yes-SUPERVISION WITH CUES  Home/community participation: Yes  Services provided included: MD, RD, PT, OT, SLP, RN, CM, Pharmacy and SW  Financial Services: Other: PENDING MEDICAID  Follow-up services arranged: Home Health: [facility]-PT, OT, SP, RN, SW, DME: [place] and Patient/Family request agency HH: [facility], DME: [facility]  Comments (or additional information):DAD HAS BEEN HERE DAILY AND PARTICIPATED IN THERAPIES WITH PT AND AWARE OF HER NEED FOR 24 HR CARE. WAS ALREADY DOING IV ANTIBIOTICS AT HOME PRIOR TO ADMISSION FOR [facility]  Patient/Family verbalized understanding of follow-up arrangements: Yes  Individual responsible for coordination of the follow-up plan: EVERETTE-DAD  Confirmed correct DME delivered: Elease Hashimoto 02/11/2018    Elease Hashimoto

## 2018-02-11 NOTE — Progress Notes (Signed)
Pharmacy Antibiotic Note  Emma Martin is a 38 y.o. female admitted on 02/03/2018 with left sided weakness and found to have multiple acute strokes concern for embolic phenomenon possibly related to an infectious source. She completed a course of ceftriaxone and vancomycin on 01/29/2018 for S. pneumoniae bacteremia and meningitis. 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   Vanc trough today is therapeutic at 17.   Plan: CTX 2g IV Q12h Vancomycin 1000mg  IV Q6h Plan for abx thru 2/7  OPAT updated  Temp (24hrs), Avg:98.3 F (36.8 C), Min:98.2 F (36.8 C), Max:98.3 F (36.8 C)  Recent Labs  Lab 02/08/18 0354 02/08/18 2102 02/09/18 0341 02/09/18 1607 02/09/18 2011 02/11/18 0446 02/11/18 1136  WBC  --  1.9* 2.8*  --   --   --   --   CREATININE <0.30* <0.30* 0.41* 0.37*  --  0.35*  --   VANCOTROUGH  --   --   --  41*  --   --  17  VANCORANDOM  --   --   --   --  19  --   --     Estimated Creatinine Clearance: 73.4 mL/min (A) (by C-G formula based on SCr of 0.35 mg/dL (L)).    Allergies  Allergen Reactions  . Benzoin Rash   Thank you for allowing pharmacy to be a part of this patient's care.  Elenor Quinones, PharmD, BCPS, [facility] Clinical Pharmacist Phone number [phone] 02/11/2018 12:41 PM

## 2018-02-12 ENCOUNTER — Inpatient Hospital Stay (HOSPITAL_COMMUNITY): Payer: Self-pay | Admitting: Physical Therapy

## 2018-02-12 ENCOUNTER — Inpatient Hospital Stay (HOSPITAL_COMMUNITY): Payer: Self-pay | Admitting: Occupational Therapy

## 2018-02-12 LAB — BASIC METABOLIC PANEL
Anion gap: 13 (ref 5–15)
BUN: 6 mg/dL (ref 6–20)
CO2: 26 mmol/L (ref 22–32)
Calcium: 8.4 mg/dL — ABNORMAL LOW (ref 8.9–10.3)
Chloride: 94 mmol/L — ABNORMAL LOW (ref 98–111)
Creatinine, Ser: 0.36 mg/dL — ABNORMAL LOW (ref 0.44–1.00)
GFR calc Af Amer: >60 mL/min (ref >60)
GFR calc non Af Amer: >60 mL/min (ref >60)
Glucose, Bld: 111 mg/dL — ABNORMAL HIGH (ref 70–99)
Potassium: 3 mmol/L — ABNORMAL LOW (ref 3.5–5.1)
SODIUM: 133 mmol/L — AB (ref 135–145)

## 2018-02-12 MED ORDER — POTASSIUM CHLORIDE CRYS ER 20 MEQ PO TBCR: 40.0000 meq | EXTENDED_RELEASE_TABLET | Freq: Three times a day (TID) | ORAL | Status: DC

## 2018-02-12 MED ORDER — POTASSIUM CHLORIDE CRYS ER 20 MEQ PO TBCR: 40.0000 meq | EXTENDED_RELEASE_TABLET | Freq: Three times a day (TID) | ORAL | 0 refills | Status: DC

## 2018-02-12 MED ORDER — HEPARIN SOD (PORK) LOCK FLUSH 100 UNIT/ML IV SOLN: 250.0000 [IU] | INTRAVENOUS | Status: DC | PRN

## 2018-02-12 MED ORDER — HEPARIN SOD (PORK) LOCK FLUSH 100 UNIT/ML IV SOLN
250.0000 [IU] | INTRAVENOUS | Status: AC | PRN
  Administered 2018-02-12: 250 [IU]

## 2018-02-12 NOTE — Progress Notes (Signed)
Occupational Therapy Session Note  Patient Details  Name: Emma Martin MRN: [id number] Date of Birth: 1980-10-02  Today's Date: 02/12/2018 OT Individual Time: 0930-1030 OT Individual Time Calculation (min): 60 min   Skilled Therapeutic Interventions/Progress Updates:    1:1 Pt asleep when arrived. Self care retraining at shower level. Pt had been incontinent of urine. Pt ambulated to bathroom with close supervision and transitioned on and off the toilet with supervison. Pt required mod A for hygiene due to incontinence and to cleansed after BM.  PICC line wrapped and HHA transitioned into the shower. Pt required on setup / cues to bathe.  Did perform hygiene on buttocks to ensure cleanliness after BM.  Pt dressed with cues for initiation and close supervision for standing.  Pt's father present for session and continued to discuss d/c safety with pt and timed toileting.   Therapy Documentation Precautions:  Precautions Precautions: Fall Precaution Comments: Deaf Restrictions Weight Bearing Restrictions: No Pain:  no c/o pain   Therapy/Group: Individual Therapy  Willeen Cass [facility] 02/12/2018, 2:44 PM

## 2018-02-12 NOTE — Progress Notes (Signed)
Patient discharged to home, accompanied by her father

## 2018-02-12 NOTE — Progress Notes (Signed)
Occupational Therapy Discharge Summary  Patient Details  Name: Emma Martin MRN: [id number] Date of Birth: 06-24-1980  Patient has met 11 of 11 long term goals due to improved activity tolerance, improved balance, postural control, ability to compensate for deficits and improved awareness.  Patient to discharge at overall Supervision to Contact guard level.  Patient's care partner is independent to provide the necessary physical and cognitive assistance at discharge.  Patient's father has been present and involved in pt's therapy sessions.  He has verbalized understanding regarding safety and toileting issues.  Reasons goals not met: N/A  Recommendation:  Patient will benefit from ongoing skilled OT services in home health setting to continue to advance functional skills in the area of BADL.  Equipment: No equipment provided  Reasons for discharge: treatment goals met and discharge from hospital  Patient/family agrees with progress made and goals achieved: Yes  OT Discharge Precautions/Restrictions  Precautions Precautions: Fall Precaution Comments: Deaf ADL ADL Eating: Modified independent Where Assessed-Eating: Chair Grooming: Supervision/safety Where Assessed-Grooming: Standing at sink Upper Body Bathing: Supervision/safety Where Assessed-Upper Body Bathing: Shower Lower Body Bathing: Supervision/safety Where Assessed-Lower Body Bathing: Shower Upper Body Dressing: Supervision/safety Where Assessed-Upper Body Dressing: Sitting at sink Lower Body Dressing: Supervision/safety Where Assessed-Lower Body Dressing: Sitting at sink, Standing at sink Toileting: Supervision/safety Where Assessed-Toileting: Glass blower/designer: Close supervision Armed forces technical officer Method: Counselling psychologist: Raised toilet seat, Grab bars(RW) Social research officer, government: Close supervision Social research officer, government Method: Heritage manager: Civil engineer, contracting with back,  Grab bars Vision Baseline Vision/History: Wears glasses Wears Glasses: At all times Patient Visual Report: No change from baseline Vision Assessment?: Vision impaired- to be further tested in functional context Additional Comments: Mild Lt inattention persists.  Pt is able to track to Lt visual field with cues Perception  Perception: Impaired Comments: inattention to Lt environment Cognition Overall Cognitive Status: Impaired/Different from baseline Arousal/Alertness: Awake/alert Orientation Level: Oriented to person;Oriented to place Attention: Selective Sustained Attention: Impaired Selective Attention: Impaired Memory: Impaired Awareness: Impaired Problem Solving: Impaired Safety/Judgment: Impaired Sensation Sensation Light Touch: Appears Intact Proprioception: Appears Intact Coordination Gross Motor Movements are Fluid and Coordinated: Yes Extremity/Trunk Assessment RUE Assessment RUE Assessment: Within Functional Limits General Strength Comments: strength grossly 4/5 LUE Assessment LUE Assessment: Within Functional Limits General Strength Comments: strength grossly 4/5   Emma Martin 02/12/2018, 6:12 PM

## 2018-02-15 ENCOUNTER — Telehealth: Payer: Self-pay | Admitting: Registered Nurse

## 2018-02-15 NOTE — Telephone Encounter (Signed)
Transitional Care call Transitional Care Call Completed Transitional Care Call Questions answered by Mr. Henckel ( Father)   Patient name: Emma Martin DOB: 06-03-1980 1. Are you/is patient experiencing any problems since coming home? Yes, Mr. Holecek reports [name] was having problems with taking the potassium supplement upon coming home. She experienced Nausea and Vomiting. Since 1/25, she is tolerating the potassium supplement Mr. Butler reports.  a. Are there any questions regarding any aspect of care? See above. Mr. Seubert reports when nurse came out  Britney blood pressure was elevated, he spoke with his PCP and they resume the Lisinopril he states 1/2 tablet a day. Since the lisinopril was resumed her blood pressure has been  Controlled he reports.  2. Are there any questions regarding medications administration/dosing? No a. Are meds being taken as prescribed? Yes, except potassium supplement.  b. "Patient should review meds with caller to confirm" Medication List reviewed.  3. Have there been any falls? No 4. Has Home Health been to the house and/or have they contacted you? Yes, [facility]. [name] was suppose to have a BMET profile drawn today, placed a call to [facility] gave orders, the nurse 5.  Margarita Grizzle) reports no order was placed. This provider gave order for BMET, they are scheduled to come to their home in the morning. Spoke with [name] regarding the above and he verbalizes understanding.  a. If not, have you tried to contact them? NA b. Can we help you contact them? NA 6. Are bowels and bladder emptying properly? Yes a. Are there any unexpected incontinence issues? No b. If applicable, is patient following bowel/bladder programs? NA 7. Any fevers, problems with breathing, unexpected pain? No 8. Are there any skin problems or new areas of breakdown? No 9. Has the patient/family member arranged specialty MD follow up (ie  cardiology/neurology/renal/surgical/etc.)?  [name] reports he will call to schedule HFU appointments tomorrow.  a. Can we help arrange? NA 10. Does the patient need any other services or support that we can help arrange? No 11. Are caregivers following through as expected in assisting the patient? Yes 12. Has the patient quit smoking, drinking alcohol, or using drugs as recommended? Mr. Pietila reports [name] doesn't smoke drink alcohol or use illicit drugs.   Appointment date/time 02/23/2018  arrival time 11:00 for 11:20 appointment with Danella Sensing ANP-C. At [place]

## 2018-02-16 ENCOUNTER — Other Ambulatory Visit: Payer: Self-pay | Admitting: Otolaryngology

## 2018-02-16 ENCOUNTER — Telehealth: Payer: Self-pay | Admitting: *Deleted

## 2018-02-16 DIAGNOSIS — H9193 Unspecified hearing loss, bilateral: Secondary | ICD-10-CM

## 2018-02-16 DIAGNOSIS — G009 Bacterial meningitis, unspecified: Secondary | ICD-10-CM

## 2018-02-16 NOTE — Telephone Encounter (Signed)
Dr Erik Obey would like to speak with DR Megan Salon regarding mutual patient.  Please call him at [phone] Landis Gandy, RN

## 2018-02-16 NOTE — Telephone Encounter (Signed)
I spoke to Dr. Erik Obey today about Emma Martin.  He wanted to know if I felt that he or 1 of his partners needed to evaluate Emma Martin's sinuses because of the partial opacification of her left sphenoid sinus, scattered ethmoid air cells and left maxillary sinus seen on her recent CT scan.  She is scheduled for a follow-up CT scan in a week or so.  I suggested that she complete her course of IV vancomycin and ceftriaxone and then be reevaluated here at the time of her upcoming visit once the results of the CT scan are available.

## 2018-02-16 NOTE — Telephone Encounter (Signed)
Emma Martin, she is scheduled with you 02/25/2018.

## 2018-02-17 ENCOUNTER — Telehealth: Payer: Self-pay | Admitting: *Deleted

## 2018-02-17 ENCOUNTER — Other Ambulatory Visit: Payer: Self-pay | Admitting: Otolaryngology

## 2018-02-17 DIAGNOSIS — H9193 Unspecified hearing loss, bilateral: Secondary | ICD-10-CM

## 2018-02-17 DIAGNOSIS — G009 Bacterial meningitis, unspecified: Secondary | ICD-10-CM

## 2018-02-17 NOTE — Telephone Encounter (Signed)
Manuela Schwartz OT [facility] called for 2wk 3.  Approval given.

## 2018-02-18 ENCOUNTER — Encounter: Payer: Self-pay | Admitting: Internal Medicine

## 2018-02-23 ENCOUNTER — Encounter: Payer: Medicaid Other | Attending: Registered Nurse | Admitting: Registered Nurse

## 2018-02-23 ENCOUNTER — Encounter: Payer: Self-pay | Admitting: Registered Nurse

## 2018-02-23 VITALS — BP 160/108 | HR 114 | Ht 61.0 in | Wt 112.0 lb

## 2018-02-23 DIAGNOSIS — I6381 Other cerebral infarction due to occlusion or stenosis of small artery: Secondary | ICD-10-CM

## 2018-02-23 DIAGNOSIS — Z8249 Family history of ischemic heart disease and other diseases of the circulatory system: Secondary | ICD-10-CM | POA: Insufficient documentation

## 2018-02-23 DIAGNOSIS — Z809 Family history of malignant neoplasm, unspecified: Secondary | ICD-10-CM | POA: Insufficient documentation

## 2018-02-23 DIAGNOSIS — Z8349 Family history of other endocrine, nutritional and metabolic diseases: Secondary | ICD-10-CM | POA: Diagnosis not present

## 2018-02-23 DIAGNOSIS — R7881 Bacteremia: Secondary | ICD-10-CM | POA: Diagnosis not present

## 2018-02-23 DIAGNOSIS — E785 Hyperlipidemia, unspecified: Secondary | ICD-10-CM | POA: Insufficient documentation

## 2018-02-23 DIAGNOSIS — E038 Other specified hypothyroidism: Secondary | ICD-10-CM

## 2018-02-23 DIAGNOSIS — I10 Essential (primary) hypertension: Secondary | ICD-10-CM | POA: Diagnosis not present

## 2018-02-23 DIAGNOSIS — Q231 Congenital insufficiency of aortic valve: Secondary | ICD-10-CM | POA: Diagnosis not present

## 2018-02-23 DIAGNOSIS — Q969 Turner's syndrome, unspecified: Secondary | ICD-10-CM | POA: Diagnosis not present

## 2018-02-23 DIAGNOSIS — E039 Hypothyroidism, unspecified: Secondary | ICD-10-CM | POA: Insufficient documentation

## 2018-02-23 DIAGNOSIS — I639 Cerebral infarction, unspecified: Secondary | ICD-10-CM | POA: Diagnosis not present

## 2018-02-23 DIAGNOSIS — G001 Pneumococcal meningitis: Secondary | ICD-10-CM

## 2018-02-23 DIAGNOSIS — M419 Scoliosis, unspecified: Secondary | ICD-10-CM | POA: Insufficient documentation

## 2018-02-23 DIAGNOSIS — E876 Hypokalemia: Secondary | ICD-10-CM

## 2018-02-23 DIAGNOSIS — H903 Sensorineural hearing loss, bilateral: Secondary | ICD-10-CM

## 2018-02-23 NOTE — Progress Notes (Signed)
Subjective:    Patient ID: Emma Martin, female    DOB: 1980-06-13, 38 y.o.   MRN: [id number]  HPI: Emma Martin is a 38 y.o. female who is here for transitional care visit follow up appointment. She presented to [facility] Emergency department being lethargic, with left side weakness. Code stroke was called. CT of the head and MRI was ordered.  CT Head WO Contrast:  IMPRESSION: 1. Normal CT appearance of the brain. 2. Postsurgical changes of the anterior maxillary sinuses and nasal bones. 3. Stable chronic sinus opacification. 4. ASPECTS is 10/10  CT Cerebral Perfusion W Contrast:  IMPRESSION: 1. No focal ischemia or tissue at risk.  Normal CT brain perfusion. 2. No emergent large vessel occlusion. 3. Medium and small vessel irregularity is noted within the anterior circulation. Vessels are small. Question underlying vasculitis. 4. Tortuosity of the distal cervical left ICA. There is no significant stenosis. This is commonly seen in the setting of hypertension. 5. Scoliosis.  MR Brain WO Contrast:  IMPRESSION: Scattered leptomeningeal enhancement overlying the right cerebral convexity and about the perimesencephalic and supra cerebellar cisterns, most consistent with meningitis. Associated patchy post-contrast enhancement within the bilateral cerebral hemispheres and cerebellum as above, compatible with associated cerebritis. No frank intracerebral abscess or empyema at this time.  [name] was recently hospitalized for streptococcal meningitis, she had finished her antibiotic treatment. She also developed subsequent hearing loss bilaterally.  She was admitted to [facility] on 02/03/2018 and discharged on 02/12/2018. During her stay at [facility] she developed hypokalemia and currently on potassium supplement. This provider placed a call to [facility] regarging potassium level on January 30th her K+ level was 4.4, she had K+  level drawn on 02/22/2018, results pending. Dr. Baxter Flattery following lab work results.   She presented today for transitional care follow up of her right thalamic infarction, pneumococcal bacteremia/ meningitis, sensorineural hearing loss of both ears, HTN, hypokalemia, hypothyroidism, hypomagnesemia, dyslipidemia and Turner Syndrome. She arrived hypertensive, [name] ( father) and [name] reports she's  compliant with her antihypertensive medication. Blood pressure re-checked, they refused ED evaluation. Mr. Barona states [name] is due for her anti-hypertensive medication. Instructed to keep blood pressure log, he verbalizes understanding.   [name] denies pain and reports good appetite.   Communicated with [name] using white board.  [name] arrived in wheelchair.   [name] in room.   Pain Inventory Average Pain 3 Pain Right Now 3 My pain is na  In the last 24 hours, has pain interfered with the following? General activity 0 Relation with others 0 Enjoyment of life 0 What TIME of day is your pain at its worst? na Sleep (in general) Fair  Pain is worse with: sitting Pain improves with: medication Relief from Meds: na  Mobility walk with assistance use a walker do you drive?  no  Function not employed: date last employed 2019  Neuro/Psych bladder control problems anxiety  Prior Studies Any changes since last visit?  no  Physicians involved in your care Any changes since last visit?  no   Family History  Problem Relation Age of Onset  . Other Father        A + W  . Cancer Maternal Grandmother   . Cancer Paternal Grandfather   . Hypertension Mother   . Other Mother        Dyslipidemia  . Hypothyroidism Mother   . Mitral valve prolapse Mother  Social History   Socioeconomic History  . Marital status: Single    Spouse name: Not on file  . Number of children: Not on file  . Years of education: Not on file    . Highest education level: Not on file  Occupational History  . Not on file  Social Needs  . Financial resource strain: Not on file  . Food insecurity:    Worry: Not on file    Inability: Not on file  . Transportation needs:    Medical: Not on file    Non-medical: Not on file  Tobacco Use  . Smoking status: Never Smoker  . Smokeless tobacco: Never Used  Substance and Sexual Activity  . Alcohol use: Yes    Alcohol/week: 1.0 - 2.0 standard drinks    Types: 1 - 2 Glasses of wine per week    Comment: Occasionally  . Drug use: No  . Sexual activity: Not on file  Lifestyle  . Physical activity:    Days per week: Not on file    Minutes per session: Not on file  . Stress: Not on file  Relationships  . Social connections:    Talks on phone: Not on file    Gets together: Not on file    Attends religious service: Not on file    Active member of club or organization: Not on file    Attends meetings of clubs or organizations: Not on file    Relationship status: Not on file  Other Topics Concern  . Not on file  Social History Narrative  . Not on file   Past Surgical History:  Procedure Laterality Date  . TEE WITHOUT CARDIOVERSION N/A 01/19/2018   Procedure: TRANSESOPHAGEAL ECHOCARDIOGRAM (TEE);  Surgeon: Jerline Pain, MD;  Location: [facility] ENDOSCOPY;  Service: Cardiovascular;  Laterality: N/A;  . TEE WITHOUT CARDIOVERSION N/A 02/02/2018   Procedure: TRANSESOPHAGEAL ECHOCARDIOGRAM (TEE);  Surgeon: Elouise Munroe, MD;  Location: [place];  Service: Cardiology;  Laterality: N/A;   Past Medical History:  Diagnosis Date  . Bicuspid aortic valve    , mild aortic insufficiency. (No SBE prophylaxis needed)  . Complication of anesthesia   . Dyslipidemia   . Generalized anxiety disorder    , with obsessive-compulsive traits.  . Hypertension   . Hypothyroidism   . Mixed gonadal dysgenesis    , (additional Y-bearing cell line) for which she reportedly underwent bilateral  gonadectomy (patient unaware; needs confirmation) due to malignancy potential.  . PONV (postoperative nausea and vomiting)   . Primary amenorrhea    , treated starting at age 40 or 61 with estrogen and progesterone  . Scoliosis    , treated with Harrington rods (1997) for stabilization.  . Seasonal allergic rhinitis   . Short stature    , previously treated with growth hormone age 55-15 years.  Radford Pax syndrome    , previously followed by pediatric endocrinologist Freddy Jaksch, MD at [facility] through her early 59s).   There were no vitals taken for this visit.  Opioid Risk Score:   Fall Risk Score:  `1  Depression screen PHQ 2/9  No flowsheet data found.   Review of Systems  Constitutional: Negative.   HENT: Negative.   Eyes: Negative.   Respiratory: Negative.   Cardiovascular: Negative.   Gastrointestinal: Negative.   Endocrine: Negative.   Genitourinary: Positive for difficulty urinating.  Musculoskeletal: Positive for gait problem.  Skin: Negative.   Allergic/Immunologic: Negative.   Neurological: Positive for headaches.  Hematological: Negative.   Psychiatric/Behavioral: Negative.   All other systems reviewed and are negative.      Objective:   Physical Exam Vitals signs and nursing note reviewed.  Constitutional:      Appearance: Normal appearance.  Neck:     Musculoskeletal: Normal range of motion and neck supple.  Cardiovascular:     Rate and Rhythm: Normal rate and regular rhythm.     Pulses: Normal pulses.     Heart sounds: Normal heart sounds.  Pulmonary:     Effort: Pulmonary effort is normal.     Breath sounds: Normal breath sounds.  Musculoskeletal:     Comments: Normal Muscle Bulk and Muscle Testing Reveals:  Upper Extremities: Full ROM and Muscle Strength 5/5 Right PICC Line Lower Extremities: Full ROM and Muscle Strength 5/5 Arrived in wheelchair   Skin:    General: Skin is warm and dry.  Neurological:     Mental Status: She  is alert and oriented to person, place, and time.  Psychiatric:        Mood and Affect: Mood normal.        Behavior: Behavior normal.           Assessment & Plan:  1. Right Thalamic Infarction: Continue with Home Health Therapies with [facility]. Mr. Bean will call for Neurology HFU appointment.  2. Pneumococcal bacteremia/ meningitis:Continue IV Antibiotics nfectious Disease Following.  3.Sensorineural Hearing Loss of Both Ears: ENT Following.  4. Hypertension: Continue current medication/ Uncontrolled Hypertension: Refuses ED evaluation. Educated on keeping a blood pressure log. Call PCP with blood pressures. He verbalizes understanding.  5. Dyslipidemia: Continue current medication regime. PCP Following. 6. Hypothyroidism: Continue current medication regime 7. Hypokalemia: Continue current medication regimen: Dr. Baxter Flattery following.  8. Hypomagnesemia: Continue current medication regimen. PCP Following.  9. Turner Syndrome: PCP Following.   30 minutes  of face to face patient care time was spent during this visit. All questions were encouraged and answered.  F/U with Dr. Letta Pate in 4- 6 weeks

## 2018-02-23 NOTE — Patient Instructions (Signed)
Call [facility] for Appointment:   Address: [address] [place], [place] [address]    Phone: [phone]

## 2018-02-24 ENCOUNTER — Ambulatory Visit (HOSPITAL_COMMUNITY): Payer: Self-pay

## 2018-02-24 ENCOUNTER — Encounter (HOSPITAL_COMMUNITY): Payer: Self-pay

## 2018-02-24 ENCOUNTER — Ambulatory Visit (HOSPITAL_COMMUNITY)
Admission: RE | Admit: 2018-02-24 | Discharge: 2018-02-24 | Disposition: A | Discharge status: Home / Self Care | Payer: Medicaid Other | Source: Ambulatory Visit | Attending: Otolaryngology | Admitting: Otolaryngology

## 2018-02-24 DIAGNOSIS — H9193 Unspecified hearing loss, bilateral: Secondary | ICD-10-CM | POA: Diagnosis not present

## 2018-02-24 DIAGNOSIS — G009 Bacterial meningitis, unspecified: Secondary | ICD-10-CM | POA: Diagnosis present

## 2018-02-24 NOTE — Progress Notes (Signed)
Patient: Emma Martin  DOB: May 14, 1980 MRN: [id number] PCP: Maurice Small, MD   Patient Active Problem List   Diagnosis Date Noted  . Right thalamic infarction ([place]) 02/03/2018  . Bacteremia   . Cerebrovascular accident (CVA) ([place])   . Benign essential HTN   . Tachypnea   . Sensorineural hearing loss (SNHL) of both ears 01/31/2018  . Dyslipidemia 01/31/2018  . Acute ischemic stroke ([place]) 01/30/2018  . Pneumococcal bacteremia   . Pneumococcal meningitis   . Turner syndrome   . Hypothyroidism 01/15/2018  . Generalized anxiety disorder 01/15/2018  . Bicuspid aortic valve 04/06/2013  . HTN (hypertension) 04/06/2013     Subjective:  Emma Martin is a 38 y.o. F with HTN, hypothyroidism, GAD, scoliosis with harrington rod placements, mixed gonadal dysgenesis, bicuspid AV  Hospitalized 01-16-18 with 1 week h/o headache, neck pain, nightsweats and n/v. L obtained with concern for meningitis - CSF profile revealed mild pleocytosis with neutrophilic predominance, low glucose and high protein. OP of 20. wBC of 44 with 89%N, TP 296, glu <20 and gram stain showing GPC. Blood cultures revealed growth < 12 hours of what was identified to be streptococcus pneumoniae. She was started on Vancomycin + Ceftriaxone and dexamethasone. Unfortunately she has developed complete sensorineural hearing loss. TEE 01/19/18 which revealed no acute valvular abnormalities. Original plan to treat for 14d through 01/29/18 however she has required several additional readmissions to the hospital and concern for septic emboli from endocarditis was raised. She was seen by Dr. Megan Salon during readmission d/t lethargy and left sided weakness on 1/12 when MRI revealed residual leptomeningeal enhancement and small foci of cerebritis - concern over embolic strokes that may have been related to endocarditis that was missed on initial TEE.   Pneumococcal isolate with intermediate resistance to ceftriaxone - she was  treated based on guidelines with dual therapy vancomycin + ceftriaxone for 6 weeks which is due to complete tomorrow 02/26/18.   She was referred to Dr. Erik Obey to evaluate hearing loss - tympanograms normal b/l. Pure-tine audiometry demonstrated no response. Plan to refer to [facility] asap for consideration of cochlea implant. CT revealed opacification of riht mastoid tip with sclerosis (?chronic otomastoiditis), paranasal sinus disease with left maxillary sinus fluid level that may represent acute sinusitis.   HPI: [name] and her father provide the history today - she is able to read my lips well and converse easily w/o interpretation.   They are infusing vancomycin + ceftriaxone at home both twice a day. Although this has been taxing to her father they have been able to get in all doses. He tells me that they have had to increase her vancomycin dose several times.   Halia is still having headaches that are described to be frontal and some neck pain. No fevers and nowhere near the severity as before. She denies any sinus pain/pressure or dental pain. Her father is worried about urinary incontinence which is new and loose bowel movements. She is scheduled for an MR of the brain W/ and W/O contrast tomorrow.   Review of Systems  Constitutional: Negative for fever, malaise/fatigue and weight loss.  HENT: Positive for hearing loss. Negative for congestion, ear pain, sinus pain and sore throat.   Eyes: Negative for blurred vision.  Respiratory: Negative for cough.   Cardiovascular: Negative for chest pain.  Gastrointestinal: Negative for abdominal pain, nausea and vomiting. Diarrhea: loose.  Genitourinary:       Incontinance  Musculoskeletal: Positive for neck pain. Negative  for myalgias.  Skin: Negative for rash.  Neurological: Positive for headaches. Negative for weakness.  Psychiatric/Behavioral: The patient is nervous/anxious.     Past Medical History:  Diagnosis Date  . Bicuspid aortic valve     , mild aortic insufficiency. (No SBE prophylaxis needed)  . Complication of anesthesia   . Dyslipidemia   . Generalized anxiety disorder    , with obsessive-compulsive traits.  . Hypertension   . Hypothyroidism   . Mixed gonadal dysgenesis    , (additional Y-bearing cell line) for which she reportedly underwent bilateral gonadectomy (patient unaware; needs confirmation) due to malignancy potential.  . PONV (postoperative nausea and vomiting)   . Primary amenorrhea    , treated starting at age 89 or 40 with estrogen and progesterone  . Scoliosis    , treated with Harrington rods (1997) for stabilization.  . Seasonal allergic rhinitis   . Short stature    , previously treated with growth hormone age 72-15 years.  Radford Pax syndrome    , previously followed by pediatric endocrinologist Freddy Jaksch, MD at [facility] through her early 84s).    Outpatient Medications Prior to Visit  Medication Sig Dispense Refill  . acetaminophen (TYLENOL) 325 MG tablet Take 2 tablets (650 mg total) by mouth every 4 (four) hours as needed for mild pain (or temp > 37.5 C (99.5 F)).    Marland Kitchen atorvastatin (LIPITOR) 20 MG tablet Take 1 tablet (20 mg total) by mouth daily at 6 PM. 30 tablet 1  . cefTRIAXone (ROCEPHIN) IVPB Inject 2 g into the vein every 12 (twelve) hours. Indication: Meningitis Last Day of Therapy: 02/26/2018 Labs - Once weekly:  CBC/D and BMP, Labs - Every other week:  ESR and CRP 32 Units 0  . FLUoxetine (PROZAC) 40 MG capsule Take 1 capsule (40 mg total) by mouth daily. 30 capsule 3  . fluticasone (FLONASE) 50 MCG/ACT nasal spray Place 2 sprays into both nostrils daily.    Marland Kitchen levothyroxine (SYNTHROID, LEVOTHROID) 75 MCG tablet Take 1 tablet (75 mcg total) by mouth daily before breakfast. 30 tablet 0  . lisinopril (PRINIVIL,ZESTRIL) 10 MG tablet Take 10 mg by mouth daily.    . magnesium oxide (MAG-OX) 400 (241.3 Mg) MG tablet Take 1 tablet (400 mg total) by mouth 2 (two) times daily. 60  tablet 0  . Multiple Vitamins-Minerals (CENTRUM PO) Take 1 tablet by mouth daily.    . potassium chloride SA (K-DUR,KLOR-CON) 20 MEQ tablet Take 2 tablets (40 mEq total) by mouth 3 (three) times daily. 90 tablet 0  . vancomycin IVPB Inject 1,000 mg into the vein every 6 (six) hours. Indication: Meningitis Last Day of Therapy: 02/26/2018 Labs - Sunday/Monday:  CBC/D, BMP, and vancomycin trough. Labs - Thursday:  BMP and vancomycin trough Labs - Every other week:  ESR and CRP 64 Units 0   No facility-administered medications prior to visit.      Allergies  Allergen Reactions  . Benzoin Rash    Social History   Tobacco Use  . Smoking status: Never Smoker  . Smokeless tobacco: Never Used  Substance Use Topics  . Alcohol use: Yes    Alcohol/week: 1.0 - 2.0 standard drinks    Types: 1 - 2 Glasses of wine per week    Comment: Occasionally  . Drug use: No    Family History  Problem Relation Age of Onset  . Other Father        A + W  . Cancer Maternal  Grandmother   . Cancer Paternal Grandfather   . Hypertension Mother   . Other Mother        Dyslipidemia  . Hypothyroidism Mother   . Mitral valve prolapse Mother     Objective:   Vitals:   02/25/18 1005  BP: (!) 139/93  Pulse: (!) 120  Temp: (!) 97.4 F (36.3 C)   There is no height or weight on file to calculate BMI.  Physical Exam Vitals signs reviewed.  Constitutional:      Appearance: Normal appearance. She is not ill-appearing.     Comments: Seated in wheelchair. Accompanied by her father. Appears anxious/mildly uncomfortable.  HENT:     Nose: Nose normal. No congestion.     Mouth/Throat:     Mouth: Mucous membranes are moist.     Pharynx: Oropharynx is clear.  Eyes:     General: No scleral icterus.    Extraocular Movements: Extraocular movements intact.     Pupils: Pupils are equal, round, and reactive to light.  Neck:     Musculoskeletal: Normal range of motion. No neck rigidity.  Cardiovascular:      Rate and Rhythm: Regular rhythm. Tachycardia present.     Pulses: Normal pulses.     Heart sounds: No murmur.  Pulmonary:     Effort: Pulmonary effort is normal.     Breath sounds: Normal breath sounds.  Abdominal:     General: Abdomen is flat. There is no distension.     Tenderness: There is no abdominal tenderness.  Musculoskeletal:     Comments: RUE PICC line - clean/dry dressing. Insertion site w/o erythema, tenderness, drainage, cording or distal swelling of affected extremity    Lymphadenopathy:     Cervical: No cervical adenopathy.  Skin:    General: Skin is warm and dry.  Neurological:     Mental Status: She is alert and oriented to person, place, and time.     Sensory: Sensory deficit (deaf) present.     Comments: Residual L sided weakness from CVA. 4+/5+ left to right grip strength     Lab Results: Lab Results  Component Value Date   WBC 2.8 (L) 02/09/2018   HGB 12.0 02/09/2018   HCT 36.6 02/09/2018   MCV 83.2 02/09/2018   PLT 215 02/09/2018    Lab Results  Component Value Date   CREATININE 0.36 (L) 02/12/2018   BUN 6 02/12/2018   NA 133 (L) 02/12/2018   K 3.0 (L) 02/12/2018   CL 94 (L) 02/12/2018   CO2 26 02/12/2018    Lab Results  Component Value Date   ALT 142 (H) 02/04/2018   AST 34 02/04/2018   ALKPHOS 137 (H) 02/04/2018   BILITOT 0.6 02/04/2018     Assessment & Plan:   Problem List Items Addressed This Visit      Unprioritized   Cerebrovascular accident (CVA) ([place])    She is recovering well and working with physical therapy that her father feels is improving not only her strength but her mood as well.       Pneumococcal bacteremia    She is not ill-appearing on exam today. Tachycardic/hypertensive but also quite anxious.  Treated for presumed endocarditis involvement.       Pneumococcal meningitis - Primary    MRI tomorrow in preparation for cochlear implants. With intermediate MIC to ceftriaxone and continuous subtherapeutic levels of  vancomycin (troughs 4-<10) will wait for report to determine further steps. I explained to them that there is a  possibility that we need to consider a switch of her medications.       Sensorineural hearing loss (SNHL) of both ears    W/U in process with Dr. Erik Obey with referral to [facility] for consideration of cochlear implants.         Keep follow up appt with Dr. Megan Salon next week to reassess.   Janene Madeira, MSN, NP-C [facility] [place] Pager: [phone] Office: [phone]  02/25/18  11:08 AM

## 2018-02-25 ENCOUNTER — Ambulatory Visit (INDEPENDENT_AMBULATORY_CARE_PROVIDER_SITE_OTHER): Payer: Medicaid Other | Admitting: Infectious Diseases

## 2018-02-25 ENCOUNTER — Encounter: Payer: Self-pay | Admitting: Infectious Diseases

## 2018-02-25 ENCOUNTER — Telehealth: Payer: Self-pay

## 2018-02-25 VITALS — BP 139/93 | HR 120 | Temp 97.4°F

## 2018-02-25 DIAGNOSIS — E039 Hypothyroidism, unspecified: Secondary | ICD-10-CM

## 2018-02-25 DIAGNOSIS — R197 Diarrhea, unspecified: Secondary | ICD-10-CM

## 2018-02-25 DIAGNOSIS — F411 Generalized anxiety disorder: Secondary | ICD-10-CM

## 2018-02-25 DIAGNOSIS — I10 Essential (primary) hypertension: Secondary | ICD-10-CM | POA: Diagnosis not present

## 2018-02-25 DIAGNOSIS — G001 Pneumococcal meningitis: Secondary | ICD-10-CM | POA: Diagnosis not present

## 2018-02-25 DIAGNOSIS — I69354 Hemiplegia and hemiparesis following cerebral infarction affecting left non-dominant side: Secondary | ICD-10-CM | POA: Diagnosis not present

## 2018-02-25 DIAGNOSIS — Z95828 Presence of other vascular implants and grafts: Secondary | ICD-10-CM

## 2018-02-25 DIAGNOSIS — H903 Sensorineural hearing loss, bilateral: Secondary | ICD-10-CM | POA: Diagnosis not present

## 2018-02-25 DIAGNOSIS — M419 Scoliosis, unspecified: Secondary | ICD-10-CM | POA: Diagnosis not present

## 2018-02-25 DIAGNOSIS — Z91048 Other nonmedicinal substance allergy status: Secondary | ICD-10-CM

## 2018-02-25 DIAGNOSIS — B953 Streptococcus pneumoniae as the cause of diseases classified elsewhere: Secondary | ICD-10-CM | POA: Diagnosis not present

## 2018-02-25 DIAGNOSIS — Q231 Congenital insufficiency of aortic valve: Secondary | ICD-10-CM | POA: Diagnosis not present

## 2018-02-25 DIAGNOSIS — M542 Cervicalgia: Secondary | ICD-10-CM | POA: Diagnosis not present

## 2018-02-25 DIAGNOSIS — R7881 Bacteremia: Secondary | ICD-10-CM | POA: Diagnosis not present

## 2018-02-25 DIAGNOSIS — I639 Cerebral infarction, unspecified: Secondary | ICD-10-CM

## 2018-02-25 DIAGNOSIS — R32 Unspecified urinary incontinence: Secondary | ICD-10-CM | POA: Diagnosis not present

## 2018-02-25 DIAGNOSIS — J329 Chronic sinusitis, unspecified: Secondary | ICD-10-CM

## 2018-02-25 DIAGNOSIS — Q969 Turner's syndrome, unspecified: Secondary | ICD-10-CM | POA: Diagnosis not present

## 2018-02-25 DIAGNOSIS — R51 Headache: Secondary | ICD-10-CM

## 2018-02-25 NOTE — Assessment & Plan Note (Signed)
W/U in process with Dr. Erik Obey with referral to [facility] for consideration of cochlear implants.

## 2018-02-25 NOTE — Assessment & Plan Note (Signed)
She is recovering well and working with physical therapy that her father feels is improving not only her strength but her mood as well.

## 2018-02-25 NOTE — Telephone Encounter (Signed)
Called [facility], spoke to [place], to not pull picc tomorrow Per Janene Madeira NP. She stated that no orders have been given to pull picc prior to my phone call. Told Debbie we are to contact with further instructions tomorrow.  Debbie verbalized understanding with feedback.

## 2018-02-25 NOTE — Patient Instructions (Signed)
Nice to meet you today.   I think we are seeing some good improvements in treatment but I would like to wait for your MRI result to determine how things look.   Will continue antibiotics until we have the report from study tomorrow and Discuss further with Dr. Megan Salon.   Please keep your appointment for next week with Dr. Megan Salon for now.

## 2018-02-25 NOTE — Assessment & Plan Note (Signed)
She is not ill-appearing on exam today. Tachycardic/hypertensive but also quite anxious.  Treated for presumed endocarditis involvement.

## 2018-02-25 NOTE — Assessment & Plan Note (Addendum)
Sinusitis on recent CT scans despite antibiotic therapy presently - clinically has no symptoms on exam or with report. Hard to know if this is chronic or source of her cerebritis/meningitis/bacteremia.  She is scheduled for an MRI tomorrow for preparation of cochlear implants. With intermediate MIC to ceftriaxone and continuous subtherapeutic levels of vancomycin (troughs 4-<10) will wait for report to determine further steps. Will keep PICC in place for now until report returns.  I explained to them that there is a possibility that we need to consider a switch of her medications which was understandably disappointing to them. Will see what MRI reveals and decide on next steps.

## 2018-02-26 ENCOUNTER — Telehealth: Payer: Self-pay | Admitting: Pharmacist

## 2018-02-26 ENCOUNTER — Ambulatory Visit (HOSPITAL_COMMUNITY)
Admission: RE | Admit: 2018-02-26 | Discharge: 2018-02-26 | Disposition: A | Discharge status: Home / Self Care | Payer: Medicaid Other | Source: Ambulatory Visit | Attending: Otolaryngology | Admitting: Otolaryngology

## 2018-02-26 DIAGNOSIS — H9193 Unspecified hearing loss, bilateral: Secondary | ICD-10-CM | POA: Diagnosis not present

## 2018-02-26 DIAGNOSIS — G009 Bacterial meningitis, unspecified: Secondary | ICD-10-CM | POA: Diagnosis present

## 2018-02-26 MED ORDER — GADOBUTROL 1 MMOL/ML IV SOLN
7.5000 mL | Freq: Once | INTRAVENOUS | Status: AC | PRN
  Administered 2018-02-26: 5 mL via INTRAVENOUS

## 2018-02-26 NOTE — Telephone Encounter (Signed)
Called [facility] to see about patient's vancomycin troughs. Pharmacist, Jeani Hawking will call me back around 10am.

## 2018-02-26 NOTE — Telephone Encounter (Signed)
Thank you for the update - I discussed with her dad yesterday that sometimes with young patients that have young kidneys we can have a hard time getting doses therapeutic. Will see what her MRI read is today to determine what we should do next.  She will see Dr. Megan Salon next week to follow up.

## 2018-02-26 NOTE — Telephone Encounter (Signed)
Spoke with Jeani Hawking at [facility] regarding patient's vancomycin.  Patient left the hospital and came to [facility] on vancomycin 1250mg  IV q8h.  The first set of labs on 1/28 had a VT of 9.2.  Lynn increased the dose to 1500 mg IV q8h on 1/29.  VT was 6 on 1/30 and 10.9 on 2/3.  Jeani Hawking called Dr. Megan Salon yesterday to explain.  Patient has been getting ~4500 mg per day and is 50.8 kg. This is really the max dose that Jeani Hawking and I feel comfortable giving at home.  Jeani Hawking feels as if the patient is being compliant with her doses at home.  Will send to [facility] for assessment and next steps.

## 2018-03-01 ENCOUNTER — Telehealth: Payer: Self-pay | Admitting: *Deleted

## 2018-03-01 NOTE — Telephone Encounter (Signed)
Relayed to Royal at [facility].

## 2018-03-01 NOTE — Telephone Encounter (Signed)
Anasia called for orders to dc PICC line.  I have directed her to call the infectious disease MD ordering the antibiotics.

## 2018-03-01 NOTE — Telephone Encounter (Signed)
Would like to continue PICC care and maintenance until her appointment with Dr. Megan Salon this week - happy to sign orders to keep this up.

## 2018-03-01 NOTE — Telephone Encounter (Signed)
Emma Martin Emma Martin, called stating the patient's iv antibiotics ended Friday after her MRI. She no longer has saline flushes for her PICC, but has heparin locks.  [facility] needs orders to continue antibiotics or PICC care.  Patient is following up 2/12 with Dr Megan Salon.  Please advise. Landis Gandy, RN

## 2018-03-03 ENCOUNTER — Ambulatory Visit (INDEPENDENT_AMBULATORY_CARE_PROVIDER_SITE_OTHER): Payer: Medicaid Other | Admitting: Internal Medicine

## 2018-03-03 ENCOUNTER — Encounter: Payer: Self-pay | Admitting: Internal Medicine

## 2018-03-03 DIAGNOSIS — Q231 Congenital insufficiency of aortic valve: Secondary | ICD-10-CM

## 2018-03-03 DIAGNOSIS — J328 Other chronic sinusitis: Secondary | ICD-10-CM

## 2018-03-03 DIAGNOSIS — G001 Pneumococcal meningitis: Secondary | ICD-10-CM

## 2018-03-03 DIAGNOSIS — H903 Sensorineural hearing loss, bilateral: Secondary | ICD-10-CM

## 2018-03-03 DIAGNOSIS — Z09 Encounter for follow-up examination after completed treatment for conditions other than malignant neoplasm: Secondary | ICD-10-CM

## 2018-03-03 DIAGNOSIS — R51 Headache: Secondary | ICD-10-CM

## 2018-03-03 DIAGNOSIS — M419 Scoliosis, unspecified: Secondary | ICD-10-CM

## 2018-03-03 DIAGNOSIS — Z91048 Other nonmedicinal substance allergy status: Secondary | ICD-10-CM

## 2018-03-03 DIAGNOSIS — Z95828 Presence of other vascular implants and grafts: Secondary | ICD-10-CM | POA: Diagnosis not present

## 2018-03-03 DIAGNOSIS — Z8619 Personal history of other infectious and parasitic diseases: Secondary | ICD-10-CM | POA: Diagnosis not present

## 2018-03-03 DIAGNOSIS — Q969 Turner's syndrome, unspecified: Secondary | ICD-10-CM

## 2018-03-03 DIAGNOSIS — J0140 Acute pansinusitis, unspecified: Secondary | ICD-10-CM

## 2018-03-03 DIAGNOSIS — Z8661 Personal history of infections of the central nervous system: Secondary | ICD-10-CM

## 2018-03-03 DIAGNOSIS — J329 Chronic sinusitis, unspecified: Secondary | ICD-10-CM | POA: Insufficient documentation

## 2018-03-03 NOTE — Assessment & Plan Note (Addendum)
Recent scans show persistent, partial opacification of her mastoid, ethmoid, sphenoid and maxillary sinuses.  Ear and sinus infections are more common in people with Turner syndrome.  I will ask Dr. Erik Obey to evaluate the need for sinus endoscopy.

## 2018-03-03 NOTE — Progress Notes (Signed)
Per verbal order from Dr Megan Salon, 35 cm Single Lumen Peripherally Inserted Central Catheter removed from right basilic, tip intact. No sutures present. PICC length per chart was 21 cm.  Dr Megan Salon notified. Dressing was clean and dry. Petroleum dressing applied, arm wrapped with guaze as she had some irritation and redness from previous PICC line dressing chagnes. Patient's family was advised no heavy lifting with this arm, leave dressing for 24 hours and call the office or seek emergent care if dressing becomes soaked with blood or swelling or sharp pain presents. Patient's family verbalized understanding and agreement, their  questions answered to their satisfaction. Patient tolerated procedure well, RN walked with her and her family to check out.  [facility] and Cassie at [facility] notified. Landis Gandy, RN

## 2018-03-03 NOTE — Progress Notes (Signed)
[facility] for Infectious Disease  Patient Active Problem List   Diagnosis Date Noted  . Sinusitis 03/03/2018    Priority: Medium  . Sensorineural hearing loss (SNHL) of both ears 01/31/2018    Priority: Medium  . Pneumococcal bacteremia     Priority: Medium  . Pneumococcal meningitis     Priority: Medium  . Bicuspid aortic valve 04/06/2013    Priority: Low  . Right thalamic infarction ([place]) 02/03/2018  . Cerebrovascular accident (CVA) ([place])   . Benign essential HTN   . Tachypnea   . Dyslipidemia 01/31/2018  . Acute ischemic stroke ([place]) 01/30/2018  . Turner syndrome   . Hypothyroidism 01/15/2018  . Generalized anxiety disorder 01/15/2018  . HTN (hypertension) 04/06/2013    Patient's Medications  New Prescriptions   No medications on file  Previous Medications   ACETAMINOPHEN (TYLENOL) 325 MG TABLET    Take 2 tablets (650 mg total) by mouth every 4 (four) hours as needed for mild pain (or temp > 37.5 C (99.5 F)).   ATORVASTATIN (LIPITOR) 20 MG TABLET    Take 1 tablet (20 mg total) by mouth daily at 6 PM.   FLUOXETINE (PROZAC) 40 MG CAPSULE    Take 1 capsule (40 mg total) by mouth daily.   FLUTICASONE (FLONASE) 50 MCG/ACT NASAL SPRAY    Place 2 sprays into both nostrils daily.   LEVOTHYROXINE (SYNTHROID, LEVOTHROID) 75 MCG TABLET    Take 1 tablet (75 mcg total) by mouth daily before breakfast.   LISINOPRIL (PRINIVIL,ZESTRIL) 10 MG TABLET    Take 10 mg by mouth daily.   MAGNESIUM OXIDE (MAG-OX) 400 (241.3 MG) MG TABLET    Take 1 tablet (400 mg total) by mouth 2 (two) times daily.   MULTIPLE VITAMINS-MINERALS (CENTRUM PO)    Take 1 tablet by mouth daily.   POTASSIUM CHLORIDE SA (K-DUR,KLOR-CON) 20 MEQ TABLET    Take 2 tablets (40 mEq total) by mouth 3 (three) times daily.  Modified Medications   No medications on file  Discontinued Medications   CEFTRIAXONE (ROCEPHIN) IVPB    Inject 2 g into the vein every 12 (twelve) hours. Indication: Meningitis Last Day  of Therapy: 02/26/2018 Labs - Once weekly:  CBC/D and BMP, Labs - Every other week:  ESR and CRP   VANCOMYCIN IVPB    Inject 1,000 mg into the vein every 6 (six) hours. Indication: Meningitis Last Day of Therapy: 02/26/2018 Labs - Sunday/Monday:  CBC/D, BMP, and vancomycin trough. Labs - Thursday:  BMP and vancomycin trough Labs - Every other week:  ESR and CRP    Subjective: Emma Martin is in for her hospital follow-up visit.  She is accompanied by her father and her sister.  She has a history of Turner syndrome, scoliosis requiring Harrington rod repair and a bicuspid aortic valve.  She was hospitalized in late December with bacteremic pneumococcal meningitis.  Her isolate was resistant to penicillin and intermediately resistant to ceftriaxone.  Therefore she was treated with both high-dose ceftriaxone and vancomycin.  Repeat blood cultures done 48 hours into therapy were negative.  A TEE done on 01/20/2019 did not reveal any evidence of endocarditis.  She received 4 days of dexamethasone therapy and the initial plan was to complete 2 weeks of IV ceftriaxone and vancomycin.  She was discharged home to complete therapy.  She had noted some partial hearing loss but this improved transiently while on steroids.  However on 01/25/2018 she had sudden, complete  hearing loss.  She was seen in the emergency department and referred to ENT.  She was going to be referred to [facility] for evaluation for possible cochlear implants but she was found to be very lethargic with left-sided weakness leading to readmission on 01/30/2018.  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  Repeat blood cultures were negative.  Repeat TEE again showed no evidence of vegetations.  I saw her and recommended extending therapy with ceftriaxone and vancomycin for 6 full weeks through 02/26/2018.  She was discharged on 02/11/2018.  Last week we became aware that there was difficulty getting her vancomycin levels up into  the therapeutic range.  Her vancomycin trough was subtherapeutic on 1/28, 1/30 and 2/3.  It finally got to the therapeutic range on maximal doses of vancomycin on 2/6, 1 day before completing therapy.  She underwent repeat brain MRI which showed persistent though improved leptomeningeal enhancement, mild, new hydrocephalus and waxing and waning cerebritis.  She is feeling much better.  She is making progress with physical therapy.  Her father states that sometimes she will seem to forget and simply stand up out of her wheelchair and walk away without using her walker.  He says that she seems to be very steady on her feet and is getting stronger every day.  She has mild, persistent headaches.  She rates them as 2 out of 10 but says that they are improving.  She will occasionally take acetaminophen.  She remains completely deaf.  Review of Systems: Review of Systems  Constitutional: Negative for chills, diaphoresis and fever.  HENT: Positive for hearing loss.   Neurological: Positive for headaches.    Past Medical History:  Diagnosis Date  . Bicuspid aortic valve    , mild aortic insufficiency. (No SBE prophylaxis needed)  . Complication of anesthesia   . Dyslipidemia   . Generalized anxiety disorder    , with obsessive-compulsive traits.  . Hypertension   . Hypothyroidism   . Mixed gonadal dysgenesis    , (additional Y-bearing cell line) for which she reportedly underwent bilateral gonadectomy (patient unaware; needs confirmation) due to malignancy potential.  . PONV (postoperative nausea and vomiting)   . Primary amenorrhea    , treated starting at age 20 or 89 with estrogen and progesterone  . Scoliosis    , treated with Harrington rods (1997) for stabilization.  . Seasonal allergic rhinitis   . Short stature    , previously treated with growth hormone age 15-15 years.  Radford Pax syndrome    , previously followed by pediatric endocrinologist Freddy Jaksch, MD at [facility]  through her early 83s).    Social History   Tobacco Use  . Smoking status: Never Smoker  . Smokeless tobacco: Never Used  Substance Use Topics  . Alcohol use: Yes    Alcohol/week: 1.0 - 2.0 standard drinks    Types: 1 - 2 Glasses of wine per week    Comment: Occasionally  . Drug use: No    Family History  Problem Relation Age of Onset  . Other Father        A + W  . Cancer Maternal Grandmother   . Cancer Paternal Grandfather   . Hypertension Mother   . Other Mother        Dyslipidemia  . Hypothyroidism Mother   . Mitral valve prolapse Mother     Allergies  Allergen Reactions  . Benzoin Rash    Objective: Vitals:   03/03/18  1040  BP: (!) 147/95  Pulse: (!) 123  Temp: 97.6 F (36.4 C)   There is no height or weight on file to calculate BMI.  Physical Exam Constitutional:      Comments: She is alert and calm seated in her wheelchair.  She easily answers written questions.  HENT:     Ears:     Comments: No mastoid tenderness. Eyes:     Conjunctiva/sclera: Conjunctivae normal.  Neck:     Musculoskeletal: Neck supple.  Cardiovascular:     Rate and Rhythm: Normal rate and regular rhythm.     Heart sounds: No murmur.  Pulmonary:     Effort: Pulmonary effort is normal.     Breath sounds: Normal breath sounds.  Skin:    Findings: No rash.     Comments: Her right arm PICC site looks good.  Psychiatric:        Mood and Affect: Mood normal.     Problem List Items Addressed This Visit      Medium   Sinusitis    Recent scans show persistent, partial opacification of her mastoid, ethmoid, sphenoid and maxillary sinuses.  Ear and sinus infections are more common in people with Turner syndrome.  I will ask Dr. Erik Obey to evaluate the need for sinus endoscopy.      Sensorineural hearing loss (SNHL) of both ears    We need to check with Dr. Erik Obey, her ENT physician, about how best to reschedule her evaluation at [facility] for possible cochlear implants.   Apparently hearing loss is more common in people with Turner syndrome.  Jani tells me that she did not have any obvious hearing impairment before onset of her meningitis.      Pneumococcal meningitis    Even though her pneumococcal isolate showed beta-lactam resistance and her vancomycin levels were not always in the therapeutic range, I am still very hopeful that her bacteremia and meningitis have now been cured through a combination of double antibiotic therapy for 6 full weeks.  I discussed options with Lorma and her family including repeating blood cultures now or even repeating lumbar puncture.  However I let them know that I am perfectly comfortable simply observing off of antibiotics for now.  They are in agreement with that plan.          Michel Bickers, MD [facility] for [facility] [phone] pager   [phone] cell 03/03/2018, 3:32 PM

## 2018-03-03 NOTE — Assessment & Plan Note (Addendum)
We need to check with Dr. Erik Obey, her ENT physician, about how best to reschedule her evaluation at [facility] for possible cochlear implants.  Apparently hearing loss is more common in people with Turner syndrome.  Meeghan tells me that she did not have any obvious hearing impairment before onset of her meningitis.

## 2018-03-03 NOTE — Assessment & Plan Note (Signed)
Even though her pneumococcal isolate showed beta-lactam resistance and her vancomycin levels were not always in the therapeutic range, I am still very hopeful that her bacteremia and meningitis have now been cured through a combination of double antibiotic therapy for 6 full weeks.  I discussed options with Emma Martin and her family including repeating blood cultures now or even repeating lumbar puncture.  However I let them know that I am perfectly comfortable simply observing off of antibiotics for now.  They are in agreement with that plan.

## 2018-03-04 DIAGNOSIS — Z452 Encounter for adjustment and management of vascular access device: Secondary | ICD-10-CM

## 2018-03-04 DIAGNOSIS — Q969 Turner's syndrome, unspecified: Secondary | ICD-10-CM

## 2018-03-04 DIAGNOSIS — I351 Nonrheumatic aortic (valve) insufficiency: Secondary | ICD-10-CM

## 2018-03-04 DIAGNOSIS — E785 Hyperlipidemia, unspecified: Secondary | ICD-10-CM

## 2018-03-04 DIAGNOSIS — I69354 Hemiplegia and hemiparesis following cerebral infarction affecting left non-dominant side: Secondary | ICD-10-CM

## 2018-03-04 DIAGNOSIS — F329 Major depressive disorder, single episode, unspecified: Secondary | ICD-10-CM

## 2018-03-04 DIAGNOSIS — Z792 Long term (current) use of antibiotics: Secondary | ICD-10-CM

## 2018-03-04 DIAGNOSIS — Z9181 History of falling: Secondary | ICD-10-CM

## 2018-03-04 DIAGNOSIS — F411 Generalized anxiety disorder: Secondary | ICD-10-CM

## 2018-03-04 DIAGNOSIS — Z7951 Long term (current) use of inhaled steroids: Secondary | ICD-10-CM

## 2018-03-04 DIAGNOSIS — B953 Streptococcus pneumoniae as the cause of diseases classified elsewhere: Secondary | ICD-10-CM | POA: Diagnosis not present

## 2018-03-04 DIAGNOSIS — E039 Hypothyroidism, unspecified: Secondary | ICD-10-CM

## 2018-03-04 DIAGNOSIS — H903 Sensorineural hearing loss, bilateral: Secondary | ICD-10-CM

## 2018-03-04 DIAGNOSIS — I10 Essential (primary) hypertension: Secondary | ICD-10-CM

## 2018-03-04 DIAGNOSIS — R7881 Bacteremia: Secondary | ICD-10-CM

## 2018-03-24 ENCOUNTER — Other Ambulatory Visit: Payer: Self-pay | Admitting: Family Medicine

## 2018-03-24 DIAGNOSIS — N2889 Other specified disorders of kidney and ureter: Secondary | ICD-10-CM

## 2018-03-25 DIAGNOSIS — Z23 Encounter for immunization: Secondary | ICD-10-CM | POA: Diagnosis not present

## 2018-03-26 ENCOUNTER — Ambulatory Visit
Admission: RE | Admit: 2018-03-26 | Discharge: 2018-03-26 | Disposition: A | Discharge status: Home / Self Care | Payer: BLUE CROSS/BLUE SHIELD | Source: Ambulatory Visit | Attending: Family Medicine | Admitting: Family Medicine

## 2018-03-26 DIAGNOSIS — N2889 Other specified disorders of kidney and ureter: Secondary | ICD-10-CM | POA: Diagnosis not present

## 2018-03-31 ENCOUNTER — Encounter: Payer: Self-pay | Admitting: Internal Medicine

## 2018-03-31 ENCOUNTER — Other Ambulatory Visit: Payer: Self-pay

## 2018-03-31 ENCOUNTER — Ambulatory Visit (INDEPENDENT_AMBULATORY_CARE_PROVIDER_SITE_OTHER): Payer: BLUE CROSS/BLUE SHIELD | Admitting: Internal Medicine

## 2018-03-31 DIAGNOSIS — G001 Pneumococcal meningitis: Secondary | ICD-10-CM

## 2018-03-31 NOTE — Assessment & Plan Note (Signed)
She continues to improve 5 weeks after completing 6 weeks of total therapy for bacteremic pneumococcal meningitis indicating cure.  I agree with further evaluation for her chronic sphenoid sinusitis.  I do not have access to her immunization records with her PCP.  She needs pneumococcal vaccination prior to her cochlear implant.  If she has never been vaccinated she would need Prevnar first followed by Pneumovax greater than or equal to 8 weeks later.  If she has had Pneumovax in the past she needs Prevnar greater than equal to 1 year later.  She and her father are aware that she may follow-up here as needed.

## 2018-03-31 NOTE — Progress Notes (Signed)
[facility] for Infectious Disease  Patient Active Problem List   Diagnosis Date Noted  . Sinusitis 03/03/2018    Priority: Medium  . Sensorineural hearing loss (SNHL) of both ears 01/31/2018    Priority: Medium  . Pneumococcal bacteremia     Priority: Medium  . Pneumococcal meningitis     Priority: Medium  . Bicuspid aortic valve 04/06/2013    Priority: Low  . 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 Cerebrovascular accident (CVA) ([place])   . Benign essential HTN   . Tachypnea   . Dyslipidemia 01/31/2018  . Acute ischemic stroke ([place]) 01/30/2018  . Turner syndrome   . Hypothyroidism 01/15/2018  . Generalized anxiety disorder 01/15/2018  . HTN (hypertension) 04/06/2013    Patient's Medications  New Prescriptions   No medications on file  Previous Medications   ACETAMINOPHEN (TYLENOL) 325 MG TABLET    Take 2 tablets (650 mg total) by mouth every 4 (four) hours as needed for mild pain (or temp > 37.5 C (99.5 F)).   ATORVASTATIN (LIPITOR) 20 MG TABLET    Take 1 tablet (20 mg total) by mouth daily at 6 PM.   FLUOXETINE (PROZAC) 40 MG CAPSULE    Take 1 capsule (40 mg total) by mouth daily.   FLUTICASONE (FLONASE) 50 MCG/ACT NASAL SPRAY    Place 2 sprays into both nostrils daily.   LEVOTHYROXINE (SYNTHROID, LEVOTHROID) 75 MCG TABLET    Take 1 tablet (75 mcg total) by mouth daily before breakfast.   LISINOPRIL (PRINIVIL,ZESTRIL) 10 MG TABLET    Take 10 mg by mouth daily.   MAGNESIUM OXIDE (MAG-OX) 400 (241.3 MG) MG TABLET    Take 1 tablet (400 mg total) by mouth 2 (two) times daily.   MULTIPLE VITAMINS-MINERALS (CENTRUM PO)    Take 1 tablet by mouth daily.   POTASSIUM CHLORIDE SA (K-DUR,KLOR-CON) 20 MEQ TABLET    Take 2 tablets (40 mEq total) by mouth 3 (three) times daily.  Modified Medications   No medications on file  Discontinued Medications   No medications on file    Subjective: Emma Martin is in for her hospital follow-up visit.  She is accompanied by her  father.  She has a history of Turner syndrome, scoliosis requiring Harrington rod repair and a bicuspid aortic valve.  She was hospitalized in late December with bacteremic pneumococcal meningitis.  Her isolate was resistant to penicillin and intermediately resistant to ceftriaxone.  Therefore she was treated with both high-dose ceftriaxone and vancomycin.  Repeat blood cultures done 48 hours into therapy were negative.  A TEE done on 01/20/2019 did not reveal any evidence of endocarditis.  She received 4 days of dexamethasone therapy and the initial plan was to complete 2 weeks of IV ceftriaxone and vancomycin.  She was discharged home to complete therapy.  She had noted some partial hearing loss but this improved transiently while on steroids.  However on 01/25/2018 she had sudden, complete hearing loss.  She was seen in the emergency department and referred to ENT.  She was going to be referred to [facility] for evaluation for possible cochlear implants but she was found to be very lethargic with left-sided weakness leading to readmission on 01/30/2018.  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  Repeat blood cultures were negative.  Repeat TEE again showed no evidence of vegetations.  I saw her and recommended extending therapy with ceftriaxone and vancomycin for 6 full weeks through 02/26/2018.  She still has some mild headache but rarely needs to take anything for it.  Her father states that she is slowly but steadily improving.  She has been evaluated for cochlear implants and the plan is to have one placed on the right after she completes evaluation for chronic sphenoid sinusitis.  Review of Systems: Review of Systems  Constitutional: Negative for chills, diaphoresis and fever.  HENT: Positive for hearing loss.   Neurological: Positive for headaches.    Past Medical History:  Diagnosis Date  . Bicuspid aortic valve    , mild aortic insufficiency. (No SBE prophylaxis  needed)  . Complication of anesthesia   . Dyslipidemia   . Generalized anxiety disorder    , with obsessive-compulsive traits.  . Hypertension   . Hypothyroidism   . Mixed gonadal dysgenesis    , (additional Y-bearing cell line) for which she reportedly underwent bilateral gonadectomy (patient unaware; needs confirmation) due to malignancy potential.  . PONV (postoperative nausea and vomiting)   . Primary amenorrhea    , treated starting at age 89 or 10 with estrogen and progesterone  . Scoliosis    , treated with Harrington rods (1997) for stabilization.  . Seasonal allergic rhinitis   . Short stature    , previously treated with growth hormone age 58-15 years.  Radford Pax syndrome    , previously followed by pediatric endocrinologist Freddy Jaksch, MD at [facility] through her early 54s).    Social History   Tobacco Use  . Smoking status: Never Smoker  . Smokeless tobacco: Never Used  Substance Use Topics  . Alcohol use: Yes    Alcohol/week: 1.0 - 2.0 standard drinks    Types: 1 - 2 Glasses of wine per week    Comment: Occasionally  . Drug use: No    Family History  Problem Relation Age of Onset  . Other Father        A + W  . Cancer Maternal Grandmother   . Cancer Paternal Grandfather   . Hypertension Mother   . Other Mother        Dyslipidemia  . Hypothyroidism Mother   . Mitral valve prolapse Mother     Allergies  Allergen Reactions  . Benzoin Rash    Objective: Vitals:   03/31/18 1443  BP: 137/83  Pulse: (!) 53  Temp: 97.6 F (36.4 C)  Weight: 109 lb (49.4 kg)   Body mass index is 20.6 kg/m.  Physical Exam Constitutional:      Comments: She is alert and pleasant.  She communicates easily with voice recognition software on her father's cell phone.  HENT:     Ears:     Comments: No mastoid tenderness. Eyes:     Conjunctiva/sclera: Conjunctivae normal.  Neck:     Musculoskeletal: Neck supple.  Cardiovascular:     Rate and Rhythm:  Normal rate and regular rhythm.     Heart sounds: No murmur.  Pulmonary:     Effort: Pulmonary effort is normal.     Breath sounds: Normal breath sounds.  Skin:    Findings: No rash.  Psychiatric:        Mood and Affect: Mood normal.     Problem List Items Addressed This Visit      Medium   Pneumococcal meningitis    She continues to improve 5 weeks after completing 6 weeks of total therapy for bacteremic pneumococcal meningitis indicating cure.  I agree with further evaluation for her chronic sphenoid  sinusitis.  I do not have access to her immunization records with her PCP.  She needs pneumococcal vaccination prior to her cochlear implant.  If she has never been vaccinated she would need Prevnar first followed by Pneumovax greater than or equal to 8 weeks later.  If she has had Pneumovax in the past she needs Prevnar greater than equal to 1 year later.  She and her father are aware that she may follow-up here as needed.          Michel Bickers, MD [facility] for Infectious [place] [facility] [phone] pager   [phone] cell 03/31/2018, 5:09 PM

## 2018-04-01 ENCOUNTER — Ambulatory Visit (HOSPITAL_BASED_OUTPATIENT_CLINIC_OR_DEPARTMENT_OTHER): Payer: BLUE CROSS/BLUE SHIELD | Admitting: Physical Medicine & Rehabilitation

## 2018-04-01 ENCOUNTER — Encounter: Payer: Self-pay | Admitting: Physical Medicine & Rehabilitation

## 2018-04-01 ENCOUNTER — Encounter: Payer: BLUE CROSS/BLUE SHIELD | Attending: Registered Nurse

## 2018-04-01 VITALS — BP 144/99 | HR 99 | Ht 61.0 in | Wt 109.0 lb

## 2018-04-01 DIAGNOSIS — I69398 Other sequelae of cerebral infarction: Secondary | ICD-10-CM | POA: Diagnosis not present

## 2018-04-01 DIAGNOSIS — I639 Cerebral infarction, unspecified: Secondary | ICD-10-CM | POA: Diagnosis not present

## 2018-04-01 DIAGNOSIS — R269 Unspecified abnormalities of gait and mobility: Secondary | ICD-10-CM | POA: Diagnosis not present

## 2018-04-01 DIAGNOSIS — I6381 Other cerebral infarction due to occlusion or stenosis of small artery: Secondary | ICD-10-CM

## 2018-04-01 NOTE — Progress Notes (Signed)
Subjective:    Patient ID: Emma Martin, female    DOB: 14-May-1980, 38 y.o.   MRN: [id number] 38 year old right-handed female with history of hypertension, hypothyroidism, Turner syndrome, bicuspid aortic valve.  Lives with her father.  Was independent using a walker prior to admission.  Recently hospitalized for streptococcal meningitis, had finished an antibiotic treatment.  Subsequent hearing loss  bilaterally.  Presented 01/30/2018 with left-sided weakness.  She was found on the floor unresponsive by her father.  Cranial CT scan unremarkable.  She did not receive tPA.  CT angiogram of head and neck with no emergent large vessel occlusion.  MRI/MRA  showed scattered diffusion abnormality overlying right cerebral convexity as well as the suprasellar cisterns.  Associated scattered multifocal acute, early subacute ischemic infarctions followed by infectious disease, maintained on antibiotic therapy.  HPI  Patient follows up after inpatient rehabilitation stay.  She has had no falls or other setbacks since discharge.  She has followed up with otolaryngology at [facility]. Patient is home living with her father.  She remains deaf, sensorineural urine loss secondary to pneumococcal meningitis, plan per otolaryngology is for right cochlear implant.  Left-sided anatomy poor due to ossification of the cochlea  Patient has finished home health PT OT she is ready to start outpatient PT OT and speech Pain Inventory Average Pain 2 Pain Right Now 2 My pain is aching  In the last 24 hours, has pain interfered with the following? General activity 0 Relation with others 0 Enjoyment of life 0 What TIME of day is your pain at its worst? evening Sleep (in general) Fair  Pain is worse with: unsure Pain improves with: medication Relief from Meds: 8  Mobility use a walker ability to climb steps?  yes do you drive?  no  Function disabled: date disabled 2019 I need assistance with  the following:  bathing, toileting and meal prep  Neuro/Psych bladder control problems bowel control problems weakness trouble walking  Prior Studies Any changes since last visit?  no  Physicians involved in your care Any changes since last visit?  no   Family History  Problem Relation Age of Onset  . Other Father        A + W  . Cancer Maternal Grandmother   . Cancer Paternal Grandfather   . Hypertension Mother   . Other Mother        Dyslipidemia  . Hypothyroidism Mother   . Mitral valve prolapse Mother    Social History   Socioeconomic History  . Marital status: Single    Spouse name: Not on file  . Number of children: Not on file  . Years of education: Not on file  . Highest education level: Not on file  Occupational History  . Not on file  Social Needs  . Financial resource strain: Not on file  . Food insecurity:    Worry: Not on file    Inability: Not on file  . Transportation needs:    Medical: Not on file    Non-medical: Not on file  Tobacco Use  . Smoking status: Never Smoker  . Smokeless tobacco: Never Used  Substance and Sexual Activity  . Alcohol use: Yes    Alcohol/week: 1.0 - 2.0 standard drinks    Types: 1 - 2 Glasses of wine per week    Comment: Occasionally  . Drug use: No  . Sexual activity: Not on file  Lifestyle  . Physical activity:    Days  per week: Not on file    Minutes per session: Not on file  . Stress: Not on file  Relationships  . Social connections:    Talks on phone: Not on file    Gets together: Not on file    Attends religious service: Not on file    Active member of club or organization: Not on file    Attends meetings of clubs or organizations: Not on file    Relationship status: Not on file  Other Topics Concern  . Not on file  Social History Narrative  . Not on file   Past Surgical History:  Procedure Laterality Date  . TEE WITHOUT CARDIOVERSION N/A 01/19/2018   Procedure: TRANSESOPHAGEAL ECHOCARDIOGRAM  (TEE);  Surgeon: Jerline Pain, MD;  Location: [facility] ENDOSCOPY;  Service: Cardiovascular;  Laterality: N/A;  . TEE WITHOUT CARDIOVERSION N/A 02/02/2018   Procedure: TRANSESOPHAGEAL ECHOCARDIOGRAM (TEE);  Surgeon: Elouise Munroe, MD;  Location: [place];  Service: Cardiology;  Laterality: N/A;   Past Medical History:  Diagnosis Date  . Bicuspid aortic valve    , mild aortic insufficiency. (No SBE prophylaxis needed)  . Complication of anesthesia   . Dyslipidemia   . Generalized anxiety disorder    , with obsessive-compulsive traits.  . Hypertension   . Hypothyroidism   . Mixed gonadal dysgenesis    , (additional Y-bearing cell line) for which she reportedly underwent bilateral gonadectomy (patient unaware; needs confirmation) due to malignancy potential.  . PONV (postoperative nausea and vomiting)   . Primary amenorrhea    , treated starting at age 7 or 31 with estrogen and progesterone  . Scoliosis    , treated with Harrington rods (1997) for stabilization.  . Seasonal allergic rhinitis   . Short stature    , previously treated with growth hormone age 23-15 years.  Radford Pax syndrome    , previously followed by pediatric endocrinologist Freddy Jaksch, MD at [facility] through her early 60s).   BP (!) 144/99   Pulse 99   Ht 5\' 1"  (1.549 m)   Wt 109 lb (49.4 kg)   SpO2 100%   BMI 20.60 kg/m   Opioid Risk Score:   Fall Risk Score:  `1  Depression screen PHQ 2/9  No flowsheet data found.   Review of Systems  Constitutional: Negative.   HENT: Negative.   Eyes: Negative.   Respiratory: Negative.   Cardiovascular: Negative.   Gastrointestinal: Negative.   Endocrine: Negative.   Genitourinary: Positive for difficulty urinating.  Musculoskeletal: Positive for gait problem.  Skin: Negative.   Allergic/Immunologic: Negative.   Neurological: Positive for weakness and headaches.  Hematological: Negative.   Psychiatric/Behavioral: Negative.   All other systems  reviewed and are negative.      Objective:   Physical Exam Constitutional:      Appearance: Normal appearance. She is normal weight.  HENT:     Mouth/Throat:     Mouth: Mucous membranes are moist.     Pharynx: Oropharynx is clear.  Eyes:     Extraocular Movements: Extraocular movements intact.     Conjunctiva/sclera: Conjunctivae normal.     Pupils: Pupils are equal, round, and reactive to light.  Neurological:     Mental Status: She is alert.     Comments: Motor strength is 5/5 bilateral deltoid bicep tricep grip hip flexor knee extensor ankle dorsiflexor Ambulates with a wide-based gait she needs to occasionally steady herself using the wall.  In wide open spaces she uses a  walker. Negative straight leg raising Sensation reported is equal bilateral upper and lower limbs Normal finger-nose-finger as well as rapid alternating movements.  Also normal finger thumb opposition bilaterally.     Communication via transcription application on mobile device supplied by patient's father.      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  She also has a hearing loss that is profound. Patient will be starting outpatient PT OT speech tomorrow. She will follow-up with otolaryngology for right cochlear implant. PMNR follow-up on as-needed basis. Follow-up with neurology and PCP

## 2018-04-02 ENCOUNTER — Ambulatory Visit: Payer: BLUE CROSS/BLUE SHIELD

## 2018-04-02 ENCOUNTER — Ambulatory Visit: Payer: BLUE CROSS/BLUE SHIELD | Attending: Family Medicine | Admitting: Physical Therapy

## 2018-04-02 ENCOUNTER — Ambulatory Visit: Payer: BLUE CROSS/BLUE SHIELD | Admitting: Occupational Therapy

## 2018-04-02 ENCOUNTER — Encounter: Payer: Self-pay | Admitting: Physical Therapy

## 2018-04-02 DIAGNOSIS — I69315 Cognitive social or emotional deficit following cerebral infarction: Secondary | ICD-10-CM

## 2018-04-02 DIAGNOSIS — M6281 Muscle weakness (generalized): Secondary | ICD-10-CM

## 2018-04-02 DIAGNOSIS — R41841 Cognitive communication deficit: Secondary | ICD-10-CM | POA: Diagnosis not present

## 2018-04-02 DIAGNOSIS — R2689 Other abnormalities of gait and mobility: Secondary | ICD-10-CM

## 2018-04-02 DIAGNOSIS — R41844 Frontal lobe and executive function deficit: Secondary | ICD-10-CM | POA: Insufficient documentation

## 2018-04-02 DIAGNOSIS — R41842 Visuospatial deficit: Secondary | ICD-10-CM | POA: Insufficient documentation

## 2018-04-02 DIAGNOSIS — R41840 Attention and concentration deficit: Secondary | ICD-10-CM

## 2018-04-02 DIAGNOSIS — R278 Other lack of coordination: Secondary | ICD-10-CM

## 2018-04-02 DIAGNOSIS — R2681 Unsteadiness on feet: Secondary | ICD-10-CM | POA: Diagnosis not present

## 2018-04-02 NOTE — Therapy (Signed)
[facility] [address] [place], [place], [address] Phone: [phone]   Fax:  [phone]  Speech Language Pathology Evaluation  Patient Details  Name: Emma Martin MRN: [id number] Date of Birth: 12-16-1980 Referring Provider (SLP): Maurice Small, MD   Encounter Date: 04/02/2018  End of Session - 04/02/18 1718    Visit Number  1    Number of Visits  17    Date for SLP Re-Evaluation  06/25/18   12 weeks   SLP Start Time  9357    SLP Stop Time   1622    SLP Time Calculation (min)  46 min    Activity Tolerance  Patient tolerated treatment well       Past Medical History:  Diagnosis Date  . Bicuspid aortic valve    , mild aortic insufficiency. (No SBE prophylaxis needed)  . Complication of anesthesia   . Dyslipidemia   . Generalized anxiety disorder    , with obsessive-compulsive traits.  . Hypertension   . Hypothyroidism   . Mixed gonadal dysgenesis    , (additional Y-bearing cell line) for which she reportedly underwent bilateral gonadectomy (patient unaware; needs confirmation) due to malignancy potential.  . PONV (postoperative nausea and vomiting)   . Primary amenorrhea    , treated starting at age 12 or 22 with estrogen and progesterone  . Scoliosis    , treated with Harrington rods (1997) for stabilization.  . Seasonal allergic rhinitis   . Short stature    , previously treated with growth hormone age 39-15 years.  Radford Pax syndrome    , previously followed by pediatric endocrinologist Freddy Jaksch, MD at [facility] through her early 89s).    Past Surgical History:  Procedure Laterality Date  . TEE WITHOUT CARDIOVERSION N/A 01/19/2018   Procedure: TRANSESOPHAGEAL ECHOCARDIOGRAM (TEE);  Surgeon: Jerline Pain, MD;  Location: [facility] ENDOSCOPY;  Service: Cardiovascular;  Laterality: N/A;  . TEE WITHOUT CARDIOVERSION N/A 02/02/2018   Procedure: TRANSESOPHAGEAL ECHOCARDIOGRAM (TEE);  Surgeon:  Elouise Munroe, MD;  Location: [place];  Service: Cardiology;  Laterality: N/A;    There were no vitals filed for this visit.  Subjective Assessment - 04/02/18 1541    Subjective  Pt arrives with dad and sister.    Currently in Pain?  Yes    Pain Score  3     Pain Location  Head    Pain Orientation  Anterior;Posterior    Pain Descriptors / Indicators  Headache    Pain Frequency  Intermittent    Aggravating Factors   dehydration    Pain Relieving Factors  meds         SLP Evaluation [facility] - 04/02/18 1541      SLP Visit Information   SLP Received On  04/02/18    Referring Provider (SLP)  Maurice Small, MD    Onset Date  01-30-18    Medical Diagnosis  CVA      Prior Functional Status   Cognitive/Linguistic Baseline  Within functional limits    Type of Home  House     Lives With  Family      Cognition   Overall Cognitive Status  Impaired/Different from baseline    Area of Impairment  Attention;Memory;Awareness    Current Attention Level  Sustained    Attention Comments  Pt with decr'd sustained attention - likely approx 30 seconds-45 seconds. Pt's generative naming was better in first 30 seconds and waned heavily between 31-60  seconds.     Memory  Decreased short-term memory   decr'd long term memory   Memory Comments  Asks where mother is - has been dead 2 years. Asks when leaving for appointmetns 20 minutes after asking previously.     Awareness  Intellectual    Awareness Comments  named "maybe slower" as only deficit thinking    Memory  --    Memory Impairment  --    Decreased Long Term Memory  --    Awareness  --    Awareness Impairment  --    Behaviors  Restless;Impulsive   very flat affect     Auditory Comprehension   Overall Auditory Comprehension  --   sensorineural hearing loss; pt reads to compensate   Overall Auditory Comprehension Comments  Possible cochlear implant in the future      Reading Comprehension   Reading Status  Within funtional limits    pt had extratime accomodation due to processing written lang     Verbal Expression   Overall Verbal Expression  Appears within functional limits for tasks assessed      Oral Motor/Sensory Function   Overall Oral Motor/Sensory Function  Appears within functional limits for tasks assessed      Motor Speech   Overall Motor Speech  Appears within functional limits for tasks assessed                      SLP Education - 04/02/18 1717    Education Details  constant therapy, eval results (attention, awareness), games and other tasks for improving attention    Person(s) Educated  Patient;Parent(s)    Methods  Explanation;Handout    Comprehension  Verbalized understanding;Need further instruction;Verbal cues required       SLP Short Term Goals - 04/02/18 1725      SLP SHORT TERM GOAL #1   Title  pt will demo sustained attention to complete 4 7-minute cognitive linguistic tasks, for improved participation in therapy over 3 sessions    Time  4    Period  Weeks    Status  New   or 9 total visits, for all STGs     SLP SHORT TERM GOAL #2   Title  pt will demo incr'd awareness by telling SLP 3 things that are more difficult now than prior to CVA    Time  4    Period  Weeks    Status  New      SLP SHORT TERM GOAL #3   Title  pt will use a Child psychotherapist with occasional min A to do so    Time  4    Period  Weeks    Status  New       SLP Long Term Goals - 04/02/18 1748      SLP LONG TERM GOAL #1   Title  pt will demo sustained/selective attention to complete 3 12-minute cognitive linguistic tasks, for improved participation in therapy over 3 sessions    Time  8    Period  Weeks   or 17 sessions, for all LTGs   Status  New      SLP LONG TERM GOAL #2   Title  pt's father/family will report pt asking about schedule less than prior to ST    Time  8    Period  Weeks    Status  New      SLP LONG TERM GOAL #3   Title  pt  will demo use of  memory notebook/planner for homework, schedule verification, etc in 8 therapy sessions    Time  8    Period  Weeks    Status  New       Plan - 04/02/18 1719    Clinical Impression Statement  Pt presents today with cognitive linguistic deficits 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 Long term memory suspected as affected SLP due to father reporting pt asking where her mother is intermittently during the week (mother passed away 2 years ago). SLP to complete cognitive linguistic testing next session. Pt's father reports pt will ask what time they are leaving for appointments multiple times per day.Pt will benefit from skilled ST for improvement in cognitive communication skills in order to incr pt independnence.     Speech Therapy Frequency  2x / week    Duration  --   8 weeks or 17 total visits   Treatment/Interventions  Cueing hierarchy;SLP instruction and feedback;Compensatory strategies;Functional tasks;Cognitive reorganization;Internal/external aids;Patient/family education    Potential to Achieve Goals  Good    Potential Considerations  Ability to learn/carryover information;Co-morbidities;Severity of impairments   pt has profound sensorineural hearing loss due to meningitis (december 2019)   Consulted and Agree with Plan of Care  Patient;Family member/caregiver    Family Member Consulted  father       Patient will benefit from skilled therapeutic intervention in order to improve the following deficits and impairments:   Cognitive communication deficit    Problem List Patient Active Problem List   Diagnosis Date Noted  . Sinusitis 03/03/2018  . 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 Cerebrovascular accident (CVA) ([place])   . Benign essential HTN   . Tachypnea   . Sensorineural hearing loss (SNHL) of both ears 01/31/2018  . Dyslipidemia 01/31/2018  . Acute ischemic stroke ([place]) 01/30/2018   . Pneumococcal bacteremia   . Pneumococcal meningitis   . Turner syndrome   . Hypothyroidism 01/15/2018  . Generalized anxiety disorder 01/15/2018  . Bicuspid aortic valve 04/06/2013  . HTN (hypertension) 04/06/2013    [facility] ,MS, [place]  04/02/2018, 6:00 PM  [facility] [address] [place], [place], [address] Phone: [phone]   Fax:  [phone]  Name: Emma Martin MRN: [id number] Date of Birth: 1980/04/23

## 2018-04-02 NOTE — Therapy (Addendum)
[facility] [address] [place], [place], [address] Phone: [phone]   Fax:  [phone]  Occupational Therapy Evaluation  Patient Details  Name: Emma Martin MRN: [id number] Date of Birth: 1980/04/07 Referring Provider (OT): Maurice Small, MD   Encounter Date: 04/02/2018  OT End of Session - 04/02/18 1514    Visit Number  1    Number of Visits  25    Date for OT Re-Evaluation  07/01/18    Authorization Type  [employer]    Authorization Time Period  60 VL combined PT/OT    OT Start Time  1406    OT Stop Time  1445    OT Time Calculation (min)  39 min    Activity Tolerance  Patient limited by lethargy    Behavior During Therapy  Flat affect       Past Medical History:  Diagnosis Date  . Bicuspid aortic valve    , mild aortic insufficiency. (No SBE prophylaxis needed)  . Complication of anesthesia   . Dyslipidemia   . Generalized anxiety disorder    , with obsessive-compulsive traits.  . Hypertension   . Hypothyroidism   . Mixed gonadal dysgenesis    , (additional Y-bearing cell line) for which she reportedly underwent bilateral gonadectomy (patient unaware; needs confirmation) due to malignancy potential.  . PONV (postoperative nausea and vomiting)   . Primary amenorrhea    , treated starting at age 38 or 19 with estrogen and progesterone  . Scoliosis    , treated with Harrington rods (1997) for stabilization.  . Seasonal allergic rhinitis   . Short stature    , previously treated with growth hormone age 37-15 years.  Radford Pax syndrome    , previously followed by pediatric endocrinologist Freddy Jaksch, MD at [facility] through her early 38s).    Past Surgical History:  Procedure Laterality Date  . TEE WITHOUT CARDIOVERSION N/A 01/19/2018   Procedure: TRANSESOPHAGEAL ECHOCARDIOGRAM (TEE);  Surgeon: Jerline Pain, MD;  Location: [facility] ENDOSCOPY;  Service: Cardiovascular;  Laterality: N/A;  . TEE  WITHOUT CARDIOVERSION N/A 02/02/2018   Procedure: TRANSESOPHAGEAL ECHOCARDIOGRAM (TEE);  Surgeon: Elouise Munroe, MD;  Location: [facility];  Service: Cardiology;  Laterality: N/A;    There were no vitals filed for this visit.  Subjective Assessment - 04/02/18 1513    Subjective   I want to walk better    Pertinent History  meningitis, CVA 01/30/2018    Currently in Pain?  Yes    Pain Score  2     Pain Location  Head    Pain Descriptors / Indicators  Aching    Pain Type  Chronic pain    Pain Onset  More than a month ago    Pain Frequency  Intermittent    Aggravating Factors   unsure    Pain Relieving Factors  tylennol    Multiple Pain Sites  No        [facility] OT Assessment - 04/02/18 1532      Assessment   Medical Diagnosis  CVA, meninigitis    Referring Provider (OT)  Maurice Small, MD    Onset Date/Surgical Date  01/30/18      Precautions   Precautions  Fall    Precaution Comments  hearing loss- new onset      Balance Screen   Has the patient fallen in the past 6 months  Yes    How many times?  1  Has the patient had a decrease in activity level because of a fear of falling?   No    Is the patient reluctant to leave their home because of a fear of falling?   No      Home  Environment   Family/patient expects to be discharged to:  Private residence    Lives With  Family      Prior Function   Level of [place]  Unemployed    Leisure  Enjoys walking in the neighborhood; would like to go back to work      ADL   Eating/Feeding  Modified independent    Grooming  Supervision/safety    Upper Body Bathing  Supervision/safety    Lower Body Bathing  Supervision/safety    Lower Psychologist, occupational -  Economist  Supervision/safety    ADL comments  Pt's requires supervision for safety      IADL   Light Housekeeping  Needs help with  all home maintenance tasks   Pt will pour herself a cup of coffee   Meal Prep  Needs to have meals prepared and served   Dependent   Medication Management  Has difficulty remembering to take medication      Mobility   Mobility Status  --   supervision     Written Expression   Dominant Hand  Right    Handwriting  100% legible      Vision Assessment   Vision Assessment  Vision not tested    Comment  Vision to be further tested in a functional context, possible left inattention      Cognition   Overall Cognitive Status  Impaired/Different from baseline    Memory  Impaired    Memory Impairment  Decreased short term memory    Awareness  Impaired    Executive Function  Sequencing;Reasoning;Initiating;Organizing;Decision Making    Reasoning  Impaired    Sequencing  Impaired    Initiating  Impaired    Behaviors  Other (comment)   flat affect, does not initiate     Sensation   Light Touch  Not tested      Coordination   Fine Motor Movements are Fluid and Coordinated  No    9 Hole Peg Test  Right;Left    Right 9 Hole Peg Test  32.94 secs    Left 9 Hole Peg Test  58.46 secs   difficulty following instructions, recalling steps     ROM / Strength   AROM / PROM / Strength  AROM;Strength      AROM   Overall AROM   Within functional limits for tasks performed      Strength   Overall Strength  Deficits    Overall Strength Comments  difficult to assess due to cognition however it appears as though RUE strength grossly 4/5, LUE 3+/5      Hand Function   Right Hand Grip (lbs)  35 lbs    Left Hand Grip (lbs)  36 lbs                        OT Short Term Goals - 04/02/18 1600      OT SHORT TERM GOAL #1   Title  I with initial HEP    Baseline  dependent    Time  6  Period  Weeks    Status  New      OT SHORT TERM GOAL #2   Title  Pt will perform all basic ADLS modified independently    Baseline  supervision for bathing, dressing, shower transfers    Time   6    Period  Weeks    Status  New      OT SHORT TERM GOAL #3   Title  Pt will perform cold meal prep modified indpendently and basic cooking task with min A    Baseline  dependent, pt only perform basic beverage prep    Time  6    Period  Weeks    Status  New      OT SHORT TERM GOAL #4   Title  Pt will perform basic home management modified I    Baseline  dependent    Time  6    Period  Weeks    Status  New      OT SHORT TERM GOAL #5   Title  Pt will demonstrate improved fine motor coordination as evidenced by decreasing 9 hole peg test by 5 secs for LUE.    Baseline  RUE 32.94 secs, LUE 58.46 secs    Time  6    Period  Weeks    Status  New      Additional Short Term Goals   Additional Short Term Goals  --      OT SHORT TERM GOAL #6   Title  Further assess visual perceptual skills and set goals prn    Time  6    Period  Weeks    Status  New        OT Long Term Goals - 04/02/18 1618      OT LONG TERM GOAL #1   Title  Pt will perform mod complex home managment modified independently.    Baseline  dependent    Time  12    Period  Weeks    Status  New      OT LONG TERM GOAL #2   Title  Pt will perform basic cooking with supervision.    Baseline  dependent    Time  12    Period  Weeks    Status  New      OT LONG TERM GOAL #3   Title  Pt will increase bilateral grip strength by 5 lbs for increased functional use during ADLs.    Baseline  RUE 35 lbs LUE 36 lbs    Time  12    Period  Weeks    Status  New      OT LONG TERM GOAL #4   Title  Pt will demonstrate adequate bilateral UE strength and endurance to retrieve a 3 lbs weight from overhead shelf with left and right UE's individually  without drops x 5 reps in standing modified independently.    Baseline  unable due to balance and decreased UE strength    Time  12    Period  Weeks    Status  New      OT LONG TERM GOAL #5   Title  Pt will demonstrate abilty to sequence a multi step functional or home  management task modified independently.    Baseline  Pt requires assist for sequencing/ organization    Time  12    Period  Weeks    Status  New      Long Term Additional Goals  Additional Long Term Goals  Yes      OT LONG TERM GOAL #6   Title  Pt/ family will verbalize understanding cogntive compensation/ adapted strategies for ADLs/IADLs.    Baseline  dependent    Time  12    Period  Weeks    Status  New            Plan - 04/02/18 1517    Clinical Impression Statement  Pt is a 38 y.o. female with a history of hypertension, hypothyroidism, Turner syndrome, scoliosis status post Harrington rods, bicuspid aortic valve, streptococcal meningitis and bacteremia. She presented with left sided weakness 01/30/2018 and found to have multiple acute strokes concern for embolic phenomenon possibly related to an infectious source. Pt received [facility]rapsit at [facility] and she was d/c home with family on 02/12/2018. Pt received home health therapies and now she presents to OT with the following deficits: decreased LUE coordination, decreased strength, decreased balance, deconditioning, visual perceptual deficits, cognitive deficits, hearing loss which impacts performance of daily activities. Pt can benefit from skilled occupational therapy to maximize safety and I with ADLS/IADLS. Pt is currently living with her father. She was unemployed prior to her CVA however she was completely I with all ADLS/IADLS.    OT Occupational Profile and History  Detailed Assessment- Review of Records and additional review of physical, cognitive, psychosocial history related to current functional performance    Occupational performance deficits (Please refer to evaluation for details):  ADL's;IADL's;Education;Social Participation;Leisure;Play;Work    Marketing executive / Function / Physical Skills  ADL;Dexterity;Flexibility;Hearing;ROM;Strength;Vision;IADL;FMC;Coordination;Balance;Decreased knowledge of  precautions;Endurance;Gait;Pain;Sensation;UE functional use;Mobility;GMC;Fascial restriction;Cardiopulmonary status limiting activity    Cognitive Skills  Attention;Problem Solve;Memory;Safety Awareness;Sequencing;Energy/Drive;Perception    Rehab Potential  Fair    Clinical Decision Making  Several treatment options, min-mod task modification necessary    Comorbidities Affecting Occupational Performance:  May have comorbidities impacting occupational performance    Modification or Assistance to Complete Evaluation   Min-Moderate modification of tasks or assist with assess necessary to complete eval    OT Frequency  2x / week    OT Duration  12 weeks (will request 3 visits x 64month  from Medicaid initally.)   OT Treatment/Interventions  Self-care/ADL training;Fluidtherapy;Therapeutic exercise;Energy conservation;Gait Training;Functional Furniture conservator/restorer;Therapeutic activities;Visual/perceptual remediation/compensation;Balance training;Patient/family education;Cognitive remediation/compensation;Passive range of motion;Manual Therapy;DME and/or AE instruction;Neuromuscular education;Paraffin;Ultrasound;Moist Heat;Aquatic Therapy    Plan  inital HEP for LUe coordination, further assess visual perceptual skills in a functional context    Consulted and Agree with Plan of Care  Patient;Family member/caregiver    Family Member Consulted  father Karle Starch, sister Taylyn       Patient will benefit from skilled therapeutic intervention in order to improve the following deficits and impairments:  Body Structure / Function / Physical Skills, Cognitive Skills  Visit Diagnosis: Other abnormalities of gait and mobility - Plan: Ot plan of care cert/re-cert  Unsteadiness on feet - Plan: Ot plan of care cert/re-cert  Muscle weakness (generalized) - Plan: Ot plan of care cert/re-cert  Other lack of coordination - Plan: Ot plan of care cert/re-cert  Cognitive social or emotional deficit following cerebral  infarction - Plan: Ot plan of care cert/re-cert  Frontal lobe and executive function deficit - Plan: Ot plan of care cert/re-cert  Attention and concentration deficit - Plan: Ot plan of care cert/re-cert  Visuospatial deficit - Plan: Ot plan of care cert/re-cert    Problem List Patient Active Problem List   Diagnosis Date Noted  . Sinusitis 03/03/2018  . 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 Cerebrovascular accident (CVA) ([place])   . Benign essential HTN   . Tachypnea   . Sensorineural hearing loss (SNHL) of both ears 01/31/2018  . Dyslipidemia 01/31/2018  . Acute ischemic stroke ([place]) 01/30/2018  . Pneumococcal bacteremia   . Pneumococcal meningitis   . Turner syndrome   . Hypothyroidism 01/15/2018  . Generalized anxiety disorder 01/15/2018  . Bicuspid aortic valve 04/06/2013  . HTN (hypertension) 04/06/2013    Calixto Pavel 04/02/2018, 5:11 PM  [facility] [address] [place], [place], [address] Phone: [phone]   Fax:  [phone]  Name: JEANELL MANGAN MRN: [id number] Date of Birth: 1980-11-04

## 2018-04-02 NOTE — Therapy (Signed)
[facility] [address] [place], [place], [address] Phone: [phone]   Fax:  [phone]  Physical Therapy Evaluation  Patient Details  Name: Emma Martin MRN: [id number] Date of Birth: August 02, 1980 Referring Provider (PT): Maurice Small, MD   Encounter Date: 04/02/2018  PT End of Session - 04/02/18 1611    Visit Number  1    Number of Visits  25    Date for PT Re-Evaluation  07/01/18    Authorization Type  BCBS, Medicaid    Authorization - Visit Number  1    Authorization - Number of Visits  30    PT Start Time  1450    PT Stop Time  1533    PT Time Calculation (min)  43 min    Equipment Utilized During Treatment  Gait belt    Activity Tolerance  Patient tolerated treatment well    Behavior During Therapy  WFL for tasks assessed/performed;Flat affect       Past Medical History:  Diagnosis Date  . Bicuspid aortic valve    , mild aortic insufficiency. (No SBE prophylaxis needed)  . Complication of anesthesia   . Dyslipidemia   . Generalized anxiety disorder    , with obsessive-compulsive traits.  . Hypertension   . Hypothyroidism   . Mixed gonadal dysgenesis    , (additional Y-bearing cell line) for which she reportedly underwent bilateral gonadectomy (patient unaware; needs confirmation) due to malignancy potential.  . PONV (postoperative nausea and vomiting)   . Primary amenorrhea    , treated starting at age 8 or 64 with estrogen and progesterone  . Scoliosis    , treated with Harrington rods (1997) for stabilization.  . Seasonal allergic rhinitis   . Short stature    , previously treated with growth hormone age 57-16 years.  Radford Pax syndrome    , previously followed by pediatric endocrinologist Freddy Jaksch, MD at [facility] through her early 68s).    Past Surgical History:  Procedure Laterality Date  . TEE WITHOUT CARDIOVERSION N/A 01/19/2018   Procedure: TRANSESOPHAGEAL  ECHOCARDIOGRAM (TEE);  Surgeon: Jerline Pain, MD;  Location: [facility] ENDOSCOPY;  Service: Cardiovascular;  Laterality: N/A;  . TEE WITHOUT CARDIOVERSION N/A 02/02/2018   Procedure: TRANSESOPHAGEAL ECHOCARDIOGRAM (TEE);  Surgeon: Elouise Munroe, MD;  Location: [place];  Service: Cardiology;  Laterality: N/A;    There were no vitals filed for this visit.   Subjective Assessment - 04/02/18 1454    Subjective  Dad reports using the walker mostly outside, not really using the walker in the house.  Feeling like strength is coming back to normal.  Activity level has really decreased since hopsitalization 01/15/18.  Has had several falls.    Patient is accompained by:  Family member   Dad, sister   Patient Stated Goals  Want to feel stronger moving around, to not depend on walker.    Currently in Pain?  Yes    Pain Score  2     Pain Location  Head    Pain Descriptors / Indicators  Aching    Pain Type  Chronic pain    Pain Onset  More than a month ago    Pain Frequency  Intermittent    Aggravating Factors   unsure    Pain Relieving Factors  tylenol    Effect of Pain on Daily Activities  PT will monitor pain, but will not address as a goal at this time.  [facility] PT Assessment - 04/02/18 1458      Assessment   Medical Diagnosis  CVA    Referring Provider (PT)  Maurice Small, MD    Onset Date/Surgical Date  01/30/18   CVA (01/15/18-menigitis, hearing loss)     Precautions   Precautions  Fall   Hard of hearing   Precaution Comments  hearing loss-new onset as of 01/15/18      Balance Screen   Has the patient fallen in the past 6 months  Yes    How many times?  2-3    Has the patient had a decrease in activity level because of a fear of falling?   Yes    Is the patient reluctant to leave their home because of a fear of falling?   No      Home Film/video editor residence    Living Arrangements  Parent    Available Help at Discharge  Family    Type of  [place] to enter    Home Layout  Two level    [place] - 2 wheels      Prior Function   Level of Independence  Independent    Vocation  Unemployed   Has Master's degree in [place]  Enjoys walking in the neighborhood; would like to go back to work      Observation/Other Assessments   Focus on Therapeutic Outcomes (FOTO)   Functional Intake status score:  60      ROM / Strength   AROM / PROM / Strength  Strength      Strength   Overall Strength  Deficits    Overall Strength Comments  Difficult to assess due to pt difficulty following directions for MMT, but hip flexors at least 3+/5, hamstrings 3+/5; quads 3+/5, ankle dorsiflexion 4/5      Transfers   Transfers  Sit to Stand;Stand to Sit    Sit to Stand  5: Supervision;Without upper extremity assist;From chair/3-in-1    Five time sit to stand comments   16    Stand to Sit  5: Supervision;Without upper extremity assist;To chair/3-in-1      Ambulation/Gait   Ambulation/Gait  Yes    Ambulation/Gait Assistance  5: Supervision    Ambulation Distance (Feet)  150 Feet    Assistive device  Rolling walker    Gait Pattern  Step-through pattern;Decreased stride length;Wide base of support    Ambulation Surface  Level;Indoor    Gait velocity  18.03 sec = 1.82 ft/sec      Standardized Balance Assessment   Standardized Balance Assessment  Timed Up and Go Test;Berg Balance Test      Berg Balance Test   Sit to Stand  Able to stand without using hands and stabilize independently    Standing Unsupported  Able to stand 2 minutes with supervision    Sitting with Back Unsupported but Feet Supported on Floor or Stool  Able to sit safely and securely 2 minutes    Stand to Sit  Sits safely with minimal use of hands    Transfers  Able to transfer with verbal cueing and /or supervision    Standing Unsupported with Eyes Closed  Able to stand 10 seconds with supervision    Standing Unsupported with  Feet Together  Able to place feet together independently and stand for 1 minute with supervision    From Standing,  Reach Forward with Outstretched Arm  Can reach forward >12 cm safely (5")   7   From Standing Position, Pick up Object from Floor  Able to pick up shoe, needs supervision    From Standing Position, Turn to Look Behind Over each Shoulder  Looks behind from both sides and weight shifts well    Turn 360 Degrees  Able to turn 360 degrees safely but slowly    Standing Unsupported, Alternately Place Feet on Step/Stool  Able to complete >2 steps/needs minimal assist    Standing Unsupported, One Foot in Front  Needs help to step but can hold 15 seconds    Standing on One Leg  Tries to lift leg/unable to hold 3 seconds but remains standing independently    Total Score  38    Berg comment:  Scores <45/56 indicate increased fall risk      Timed Up and Go Test   Normal TUG (seconds)  25.25    TUG Comments  Scores >13.5 sec indicate increased fall risk                Objective measurements completed on examination: See above findings.                PT Short Term Goals - 04/02/18 1624      PT SHORT TERM GOAL #1   Title  Pt will perform HEP with supervision, for improved balance, strength and gait.  TARGET 04/30/2018    Baseline  no current HEP    Time  4    Period  Weeks    Status  New    Target Date  04/30/18      PT SHORT TERM GOAL #2   Title  Pt will improve 5x sit<>stand to less than or equal to 13 seconds to demonstrate improved functional strength.    Baseline  5x sit<>stand 16 seconds    Time  4    Period  Weeks    Status  New    Target Date  04/30/18      PT SHORT TERM GOAL #3   Title  Pt will improve Berg Balance score to at least 43/56 for decreased fall risk.    Baseline  Berg 38/56  (Scores <45/56 indicate increased fall risk)    Time  4    Period  Weeks    Status  New    Target Date  04/30/18      PT SHORT TERM GOAL #4   Title  Pt will  improve TUG score to less than or equal to 20 seconds for decreased fall risk.    Baseline  TUG >13.5 sec indicates increased fall risk.    Time  4    Period  Weeks    Status  New    Target Date  04/30/18      PT SHORT TERM GOAL #5   Title  Pt will ambulate at least 50-100 ft, with cane/no assistive device, supervision, for improved household mobility.    Baseline  150 ft RW supervision    Time  4    Period  Weeks    Status  New    Target Date  04/30/18      Additional Short Term Goals   Additional Short Term Goals  Yes      PT SHORT TERM GOAL #6   Title  Pt/family will verbalize understanding of fall prevention in home environment.    Baseline  Fall risk per  TUG, gait velocity, Berg    Time  4    Period  Weeks    Status  New    Target Date  04/30/18        PT Long Term Goals - 04/02/18 1632      PT LONG TERM GOAL #1   Title  Pt will perform progressive HEP for improved strength, balance, gait.  TARGET 06/25/2018    Baseline  no current HEP    Time  12    Period  Weeks    Status  New    Target Date  06/25/18      PT LONG TERM GOAL #2   Title  Pt will improve 5x sit<>stand to less than or equal to 10 seconds for improved functional strength/transfer efficiency.    Baseline  5x:  16 sec at eval    Time  12    Period  Weeks    Status  New    Target Date  06/25/18      PT LONG TERM GOAL #3   Title  Pt will improve TUG score to less than or equal to 13.5 seconds for decreased fall risk.    Baseline  TUG 25.25 at eval    Time  12    Period  Weeks    Status  New    Target Date  06/25/18      PT LONG TERM GOAL #4   Title  Pt will improve Berg Balance score to at least 48/56 for decreased fall risk.    Baseline  Berg 38/56 at eval    Time  12    Period  Weeks    Status  New    Target Date  06/25/18      PT LONG TERM GOAL #5   Title  Pt will ambulate independently, 1000 ft, indoor and outdoor surfaces, no LOB for improved community gait.    Baseline  150 ft  Supervision RW    Time  12    Period  Weeks    Status  New    Target Date  06/25/18             Plan - 04/02/18 1613    Clinical Impression Statement  Pt is a 38 year old female who presents to [facility] following CVA1/11/2018.  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  Pt presents with decreased strength, decreased balance, decreased independent gait and mobility.  She is at fall risk per TUG, Berg and gait velocity measures.  She was independent prior to hospitalization.  She will benefit from skilled PT to address the above stated deficits to decrease fall risk, to improve functional mobility and independence.      Personal Factors and Comorbidities  Comorbidity 3+    Comorbidities  Turner syndrome, scoliosis with Harrington rod placement, pneumococcal meningitis and profound hearing loss    Examination-Activity Limitations  Stand;Transfers;Locomotion Level    Examination-Participation Restrictions  Community Activity    Stability/Clinical Decision Making  Evolving/Moderate complexity    Clinical Decision Making  Moderate    Rehab Potential  Good    PT Frequency  2x / week    PT Duration  12 weeks   plus eval; may be modified due to insurance limitations   PT Treatment/Interventions  ADLs/Self Care Home Management;DME Instruction;Gait training;Functional mobility training;Stair training;Therapeutic activities;Therapeutic exercise;Patient/family education;Neuromuscular re-education;Balance training    PT Next Visit Plan  Initiate HEP for functional strengthening, standing balance; gait training with lesser device than RW    Consulted and Agree with Plan of Care  Patient;Family member/caregiver    Family Member Consulted  Father, sister       Patient will benefit from skilled therapeutic intervention in order to improve the following deficits and impairments:  Abnormal gait,  Decreased balance, Decreased mobility, Decreased strength, Difficulty walking, Postural dysfunction  Visit Diagnosis: Other abnormalities of gait and mobility  Unsteadiness on feet  Muscle weakness (generalized)     Problem List Patient Active Problem List   Diagnosis Date Noted  . Sinusitis 03/03/2018  . 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 Cerebrovascular accident (CVA) ([place])   . Benign essential HTN   . Tachypnea   . Sensorineural hearing loss (SNHL) of both ears 01/31/2018  . Dyslipidemia 01/31/2018  . Acute ischemic stroke ([place]) 01/30/2018  . Pneumococcal bacteremia   . Pneumococcal meningitis   . Turner syndrome   . Hypothyroidism 01/15/2018  . Generalized anxiety disorder 01/15/2018  . Bicuspid aortic valve 04/06/2013  . HTN (hypertension) 04/06/2013    Charish Schroepfer W. 04/02/2018, 4:38 PM Frazier Butt., PT  [facility] [address] [place], [place], [address] Phone: [phone]   Fax:  [phone]  Name: Emma Martin MRN: [id number] Date of Birth: 12/18/1980

## 2018-04-02 NOTE — Patient Instructions (Addendum)
   Constant therapy app - free for 14 days  Games like: Clinical research associate (with someone to cue for decreased focus) Ruth chores: Sorting and/or folding clothes Putting dishes away

## 2018-04-05 ENCOUNTER — Encounter: Payer: Self-pay | Admitting: Physical Therapy

## 2018-04-05 ENCOUNTER — Ambulatory Visit: Payer: BLUE CROSS/BLUE SHIELD | Admitting: Physical Therapy

## 2018-04-05 ENCOUNTER — Ambulatory Visit: Payer: BLUE CROSS/BLUE SHIELD | Admitting: Occupational Therapy

## 2018-04-05 ENCOUNTER — Encounter: Payer: Self-pay | Admitting: Occupational Therapy

## 2018-04-05 DIAGNOSIS — R2681 Unsteadiness on feet: Secondary | ICD-10-CM

## 2018-04-05 DIAGNOSIS — M6281 Muscle weakness (generalized): Secondary | ICD-10-CM

## 2018-04-05 DIAGNOSIS — R41842 Visuospatial deficit: Secondary | ICD-10-CM | POA: Diagnosis not present

## 2018-04-05 DIAGNOSIS — R278 Other lack of coordination: Secondary | ICD-10-CM

## 2018-04-05 DIAGNOSIS — I69315 Cognitive social or emotional deficit following cerebral infarction: Secondary | ICD-10-CM | POA: Diagnosis not present

## 2018-04-05 DIAGNOSIS — R41840 Attention and concentration deficit: Secondary | ICD-10-CM

## 2018-04-05 DIAGNOSIS — R41844 Frontal lobe and executive function deficit: Secondary | ICD-10-CM | POA: Diagnosis not present

## 2018-04-05 DIAGNOSIS — R2689 Other abnormalities of gait and mobility: Secondary | ICD-10-CM | POA: Diagnosis not present

## 2018-04-05 DIAGNOSIS — R41841 Cognitive communication deficit: Secondary | ICD-10-CM | POA: Diagnosis not present

## 2018-04-05 NOTE — Therapy (Signed)
[facility] [address] [place], [place], [address] Phone: [phone]   Fax:  [phone]  Occupational Therapy Treatment  Patient Details  Name: Emma Martin MRN: [id number] Date of Birth: [birth year] Referring Provider (OT): Maurice Small, MD   Encounter Date: 04/05/2018  OT End of Session - 04/05/18 1114    Visit Number  2    Number of Visits  25    Date for OT Re-Evaluation  07/01/18    Authorization Type  BCBS    Authorization Time Period  60 VL combined PT/OT    OT Start Time  1106    OT Stop Time  1145    OT Time Calculation (min)  39 min    Activity Tolerance  Patient limited by lethargy    Behavior During Therapy  Flat affect       Past Medical History:  Diagnosis Date  . Bicuspid aortic valve    , mild aortic insufficiency. (No SBE prophylaxis needed)  . Complication of anesthesia   . Dyslipidemia   . Generalized anxiety disorder    , with obsessive-compulsive traits.  . Hypertension   . Hypothyroidism   . Mixed gonadal dysgenesis    , (additional Y-bearing cell line) for which she reportedly underwent bilateral gonadectomy (patient unaware; needs confirmation) due to malignancy potential.  . PONV (postoperative nausea and vomiting)   . Primary amenorrhea    , treated starting at age 27 or 10 with estrogen and progesterone  . Scoliosis    , treated with Harrington rods (1997) for stabilization.  . Seasonal allergic rhinitis   . Short stature    , previously treated with growth hormone age 6-15 years.  Radford Pax syndrome    , previously followed by pediatric endocrinologist Freddy Jaksch, MD at [facility] through her early 57s).    Past Surgical History:  Procedure Laterality Date  . TEE WITHOUT CARDIOVERSION N/A 01/19/2018   Procedure: TRANSESOPHAGEAL ECHOCARDIOGRAM (TEE);  Surgeon: Jerline Pain, MD;  [facility]COPY;  Service: Cardiovascular;  Laterality: N/A;  . TEE  WITHOUT CARDIOVERSION N/A 02/02/2018   Procedure: TRANSESOPHAGEAL ECHOCARDIOGRAM (TEE);  Surgeon: Elouise Munroe, MD;  Location: [place];  Service: Cardiology;  Laterality: N/A;    There were no vitals filed for this visit.  Subjective Assessment - 04/05/18 1113    Subjective   Pt reports that it is best to type questions/instructions for now (OT typed pt in word document during session, no family present)    Pertinent History  meningitis, CVA 01/30/2018    Currently in Pain?  Yes    Pain Score  2     Pain Location  Head    Pain Descriptors / Indicators  Headache    Pain Type  Chronic pain    Pain Onset  More than a month ago    Pain Frequency  Intermittent    Aggravating Factors   dehydration    Pain Relieving Factors  meds         72M simple visual scanning/letter cancellation with 100% accuracy with incr time and frequent pauses with decr attention; however, pt redirected to task without cueing.  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  Several pauses due to decr attention.  Functional reaching to place/remove clothespins with 1-8lb resistance on  vertical pole for incr coordination and strength with min cueing for LUE use.      OT Education - 04/05/18 1622    Education Details  Coordination HEP for LUE    Person(s) Educated  Patient    Methods  Explanation;Demonstration;Handout;Verbal cues    Comprehension  Verbalized understanding;Returned demonstration;Verbal cues required   typed cueing      OT Short Term Goals - 04/02/18 1600      OT SHORT TERM GOAL #1   Title  I with initial HEP    Baseline  dependent    Time  6    Period  Weeks    Status  New      OT SHORT TERM GOAL #2   Title  Pt will perform all basic ADLS modified independently    Baseline  supervision for bathing, dressing, shower  transfers    Time  6    Period  Weeks    Status  New      OT SHORT TERM GOAL #3   Title  Pt will perform cold meal prep modified indpendently and basic cooking task with min A    Baseline  dependent, pt only perform basic beverage prep    Time  6    Period  Weeks    Status  New      OT SHORT TERM GOAL #4   Title  Pt will perform basic home management modified I    Baseline  dependent    Time  6    Period  Weeks    Status  New      OT SHORT TERM GOAL #5   Title  Pt will demonstrate improved fine motor coordination as evidenced by decreasing 9 hole peg test by 5 secs for LUE.    Baseline  RUE 32.94 secs, LUE 58.46 secs    Time  6    Period  Weeks    Status  New      Additional Short Term Goals   Additional Short Term Goals  --      OT SHORT TERM GOAL #6   Title  Further assess visual perceptual skills and set goals prn    Time  6    Period  Weeks    Status  New        OT Long Term Goals - 04/02/18 1618      OT LONG TERM GOAL #1   Title  Pt will perform mod complex home managment modified independently.    Baseline  dependent    Time  12    Period  Weeks    Status  New      OT LONG TERM GOAL #2   Title  Pt will perform basic cooking with supervision.    Baseline  dependent    Time  12    Period  Weeks    Status  New      OT LONG TERM GOAL #3   Title  Pt will increase bilateral grip strength by 5 lbs for increased functional use during ADLs.    Baseline  RUE 35 lbs LUE 36 lbs    Time  12    Period  Weeks    Status  New      OT LONG TERM GOAL #4   Title  Pt will demonstrate adequate bilateral UE strength and endurance to retrieve a 3 lbs weight from overhead shelf with left and right UE's individually  without drops x 5  reps in standing modified independently.    Baseline  unable due to balance and decreased UE strength    Time  12    Period  Weeks    Status  New      OT LONG TERM GOAL #5   Title  Pt will demonstrate abilty to sequence a multi step  functional or home management task modified independently.    Baseline  Pt requires assist for sequencing/ organization    Time  12    Period  Weeks    Status  New      Long Term Additional Goals   Additional Long Term Goals  Yes      OT LONG TERM GOAL #6   Title  Pt/ family will verbalize understanding cogntive compensation/ adapted strategies for ADLs/IADLs.    Baseline  dependent    Time  12    Period  Weeks    Status  New            Plan - 04/05/18 1115    Clinical Impression Statement  Pt performed visual scanning with good accuracy, but noted decr attention to task and needed cueing for use of LUE.     OT Occupational Profile and History  Detailed Assessment- Review of Records and additional review of physical, cognitive, psychosocial history related to current functional performance    Occupational performance deficits (Please refer to evaluation for details):  ADL's;IADL's;Education;Social Participation;Leisure;Play;Work    Marketing executive / Function / Physical Skills  ADL;Dexterity;Flexibility;Hearing;ROM;Strength;Vision;IADL;FMC;Coordination;Balance;Decreased knowledge of precautions;Endurance;Gait;Pain;Sensation;UE functional use;Mobility;GMC;Fascial restriction;Cardiopulmonary status limiting activity    Cognitive Skills  Attention;Problem Solve;Memory;Safety Awareness;Sequencing;Energy/Drive;Perception    Rehab Potential  Fair    Clinical Decision Making  Several treatment options, min-mod task modification necessary    Comorbidities Affecting Occupational Performance:  May have comorbidities impacting occupational performance    Modification or Assistance to Complete Evaluation   Min-Moderate modification of tasks or assist with assess necessary to complete eval    OT Frequency  2x / week    OT Duration  12 weeks    OT Treatment/Interventions  Self-care/ADL training;Fluidtherapy;Therapeutic exercise;Energy conservation;Gait Training;Functional Runner, broadcasting/film/video;Therapeutic activities;Visual/perceptual remediation/compensation;Balance training;Patient/family education;Cognitive remediation/compensation;Passive range of motion;Manual Therapy;DME and/or AE instruction;Neuromuscular education;Paraffin;Ultrasound;Moist Heat;Aquatic Therapy    Plan  simple IADL tasks, LUE functional use    Consulted and Agree with Plan of Care  Patient;Family member/caregiver    Family Member Consulted  father Karle Starch, sister Shanine       Patient will benefit from skilled therapeutic intervention in order to improve the following deficits and impairments:  Body Structure / Function / Physical Skills, Cognitive Skills  Visit Diagnosis: Attention and concentration deficit  Visuospatial deficit  Frontal lobe and executive function deficit  Other lack of coordination  Muscle weakness (generalized)    Problem List Patient Active Problem List   Diagnosis Date Noted  . Sinusitis 03/03/2018  . 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 Cerebrovascular accident (CVA) ([place])   . Benign essential HTN   . Tachypnea   . Sensorineural hearing loss (SNHL) of both ears 01/31/2018  . Dyslipidemia 01/31/2018  . Acute ischemic stroke ([place]) 01/30/2018  . Pneumococcal bacteremia   . Pneumococcal meningitis   . Turner syndrome   . Hypothyroidism 01/15/2018  . Generalized anxiety disorder 01/15/2018  . Bicuspid aortic valve 04/06/2013  . HTN (hypertension) 04/06/2013    [facility] 04/05/2018, 4:22 PM  [facility] [address] [place], [place], [address] Phone: [phone]   Fax:  [place]  Name: Emma Martin MRN: [id number] Date of Birth: 01-05-1981   Vianne Bulls, OTR/L [facility] [address] [place], [place]  [address] [phone] phone [phone] 04/05/18 4:22 PM

## 2018-04-05 NOTE — Therapy (Signed)
[facility] [address] [place], [place], [address] Phone: [phone]   Fax:  [phone]  Physical Therapy Treatment  Patient Details  Name: Emma Martin MRN: [id number] Date of Birth: [birth year] Referring Provider (PT): Maurice Small, MD   Encounter Date: 04/05/2018  PT End of Session - 04/05/18 1722    Visit Number  2    Number of Visits  25    Date for PT Re-Evaluation  07/01/18    Authorization Type  BCBS, Medicaid    Authorization - Visit Number  1    Authorization - Number of Visits  30    PT Start Time  1019    PT Stop Time  1103    PT Time Calculation (min)  44 min    Equipment Utilized During Treatment  Gait belt    Activity Tolerance  Patient tolerated treatment well    Behavior During Therapy  WFL for tasks assessed/performed       Past Medical History:  Diagnosis Date  . Bicuspid aortic valve    , mild aortic insufficiency. (No SBE prophylaxis needed)  . Complication of anesthesia   . Dyslipidemia   . Generalized anxiety disorder    , with obsessive-compulsive traits.  . Hypertension   . Hypothyroidism   . Mixed gonadal dysgenesis    , (additional Y-bearing cell line) for which she reportedly underwent bilateral gonadectomy (patient unaware; needs confirmation) due to malignancy potential.  . PONV (postoperative nausea and vomiting)   . Primary amenorrhea    , treated starting at age 39 or 82 with estrogen and progesterone  . Scoliosis    , treated with Harrington rods (1997) for stabilization.  . Seasonal allergic rhinitis   . Short stature    , previously treated with growth hormone age 75-15 years.  Radford Pax syndrome    , previously followed by pediatric endocrinologist Freddy Jaksch, MD at [facility] through her early 70s).    Past Surgical History:  Procedure Laterality Date  . TEE WITHOUT CARDIOVERSION N/A 01/19/2018   Procedure: TRANSESOPHAGEAL ECHOCARDIOGRAM (TEE);   Surgeon: Jerline Pain, MD;  Location: [facility] ENDOSCOPY;  Service: Cardiovascular;  Laterality: N/A;  . TEE WITHOUT CARDIOVERSION N/A 02/02/2018   Procedure: TRANSESOPHAGEAL ECHOCARDIOGRAM (TEE);  Surgeon: Elouise Munroe, MD;  Location: [facility];  Service: Cardiology;  Laterality: N/A;    There were no vitals filed for this visit.  Subjective Assessment - 04/05/18 1022    Subjective  Per Dad, no changes over the weekend.  Per pt, she had a good weekend.  (For communication this session, as dad had to run errands-PT typed conversation in [place] document, pt answered appropriately)    Patient is accompained by:  --   Dad-at beginning of session   Patient Stated Goals  Want to feel stronger moving around, to not depend on walker.    Currently in Pain?  Yes    Pain Score  3     Pain Orientation  Anterior    Pain Descriptors / Indicators  Headache    Pain Type  Chronic pain    Pain Onset  More than a month ago    Pain Frequency  Intermittent    Aggravating Factors   unsure    Pain Relieving Factors  take meds, more water                       [facility] Adult PT Treatment/Exercise - 04/05/18  0001      High Level Balance   High Level Balance Activities  Side stepping;Tandem walking;Marching forwards   2 laps at counter, 1-2 UE support   High Level Balance Comments  SLS at counter, 3 reps 10 seconds (progressing bilateral>1 UE support); tandem stance, 3 reps 30 seconds, 1>no UE support with min guard.      Exercises   Exercises  Knee/Hip      Knee/Hip Exercises: Aerobic   Nustep  Level 2, 4 extremities x 8 minutes, for leg strengthening      Knee/Hip Exercises: Standing   Heel Raises  Both;1 set;10 reps;3 seconds   Toe raises, 10 reps, 3 second hold   Hip Flexion  Stengthening;Right;Left;1 set;10 reps;Knee straight   Then Marching in place x 10 reps   Hip Abduction  Stengthening;Right;Left;1 set;10 reps;Knee straight    Functional Squat  1 set;10 reps      Knee/Hip  Exercises: Seated   Sit to Sand  1 set;10 reps;without UE support   from 18" surface            PT Education - 04/05/18 1720    Education Details  Initiated HEP through [facility] and showed HEP packet to patient (she would prefer to have dad present for HEP education next visit)    Person(s) Educated  Patient    Methods  Explanation;Demonstration;Handout    Comprehension  Verbalized understanding;Verbal cues required;Returned demonstration;Need further instruction;Other (comment)   Will need to go over again with pt (with HEP pictures and dad present and then issue HEP)      PT Short Term Goals - 04/02/18 1624      PT SHORT TERM GOAL #1   Title  Pt will perform HEP with supervision, for improved balance, strength and gait.  TARGET 04/30/2018    Baseline  no current HEP    Time  4    Period  Weeks    Status  New    Target Date  04/30/18      PT SHORT TERM GOAL #2   Title  Pt will improve 5x sit<>stand to less than or equal to 13 seconds to demonstrate improved functional strength.    Baseline  5x sit<>stand 16 seconds    Time  4    Period  Weeks    Status  New    Target Date  04/30/18      PT SHORT TERM GOAL #3   Title  Pt will improve Berg Balance score to at least 43/56 for decreased fall risk.    Baseline  Berg 38/56  (Scores <45/56 indicate increased fall risk)    Time  4    Period  Weeks    Status  New    Target Date  04/30/18      PT SHORT TERM GOAL #4   Title  Pt will improve TUG score to less than or equal to 20 seconds for decreased fall risk.    Baseline  TUG >13.5 sec indicates increased fall risk.    Time  4    Period  Weeks    Status  New    Target Date  04/30/18      PT SHORT TERM GOAL #5   Title  Pt will ambulate at least 50-100 ft, with cane/no assistive device, supervision, for improved household mobility.    Baseline  150 ft RW supervision    Time  4    Period  Weeks    Status  New    Target Date  04/30/18      Additional Short Term Goals    Additional Short Term Goals  Yes      PT SHORT TERM GOAL #6   Title  Pt/family will verbalize understanding of fall prevention in home environment.    Baseline  Fall risk per TUG, gait velocity, Berg    Time  4    Period  Weeks    Status  New    Target Date  04/30/18        PT Long Term Goals - 04/02/18 1632      PT LONG TERM GOAL #1   Title  Pt will perform progressive HEP for improved strength, balance, gait.  TARGET 06/25/2018    Baseline  no current HEP    Time  12    Period  Weeks    Status  New    Target Date  06/25/18      PT LONG TERM GOAL #2   Title  Pt will improve 5x sit<>stand to less than or equal to 10 seconds for improved functional strength/transfer efficiency.    Baseline  5x:  16 sec at eval    Time  12    Period  Weeks    Status  New    Target Date  06/25/18      PT LONG TERM GOAL #3   Title  Pt will improve TUG score to less than or equal to 13.5 seconds for decreased fall risk.    Baseline  TUG 25.25 at eval    Time  12    Period  Weeks    Status  New    Target Date  06/25/18      PT LONG TERM GOAL #4   Title  Pt will improve Berg Balance score to at least 48/56 for decreased fall risk.    Baseline  Berg 38/56 at eval    Time  12    Period  Weeks    Status  New    Target Date  06/25/18      PT LONG TERM GOAL #5   Title  Pt will ambulate independently, 1000 ft, indoor and outdoor surfaces, no LOB for improved community gait.    Baseline  150 ft Supervision RW    Time  12    Period  Weeks    Status  New    Target Date  06/25/18            Plan - 04/05/18 1723    Clinical Impression Statement  Skilled PT session focused on standing strengthening and balance exercises to initiate HEP.  Pt's father reports most of HEP at home has been seated exercises.  Pt able to perform standing strengthening/balance exercises at counter with 1-2 UE support, with one seated rest break, prior to lower extremity strengthening on NuStep.  Initiated HEP  through [facility] (code on printout); however, pt requested waiting until father present to provide packet and start HEP at home.    Personal Factors and Comorbidities  Comorbidity 3+    Comorbidities  Turner syndrome, scoliosis with Harrington rod placement, pneumococcal meningitis and profound hearing loss    Examination-Activity Limitations  Stand;Transfers;Locomotion Level    Examination-Participation Restrictions  Community Activity    Stability/Clinical Decision Making  Evolving/Moderate complexity    Rehab Potential  Good    PT Frequency  2x / week    PT Duration  12 weeks   plus eval; may  be modified due to insurance limitations   PT Treatment/Interventions  ADLs/Self Care Home Management;DME Instruction;Gait training;Functional mobility training;Stair training;Therapeutic activities;Therapeutic exercise;Patient/family education;Neuromuscular re-education;Balance training    PT Next Visit Plan  Go over HEP packet (on Garv Kuechle's desk) from [facility] and initiate for home; additional standing balance, functional strengthening, and gait training with lesser device than RW    Consulted and Agree with Plan of Care  Patient    Family Member Consulted          Patient will benefit from skilled therapeutic intervention in order to improve the following deficits and impairments:  Abnormal gait, Decreased balance, Decreased mobility, Decreased strength, Difficulty walking, Postural dysfunction  Visit Diagnosis: Muscle weakness (generalized)  Unsteadiness on feet     Problem List Patient Active Problem List   Diagnosis Date Noted  . Sinusitis 03/03/2018  . Right thalamic infarction ([place]) 02/03/2018  . Cerebrovascular accident (CVA) ([place])   . Benign essential HTN   . Tachypnea   . Sensorineural hearing loss (SNHL) of both ears 01/31/2018  . Dyslipidemia 01/31/2018  . Acute ischemic stroke ([place]) 01/30/2018  . Pneumococcal bacteremia   . Pneumococcal meningitis   . Turner syndrome   .  Hypothyroidism 01/15/2018  . Generalized anxiety disorder 01/15/2018  . Bicuspid aortic valve 04/06/2013  . HTN (hypertension) 04/06/2013    Aniayah Alaniz W. 04/05/2018, 5:29 PM  Frazier Butt., PT   [facility] [address] [place], [place], [address] Phone: [phone]   Fax:  [phone]  Name: Emma Martin MRN: [id number] Date of Birth: 13-May-1980

## 2018-04-05 NOTE — Patient Instructions (Signed)
  Coordination Activities  Perform the following activities for 15 minutes 2 times per day with left hand(s).   Rotate ball in fingertips (clockwise and counter-clockwise).   Flip cards 1 at a time as fast as you can.   Deal cards with your thumb (Hold deck in hand and push card off top with thumb).   Pick up coins and stack.   Pick up coins one at a time until you get 5-10 in your hand, then move coins from palm to fingertips to stack one at a time.

## 2018-04-06 ENCOUNTER — Ambulatory Visit: Payer: BLUE CROSS/BLUE SHIELD | Admitting: Physical Therapy

## 2018-04-06 ENCOUNTER — Ambulatory Visit: Payer: BLUE CROSS/BLUE SHIELD | Admitting: Occupational Therapy

## 2018-04-06 DIAGNOSIS — M6281 Muscle weakness (generalized): Secondary | ICD-10-CM | POA: Diagnosis not present

## 2018-04-06 DIAGNOSIS — R41842 Visuospatial deficit: Secondary | ICD-10-CM | POA: Diagnosis not present

## 2018-04-06 DIAGNOSIS — I69315 Cognitive social or emotional deficit following cerebral infarction: Secondary | ICD-10-CM | POA: Diagnosis not present

## 2018-04-06 DIAGNOSIS — R2681 Unsteadiness on feet: Secondary | ICD-10-CM

## 2018-04-06 DIAGNOSIS — R41844 Frontal lobe and executive function deficit: Secondary | ICD-10-CM | POA: Diagnosis not present

## 2018-04-06 DIAGNOSIS — R278 Other lack of coordination: Secondary | ICD-10-CM | POA: Diagnosis not present

## 2018-04-06 DIAGNOSIS — R2689 Other abnormalities of gait and mobility: Secondary | ICD-10-CM

## 2018-04-06 DIAGNOSIS — R41841 Cognitive communication deficit: Secondary | ICD-10-CM | POA: Diagnosis not present

## 2018-04-06 DIAGNOSIS — R41840 Attention and concentration deficit: Secondary | ICD-10-CM | POA: Diagnosis not present

## 2018-04-06 NOTE — Patient Instructions (Signed)
Access Code: [email or web address]  URL: [email or web address]  Date: 04/05/2018  Prepared by: Elsie Ra   Exercises  Standing Marching - 10 reps - 2-3 sets - 1x daily - 5x weekly  Standing Hip Abduction with Counter Support - 10 reps - 2-3 sets - 1x daily - 5x weekly  Standing Hip Extension with Counter Support - 10 reps - 2-3 sets - 1x daily - 5x weekly  Standing Tandem Balance with Counter Support - 3 reps - 1 sets - 10 sec hold - 1x daily - 5x weekly  Single Leg Stance with Support - 3 reps - 1 sets - 10 sec hold - 1x daily - 5x weekly  Heel Toe Raises with Counter Support - 10 reps - 2-3 sets - 3 esc hold - 1x daily - 5x weekly  Heel Toe Raises with Unilateral Counter Support - 10 reps - 2-3 sets - 3 sec hold - 1x daily - 5x weekly  Mini Squat with Counter Support - 10 reps - 2-3 sets - 1x daily - 5x weekly  Sit to Stand - 10 reps - 2-3 sets - 1x daily - 5x weekly

## 2018-04-06 NOTE — Patient Instructions (Signed)
            Cane Overhead - Sitting   Perform in seated position, With arms straight, hold cane forward at waist. Raise cane above head. Hold 3 seconds. Repeat 10 times. Do 2 times per day.    SHOULDER: Abduction / Adduction (Cane)    Hold cane with both hands. Raise arms out to one side. Repeat to other side. Hold ___ seconds each side. No weight on cane or stick _10__ reps per set, 1-2___ sets per day, __3-4_ days per week  Copyright  VHI. All rights reserved.    http://orth.exer.us/744   Copyright  VHI. All rights reserved.  1. Grip Strengthening (Resistive Putty)   Squeeze putty using thumb and all fingers. Repeat _20___ times. Do __2__ sessions per day.   2. Roll putty into tube on table and pinch between each finger and thumb x 10 reps each. (can do ring and small finger together)     Copyright  VHI. All rights reserved.

## 2018-04-06 NOTE — Therapy (Addendum)
[facility] [address] [place], [place], [address] Phone: [phone]   Fax:  [phone]  Occupational Therapy Treatment  Patient Details  Name: Emma Martin MRN: [id number] Date of Birth: [birth year] Referring Provider (OT): Maurice Small, MD   Encounter Date: 04/06/2018  OT End of Session - 04/06/18 1124    Visit Number  3    Number of Visits  25    Date for OT Re-Evaluation  07/01/18    Authorization Type  BCBS    Authorization Time Period  60 VL combined PT/OT counts as 2 visits if seen on same day    OT Start Time  1104    OT Stop Time  1145    OT Time Calculation (min)  41 min    Behavior During Therapy  [facility] for tasks assessed/performed       Past Medical History:  Diagnosis Date  . Bicuspid aortic valve    , mild aortic insufficiency. (No SBE prophylaxis needed)  . Complication of anesthesia   . Dyslipidemia   . Generalized anxiety disorder    , with obsessive-compulsive traits.  . Hypertension   . Hypothyroidism   . Mixed gonadal dysgenesis    , (additional Y-bearing cell line) for which she reportedly underwent bilateral gonadectomy (patient unaware; needs confirmation) due to malignancy potential.  . PONV (postoperative nausea and vomiting)   . Primary amenorrhea    , treated starting at age 24 or 55 with estrogen and progesterone  . Scoliosis    , treated with Harrington rods (1997) for stabilization.  . Seasonal allergic rhinitis   . Short stature    , previously treated with growth hormone age 65-15 years.  Radford Pax syndrome    , previously followed by pediatric endocrinologist Freddy Jaksch, MD at [facility] through her early 55s).    Past Surgical History:  Procedure Laterality Date  . TEE WITHOUT CARDIOVERSION N/A 01/19/2018   Procedure: TRANSESOPHAGEAL ECHOCARDIOGRAM (TEE);  Surgeon: Jerline Pain, MD;  Location: [facility] ENDOSCOPY;  Service: Cardiovascular;  Laterality: N/A;  .  TEE WITHOUT CARDIOVERSION N/A 02/02/2018   Procedure: TRANSESOPHAGEAL ECHOCARDIOGRAM (TEE);  Surgeon: Elouise Munroe, MD;  Location: [facility];  Service: Cardiology;  Laterality: N/A;    There were no vitals filed for this visit.  Subjective Assessment - 04/06/18 1139    Pertinent History  meningitis, CVA 01/30/2018    Currently in Pain?  No/denies            Treatment: Pt was instructed in HEP, min-mod v.c- see pt instructions-therapist had pt attempt theraband exercises for shoulder abduction and biceps curls however pt was unable to maintain good form therefore pt did not issue as HEP. Pt requires increased time and repetion of all exercises due to cognitive deficits. Therapist wrote out or demonstrated all exercises. Arm bike x 5 mins level 3 for conditioning, min v.c                 OT Short Term Goals - 04/02/18 1600      OT SHORT TERM GOAL #1   Title  I with initial HEP    Baseline  dependent    Time  6    Period  Weeks    Status  New      OT SHORT TERM GOAL #2   Title  Pt will perform all basic ADLS modified independently    Baseline  supervision for bathing, dressing, shower transfers  Time  6    Period  Weeks    Status  New      OT SHORT TERM GOAL #3   Title  Pt will perform cold meal prep modified indpendently and basic cooking task with min A    Baseline  dependent, pt only perform basic beverage prep    Time  6    Period  Weeks    Status  New      OT SHORT TERM GOAL #4   Title  Pt will perform basic home management modified I    Baseline  dependent    Time  6    Period  Weeks    Status  New      OT SHORT TERM GOAL #5   Title  Pt will demonstrate improved fine motor coordination as evidenced by decreasing 9 hole peg test by 5 secs for LUE.    Baseline  RUE 32.94 secs, LUE 58.46 secs    Time  6    Period  Weeks    Status  New      Additional Short Term Goals   Additional Short Term Goals  --      OT SHORT TERM GOAL #6    Title  Further assess visual perceptual skills and set goals prn    Time  6    Period  Weeks    Status  New        OT Long Term Goals - 04/02/18 1618      OT LONG TERM GOAL #1   Title  Pt will perform mod complex home managment modified independently.    Baseline  dependent    Time  12    Period  Weeks    Status  New      OT LONG TERM GOAL #2   Title  Pt will perform basic cooking with supervision.    Baseline  dependent    Time  12    Period  Weeks    Status  New      OT LONG TERM GOAL #3   Title  Pt will increase bilateral grip strength by 5 lbs for increased functional use during ADLs.    Baseline  RUE 35 lbs LUE 36 lbs    Time  12    Period  Weeks    Status  New      OT LONG TERM GOAL #4   Title  Pt will demonstrate adequate bilateral UE strength and endurance to retrieve a 3 lbs weight from overhead shelf with left and right UE's individually  without drops x 5 reps in standing modified independently.    Baseline  unable due to balance and decreased UE strength    Time  12    Period  Weeks    Status  New      OT LONG TERM GOAL #5   Title  Pt will demonstrate abilty to sequence a multi step functional or home management task modified independently.    Baseline  Pt requires assist for sequencing/ organization    Time  12    Period  Weeks    Status  New      Long Term Additional Goals   Additional Long Term Goals  Yes      OT LONG TERM GOAL #6   Title  Pt/ family will verbalize understanding cogntive compensation/ adapted strategies for ADLs/IADLs.    Baseline  dependent    Time  12  Period  Weeks    Status  New            Plan - 04/06/18 1301    Clinical Impression Statement  Pt is progressing towards goals for LUE functional use, pt returned demonstration of HEP with v.c.    Body Structure / Function / Physical Skills  ADL;Dexterity;Flexibility;Hearing;ROM;Strength;Vision;IADL;FMC;Coordination;Balance;Decreased knowledge of  precautions;Endurance;Gait;Pain;Sensation;UE functional use;Mobility;GMC;Fascial restriction;Cardiopulmonary status limiting activity    Rehab Potential  Fair    OT Frequency  2x / week    OT Duration  12 weeks    OT Treatment/Interventions  Self-care/ADL training;Fluidtherapy;Therapeutic exercise;Energy conservation;Gait Training;Functional Furniture conservator/restorer;Therapeutic activities;Visual/perceptual remediation/compensation;Balance training;Patient/family education;Cognitive remediation/compensation;Passive range of motion;Manual Therapy;DME and/or AE instruction;Neuromuscular education;Paraffin;Ultrasound;Moist Heat;Aquatic Therapy    Plan  simple IADL tasks, LUE functional use    Consulted and Agree with Plan of Care  Patient       Patient will benefit from skilled therapeutic intervention in order to improve the following deficits and impairments:     Visit Diagnosis: Other lack of coordination  Muscle weakness (generalized)  Frontal lobe and executive function deficit    Problem List Patient Active Problem List   Diagnosis Date Noted  . Sinusitis 03/03/2018  . Right thalamic infarction ([place]) 02/03/2018  . Cerebrovascular accident (CVA) ([place])   . Benign essential HTN   . Tachypnea   . Sensorineural hearing loss (SNHL) of both ears 01/31/2018  . Dyslipidemia 01/31/2018  . Acute ischemic stroke ([place]) 01/30/2018  . Pneumococcal bacteremia   . Pneumococcal meningitis   . Turner syndrome   . Hypothyroidism 01/15/2018  . Generalized anxiety disorder 01/15/2018  . Bicuspid aortic valve 04/06/2013  . HTN (hypertension) 04/06/2013    Emma Martin 04/06/2018, 1:04 PM Theone Murdoch, OTR/L [phone] Phone: [phone] 1:10 PM 04/06/18 [facility] [address] [place], [place], [address] Phone: [phone]   Fax:  [phone]  Name: Emma Martin MRN: [id number] Date of Birth: 1980-09-20

## 2018-04-06 NOTE — Therapy (Signed)
[facility] [address] [place], [place], [address] Phone: [phone]   Fax:  [phone]  Physical Therapy Treatment  Patient Details  Name: Emma Martin MRN: [id number] Date of Birth: Sep 08, 1980 Referring Provider (PT): Maurice Small, MD   Encounter Date: 04/06/2018  PT End of Session - 04/06/18 0254    Visit Number  3    Number of Visits  25    Date for PT Re-Evaluation  07/01/18    Authorization Type  BCBS, Medicaid    Authorization - Visit Number  2    Authorization - Number of Visits  30    PT Start Time  2706    PT Stop Time  1230    PT Time Calculation (min)  45 min    Activity Tolerance  Patient tolerated treatment well    Behavior During Therapy  [facility] for tasks assessed/performed       Past Medical History:  Diagnosis Date  . Bicuspid aortic valve    , mild aortic insufficiency. (No SBE prophylaxis needed)  . Complication of anesthesia   . Dyslipidemia   . Generalized anxiety disorder    , with obsessive-compulsive traits.  . Hypertension   . Hypothyroidism   . Mixed gonadal dysgenesis    , (additional Y-bearing cell line) for which she reportedly underwent bilateral gonadectomy (patient unaware; needs confirmation) due to malignancy potential.  . PONV (postoperative nausea and vomiting)   . Primary amenorrhea    , treated starting at age 49 or 10 with estrogen and progesterone  . Scoliosis    , treated with Harrington rods (1997) for stabilization.  . Seasonal allergic rhinitis   . Short stature    , previously treated with growth hormone age 66-15 years.  Radford Pax syndrome    , previously followed by pediatric endocrinologist Freddy Jaksch, MD at [facility] through her early 34s).    Past Surgical History:  Procedure Laterality Date  . TEE WITHOUT CARDIOVERSION N/A 01/19/2018   Procedure: TRANSESOPHAGEAL ECHOCARDIOGRAM (TEE);  Surgeon: Jerline Pain, MD;  Location: [facility] ENDOSCOPY;   Service: Cardiovascular;  Laterality: N/A;  . TEE WITHOUT CARDIOVERSION N/A 02/02/2018   Procedure: TRANSESOPHAGEAL ECHOCARDIOGRAM (TEE);  Surgeon: Elouise Munroe, MD;  Location: [place];  Service: Cardiology;  Laterality: N/A;    There were no vitals filed for this visit.  Subjective Assessment - 04/06/18 1315    Subjective  no new complaints today, agrees to try ambulating with cane today    Patient Stated Goals  Want to feel stronger moving around, to not depend on walker.    Currently in Pain?  No/denies       Treatment today Exercises  Standing Marching - 10 reps - 2 sets  Standing Hip Abduction with Counter Support - 10 reps - 2 sets Standing Hip Extension with Counter Support - 10 reps - 2 sets  Standing Tandem Balance with intermit Counter Support - 30 sec X 2 bilat Single Leg Stance with one fingertip Support - 2 reps - 30 sec each bilat Heel Toe Raises with unilat Counter Support - 10 reps - 2 sets Mini Squat with Counter Support - 10 reps - 2 sets Sit to Stand without UE support - 10 reps - 2 sets Gait training with quad tip cane 50 ft X 2, with quad base cane 50 ft  X3 with CGA to min A and max multimodal cues for technique and appropriate reciprocal pattern and cane positioning  but inconsistent performance and stability.     PT Education - 04/06/18 1316    Education Details  HEP review    Person(s) Educated  Patient;Parent(s)    Methods  Explanation;Demonstration;Handout    Comprehension  Verbalized understanding;Returned demonstration       PT Short Term Goals - 04/02/18 1624      PT SHORT TERM GOAL #1   Title  Pt will perform HEP with supervision, for improved balance, strength and gait.  TARGET 04/30/2018    Baseline  no current HEP    Time  4    Period  Weeks    Status  New    Target Date  04/30/18      PT SHORT TERM GOAL #2   Title  Pt will improve 5x sit<>stand to less than or equal to 13 seconds to demonstrate improved functional strength.     Baseline  5x sit<>stand 16 seconds    Time  4    Period  Weeks    Status  New    Target Date  04/30/18      PT SHORT TERM GOAL #3   Title  Pt will improve Berg Balance score to at least 43/56 for decreased fall risk.    Baseline  Berg 38/56  (Scores <45/56 indicate increased fall risk)    Time  4    Period  Weeks    Status  New    Target Date  04/30/18      PT SHORT TERM GOAL #4   Title  Pt will improve TUG score to less than or equal to 20 seconds for decreased fall risk.    Baseline  TUG >13.5 sec indicates increased fall risk.    Time  4    Period  Weeks    Status  New    Target Date  04/30/18      PT SHORT TERM GOAL #5   Title  Pt will ambulate at least 50-100 ft, with cane/no assistive device, supervision, for improved household mobility.    Baseline  150 ft RW supervision    Time  4    Period  Weeks    Status  New    Target Date  04/30/18      Additional Short Term Goals   Additional Short Term Goals  Yes      PT SHORT TERM GOAL #6   Title  Pt/family will verbalize understanding of fall prevention in home environment.    Baseline  Fall risk per TUG, gait velocity, Berg    Time  4    Period  Weeks    Status  New    Target Date  04/30/18        PT Long Term Goals - 04/02/18 1632      PT LONG TERM GOAL #1   Title  Pt will perform progressive HEP for improved strength, balance, gait.  TARGET 06/25/2018    Baseline  no current HEP    Time  12    Period  Weeks    Status  New    Target Date  06/25/18      PT LONG TERM GOAL #2   Title  Pt will improve 5x sit<>stand to less than or equal to 10 seconds for improved functional strength/transfer efficiency.    Baseline  5x:  16 sec at eval    Time  12    Period  Weeks    Status  New  Target Date  06/25/18      PT LONG TERM GOAL #3   Title  Pt will improve TUG score to less than or equal to 13.5 seconds for decreased fall risk.    Baseline  TUG 25.25 at eval    Time  12    Period  Weeks    Status  New     Target Date  06/25/18      PT LONG TERM GOAL #4   Title  Pt will improve Berg Balance score to at least 48/56 for decreased fall risk.    Baseline  Berg 38/56 at eval    Time  12    Period  Weeks    Status  New    Target Date  06/25/18      PT LONG TERM GOAL #5   Title  Pt will ambulate independently, 1000 ft, indoor and outdoor surfaces, no LOB for improved community gait.    Baseline  150 ft Supervision RW    Time  12    Period  Weeks    Status  New    Target Date  06/25/18            Plan - 04/06/18 1317    Clinical Impression Statement  Session focused on reviewing HEP and she was able to to show good return demonstration. Gait training then peformed with quad tip cane first after enduction and demonstration. She shows poor technique and stability so then quad base cane trialed which allowed her to improve stabilty but techniique still difficult as she mixes up reciprocal pattern places cane down too far in front of her. She is overall CGA but did require min A at times with cane for balance so she still needs to use RW at home for now until balance and technique can improve.     Personal Factors and Comorbidities  Comorbidity 3+    Comorbidities  Turner syndrome, scoliosis with Harrington rod placement, pneumococcal meningitis and profound hearing loss    Examination-Activity Limitations  Stand;Transfers;Locomotion Level    Examination-Participation Restrictions  Community Activity    Stability/Clinical Decision Making  Evolving/Moderate complexity    Rehab Potential  Good    PT Frequency  2x / week    PT Duration  12 weeks    PT Treatment/Interventions  ADLs/Self Care Home Management;DME Instruction;Gait training;Functional mobility training;Stair training;Therapeutic activities;Therapeutic exercise;Patient/family education;Neuromuscular re-education;Balance training    PT Next Visit Plan  additional standing balance, functional strengthening, and gait training with lesser  device than RW    PT Home Exercise Plan  [email or web address]    Consulted and Agree with Plan of Care  Patient       Patient will benefit from skilled therapeutic intervention in order to improve the following deficits and impairments:  Abnormal gait, Decreased balance, Decreased mobility, Decreased strength, Difficulty walking, Postural dysfunction  Visit Diagnosis: Other abnormalities of gait and mobility  Unsteadiness on feet  Muscle weakness (generalized)     Problem List Patient Active Problem List   Diagnosis Date Noted  . Sinusitis 03/03/2018  . 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 Cerebrovascular accident (CVA) ([place])   . Benign essential HTN   . Tachypnea   . Sensorineural hearing loss (SNHL) of both ears 01/31/2018  . Dyslipidemia 01/31/2018  . Acute ischemic stroke ([place]) 01/30/2018  . Pneumococcal bacteremia   . Pneumococcal meningitis   . Turner syndrome   . Hypothyroidism 01/15/2018  . Generalized anxiety disorder 01/15/2018  . Bicuspid  aortic valve 04/06/2013  . HTN (hypertension) 04/06/2013    Silvestre Mesi 04/06/2018, 1:23 PM  [facility] [address] [place], [place], [address] Phone: [phone]   Fax:  [phone]  Name: SHARESE MANRIQUE MRN: [id number] Date of Birth: [birth year]

## 2018-04-09 ENCOUNTER — Ambulatory Visit: Payer: BLUE CROSS/BLUE SHIELD

## 2018-04-12 DIAGNOSIS — G039 Meningitis, unspecified: Secondary | ICD-10-CM | POA: Diagnosis not present

## 2018-04-12 DIAGNOSIS — G001 Pneumococcal meningitis: Secondary | ICD-10-CM | POA: Diagnosis not present

## 2018-04-12 DIAGNOSIS — I639 Cerebral infarction, unspecified: Secondary | ICD-10-CM | POA: Diagnosis not present

## 2018-04-12 DIAGNOSIS — R7881 Bacteremia: Secondary | ICD-10-CM | POA: Diagnosis not present

## 2018-04-13 ENCOUNTER — Ambulatory Visit: Payer: BLUE CROSS/BLUE SHIELD | Admitting: Occupational Therapy

## 2018-04-13 ENCOUNTER — Telehealth: Payer: Self-pay | Admitting: Physical Therapy

## 2018-04-13 NOTE — Telephone Encounter (Signed)
Emma Martin was contacted today regarding the temporary closing of [facility] due to Covid-19, with therapist speaking to her father. Therapist discussed:  Importance of continuing to do her HEP daily at home.  Father reports no additional questions at this time.  Patient is potentially interested in further information for an e-visit, virtual check in, or telehealth visit, if those services become available.    [facility] will follow up with patients when we are able to resume care.  Mady Haagensen, [place] [address] [place], [place]  [address] Phone:  [phone] Fax:  [phone] \

## 2018-04-15 ENCOUNTER — Ambulatory Visit: Payer: BLUE CROSS/BLUE SHIELD | Admitting: Physical Therapy

## 2018-04-15 ENCOUNTER — Ambulatory Visit: Payer: BLUE CROSS/BLUE SHIELD | Admitting: Speech Pathology

## 2018-04-20 ENCOUNTER — Ambulatory Visit: Payer: BLUE CROSS/BLUE SHIELD | Admitting: Physical Therapy

## 2018-04-20 ENCOUNTER — Encounter: Payer: BLUE CROSS/BLUE SHIELD | Admitting: Speech Pathology

## 2018-04-22 ENCOUNTER — Telehealth: Payer: Self-pay | Admitting: Physical Therapy

## 2018-04-22 DIAGNOSIS — D4102 Neoplasm of uncertain behavior of left kidney: Secondary | ICD-10-CM | POA: Diagnosis not present

## 2018-04-22 NOTE — Telephone Encounter (Signed)
Emma Martin was contacted today regarding temporary reduction of Outpatient [facility] due to concerns for community transmission of COVID-19. 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 Assessed if patient needed to be seen in person by clinician (recent fall or acute injury that requires hands on assessment and advice, change in diet order, post-surgical, special cases, etc.).    Per father, Patient did not have an acute/special need that requires in person visit. Proceeded with phone call.  Therapist advised the patient to continue to perform her HEP and assured she had no unanswered questions or concerns at this time.  The patient's father expressed interest in being contacted for an E-Visit, virtual check in, or Telehealth visit to update HEP, when those services become available and if a chat box or written instructions could be utilized to communicate with patient.  [facility] will follow up with patient at that time.  Patient is aware we can be reached by telephone during limited business hours in the meantime.   Rico Junker, PT, DPT 04/22/18    11:35 AM

## 2018-04-23 ENCOUNTER — Encounter: Payer: BLUE CROSS/BLUE SHIELD | Admitting: Occupational Therapy

## 2018-04-27 ENCOUNTER — Ambulatory Visit (HOSPITAL_COMMUNITY)
Admission: RE | Admit: 2018-04-27 | Discharge: 2018-04-27 | Disposition: A | Discharge status: Home / Self Care | Payer: BLUE CROSS/BLUE SHIELD | Source: Ambulatory Visit | Attending: Urology | Admitting: Urology

## 2018-04-27 ENCOUNTER — Encounter: Payer: BLUE CROSS/BLUE SHIELD | Admitting: Occupational Therapy

## 2018-04-27 ENCOUNTER — Other Ambulatory Visit: Payer: Self-pay

## 2018-04-27 ENCOUNTER — Other Ambulatory Visit (HOSPITAL_COMMUNITY): Payer: Self-pay | Admitting: Urology

## 2018-04-27 DIAGNOSIS — D49512 Neoplasm of unspecified behavior of left kidney: Secondary | ICD-10-CM | POA: Diagnosis not present

## 2018-04-27 DIAGNOSIS — D4102 Neoplasm of uncertain behavior of left kidney: Secondary | ICD-10-CM

## 2018-04-27 DIAGNOSIS — R93422 Abnormal radiologic findings on diagnostic imaging of left kidney: Secondary | ICD-10-CM | POA: Diagnosis not present

## 2018-04-27 DIAGNOSIS — I10 Essential (primary) hypertension: Secondary | ICD-10-CM | POA: Diagnosis not present

## 2018-04-27 DIAGNOSIS — C642 Malignant neoplasm of left kidney, except renal pelvis: Secondary | ICD-10-CM | POA: Diagnosis not present

## 2018-04-29 DIAGNOSIS — D49512 Neoplasm of unspecified behavior of left kidney: Secondary | ICD-10-CM | POA: Diagnosis not present

## 2018-04-29 DIAGNOSIS — R8279 Other abnormal findings on microbiological examination of urine: Secondary | ICD-10-CM | POA: Diagnosis not present

## 2018-04-29 DIAGNOSIS — R8271 Bacteriuria: Secondary | ICD-10-CM | POA: Diagnosis not present

## 2018-05-03 ENCOUNTER — Ambulatory Visit: Payer: BLUE CROSS/BLUE SHIELD | Attending: Family Medicine | Admitting: Physical Therapy

## 2018-05-03 ENCOUNTER — Encounter: Payer: Self-pay | Admitting: Physical Therapy

## 2018-05-03 ENCOUNTER — Ambulatory Visit: Payer: BLUE CROSS/BLUE SHIELD | Admitting: Physical Therapy

## 2018-05-03 DIAGNOSIS — R2689 Other abnormalities of gait and mobility: Secondary | ICD-10-CM | POA: Insufficient documentation

## 2018-05-03 DIAGNOSIS — R2681 Unsteadiness on feet: Secondary | ICD-10-CM

## 2018-05-03 DIAGNOSIS — R41841 Cognitive communication deficit: Secondary | ICD-10-CM | POA: Insufficient documentation

## 2018-05-03 DIAGNOSIS — I69315 Cognitive social or emotional deficit following cerebral infarction: Secondary | ICD-10-CM | POA: Diagnosis not present

## 2018-05-03 DIAGNOSIS — M6281 Muscle weakness (generalized): Secondary | ICD-10-CM

## 2018-05-03 NOTE — Patient Instructions (Signed)
Access Code: [email or web address]  URL: [email or web address]  Date: 05/03/2018  Prepared by: Mady Haagensen   Exercises  Standing Marching - 10 reps - 2-3 sets - 1x daily - 5x weekly  Standing Hip Abduction with Counter Support - 10 reps - 2-3 sets - 1x daily - 5x weekly  Standing Hip Extension with Counter Support - 10 reps - 2-3 sets - 1x daily - 5x weekly  Standing Tandem Balance with Counter Support - 3 reps - 1 sets - 10 sec hold - 1x daily - 5x weekly  Single Leg Stance with Support - 3 reps - 1 sets - 10 sec hold - 1x daily - 5x weekly  Heel Toe Raises with Counter Support - 10 reps - 2-3 sets - 3 esc hold - 1x daily - 5x weekly  Heel Toe Raises with Unilateral Counter Support - 10 reps - 2-3 sets - 3 sec hold - 1x daily - 5x weekly  Mini Squat with Counter Support - 10 reps - 2-3 sets - 1x daily - 5x weekly  Sit to Stand - 10 reps - 2-3 sets - 1x daily - 5x weekly   Added 05/03/2018 Seated Hamstring Stretch - 3 reps - 1 sets - 30 hold - 2x daily - 6x weekly  Standing Gastroc Stretch on Step - 3 reps - 1 sets - 30 sec hold - 2x daily - 6x weekly  Standing Bilateral Gastroc Stretch with Step - 3 reps - 1 sets - 30 sec hold - 1x daily - 6x weekly  Seated Transversus Abdominis Bracing - 10 reps - 1 sets - 5 sec hold - 2x daily - 6x weekly  Seated Marching with Opposite Shoulder Flexion - 10 reps - 1 sets - 3 sec hold - 1x daily - 6x weekly

## 2018-05-03 NOTE — Therapy (Signed)
[facility] [address] [place], [place], [address] Phone: [phone]   Fax:  [phone]  Physical Therapy Treatment  Patient Details  Name: Emma Martin MRN: [id number] Date of Birth: [birth year] Referring Provider (PT): Maurice Small, MD   Encounter Date: 05/03/2018   Physical Therapy Telehealth Visit:  I connected with Emma Martin today at 1000 by Webex video conference and verified that I am speaking with the correct person using two identifiers.  I discussed the limitations, risks, security and privacy concerns of performing an evaluation and management service by Webex and the availability of in person appointments.  I also discussed with the patient that there may be a patient responsible charge related to this service. The patient expressed understanding and agreed to proceed.    The patient's address was confirmed.  Identified to the patient that therapist is a licensed physical therapist in the state of [place].  Verified phone # as [phone] to call in case of technical difficulties.     PT End of Session - 05/03/18 1133    Visit Number  4    Number of Visits  25    Date for PT Re-Evaluation  07/01/18    Authorization Type  BCBS, Medicaid (4th visit is telehealth)    Authorization - Visit Number  4    Authorization - Number of Visits  30    PT Start Time  1012    PT Stop Time  1100    PT Time Calculation (min)  48 min    Activity Tolerance  Patient tolerated treatment well    Behavior During Therapy  WFL for tasks assessed/performed       Past Medical History:  Diagnosis Date  . Bicuspid aortic valve    , mild aortic insufficiency. (No SBE prophylaxis needed)  . Complication of anesthesia   . Dyslipidemia   . Generalized anxiety disorder    , with obsessive-compulsive traits.  . Hypertension   . Hypothyroidism   . Mixed gonadal dysgenesis    , (additional Y-bearing cell line) for which  she reportedly underwent bilateral gonadectomy (patient unaware; needs confirmation) due to malignancy potential.  . PONV (postoperative nausea and vomiting)   . Primary amenorrhea    , treated starting at age 47 or 84 with estrogen and progesterone  . Scoliosis    , treated with Harrington rods (1997) for stabilization.  . Seasonal allergic rhinitis   . Short stature    , previously treated with growth hormone age 60-15 years.  Radford Pax syndrome    , previously followed by pediatric endocrinologist Freddy Jaksch, MD at [facility] through her early 24s).    Past Surgical History:  Procedure Laterality Date  . TEE WITHOUT CARDIOVERSION N/A 01/19/2018   Procedure: TRANSESOPHAGEAL ECHOCARDIOGRAM (TEE);  Surgeon: Jerline Pain, MD;  Location: [facility] ENDOSCOPY;  Service: Cardiovascular;  Laterality: N/A;  . TEE WITHOUT CARDIOVERSION N/A 02/02/2018   Procedure: TRANSESOPHAGEAL ECHOCARDIOGRAM (TEE);  Surgeon: Elouise Munroe, MD;  Location: [facility];  Service: Cardiology;  Laterality: N/A;    There were no vitals filed for this visit.  Subjective Assessment - 05/03/18 1114    Subjective  Dad reports that Emma Martin has been using cane for longer distances with his close supervision.  Emma Martin says she is doing well.  No falls; has been doing exercises about 2-3 times per week.    Patient is accompained by:  Family member   Dad   Patient Stated  Goals  Want to feel stronger moving around, to not depend on walker.    Currently in Pain?  No/denies       Reviewed the following exercises as part of HEP issued last face to face visit 04/06/2018:  Pt return demo understanding with dad's cues: Exercises   Standing Marching - 10 reps - 2 sets (Requires cues to slow pace)  Standing Hip Abduction with Counter Support - 10 reps (Cues for 3 sec hold)  Standing Hip Extension with Counter Support - 10 reps  (cues for 3 sec hold)  Standing Tandem Balance with intermit Counter Support - 30 sec X 2  bilat  Single Leg Stance with one fingertip Support - 2 reps - 30 sec each bilat  Heel Toe Raises with unilat Counter Support - 10 reps   Mini Squat with Counter Support - 10 reps   Sit to Stand without UE support - 10 reps                  [facility] Adult PT Treatment/Exercise - 05/03/18 0001      Ambulation/Gait   Ambulation/Gait  Yes    Ambulation/Gait Assistance  5: Supervision    Ambulation/Gait Assistance Details  Supervision of dad, in home, with PT observing via telehealth.  Cues provided for sequence of cane in R hand, with L foot.    Ambulation Distance (Feet)  15 Feet   x 3   Assistive device  Straight cane   with tripod base   Gait Pattern  Step-through pattern;Decreased stride length;Wide base of support    Ambulation Surface  Level;Indoor    Gait Comments  PT instructs patient to wear shoes or non-skid socks in home to avoid slipping/falls; cues for correct cane sequence.  Dad verbalizes understanding.      Exercises   Exercises  Knee/Hip;Other Exercises    Other Exercises   Seated core stability exercises (PT able to demo through webex video, then pt return demo):  abdominal activation x 3 reps, then alternating leg marching, cues to slow pace, then alternating UE lifts x 3 reps, then opposite arm/leg lifts x 3 reps each.      Knee/Hip Exercises: Stretches   Active Hamstring Stretch  Right;Left;2 reps;30 seconds   foot propped on floor   Gastroc Stretch  Right;Left;1 rep;10 seconds   propped at table leg   Gastroc Stretch Limitations  Pt demo propping foot at table leg and feels stretch; also instructed in how to perform on bottom step      Knee/Hip Exercises: Standing   Heel Raises Limitations  Attempted single limb heel raise, with pt not stable in this position    Functional Squat Limitations  Dad reports pt having trouble with squats at home:  PT demo, then pt return demo, wide BOS, squat to pick up object close to body and return to upright standing,  3 reps.             PT Education - 05/03/18 1124    Education Details  Additions to HEP-see instructions    Person(s) Educated  Patient;Parent(s)   Dad   Methods  Explanation;Demonstration;Handout;Verbal cues    Comprehension  Verbalized understanding;Returned demonstration;Verbal cues required       PT Short Term Goals - 04/02/18 1624      PT SHORT TERM GOAL #1   Title  Pt will perform HEP with supervision, for improved balance, strength and gait.  TARGET 04/30/2018    Baseline  no current  HEP    Time  4    Period  Weeks    Status  New    Target Date  04/30/18      PT SHORT TERM GOAL #2   Title  Pt will improve 5x sit<>stand to less than or equal to 13 seconds to demonstrate improved functional strength.    Baseline  5x sit<>stand 16 seconds    Time  4    Period  Weeks    Status  New    Target Date  04/30/18      PT SHORT TERM GOAL #3   Title  Pt will improve Berg Balance score to at least 43/56 for decreased fall risk.    Baseline  Berg 38/56  (Scores <45/56 indicate increased fall risk)    Time  4    Period  Weeks    Status  New    Target Date  04/30/18      PT SHORT TERM GOAL #4   Title  Pt will improve TUG score to less than or equal to 20 seconds for decreased fall risk.    Baseline  TUG >13.5 sec indicates increased fall risk.    Time  4    Period  Weeks    Status  New    Target Date  04/30/18      PT SHORT TERM GOAL #5   Title  Pt will ambulate at least 50-100 ft, with cane/no assistive device, supervision, for improved household mobility.    Baseline  150 ft RW supervision    Time  4    Period  Weeks    Status  New    Target Date  04/30/18      Additional Short Term Goals   Additional Short Term Goals  Yes      PT SHORT TERM GOAL #6   Title  Pt/family will verbalize understanding of fall prevention in home environment.    Baseline  Fall risk per TUG, gait velocity, Berg    Time  4    Period  Weeks    Status  New    Target Date  04/30/18         PT Long Term Goals - 04/02/18 1632      PT LONG TERM GOAL #1   Title  Pt will perform progressive HEP for improved strength, balance, gait.  TARGET 06/25/2018    Baseline  no current HEP    Time  12    Period  Weeks    Status  New    Target Date  06/25/18      PT LONG TERM GOAL #2   Title  Pt will improve 5x sit<>stand to less than or equal to 10 seconds for improved functional strength/transfer efficiency.    Baseline  5x:  16 sec at eval    Time  12    Period  Weeks    Status  New    Target Date  06/25/18      PT LONG TERM GOAL #3   Title  Pt will improve TUG score to less than or equal to 13.5 seconds for decreased fall risk.    Baseline  TUG 25.25 at eval    Time  12    Period  Weeks    Status  New    Target Date  06/25/18      PT LONG TERM GOAL #4   Title  Pt will improve Berg Balance score to at least  48/56 for decreased fall risk.    Baseline  Berg 38/56 at eval    Time  12    Period  Weeks    Status  New    Target Date  06/25/18      PT LONG TERM GOAL #5   Title  Pt will ambulate independently, 1000 ft, indoor and outdoor surfaces, no LOB for improved community gait.    Baseline  150 ft Supervision RW    Time  12    Period  Weeks    Status  New    Target Date  06/25/18            Plan - 05/03/18 1134    Clinical Impression Statement  Telehealth visit completed today, with pt's father close supervision and providing additional demonstration for patient.  Treatment time focused on review of HEP, which pt return demo understanding of HEP with father's supervision and visual cues.  Pt requires cues to slow exercises.  Also focused on additional stretching, core stability exercises.  At end of session, pt shows gait with supervision with father, using cane.  Pt appears steady, but does need cues for proper cane sequence, and father reports he will continue to work with her on this.  Pt responds well to cues through telehealth visit; she does have  difficulty with single limb stance without UE support and attempts at standing single limb plantarflexion.  She will continue to benefit from skilled PT to further address strength, balance, and gait to progress towards independence.    Personal Factors and Comorbidities  Comorbidity 3+    Comorbidities  Turner syndrome, scoliosis with Harrington rod placement, pneumococcal meningitis and profound hearing loss    Examination-Activity Limitations  Stand;Transfers;Locomotion Level    Examination-Participation Restrictions  Community Activity    Stability/Clinical Decision Making  Evolving/Moderate complexity    Rehab Potential  Good    PT Frequency  2x / week    PT Duration  12 weeks    PT Treatment/Interventions  ADLs/Self Care Home Management;DME Instruction;Gait training;Functional mobility training;Stair training;Therapeutic activities;Therapeutic exercise;Patient/family education;Neuromuscular re-education;Balance training    PT Next Visit Plan  Review additions to HEP (and discuss dividing up HEP on different days if needed); try step ups and work on gait/cane sequence (step through at counter?); fall prevention and begin looking at [place] and Agree with Plan of Care  Patient;Family member/caregiver    Family Member Consulted  Dad       Patient will benefit from skilled therapeutic intervention in order to improve the following deficits and impairments:  Abnormal gait, Decreased balance, Decreased mobility, Decreased strength, Difficulty walking, Postural dysfunction  Visit Diagnosis: Muscle weakness (generalized)  Unsteadiness on feet  Other abnormalities of gait and mobility     Problem List Patient Active Problem List   Diagnosis Date Noted  . Sinusitis 03/03/2018  . 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 Cerebrovascular accident (CVA) ([place])   . Benign essential HTN   . Tachypnea   . Sensorineural hearing loss (SNHL) of  both ears 01/31/2018  . Dyslipidemia 01/31/2018  . Acute ischemic stroke ([place]) 01/30/2018  . Pneumococcal bacteremia   . Pneumococcal meningitis   . Turner syndrome   . Hypothyroidism 01/15/2018  . Generalized anxiety disorder 01/15/2018  . Bicuspid aortic valve 04/06/2013  . HTN (hypertension) 04/06/2013    Deyonte Cadden W. 05/03/2018, 11:42 AM  Frazier Butt., PT  [facility]  [facility] [address] [place], [place], [address] Phone: [phone]   Fax:  [phone]  Name: Emma Martin MRN: [id number] Date of Birth: [birth year]

## 2018-05-06 ENCOUNTER — Ambulatory Visit: Payer: BLUE CROSS/BLUE SHIELD | Admitting: Physical Therapy

## 2018-05-06 ENCOUNTER — Encounter: Payer: BLUE CROSS/BLUE SHIELD | Admitting: Speech Pathology

## 2018-05-11 ENCOUNTER — Ambulatory Visit: Payer: BLUE CROSS/BLUE SHIELD | Admitting: Speech Pathology

## 2018-05-11 ENCOUNTER — Encounter: Payer: Self-pay | Admitting: Speech Pathology

## 2018-05-11 ENCOUNTER — Ambulatory Visit: Payer: BLUE CROSS/BLUE SHIELD | Admitting: Occupational Therapy

## 2018-05-11 ENCOUNTER — Ambulatory Visit: Payer: BLUE CROSS/BLUE SHIELD | Admitting: Physical Therapy

## 2018-05-11 DIAGNOSIS — R41841 Cognitive communication deficit: Secondary | ICD-10-CM | POA: Diagnosis not present

## 2018-05-11 DIAGNOSIS — M6281 Muscle weakness (generalized): Secondary | ICD-10-CM

## 2018-05-11 DIAGNOSIS — R2681 Unsteadiness on feet: Secondary | ICD-10-CM | POA: Diagnosis not present

## 2018-05-11 DIAGNOSIS — I69315 Cognitive social or emotional deficit following cerebral infarction: Secondary | ICD-10-CM | POA: Diagnosis not present

## 2018-05-11 DIAGNOSIS — R2689 Other abnormalities of gait and mobility: Secondary | ICD-10-CM

## 2018-05-11 NOTE — Therapy (Addendum)
[facility] [address] [place], [place], [address] Phone: [phone]   Fax:  [phone]  Speech Language Pathology Treatment  Patient Details  Name: Emma Martin MRN: [id number] Date of Birth: 1981-01-09 Referring Provider (SLP): Maurice Small, MD   Encounter Date: 05/11/2018  End of Session - 05/11/18 1507    Visit Number  2    Number of Visits  17    Date for SLP Re-Evaluation  06/25/18    SLP Start Time  1332    SLP Stop Time   1413    SLP Time Calculation (min)  41 min    Activity Tolerance  Patient tolerated treatment well       Past Medical History:  Diagnosis Date  . Bicuspid aortic valve    , mild aortic insufficiency. (No SBE prophylaxis needed)  . Complication of anesthesia   . Dyslipidemia   . Generalized anxiety disorder    , with obsessive-compulsive traits.  . Hypertension   . Hypothyroidism   . Mixed gonadal dysgenesis    , (additional Y-bearing cell line) for which she reportedly underwent bilateral gonadectomy (patient unaware; needs confirmation) due to malignancy potential.  . PONV (postoperative nausea and vomiting)   . Primary amenorrhea    , treated starting at age 26 or 72 with estrogen and progesterone  . Scoliosis    , treated with Harrington rods (1997) for stabilization.  . Seasonal allergic rhinitis   . Short stature    , previously treated with growth hormone age 37-15 years.  Radford Pax syndrome    , previously followed by pediatric endocrinologist Freddy Jaksch, MD at [facility] through her early 98s).    Past Surgical History:  Procedure Laterality Date  . TEE WITHOUT CARDIOVERSION N/A 01/19/2018   Procedure: TRANSESOPHAGEAL ECHOCARDIOGRAM (TEE);  Surgeon: Jerline Pain, MD;  Location: [facility] ENDOSCOPY;  Service: Cardiovascular;  Laterality: N/A;  . TEE WITHOUT CARDIOVERSION N/A 02/02/2018   Procedure: TRANSESOPHAGEAL ECHOCARDIOGRAM (TEE);  Surgeon: Elouise Munroe, MD;  Location: [facility];  Service: Cardiology;  Laterality: N/A;     Therapy Telehealth Visit:  I connected with Emma Martin today at 1330 by Webex video conference and verified that I am speaking with the correct person using two identifiers.  I discussed the limitations, risks, security and privacy concerns of performing an evaluation and management service by Webex and the availability of in person appointments.  I also discussed with the patient that there may be a patient responsible charge related to this service. The patient expressed understanding and agreed to proceed.    The patient's address was confirmed.  Identified to the patient that therapist is a licensed SLP in the state of [place].  Verified phone # as  to call in case of technical difficulties.     There were no vitals filed for this visit.  Subjective Assessment - 05/11/18 1443    Subjective  "I watch her take her flonase, sometime she forgets she took and immediately starts to take it again"    Patient is accompained by:  Family member   dad   Currently in Pain?  No/denies            ADULT SLP TREATMENT - 05/11/18 1444      General Information   Behavior/Cognition  Alert;Cooperative;Pleasant mood      Treatment Provided   Treatment provided  Cognitive-Linquistic      Cognitive-Linquistic Treatment   Treatment focused on  Cognition    Skilled Treatment  Teletherapy session. Targeted use of compensatory strategies for simple medication management, 1x a day to start. Generated strategies with pt and dad to utilize a medication organizer, daily calendar and consistent time for meds. Dad and pt report the she is watching TV most of the day. We collaborated and decided to generate a loose daily schedule to include PT, OT HEPS, outdoor time and cognitive activities with chart or cross off log. Pt is watching her videos for PT exercises, however her father has to continually cue her to complete the  exercise after each video. We decided to try using a check off strategy where Emma Martin will check off after she's complete each exercise to habiutalize doing the exercise after each video, rather than watching the video over and over and not actuall doing the exercise.       Progression Toward Goals   Progression toward goals  Progressing toward goals       SLP Education - 05/11/18 1504    Education Details  cognitive activities to do at home; compensations for med managemment and f/u with PT HEP    Person(s) Educated  Patient;Parent(s)    Methods  Explanation;Handout;Verbal cues    Comprehension  Verbalized understanding;Need further instruction;Verbal cues required       SLP Short Term Goals - 05/11/18 1506      SLP SHORT TERM GOAL #1   Title  pt will demo sustained attention to complete 4 7-minute cognitive linguistic tasks, for improved participation in therapy over 3 sessions    Time  4    Period  Weeks    Status  On-going   or 9 total visits, for all STGs     SLP SHORT TERM GOAL #2   Title  pt will demo incr'd awareness by telling SLP 3 things that are more difficult now than prior to CVA    Time  4    Period  Weeks    Status  On-going      SLP SHORT TERM GOAL #3   Title  pt will use a Child psychotherapist with occasional min A to do so    Time  4    Period  Weeks    Status  On-going       SLP Long Term Goals - 05/11/18 1506      SLP LONG TERM GOAL #1   Title  pt will demo sustained/selective attention to complete 3 12-minute cognitive linguistic tasks, for improved participation in therapy over 3 sessions    Time  8    Period  Weeks   or 17 sessions, for all LTGs   Status  On-going      SLP LONG TERM GOAL #2   Title  pt's father/family will report pt asking about schedule less than prior to ST    Time  8    Period  Weeks    Status  On-going      SLP LONG TERM GOAL #3   Title  pt will demo use of memory notebook/planner for homework, schedule  verification, etc in 8 therapy sessions    Time  8    Period  Weeks    Status  On-going       Plan - 05/11/18 1505    Clinical Impression Statement  Pt presents today with cognitive linguistic deficits 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 Long term memory suspected as  affected SLP due to father reporting pt asking where her mother is intermittently during the week (mother passed away 2 years ago). SLP to complete cognitive linguistic testing next session. Pt's father reports pt will ask what time they are leaving for appointments multiple times per day.Pt will benefit from skilled ST for improvement in cognitive communication skills in order to incr pt independnence.     Speech Therapy Frequency  2x / week   due to Covid, frerquency may be reduced   Duration  --   8 weeks or 17 visits   Treatment/Interventions  Cueing hierarchy;SLP instruction and feedback;Compensatory strategies;Functional tasks;Cognitive reorganization;Internal/external aids;Patient/family education    Potential to Achieve Goals  Good    Potential Considerations  Ability to learn/carryover information;Co-morbidities;Severity of impairments    Consulted and Agree with Plan of Care  Patient;Family member/caregiver    Family Member Consulted  father       Patient will benefit from skilled therapeutic intervention in order to improve the following deficits and impairments:   Cognitive communication deficit    Problem List Patient Active Problem List   Diagnosis Date Noted  . Sinusitis 03/03/2018  . 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 Cerebrovascular accident (CVA) ([place])   . Benign essential HTN   . Tachypnea   . Sensorineural hearing loss (SNHL) of both ears 01/31/2018  . Dyslipidemia 01/31/2018  . Acute ischemic stroke ([place]) 01/30/2018  . Pneumococcal bacteremia   . Pneumococcal meningitis   . Turner syndrome   .  Hypothyroidism 01/15/2018  . Generalized anxiety disorder 01/15/2018  . Bicuspid aortic valve 04/06/2013  . HTN (hypertension) 04/06/2013    Commodore Bellew, Annye Rusk MS, CCC-SLP 05/11/2018, 3:21 PM  [facility] [address] [place], [place], [address] Phone: [phone]   Fax:  [phone]   Name: JIL PENLAND MRN: [id number] Date of Birth: [birth year]

## 2018-05-11 NOTE — Patient Instructions (Signed)
   Cognitive Activities you can do at home:   - Solitaire  - Kiln (easy level)  - [place]  On your computer, tablet or phone: Engineer, drilling   When you come in next time, show me your exercise check offs and let me know how it is going with you using a med organizer and timer (if needed) to recall 1 med time each day  A daily schedule with time for exercises, brain activity, outdoor time, chores will be beneficial   Tips to help facilitate better attention, concentration, focus   Do harder, longer tasks when you are most alert/awake  Break down larger tasks into small parts  Limit distractions of TV, radio, conversation, e mails/texts, appliance noise, etc - if a job is important, do it in a quiet room  Be aware of how you are functioning in high stimulation environments such as large stores, parties, restaurants - any place with lots of lights, noise, signs etc  Group conversations may be more difficult to process than one on one conversations  Give yourself extra time to process conversation, reading materials, directions or information from your healthcare providers  Organization is key - clutters of laundry, mail, paperwork, dirty dishes - all make it more difficult to concentrate  Before you start a task, have all the needed supplies, directions, recipes ready and organized. This way you don't have to go looking for something in the middle of a task and become distracted.   Be aware of fatigue - take rests or breaks when needed to re-group and re-focus

## 2018-05-12 ENCOUNTER — Encounter: Payer: Self-pay | Admitting: Physical Therapy

## 2018-05-12 NOTE — Therapy (Signed)
[facility] [address] [place], [place], [address] Phone: [phone]   Fax:  [phone]  Physical Therapy Treatment  Patient Details  Name: Emma Martin MRN: [id number] Date of Birth: 1980-04-18 Referring Provider (PT): Maurice Small, MD  Physical Therapy Telehealth Visit:  I connected with Emma Martin today at 1200 by Webex video conference and verified that I am speaking with the correct person using two identifiers.  I discussed the limitations, risks, security and privacy concerns of performing an evaluation and management service by Webex and the availability of in person appointments.  I also discussed with the patient that there may be a patient responsible charge related to this service. The patient expressed understanding and agreed to proceed.    The patient's address was confirmed.  Identified to the patient that therapist is a licensed physical therapist in the state of [place].  Verified phone # as [phone] to call in case of technical difficulties.     Encounter Date: 05/11/2018  PT End of Session - 05/12/18 2131    Visit Number  5    Number of Visits  25    Date for PT Re-Evaluation  07/01/18    Authorization Type  BCBS, Medicaid (4th, 5th visits are telehealth)    Authorization - Visit Number  5    Authorization - Number of Visits  30    PT Start Time  1206    PT Stop Time  1257    PT Time Calculation (min)  51 min    Activity Tolerance  Patient tolerated treatment well    Behavior During Therapy  WFL for tasks assessed/performed       Past Medical History:  Diagnosis Date  . Bicuspid aortic valve    , mild aortic insufficiency. (No SBE prophylaxis needed)  . Complication of anesthesia   . Dyslipidemia   . Generalized anxiety disorder    , with obsessive-compulsive traits.  . Hypertension   . Hypothyroidism   . Mixed gonadal dysgenesis    , (additional Y-bearing cell line) for  which she reportedly underwent bilateral gonadectomy (patient unaware; needs confirmation) due to malignancy potential.  . PONV (postoperative nausea and vomiting)   . Primary amenorrhea    , treated starting at age 26 or 49 with estrogen and progesterone  . Scoliosis    , treated with Harrington rods (1997) for stabilization.  . Seasonal allergic rhinitis   . Short stature    , previously treated with growth hormone age 42-15 years.  Radford Pax syndrome    , previously followed by pediatric endocrinologist Freddy Jaksch, MD at [facility] through her early 87s).    Past Surgical History:  Procedure Laterality Date  . TEE WITHOUT CARDIOVERSION N/A 01/19/2018   Procedure: TRANSESOPHAGEAL ECHOCARDIOGRAM (TEE);  Surgeon: Jerline Pain, MD;  Location: [facility] ENDOSCOPY;  Service: Cardiovascular;  Laterality: N/A;  . TEE WITHOUT CARDIOVERSION N/A 02/02/2018   Procedure: TRANSESOPHAGEAL ECHOCARDIOGRAM (TEE);  Surgeon: Elouise Munroe, MD;  Location: [facility];  Service: Cardiology;  Laterality: N/A;    There were no vitals filed for this visit.  Subjective Assessment - 05/12/18 2119    Subjective  Dad reports Emma Martin has walked outside on the street for 1/4 of a mile with him.  No falls.  Been doing her exercises some.    Patient is accompained by:  Family member   Dad   Patient Stated Goals  Want to feel stronger moving around, to not  depend on walker.    Currently in Pain?  No/denies       Therapeutic Exercise Reviewed HEP updates given last visit:   Seated Hamstring Stretch - 3 reps - 1 sets - 30 hold -pt return demo with cues Standing Gastroc Stretch on Step - 3 reps - 1 sets - 30 sec hold-verbal review Standing Bilateral Gastroc Stretch with Step - 3 reps - 1 sets - 30 sec hold -verbal review Seated Transversus Abdominis Bracing - 10 reps - 1 sets - 5 sec hold  -pt return demo with cues Seated Marching with Opposite Shoulder Flexion - 10 reps - 1 sets -pt return demo with  cues  Progressed core stability and hip exercises with work on the following: Seated Long [employer] - 10 reps - 1 sets - with addition of opposite arm lift.  Pt needs extra time and cues from PT and father on technique. Seated Gluteal Sets - 10 reps - 1-2 sets - 3 sec hold - cues from PT and father on technique.  Neuro Re-education 5x sit<>stand:  16.15 seconds, no UE support (PT measures as pt performs in video) TUG (father measures 10 ft area, and PT measures as pt performs in video), no cane:  17.81 sec (improved from 25.25 seconds at eval with RW)  Pre-gait activities for balance at counter:   -Forward/back walking at counter, 2 reps-cues for equal step length -forwward/back step and weightshift x 5 reps each leg (extra cues and time to perform) Alternating Step Forward with Support - 10 reps - at counter with support  Using cane and counter for support:  Worked on step and sequence of cane-with visual cues from PT, visual and verbal/tactile cues from father.  Pt has difficulty with sequence practice with cane.  Needs cues for slightly decreased step length, as pt tends to take very long steps.  Discussed next visit perhaps using hallway to practice longer distance with/without cane.                      PT Education - 05/12/18 2130    Education Details  Additions/updates to HEP-see instructions    Person(s) Educated  Patient;Parent(s)    Methods  Explanation;Demonstration;Verbal cues;Handout   visual cues, handout through [facility] email   Comprehension  Verbalized understanding;Returned demonstration;Verbal cues required;Need further instruction       PT Short Term Goals - 05/12/18 2134      PT SHORT TERM GOAL #1   Title  Pt will perform HEP with supervision, for improved balance, strength and gait.  TARGET 04/30/2018    Baseline  needs father's cues    Time  4    Period  Weeks    Status  Partially Met    Target Date  04/30/18      PT SHORT TERM GOAL #2    Title  Pt will improve 5x sit<>stand to less than or equal to 13 seconds to demonstrate improved functional strength.    Baseline  5x sit<>stand 16 seconds; 16.15 sec via telehealth visit 05/11/2018    Time  4    Period  Weeks    Status  Not Met    Target Date  04/30/18      PT SHORT TERM GOAL #3   Title  Pt will improve Berg Balance score to at least 43/56 for decreased fall risk.    Baseline  Berg 38/56  (Scores <45/56 indicate increased fall risk)    Time  4    Period  Weeks    Status  On-going    Target Date  04/30/18      PT SHORT TERM GOAL #4   Title  Pt will improve TUG score to less than or equal to 20 seconds for decreased fall risk.    Baseline  TUG >13.5 sec indicates increased fall risk.; 17.81 sec 05/11/2018    Time  4    Period  Weeks    Status  Achieved    Target Date  04/30/18      PT SHORT TERM GOAL #5   Title  Pt will ambulate at least 50-100 ft, with cane/no assistive device, supervision, for improved household mobility.    Baseline  150 ft RW supervision    Time  4    Period  Weeks    Status  On-going    Target Date  04/30/18      PT SHORT TERM GOAL #6   Title  Pt/family will verbalize understanding of fall prevention in home environment.    Baseline  Fall risk per TUG, gait velocity, Berg    Time  4    Period  Weeks    Status  On-going    Target Date  04/30/18        PT Long Term Goals - 04/02/18 1632      PT LONG TERM GOAL #1   Title  Pt will perform progressive HEP for improved strength, balance, gait.  TARGET 06/25/2018    Baseline  no current HEP    Time  12    Period  Weeks    Status  New    Target Date  06/25/18      PT LONG TERM GOAL #2   Title  Pt will improve 5x sit<>stand to less than or equal to 10 seconds for improved functional strength/transfer efficiency.    Baseline  5x:  16 sec at eval    Time  12    Period  Weeks    Status  New    Target Date  06/25/18      PT LONG TERM GOAL #3   Title  Pt will improve TUG score to  less than or equal to 13.5 seconds for decreased fall risk.    Baseline  TUG 25.25 at eval    Time  12    Period  Weeks    Status  New    Target Date  06/25/18      PT LONG TERM GOAL #4   Title  Pt will improve Berg Balance score to at least 48/56 for decreased fall risk.    Baseline  Berg 38/56 at eval    Time  12    Period  Weeks    Status  New    Target Date  06/25/18      PT LONG TERM GOAL #5   Title  Pt will ambulate independently, 1000 ft, indoor and outdoor surfaces, no LOB for improved community gait.    Baseline  150 ft Supervision RW    Time  12    Period  Weeks    Status  New    Target Date  06/25/18            Plan - 05/12/18 2132    Clinical Impression Statement  Telehealth PT visit today, with father's close supervision and providing additional cues and demonstration for patient.  Pt seems to need extra time today to review seated stretches  and core stability exercises.  Needs extra time, demo, cues for standing exercises to work on cane sequencing and step length at counter.  Pt will continue to benefit from skilled PT to address strength, balance,a nd gait for overall improved functional mobility and independence.  Did address some STGs today-STG 1 partially met as pt needs supervisiona nd cues for HEP from father.  STG 2 not met for 5x sit<>stand, STG 3, 5, 6 not assessed due to constraints of telehealth visit.  STG 4 met for improved TUG score with no device.    Personal Factors and Comorbidities  Comorbidity 3+    Comorbidities  Turner syndrome, scoliosis with Harrington rod placement, pneumococcal meningitis and profound hearing loss    Examination-Activity Limitations  Stand;Transfers;Locomotion Level    Examination-Participation Restrictions  Community Activity    Stability/Clinical Decision Making  Evolving/Moderate complexity    Rehab Potential  Good    PT Frequency  2x / week    PT Duration  12 weeks    PT Treatment/Interventions  ADLs/Self Care Home  Management;DME Instruction;Gait training;Functional mobility training;Stair training;Therapeutic activities;Therapeutic exercise;Patient/family education;Neuromuscular re-education;Balance training    PT Next Visit Plan  Review additions to HEP (and discuss dividing up HEP on different days if needed); continue to work on gait/cane sequence (step through at counter or in hallway at home);    PT [place] and Agree with Plan of Care  Patient;Family member/caregiver    Family Member Consulted  Dad       Patient will benefit from skilled therapeutic intervention in order to improve the following deficits and impairments:  Abnormal gait, Decreased balance, Decreased mobility, Decreased strength, Difficulty walking, Postural dysfunction  Visit Diagnosis: Muscle weakness (generalized)  Unsteadiness on feet  Other abnormalities of gait and mobility     Problem List Patient Active Problem List   Diagnosis Date Noted  . Sinusitis 03/03/2018  . 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 Cerebrovascular accident (CVA) ([place])   . Benign essential HTN   . Tachypnea   . Sensorineural hearing loss (SNHL) of both ears 01/31/2018  . Dyslipidemia 01/31/2018  . Acute ischemic stroke ([place]) 01/30/2018  . Pneumococcal bacteremia   . Pneumococcal meningitis   . Turner syndrome   . Hypothyroidism 01/15/2018  . Generalized anxiety disorder 01/15/2018  . Bicuspid aortic valve 04/06/2013  . HTN (hypertension) 04/06/2013    Berneda Piccininni W. 05/12/2018, 9:39 PM  Frazier Butt., PT  [facility] [address] [place], [place], [address] Phone: [phone]   Fax:  [phone]  Name: Emma Martin MRN: [id number] Date of Birth: [birth year]

## 2018-05-12 NOTE — Patient Instructions (Signed)
Access Code: [email or web address]  URL: [email or web address]  Date: 05/12/2018  Prepared by: Mady Haagensen   Exercises  Standing Marching - 10 reps - 2-3 sets - 1x daily - 5x weekly  Standing Hip Abduction with Counter Support - 10 reps - 2-3 sets - 1x daily - 5x weekly  Standing Hip Extension with Counter Support - 10 reps - 2-3 sets - 1x daily - 5x weekly  Standing Tandem Balance with Counter Support - 3 reps - 1 sets - 10 sec hold - 1x daily - 5x weekly  Single Leg Stance with Support - 3 reps - 1 sets - 10 sec hold - 1x daily - 5x weekly  Heel Toe Raises with Counter Support - 10 reps - 2-3 sets - 3 esc hold - 1x daily - 5x weekly  Heel Toe Raises with Unilateral Counter Support - 10 reps - 2-3 sets - 3 sec hold - 1x daily - 5x weekly  Mini Squat with Counter Support - 10 reps - 2-3 sets - 1x daily - 5x weekly  Sit to Stand - 10 reps - 2-3 sets - 1x daily - 5x weekly  Seated Hamstring Stretch - 3 reps - 1 sets - 30 hold - 2x daily - 6x weekly  Standing Gastroc Stretch on Step - 3 reps - 1 sets - 30 sec hold - 2x daily - 6x weekly  Standing Bilateral Gastroc Stretch with Step - 3 reps - 1 sets - 30 sec hold - 1x daily - 6x weekly  Seated Transversus Abdominis Bracing - 10 reps - 1 sets - 5 sec hold - 2x daily - 6x weekly  Seated Marching with Opposite Shoulder Flexion - 10 reps - 1 sets - 3 sec hold - 1x daily - 6x weekly   Added 05/12/2018 Seated Long [employer] - 10 reps - 1 sets - 1x daily - 5x weekly  Seated Gluteal Sets - 10 reps - 1-2 sets - 3 sec hold - 1x daily - 5x weekly  Alternating Step Forward with Support - 10 reps - 1-2 sets - 1x daily - 5x weekly

## 2018-05-13 ENCOUNTER — Ambulatory Visit: Payer: BLUE CROSS/BLUE SHIELD | Admitting: Physical Therapy

## 2018-05-13 ENCOUNTER — Ambulatory Visit: Payer: BLUE CROSS/BLUE SHIELD

## 2018-05-13 DIAGNOSIS — R7881 Bacteremia: Secondary | ICD-10-CM | POA: Diagnosis not present

## 2018-05-13 DIAGNOSIS — G001 Pneumococcal meningitis: Secondary | ICD-10-CM | POA: Diagnosis not present

## 2018-05-13 DIAGNOSIS — I639 Cerebral infarction, unspecified: Secondary | ICD-10-CM | POA: Diagnosis not present

## 2018-05-13 DIAGNOSIS — G039 Meningitis, unspecified: Secondary | ICD-10-CM | POA: Diagnosis not present

## 2018-05-18 ENCOUNTER — Encounter: Payer: BLUE CROSS/BLUE SHIELD | Admitting: Occupational Therapy

## 2018-05-18 ENCOUNTER — Encounter: Payer: BLUE CROSS/BLUE SHIELD | Admitting: Speech Pathology

## 2018-05-18 ENCOUNTER — Ambulatory Visit: Payer: BLUE CROSS/BLUE SHIELD | Admitting: Speech Pathology

## 2018-05-18 ENCOUNTER — Encounter: Payer: Self-pay | Admitting: Speech Pathology

## 2018-05-18 ENCOUNTER — Ambulatory Visit: Payer: BLUE CROSS/BLUE SHIELD | Admitting: Physical Therapy

## 2018-05-18 ENCOUNTER — Encounter: Payer: Self-pay | Admitting: Physical Therapy

## 2018-05-18 ENCOUNTER — Other Ambulatory Visit: Payer: Self-pay

## 2018-05-18 DIAGNOSIS — I69315 Cognitive social or emotional deficit following cerebral infarction: Secondary | ICD-10-CM

## 2018-05-18 DIAGNOSIS — R2681 Unsteadiness on feet: Secondary | ICD-10-CM

## 2018-05-18 DIAGNOSIS — R2689 Other abnormalities of gait and mobility: Secondary | ICD-10-CM

## 2018-05-18 DIAGNOSIS — R41841 Cognitive communication deficit: Secondary | ICD-10-CM | POA: Diagnosis not present

## 2018-05-18 DIAGNOSIS — M6281 Muscle weakness (generalized): Secondary | ICD-10-CM | POA: Diagnosis not present

## 2018-05-18 NOTE — Patient Instructions (Addendum)
  Set up a rough daily schedule - Modify the one we made up as needed  Get a 3 ring binder - put your exercises and logs in it, as well as your calendar and [facility]  Bring everything with you to therapy each time  For [facility] - Take your menu plan and list - google Kristopher Oppenheim prices, add them up and get total cost  Set you phone vibrating alarm to help you remember daily meds

## 2018-05-18 NOTE — Therapy (Signed)
[facility] [address] [place], [place], [address] Phone: [phone]   Fax:  [phone]  Speech Language Pathology Treatment  Patient Details  Name: Emma Martin MRN: [id number] Date of Birth: [birth year] Referring Provider (SLP): Maurice Small, MD   Encounter Date: 05/18/2018  End of Session - 05/18/18 1513    Visit Number  3    Number of Visits  17    Date for SLP Re-Evaluation  06/25/18    SLP Start Time  32    SLP Stop Time   1450    SLP Time Calculation (min)  50 min    Activity Tolerance  Patient tolerated treatment well       Past Medical History:  Diagnosis Date  . Bicuspid aortic valve    , mild aortic insufficiency. (No SBE prophylaxis needed)  . Complication of anesthesia   . Dyslipidemia   . Generalized anxiety disorder    , with obsessive-compulsive traits.  . Hypertension   . Hypothyroidism   . Mixed gonadal dysgenesis    , (additional Y-bearing cell line) for which she reportedly underwent bilateral gonadectomy (patient unaware; needs confirmation) due to malignancy potential.  . PONV (postoperative nausea and vomiting)   . Primary amenorrhea    , treated starting at age 64 or 24 with estrogen and progesterone  . Scoliosis    , treated with Harrington rods (1997) for stabilization.  . Seasonal allergic rhinitis   . Short stature    , previously treated with growth hormone age 54-16 years.  Radford Pax syndrome    , previously followed by pediatric endocrinologist Freddy Jaksch, [facility] at [facility] through her early 65s).    Past Surgical History:  Procedure Laterality Date  . TEE WITHOUT CARDIOVERSION N/A 01/19/2018   Procedure: TRANSESOPHAGEAL ECHOCARDIOGRAM (TEE);  Surgeon: Jerline Pain, MD;  Location: [facility] ENDOSCOPY;  Service: Cardiovascular;  Laterality: N/A;  . TEE WITHOUT CARDIOVERSION N/A 02/02/2018   Procedure: TRANSESOPHAGEAL ECHOCARDIOGRAM (TEE);  Surgeon: Elouise Munroe, MD;  Location: [facility];  Service: Cardiology;  Laterality: N/A;    There were no vitals filed for this visit.  Subjective Assessment - 05/18/18 1457    Subjective  Pt arrives without HW     Currently in Pain?  No/denies            ADULT SLP TREATMENT - 05/18/18 1504      General Information   Behavior/Cognition  Alert;Cooperative;Pleasant mood      Treatment Provided   Treatment provided  Cognitive-Linquistic      Cognitive-Linquistic Treatment   Treatment focused on  Cognition    Skilled Treatment  Pt arrives without [facility] - She has not initiated a daily schedule or chore list, nor attemtpted med management with her dad's supervision. To facilitate carryover of HEP for PT/OT, we generated daily log for check off of her HEPs. Pt did not bring her phone and arrives alone. We generated strategy to set her phone alarm vibration for am meds at 7:30 and we generated daily schedule which includes time for PT/OT and some basic chores. She will review this with her father. Organization and attention targeted generating 5 day dinner menu, as well as grocery list with extended time and min A. Pt is to look up prices on her grocery list and return with the list and total cost.       Assessment / Recommendations / Plan   Plan  Continue with current plan of  care      Progression Toward Goals   Progression toward goals  Progressing toward goals       SLP Education - 05/18/18 1508    Education Details  compensations for med management and management of daily schedule    Person(s) Educated  Patient    Methods  Explanation;Demonstration;Verbal cues;Handout    Comprehension  Verbalized understanding;Verbal cues required;Need further instruction       SLP Short Term Goals - 05/18/18 1512      SLP SHORT TERM GOAL #1   Title  pt will demo sustained attention to complete 4 7-minute cognitive linguistic tasks, for improved participation in therapy over 3 sessions    Baseline   05/18/18;     Time  3    Period  Weeks    Status  On-going   or 9 total visits, for all STGs     SLP SHORT TERM GOAL #2   Title  pt will demo incr'd awareness by telling SLP 3 things that are more difficult now than prior to CVA    Time  3    Period  Weeks    Status  On-going      SLP SHORT TERM GOAL #3   Title  pt will use a Child psychotherapist with occasional min A to do so    Time  3    Period  Weeks    Status  On-going       SLP Long Term Goals - 05/18/18 1512      SLP LONG TERM GOAL #1   Title  pt will demo sustained/selective attention to complete 3 12-minute cognitive linguistic tasks, for improved participation in therapy over 3 sessions    Time  7    Period  Weeks   or 17 sessions, for all LTGs   Status  On-going      SLP LONG TERM GOAL #2   Title  pt's father/family will report pt asking about schedule less than prior to ST    Time  7    Period  Weeks    Status  On-going      SLP LONG TERM GOAL #3   Title  pt will demo use of memory notebook/planner for homework, schedule verification, etc in 8 therapy sessions    Time  7    Period  Weeks    Status  On-going       Plan - 05/18/18 1509    Clinical Impression Statement  Pt continues to present with moderate impairment in attention, memory, and awareness. Compensations for carryover of PT/OT HEP as well as maintaining a daily chore/activity list initiated. Continue skilled ST to maximize cognition and carryover of compensations for cognition for safety and independence    Speech Therapy Frequency  2x / week    Duration  --   8 weeks or 17 visits   Treatment/Interventions  Cueing hierarchy;SLP instruction and feedback;Compensatory strategies;Functional tasks;Cognitive reorganization;Internal/external aids;Patient/family education       Patient will benefit from skilled therapeutic intervention in order to improve the following deficits and impairments:   Cognitive social or emotional deficit  following cerebral infarction    Problem List Patient Active Problem List   Diagnosis Date Noted  . Sinusitis 03/03/2018  . 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 Cerebrovascular accident (CVA) ([place])   . Benign essential HTN   . Tachypnea   . Sensorineural hearing loss (SNHL) of both ears 01/31/2018  . Dyslipidemia 01/31/2018  . Acute  ischemic stroke ([place]) 01/30/2018  . Pneumococcal bacteremia   . Pneumococcal meningitis   . Turner syndrome   . Hypothyroidism 01/15/2018  . Generalized anxiety disorder 01/15/2018  . Bicuspid aortic valve 04/06/2013  . HTN (hypertension) 04/06/2013    Trenell Concannon, Annye Rusk MS, CCC-SLP 05/18/2018, 3:14 PM  [facility] [address] [place], [place], [address] Phone: [phone]   Fax:  [phone]   Name: Emma Martin MRN: [id number] Date of Birth: Dec 01, 1980

## 2018-05-18 NOTE — Therapy (Signed)
[facility] [address] [place], [place], [address] Phone: [phone]   Fax:  [phone]  Physical Therapy Treatment  Patient Details  Name: Emma Martin MRN: [id number] Date of Birth: Nov 17, 1980 Referring Provider (PT): Maurice Small, MD   Encounter Date: 05/18/2018  CLINIC OPERATION CHANGES: [facility] is operating at a low capacity due to COVID-19.  The patient was brought into the clinic for evaluation and/or treatment following universal masking by staff, social distancing, and <10 people in the clinic.  The patient's COVID risk of complications score is 2.   PT End of Session - 05/18/18 2050    Visit Number  6    Number of Visits  25    Date for PT Re-Evaluation  07/01/18    Authorization Type  BCBS, Medicaid (4th, 5th visits are telehealth)    Authorization - Visit Number  6    Authorization - Number of Visits  30    PT Start Time  6270    PT Stop Time  1400    PT Time Calculation (min)  41 min    Activity Tolerance  Patient tolerated treatment well    Behavior During Therapy  WFL for tasks assessed/performed       Past Medical History:  Diagnosis Date  . Bicuspid aortic valve    , mild aortic insufficiency. (No SBE prophylaxis needed)  . Complication of anesthesia   . Dyslipidemia   . Generalized anxiety disorder    , with obsessive-compulsive traits.  . Hypertension   . Hypothyroidism   . Mixed gonadal dysgenesis    , (additional Y-bearing cell line) for which she reportedly underwent bilateral gonadectomy (patient unaware; needs confirmation) due to malignancy potential.  . PONV (postoperative nausea and vomiting)   . Primary amenorrhea    , treated starting at age 13 or 2 with estrogen and progesterone  . Scoliosis    , treated with Harrington rods (1997) for stabilization.  . Seasonal allergic rhinitis   . Short stature    , previously treated with growth  hormone age 90-15 years.  Radford Pax syndrome    , previously followed by pediatric endocrinologist Freddy Jaksch, MD at [facility] through her early 10s).    Past Surgical History:  Procedure Laterality Date  . TEE WITHOUT CARDIOVERSION N/A 01/19/2018   Procedure: TRANSESOPHAGEAL ECHOCARDIOGRAM (TEE);  Surgeon: Jerline Pain, MD;  Location: [facility] ENDOSCOPY;  Service: Cardiovascular;  Laterality: N/A;  . TEE WITHOUT CARDIOVERSION N/A 02/02/2018   Procedure: TRANSESOPHAGEAL ECHOCARDIOGRAM (TEE);  Surgeon: Elouise Munroe, MD;  Location: [facility];  Service: Cardiology;  Laterality: N/A;    There were no vitals filed for this visit.  Subjective Assessment - 05/18/18 2100    Subjective  No changes.  Doing well.    Patient Stated Goals  Want to feel stronger moving around, to not depend on walker.    Currently in Pain?  Yes    Pain Score  2     Pain Location  Leg   posterior thigh   Pain Orientation  Posterior;Right    Pain Descriptors / Indicators  Aching    Pain Type  Acute pain    Pain Onset  Today    Pain Frequency  Intermittent    Aggravating Factors   unsure    Pain Relieving Factors  stretching          Subjective Assessment - 05/18/18 2100    Subjective  No  changes.  Doing well.    Patient Stated Goals  Want to feel stronger moving around, to not depend on walker.    Currently in Pain?  Yes    Pain Score  2     Pain Location  Leg   posterior thigh   Pain Orientation  Posterior;Right    Pain Descriptors / Indicators  Aching    Pain Type  Acute pain    Pain Onset  Today    Pain Frequency  Intermittent    Aggravating Factors   unsure    Pain Relieving Factors  stretching                     [facility] Adult PT Treatment/Exercise - 05/18/18 0001      Ambulation/Gait   Ambulation/Gait  Yes    Ambulation/Gait Assistance  5: Supervision;4: Min guard    Ambulation/Gait Assistance Details  Gait training with cane (tripod base) and without cane.  PT  adjusted height to lower cane to appropriate height.  When using cane, PT provides tactile, verbal, and visual cues for correct sequence and to avoid taking too long of a step or reaching cane too far out.  When not using cane, pt has episodes of lateral hip sway and near LOB, especially to L.    Ambulation Distance (Feet)  230 Feet   x 4 reps (x 2 with cane, x 2 with no cane)   Assistive device  Straight cane;None   tripod base   Gait Pattern  Step-through pattern;Decreased stride length;Wide base of support;Decreased arm swing - right;Decreased arm swing - left;Decreased step length - left;Decreased step length - right    Ambulation Surface  Level;Indoor    Pre-Gait Activities  Cues provided throughout gait training for equal, even step length with gait.    Gait Comments  Trialed use of Boomwhackers with PT behind patient, to encourage reciprocal arm swing and trunk rotation, followed by no boomwhackers, and PT facilitating at shoulders for gentle arm swing and trunk rotation.      Standardized Balance Assessment   Standardized Balance Assessment  Berg Balance Test      Berg Balance Test   Sit to Stand  Able to stand without using hands and stabilize independently    Standing Unsupported  Able to stand safely 2 minutes    Sitting with Back Unsupported but Feet Supported on Floor or Stool  Able to sit safely and securely 2 minutes    Stand to Sit  Sits safely with minimal use of hands    Transfers  Able to transfer safely, minor use of hands    Standing Unsupported with Eyes Closed  Able to stand 10 seconds safely    Standing Ubsupported with Feet Together  Able to place feet together independently and stand 1 minute safely    From Standing, Reach Forward with Outstretched Arm  Reaches forward but needs supervision    From Standing Position, Pick up Object from Floor  Able to pick up shoe, needs supervision    From Standing Position, Turn to Look Behind Over each Shoulder  Looks behind from  both sides and weight shifts well    Turn 360 Degrees  Able to turn 360 degrees safely one side only in 4 seconds or less    Standing Unsupported, Alternately Place Feet on Step/Stool  Able to complete >2 steps/needs minimal assist    Standing Unsupported, One Foot in Front  Able to take small step independently  and hold 30 seconds    Standing on One Leg  Tries to lift leg/unable to hold 3 seconds but remains standing independently    Total Score  43      High Level Balance   High Level Balance Activities  Backward walking   Fwd/back walking at counter, cues: equal step length   High Level Balance Comments  At counter:  alternating forward step and weightshift, 2 reps x 10 reps             PT Education - 05/18/18 2050    Education Details  Gait training technique; Merrilee Jansky score update    Person(s) Educated  Patient    Methods  Explanation;Demonstration;Tactile cues;Verbal cues    Comprehension  Verbalized understanding;Returned demonstration;Verbal cues required;Tactile cues required       PT Short Term Goals - 05/12/18 2134      PT SHORT TERM GOAL #1   Title  Pt will perform HEP with supervision, for improved balance, strength and gait.  TARGET 04/30/2018    Baseline  needs father's cues    Time  4    Period  Weeks    Status  Partially Met    Target Date  04/30/18      PT SHORT TERM GOAL #2   Title  Pt will improve 5x sit<>stand to less than or equal to 13 seconds to demonstrate improved functional strength.    Baseline  5x sit<>stand 16 seconds; 16.15 sec via telehealth visit 05/11/2018    Time  4    Period  Weeks    Status  Not Met    Target Date  04/30/18      PT SHORT TERM GOAL #3   Title  Pt will improve Berg Balance score to at least 43/56 for decreased fall risk.    Baseline  Berg 38/56  (Scores <45/56 indicate increased fall risk)    Time  4    Period  Weeks    Status  On-going    Target Date  04/30/18      PT SHORT TERM GOAL #4   Title  Pt will improve TUG  score to less than or equal to 20 seconds for decreased fall risk.    Baseline  TUG >13.5 sec indicates increased fall risk.; 17.81 sec 05/11/2018    Time  4    Period  Weeks    Status  Achieved    Target Date  04/30/18      PT SHORT TERM GOAL #5   Title  Pt will ambulate at least 50-100 ft, with cane/no assistive device, supervision, for improved household mobility.    Baseline  150 ft RW supervision    Time  4    Period  Weeks    Status  On-going    Target Date  04/30/18      PT SHORT TERM GOAL #6   Title  Pt/family will verbalize understanding of fall prevention in home environment.    Baseline  Fall risk per TUG, gait velocity, Berg    Time  4    Period  Weeks    Status  On-going    Target Date  04/30/18        PT Long Term Goals - 04/02/18 1632      PT LONG TERM GOAL #1   Title  Pt will perform progressive HEP for improved strength, balance, gait.  TARGET 06/25/2018    Baseline  no current HEP    Time  12    Period  Weeks    Status  New    Target Date  06/25/18      PT LONG TERM GOAL #2   Title  Pt will improve 5x sit<>stand to less than or equal to 10 seconds for improved functional strength/transfer efficiency.    Baseline  5x:  16 sec at eval    Time  12    Period  Weeks    Status  New    Target Date  06/25/18      PT LONG TERM GOAL #3   Title  Pt will improve TUG score to less than or equal to 13.5 seconds for decreased fall risk.    Baseline  TUG 25.25 at eval    Time  12    Period  Weeks    Status  New    Target Date  06/25/18      PT LONG TERM GOAL #4   Title  Pt will improve Berg Balance score to at least 48/56 for decreased fall risk.    Baseline  Berg 38/56 at eval    Time  12    Period  Weeks    Status  New    Target Date  06/25/18      PT LONG TERM GOAL #5   Title  Pt will ambulate independently, 1000 ft, indoor and outdoor surfaces, no LOB for improved community gait.    Baseline  150 ft Supervision RW    Time  12    Period  Weeks     Status  New    Target Date  06/25/18            Plan - 05/18/18 2051    Clinical Impression Statement  In-clinic visit performed today for skilled PT, with focus on assessment of Berg as well as gait training.  Pt has improved Berg score from 38/56 to 43/56; pt does remain at fall risk but is improving balance.  With gait training, pt tends to take too long of a step on LLE or reach cane too far out with RLE.  Pre-gait and gait training activities focused on equal, even step length and incorporating more relaxed arm swing/cane use.  Pt is able to perform well on straight area of gym track, but with curves and with distractions, she tends to get off sequence.  With attempts at gait training without cane, pt is able to achieve improved rhythm, but does have widened BOS and lateral instability at times.  Discussed with patient and dad, in agreement with PT to continue upcoming PT sessions in clinic in order to better achieve pregait and gait training activities.    Personal Factors and Comorbidities  Comorbidity 3+    Comorbidities  Turner syndrome, scoliosis with Harrington rod placement, pneumococcal meningitis and profound hearing loss    Examination-Activity Limitations  Stand;Transfers;Locomotion Level    Examination-Participation Restrictions  Community Activity    Stability/Clinical Decision Making  Evolving/Moderate complexity    Rehab Potential  Good    PT Frequency  2x / week    PT Duration  12 weeks    PT Treatment/Interventions  ADLs/Self Care Home Management;DME Instruction;Gait training;Functional mobility training;Stair training;Therapeutic activities;Therapeutic exercise;Patient/family education;Neuromuscular re-education;Balance training    PT Next Visit Plan  Need to discuss dividing up HEP on different days-SLP has given initial chart; continue to work on gait/cane sequence; hip stability exercises and gait training without cane; dynamic balance and gait    PT Home  Exercise Plan   [id number]    Consulted and Agree with Plan of Care  Patient;Family member/caregiver    Family Member Consulted  Dad at end of session       Patient will benefit from skilled therapeutic intervention in order to improve the following deficits and impairments:  Abnormal gait, Decreased balance, Decreased mobility, Decreased strength, Difficulty walking, Postural dysfunction  Visit Diagnosis: Unsteadiness on feet  Other abnormalities of gait and mobility     Problem List Patient Active Problem List   Diagnosis Date Noted  . Sinusitis 03/03/2018  . 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 Cerebrovascular accident (CVA) ([place])   . Benign essential HTN   . Tachypnea   . Sensorineural hearing loss (SNHL) of both ears 01/31/2018  . Dyslipidemia 01/31/2018  . Acute ischemic stroke ([place]) 01/30/2018  . Pneumococcal bacteremia   . Pneumococcal meningitis   . Turner syndrome   . Hypothyroidism 01/15/2018  . Generalized anxiety disorder 01/15/2018  . Bicuspid aortic valve 04/06/2013  . HTN (hypertension) 04/06/2013    Nhia Heaphy W. 05/18/2018, 9:02 PM  Frazier Butt., PT   [facility] [address] [place], [place], [address] Phone: [phone]   Fax:  [phone]  Name: Emma Martin MRN: [id number] Date of Birth: [birth year]

## 2018-05-18 NOTE — Patient Instructions (Signed)
  With walking, make sure to take EQUAL, EVEN steps  Use the cane at the same time the left leg takes a step  Don't reach the cane too far out.

## 2018-05-20 ENCOUNTER — Encounter: Payer: BLUE CROSS/BLUE SHIELD | Admitting: Occupational Therapy

## 2018-05-20 ENCOUNTER — Ambulatory Visit: Payer: BLUE CROSS/BLUE SHIELD | Admitting: Physical Therapy

## 2018-05-25 ENCOUNTER — Other Ambulatory Visit: Payer: Self-pay

## 2018-05-25 ENCOUNTER — Ambulatory Visit: Payer: BLUE CROSS/BLUE SHIELD | Admitting: Physical Therapy

## 2018-05-25 ENCOUNTER — Ambulatory Visit: Payer: BC Managed Care – PPO | Attending: Family Medicine | Admitting: Speech Pathology

## 2018-05-25 ENCOUNTER — Encounter: Payer: Self-pay | Admitting: Occupational Therapy

## 2018-05-25 ENCOUNTER — Encounter: Payer: BLUE CROSS/BLUE SHIELD | Admitting: Occupational Therapy

## 2018-05-25 ENCOUNTER — Ambulatory Visit: Payer: BC Managed Care – PPO | Admitting: Physical Therapy

## 2018-05-25 ENCOUNTER — Ambulatory Visit: Payer: BC Managed Care – PPO | Admitting: Occupational Therapy

## 2018-05-25 DIAGNOSIS — R2681 Unsteadiness on feet: Secondary | ICD-10-CM | POA: Insufficient documentation

## 2018-05-25 DIAGNOSIS — R41842 Visuospatial deficit: Secondary | ICD-10-CM | POA: Diagnosis not present

## 2018-05-25 DIAGNOSIS — I69315 Cognitive social or emotional deficit following cerebral infarction: Secondary | ICD-10-CM | POA: Insufficient documentation

## 2018-05-25 DIAGNOSIS — R278 Other lack of coordination: Secondary | ICD-10-CM

## 2018-05-25 DIAGNOSIS — R41840 Attention and concentration deficit: Secondary | ICD-10-CM | POA: Insufficient documentation

## 2018-05-25 DIAGNOSIS — R41841 Cognitive communication deficit: Secondary | ICD-10-CM | POA: Diagnosis not present

## 2018-05-25 DIAGNOSIS — M6281 Muscle weakness (generalized): Secondary | ICD-10-CM

## 2018-05-25 DIAGNOSIS — R2689 Other abnormalities of gait and mobility: Secondary | ICD-10-CM | POA: Insufficient documentation

## 2018-05-25 DIAGNOSIS — R41844 Frontal lobe and executive function deficit: Secondary | ICD-10-CM | POA: Diagnosis not present

## 2018-05-25 NOTE — Therapy (Signed)
[facility] [address] [place], [place], [address] Phone: [phone]   Fax:  [phone]  Occupational Therapy Treatment  Patient Details  Name: Emma Martin MRN: [id number] Date of Birth: 12/28/1980 Referring Provider (OT): Maurice Small, MD   Encounter Date: 05/25/2018   CLINIC OPERATION CHANGES: [facility] is operating at a low capacity due to COVID-19.  The patient was brought into the clinic for evaluation and/or treatment following universal masking by staff/patients, social distancing, and <10 people in the clinic.  The patient's COVID risk of complications score is 2.   OT End of Session - 05/25/18 1504    Visit Number  4    Number of Visits  25    Date for OT Re-Evaluation  07/01/18    Authorization Type  BCBS / Medicaid    Authorization Time Period  BCBS--60 VL combined PT/OT counts as 2 visits if seen on same day.  Medicaid 3 visits approved 05/25/18-06/14/18    Authorization - Visit Number  1    Authorization - Number of Visits  3   Medicaid   OT Start Time  2774    OT Stop Time  1532    OT Time Calculation (min)  41 min    Activity Tolerance  Patient tolerated treatment well    Behavior During Therapy  WFL for tasks assessed/performed       Past Medical History:  Diagnosis Date  . Bicuspid aortic valve    , mild aortic insufficiency. (No SBE prophylaxis needed)  . Complication of anesthesia   . Dyslipidemia   . Generalized anxiety disorder    , with obsessive-compulsive traits.  . Hypertension   . Hypothyroidism   . Mixed gonadal dysgenesis    , (additional Y-bearing cell line) for which she reportedly underwent bilateral gonadectomy (patient unaware; needs confirmation) due to malignancy potential.  . PONV (postoperative nausea and vomiting)   . Primary amenorrhea    , treated starting at age 47 or 62 with estrogen and progesterone  . Scoliosis    , treated with  Harrington rods (1997) for stabilization.  . Seasonal allergic rhinitis   . Short stature    , previously treated with growth hormone age 20-15 years.  Radford Pax syndrome    , previously followed by pediatric endocrinologist Freddy Jaksch, MD at [facility] through her early 40s).    Past Surgical History:  Procedure Laterality Date  . TEE WITHOUT CARDIOVERSION N/A 01/19/2018   Procedure: TRANSESOPHAGEAL ECHOCARDIOGRAM (TEE);  Surgeon: Jerline Pain, MD;  Location: [facility] ENDOSCOPY;  Service: Cardiovascular;  Laterality: N/A;  . TEE WITHOUT CARDIOVERSION N/A 02/02/2018   Procedure: TRANSESOPHAGEAL ECHOCARDIOGRAM (TEE);  Surgeon: Elouise Munroe, MD;  Location: [facility];  Service: Cardiology;  Laterality: N/A;    There were no vitals filed for this visit.  Subjective Assessment - 05/25/18 1504    Subjective   Pt denies pain (OT used whiteboard/typed questions/cues/directions during session for communication)    Pertinent History  meningitis, CVA 01/30/2018    Currently in Pain?  No/denies        Tabletop visual scanning/number cancellation for attention/further assess visual scanning with 98% accuracy, skipped 1 row.  Reviewed current OT HEP (cane, putty, coordination) and pt returned demo with min-mod cueing (particularly for R shoulder hike with cane ex.)  Pt folded clothes with good accuracy/attention to task.  Began assessing progress towards STGs--see below.     OT Education - 05/25/18  [place]    Education Details  reviewed OT HEP (ST created chart for all pt's exercises) and instructed pt to perform simple functional activites at home--see pt instructions    Person(s) Educated  Patient    Methods  Explanation;Demonstration;Handout;Verbal cues    Comprehension  Verbalized understanding;Returned demonstration;Verbal cues required       OT Short Term Goals - 05/25/18 1528      OT SHORT TERM GOAL #1   Title  I with initial HEP    Baseline  dependent    Time  6     Period  Weeks    Status  On-going   05/25/18:  not currently performing, ST provided pt with exercise checklist and instructed in use     OT SHORT TERM GOAL #2   Title  Pt will perform all basic ADLS modified independently    Baseline  supervision for bathing, dressing, shower transfers    Time  6    Period  Weeks    Status  On-going   05/25/18:  pt reports continued supervision     OT [place] #3   Title  Pt will perform cold meal prep modified indpendently and basic cooking task with min A    Baseline  dependent, pt only perform basic beverage prep    Time  6    Period  Weeks    Status  On-going   05/25/18:  only performing basic beverage prep, toasting and spreading cream cheese on bagels      OT SHORT TERM GOAL #4   Title  Pt will perform basic home management modified I    Baseline  dependent    Time  6    Period  Weeks    Status  On-going   05/25/18:  only folding clothes     OT SHORT TERM GOAL #5   Title  Pt will demonstrate improved fine motor coordination as evidenced by decreasing 9 hole peg test by 5 secs for LUE.    Baseline  RUE 32.94 secs, LUE 58.46 secs    Time  6    Period  Weeks    Status  New      OT SHORT TERM GOAL #6   Title  Further assess visual perceptual skills and set goals prn    Time  6    Period  Weeks    Status  New        OT Long Term Goals - 04/02/18 1618      OT LONG TERM GOAL #1   Title  Pt will perform mod complex home managment modified independently.    Baseline  dependent    Time  12    Period  Weeks    Status  New      OT LONG TERM GOAL #2   Title  Pt will perform basic cooking with supervision.    Baseline  dependent    Time  12    Period  Weeks    Status  New      OT LONG TERM GOAL #3   Title  Pt will increase bilateral grip strength by 5 lbs for increased functional use during ADLs.    Baseline  RUE 35 lbs LUE 36 lbs    Time  12    Period  Weeks    Status  New      OT LONG TERM GOAL #4   Title  Pt will  demonstrate adequate bilateral UE strength  and endurance to retrieve a 3 lbs weight from overhead shelf with left and right UE's individually  without drops x 5 reps in standing modified independently.    Baseline  unable due to balance and decreased UE strength    Time  12    Period  Weeks    Status  New      OT LONG TERM GOAL #5   Title  Pt will demonstrate abilty to sequence a multi step functional or home management task modified independently.    Baseline  Pt requires assist for sequencing/ organization    Time  12    Period  Weeks    Status  New      Long Term Additional Goals   Additional Long Term Goals  Yes      OT LONG TERM GOAL #6   Title  Pt/ family will verbalize understanding cogntive compensation/ adapted strategies for ADLs/IADLs.    Baseline  dependent    Time  12    Period  Weeks    Status  New            Plan - 05/25/18 1507    Clinical Impression Statement  Pt is slowly progressing towards goals for LUE functional use, pt returned demonstration of HEP with cueing but reports that she is not performing at home.  Cognitive deficits remain a barrier.      Body Structure / Function / Physical Skills  ADL;Dexterity;Flexibility;Hearing;ROM;Strength;Vision;IADL;FMC;Coordination;Balance;Decreased knowledge of precautions;Endurance;Gait;Pain;Sensation;UE functional use;Mobility;GMC;Fascial restriction;Cardiopulmonary status limiting activity    Rehab Potential  Fair    OT Frequency  2x / week    OT Duration  12 weeks    OT Treatment/Interventions  Self-care/ADL training;Fluidtherapy;Therapeutic exercise;Energy conservation;Gait Training;Functional Furniture conservator/restorer;Therapeutic activities;Visual/perceptual remediation/compensation;Balance training;Patient/family education;Cognitive remediation/compensation;Passive range of motion;Manual Therapy;DME and/or AE instruction;Neuromuscular education;Paraffin;Ultrasound;Moist Heat;Aquatic Therapy    Plan  simple ADL/IADL  tasks, LUE functional use, check 9-hole peg test, environmental scanning (further assess possible L inattention)    Consulted and Agree with Plan of Care  Patient       Patient will benefit from skilled therapeutic intervention in order to improve the following deficits and impairments:     Visit Diagnosis: Muscle weakness (generalized)  Attention and concentration deficit  Visuospatial deficit  Frontal lobe and executive function deficit  Other lack of coordination  Unsteadiness on feet  Other abnormalities of gait and mobility    Problem List Patient Active Problem List   Diagnosis Date Noted  . Sinusitis 03/03/2018  . 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 Cerebrovascular accident (CVA) ([place])   . Benign essential HTN   . Tachypnea   . Sensorineural hearing loss (SNHL) of both ears 01/31/2018  . Dyslipidemia 01/31/2018  . Acute ischemic stroke ([place]) 01/30/2018  . Pneumococcal bacteremia   . Pneumococcal meningitis   . Turner syndrome   . Hypothyroidism 01/15/2018  . Generalized anxiety disorder 01/15/2018  . Bicuspid aortic valve 04/06/2013  . HTN (hypertension) 04/06/2013    [facility] 05/25/2018, 3:55 PM  [facility] [address] [place], [place], [address] Phone: [phone]   Fax:  [phone]  Name: Emma Martin MRN: [id number] Date of Birth: 11-07-1980   Vianne Bulls, OTR/L [facility] [address] [place], [place]  [address] [phone] phone [phone] 05/25/18 3:55 PM

## 2018-05-25 NOTE — Patient Instructions (Signed)
  Dad, we will need you in speech to help [place] follow through at home  Maranda is to be using a reminder for her am meds (with your supervision of course)  She should be doing a brain activity at least once a day for 20-30 minutes  Please come back with a couple of chores she can do daily or every other day   Cognitive Activities you can do at home:   - Solitaire  - [place]  - Chess/Checkers  - Crosswords (easy level)  - [place]  - jig saws  On your computer, tablet or phone: [place]

## 2018-05-25 NOTE — Patient Instructions (Signed)
     1.  Work on your OT exercises using your chart.  2.  Try to do some simple things around the house like picking out your clothes everyday, putting away silverwear, folding clothes, dusting, wiping the table, making a sandwich.

## 2018-05-25 NOTE — Patient Instructions (Signed)
(  Exercise) Monday Tuesday Wednesday Thursday Friday Saturday Sunday  Core stability: -sitting abdominal bracing  -seated march and arm raise  -seated LAQ and arm raise  -seated glut sets          Stretches: -Seated hamstring stretch  -Standing gastroc stretches          Standing strengthening -Sit to stand  -Heel/toe raises  -Minisquat          Standing Balance: -marching  -Hip abduction  -Hip extension  -Tandem balance  -Single leg standing  -Alternating forward step

## 2018-05-25 NOTE — Therapy (Signed)
[facility] [address] [place], [place], [address] Phone: [phone]   Fax:  [phone]  Speech Language Pathology Treatment  Patient Details  Name: Emma Martin MRN: [id number] Date of Birth: 06/29/1980 Referring Provider (SLP): Maurice Small, MD   Encounter Date: 05/25/2018  End of Session - 05/25/18 1507    Visit Number  4    Number of Visits  17    Date for SLP Re-Evaluation  06/25/18    SLP Start Time  66    SLP Stop Time   1447    SLP Time Calculation (min)  46 min    Activity Tolerance  Patient tolerated treatment well       Past Medical History:  Diagnosis Date  . Bicuspid aortic valve    , mild aortic insufficiency. (No SBE prophylaxis needed)  . Complication of anesthesia   . Dyslipidemia   . Generalized anxiety disorder    , with obsessive-compulsive traits.  . Hypertension   . Hypothyroidism   . Mixed gonadal dysgenesis    , (additional Y-bearing cell line) for which she reportedly underwent bilateral gonadectomy (patient unaware; needs confirmation) due to malignancy potential.  . PONV (postoperative nausea and vomiting)   . Primary amenorrhea    , treated starting at age 72 or 34 with estrogen and progesterone  . Scoliosis    , treated with Harrington rods (1997) for stabilization.  . Seasonal allergic rhinitis   . Short stature    , previously treated with growth hormone age 59-15 years.  Radford Pax syndrome    , previously followed by pediatric endocrinologist Freddy Jaksch, MD at [facility] through her early 75s).    Past Surgical History:  Procedure Laterality Date  . TEE WITHOUT CARDIOVERSION N/A 01/19/2018   Procedure: TRANSESOPHAGEAL ECHOCARDIOGRAM (TEE);  Surgeon: Jerline Pain, MD;  Location: [facility] ENDOSCOPY;  Service: Cardiovascular;  Laterality: N/A;  . TEE WITHOUT CARDIOVERSION N/A 02/02/2018   Procedure: TRANSESOPHAGEAL ECHOCARDIOGRAM (TEE);  Surgeon: Elouise Munroe, MD;  Location: [facility];  Service: Cardiology;  Laterality: N/A;    There were no vitals filed for this visit.  Subjective Assessment - 05/25/18 1456    Subjective  Pt arrives with binder, however logs and HW not completed    Currently in Pain?  No/denies            ADULT SLP TREATMENT - 05/25/18 1457      General Information   Behavior/Cognition  Alert;Cooperative;Pleasant mood      Treatment Provided   Treatment provided  Cognitive-Linquistic      Cognitive-Linquistic Treatment   Treatment focused on  Cognition    Skilled Treatment  Pt arrives without HW complete, or HEP logs filled out. She reports she has not set alarm for her meds or followed a daily schedule. Dad not present, PT called dad and requested he attend next session. Pt has not brought in her phone with speech to text app, therefore cognitive eval deferred. Pt selectively attended to simple cognitive linguistic task over 18 minutes with 5 redirections required as pt would stop and look off or become distracted by her hair. She required usual questioning cues to recall the 3 rules required to complete the task. Pt frequently asked how to complete task. Reviewed strategies for managing her schedule, meds and exercises with notes for dad.       Assessment / Recommendations / Plan   Plan  Continue with current plan of care  Progression Toward Goals   Progression toward goals  Progressing toward goals       SLP Education - 05/25/18 1503    Education Details  bring phone to therapy; dad to come to [place]; set up a schedule to follow at home    Person(s) Educated  Patient    Methods  Explanation;Demonstration;Verbal cues;Handout    Comprehension  Verbalized understanding;Verbal cues required;Need further instruction       SLP Short Term Goals - 05/25/18 1506      SLP SHORT TERM GOAL #1   Title  pt will demo sustained attention to complete 4 7-minute cognitive linguistic tasks, for improved participation  in therapy over 3 sessions    Baseline  05/18/18; 05/25/18    Time  3    Period  Weeks    Status  On-going   or 9 total visits, for all STGs     SLP SHORT TERM GOAL #2   Title  pt will demo incr'd awareness by telling SLP 3 things that are more difficult now than prior to CVA    Time  3    Period  Weeks    Status  On-going      SLP SHORT TERM GOAL #3   Title  pt will use a Child psychotherapist with occasional min A to do so    Time  3    Period  Weeks    Status  On-going       SLP Long Term Goals - 05/25/18 1507      SLP LONG TERM GOAL #1   Title  pt will demo sustained/selective attention to complete 3 12-minute cognitive linguistic tasks, for improved participation in therapy over 3 sessions    Time  7    Period  Weeks   or 17 sessions, for all LTGs   Status  On-going      SLP LONG TERM GOAL #2   Title  pt's father/family will report pt asking about schedule less than prior to ST    Time  7    Period  Weeks    Status  On-going      SLP LONG TERM GOAL #3   Title  pt will demo use of memory notebook/planner for homework, schedule verification, etc in 8 therapy sessions    Time  7    Period  Weeks    Status  On-going       Plan - 05/25/18 1504    Clinical Impression Statement  Pt continues to present with moderate impairment in attention, memory, and awareness. Compensations for carryover of PT/OT HEP as well as maintaining a daily chore/activity list initiated. Continue skilled ST to maximize cognition and carryover of compensations for cognition for safety and independence. cognitive eval deferred as pt has not brought in her phone with speech to text  - I am communicating with her by typing and writing which would invalidate portions of cognitive evaluation. Pt's father is to attend next session with pt's phone., to assist pt in following up with compensations for cognition at home    Speech Therapy Frequency  2x / week    Duration  --   8 weeks or 17  visits   Potential Considerations  Ability to learn/carryover information;Co-morbidities;Severity of impairments       Patient will benefit from skilled therapeutic intervention in order to improve the following deficits and impairments:   Cognitive communication deficit    Problem List Patient Active Problem List  Diagnosis Date Noted  . Sinusitis 03/03/2018  . Right thalamic infarction ([place]) 02/03/2018  . Cerebrovascular accident (CVA) ([place])   . Benign essential HTN   . Tachypnea   . Sensorineural hearing loss (SNHL) of both ears 01/31/2018  . Dyslipidemia 01/31/2018  . Acute ischemic stroke ([place]) 01/30/2018  . Pneumococcal bacteremia   . Pneumococcal meningitis   . Turner syndrome   . Hypothyroidism 01/15/2018  . Generalized anxiety disorder 01/15/2018  . Bicuspid aortic valve 04/06/2013  . HTN (hypertension) 04/06/2013    Lovvorn, Annye Rusk MS, [place] 05/25/2018, 3:08 PM  [facility] [address] [place], [place], [address] Phone: [phone]   Fax:  [phone]   Name: Emma Martin MRN: [id number] Date of Birth: 25-Jan-1980

## 2018-05-26 NOTE — Therapy (Signed)
[facility] [address] [place], [place], [address] Phone: [phone]   Fax:  [phone]  Physical Therapy Treatment  Patient Details  Name: Emma Martin MRN: [id number] Date of Birth: 03-17-1980 Referring Provider (PT): Maurice Small, MD   Encounter Date: 05/25/2018  CLINIC OPERATION CHANGES: [facility] is operating at a low capacity due to COVID-19.  The patient was brought into the clinic for evaluation and/or treatment following universal masking by staff, social distancing, and <10 people in the clinic.  The patient's COVID risk of complications score is 2.   PT End of Session - 05/26/18 1401    Visit Number  7    Number of Visits  25    Date for PT Re-Evaluation  07/01/18    Authorization Type  BCBS, Medicaid (4th, 5th visits are telehealth)    Authorization - Visit Number  7    Authorization - Number of Visits  30    PT Start Time  7654    PT Stop Time  1402    PT Time Calculation (min)  43 min    Activity Tolerance  Patient tolerated treatment well    Behavior During Therapy  WFL for tasks assessed/performed       Past Medical History:  Diagnosis Date  . Bicuspid aortic valve    , mild aortic insufficiency. (No SBE prophylaxis needed)  . Complication of anesthesia   . Dyslipidemia   . Generalized anxiety disorder    , with obsessive-compulsive traits.  . Hypertension   . Hypothyroidism   . Mixed gonadal dysgenesis    , (additional Y-bearing cell line) for which she reportedly underwent bilateral gonadectomy (patient unaware; needs confirmation) due to malignancy potential.  . PONV (postoperative nausea and vomiting)   . Primary amenorrhea    , treated starting at age 38 or 38 with estrogen and progesterone  . Scoliosis    , treated with Harrington rods (1997) for stabilization.  . Seasonal allergic rhinitis   . Short stature    , previously treated with growth hormone  age 105-16 years.  Radford Pax syndrome    , previously followed by pediatric endocrinologist Freddy Jaksch, MD at [facility] through her early 33s).    Past Surgical History:  Procedure Laterality Date  . TEE WITHOUT CARDIOVERSION N/A 01/19/2018   Procedure: TRANSESOPHAGEAL ECHOCARDIOGRAM (TEE);  Surgeon: Jerline Pain, MD;  Location: [facility] ENDOSCOPY;  Service: Cardiovascular;  Laterality: N/A;  . TEE WITHOUT CARDIOVERSION N/A 02/02/2018   Procedure: TRANSESOPHAGEAL ECHOCARDIOGRAM (TEE);  Surgeon: Elouise Munroe, MD;  Location: [facility];  Service: Cardiology;  Laterality: N/A;    There were no vitals filed for this visit.  Subjective Assessment - 05/26/18 1345    Subjective  No changes, no falls.  Been walking outside some.    Patient Stated Goals  Want to feel stronger moving around, to not depend on walker.    Currently in Pain?  No/denies    Pain Onset  Today                       [facility] Adult PT Treatment/Exercise - 05/26/18 0001      Ambulation/Gait   Ambulation/Gait  Yes    Ambulation/Gait Assistance  5: Supervision    Ambulation/Gait Assistance Details  Gait x 80 ft, 2 reps with cane with tripod base.  Pt continues to get out of sequence, often taking multiple steps before bringing cane  forward.  Focused most of gait training this session without cane.    Ambulation Distance (Feet)  230 Feet   x 3 reps   Assistive device  Straight cane;None    Gait Pattern  Step-through pattern;Decreased stride length;Wide base of support;Decreased arm swing - right;Decreased arm swing - left;Decreased step length - left;Decreased step length - right    Ambulation Surface  Level;Indoor    Pre-Gait Activities  Cues provided for equal, even step length    Gait Comments  used "boom whackers" as means to address reciprocal arm swing, then without them, PT provides tactile cues at shoulders for relaxed trunk rotation and arm swing.  Worked on gait at varying speeds,  including fast and normal speeds.  No overt LOB noted.      High Level Balance   High Level Balance Activities  Backward walking   Fwd/back walking with cues for smooth transition, x 8, 10 ft   High Level Balance Comments  At counter:  alternating forward step and weightshift, 2 reps x 10 reps.  Cues on second set for push-off with posterior foot.  Stagger stance forward/back weightshifting, with cues for push-off from posterior foot.  Gait x 30 ft, 4 reps, using Boom Whackers to cross in front of body, then cross behind body (used as way to work on gait sequence, even step length, and pacing of gait).  Prior to gait with Hilda Blades, used them in same forward body cross/behind body cross standing in place x 10 reps.      Knee/Hip Exercises: Aerobic   Nustep  Level 2, 4 extremities x 8 minutes, for leg strengthening and flexibility        Therapeutic Exercise  continued:  In addition to Nustep for strengthening, focused time on reprinting, organizing pt's HEP into 4 categories in chart form (see instructions) in order to have pt not feel that she has too many exercises to do at once.  Explained to patient (and father by phone after PT visit) they can divide up exercises based on their preference, as long as she is performing them each day.  Pt able to verbalize understanding of categories of exercises, with pictures attached.      PT Education - 05/26/18 1352    Education Details  Use of exercise chart to divide PT exercises into core stability, stretching, standing strength and standing balance for improved ease of performance of HEP (included instructions and pictures for Dad-not present during PT treatment)    Person(s) Educated  Patient    Methods  Explanation;Demonstration;Handout    Comprehension  Verbalized understanding       PT Short Term Goals - 05/12/18 2134      PT SHORT TERM GOAL #1   Title  Pt will perform HEP with supervision, for improved balance, strength and gait.   TARGET 04/30/2018    Baseline  needs father's cues    Time  4    Period  Weeks    Status  Partially Met    Target Date  04/30/18      PT SHORT TERM GOAL #2   Title  Pt will improve 5x sit<>stand to less than or equal to 13 seconds to demonstrate improved functional strength.    Baseline  5x sit<>stand 16 seconds; 16.15 sec via telehealth visit 05/11/2018    Time  4    Period  Weeks    Status  Not Met    Target Date  04/30/18  PT SHORT TERM GOAL #3   Title  Pt will improve Berg Balance score to at least 43/56 for decreased fall risk.    Baseline  Berg 38/56  (Scores <45/56 indicate increased fall risk)    Time  4    Period  Weeks    Status  On-going    Target Date  04/30/18      PT SHORT TERM GOAL #4   Title  Pt will improve TUG score to less than or equal to 20 seconds for decreased fall risk.    Baseline  TUG >13.5 sec indicates increased fall risk.; 17.81 sec 05/11/2018    Time  4    Period  Weeks    Status  Achieved    Target Date  04/30/18      PT SHORT TERM GOAL #5   Title  Pt will ambulate at least 50-100 ft, with cane/no assistive device, supervision, for improved household mobility.    Baseline  150 ft RW supervision    Time  4    Period  Weeks    Status  On-going    Target Date  04/30/18      PT SHORT TERM GOAL #6   Title  Pt/family will verbalize understanding of fall prevention in home environment.    Baseline  Fall risk per TUG, gait velocity, Berg    Time  4    Period  Weeks    Status  On-going    Target Date  04/30/18        PT Long Term Goals - 04/02/18 1632      PT LONG TERM GOAL #1   Title  Pt will perform progressive HEP for improved strength, balance, gait.  TARGET 06/25/2018    Baseline  no current HEP    Time  12    Period  Weeks    Status  New    Target Date  06/25/18      PT LONG TERM GOAL #2   Title  Pt will improve 5x sit<>stand to less than or equal to 10 seconds for improved functional strength/transfer efficiency.    Baseline   5x:  16 sec at eval    Time  12    Period  Weeks    Status  New    Target Date  06/25/18      PT LONG TERM GOAL #3   Title  Pt will improve TUG score to less than or equal to 13.5 seconds for decreased fall risk.    Baseline  TUG 25.25 at eval    Time  12    Period  Weeks    Status  New    Target Date  06/25/18      PT LONG TERM GOAL #4   Title  Pt will improve Berg Balance score to at least 48/56 for decreased fall risk.    Baseline  Berg 38/56 at eval    Time  12    Period  Weeks    Status  New    Target Date  06/25/18      PT LONG TERM GOAL #5   Title  Pt will ambulate independently, 1000 ft, indoor and outdoor surfaces, no LOB for improved community gait.    Baseline  150 ft Supervision RW    Time  12    Period  Weeks    Status  New    Target Date  06/25/18  Plan - 05/26/18 1401    Clinical Impression Statement  In-clinic visit performed today. with skilled PT session focused on organizing HEP into chart for ease of HEP performance as well as pre-gait and gait training.  Pt continues to have difficulty with sequencing of cane during gait; however, with and wihtout cane, pt demonstrate overall improved steadiness.  Today, pt does not have LOB without cane in forward direction with gait activities; however, she experiences 2 episode of LOB with transitioning forward/back gait activities.  She is able to regain balance with therapist close supervision.  Pt will continue to benefit from skilled PT to address gait, balance, and functional stregnthening towards independence and safety with gait.    Personal Factors and Comorbidities  Comorbidity 3+    Comorbidities  Turner syndrome, scoliosis with Harrington rod placement, pneumococcal meningitis and profound hearing loss    Examination-Activity Limitations  Stand;Transfers;Locomotion Level    Examination-Participation Restrictions  Community Activity    Stability/Clinical Decision Making  Evolving/Moderate  complexity    Rehab Potential  Good    PT Frequency  2x / week    PT Duration  12 weeks    PT Treatment/Interventions  ADLs/Self Care Home Management;DME Instruction;Gait training;Functional mobility training;Stair training;Therapeutic activities;Therapeutic exercise;Patient/family education;Neuromuscular re-education;Balance training    PT Next Visit Plan  gait training without cane, including transitions forward/back/side (may try 4-square step activity); compliant surfaces and dynamci balance/gait    PT Home Exercise Plan  [id number]    Consulted and Agree with Plan of Care  Patient    Family Member Consulted          Patient will benefit from skilled therapeutic intervention in order to improve the following deficits and impairments:  Abnormal gait, Decreased balance, Decreased mobility, Decreased strength, Difficulty walking, Postural dysfunction  Visit Diagnosis: Unsteadiness on feet  Other abnormalities of gait and mobility  Muscle weakness (generalized)     Problem List Patient Active Problem List   Diagnosis Date Noted  . Sinusitis 03/03/2018  . Right thalamic infarction ([place]) 02/03/2018  . Cerebrovascular accident (CVA) ([place])   . Benign essential HTN   . Tachypnea   . Sensorineural hearing loss (SNHL) of both ears 01/31/2018  . Dyslipidemia 01/31/2018  . Acute ischemic stroke ([place]) 01/30/2018  . Pneumococcal bacteremia   . Pneumococcal meningitis   . Turner syndrome   . Hypothyroidism 01/15/2018  . Generalized anxiety disorder 01/15/2018  . Bicuspid aortic valve 04/06/2013  . HTN (hypertension) 04/06/2013    MARRIOTT,AMY W. 05/26/2018, 2:09 PM Frazier Butt., PT  [facility] [address] [place], [place], [address] Phone: [phone]   Fax:  [phone]  Name: TOI STELLY MRN: [id number] Date of Birth: 08-Apr-1980

## 2018-05-27 ENCOUNTER — Encounter: Payer: BLUE CROSS/BLUE SHIELD | Admitting: Occupational Therapy

## 2018-05-27 ENCOUNTER — Ambulatory Visit: Payer: BLUE CROSS/BLUE SHIELD | Admitting: Physical Therapy

## 2018-06-01 ENCOUNTER — Ambulatory Visit: Payer: BC Managed Care – PPO | Admitting: Physical Therapy

## 2018-06-01 ENCOUNTER — Ambulatory Visit: Payer: BLUE CROSS/BLUE SHIELD | Admitting: Physical Therapy

## 2018-06-01 ENCOUNTER — Ambulatory Visit: Payer: BC Managed Care – PPO | Admitting: Speech Pathology

## 2018-06-01 ENCOUNTER — Other Ambulatory Visit: Payer: Self-pay

## 2018-06-01 ENCOUNTER — Ambulatory Visit: Payer: BC Managed Care – PPO | Admitting: Occupational Therapy

## 2018-06-01 ENCOUNTER — Encounter: Payer: Self-pay | Admitting: Physical Therapy

## 2018-06-01 ENCOUNTER — Encounter: Payer: BLUE CROSS/BLUE SHIELD | Admitting: Occupational Therapy

## 2018-06-01 DIAGNOSIS — R41842 Visuospatial deficit: Secondary | ICD-10-CM | POA: Diagnosis not present

## 2018-06-01 DIAGNOSIS — R2689 Other abnormalities of gait and mobility: Secondary | ICD-10-CM

## 2018-06-01 DIAGNOSIS — R41844 Frontal lobe and executive function deficit: Secondary | ICD-10-CM | POA: Diagnosis not present

## 2018-06-01 DIAGNOSIS — I69315 Cognitive social or emotional deficit following cerebral infarction: Secondary | ICD-10-CM | POA: Diagnosis not present

## 2018-06-01 DIAGNOSIS — R278 Other lack of coordination: Secondary | ICD-10-CM

## 2018-06-01 DIAGNOSIS — R41840 Attention and concentration deficit: Secondary | ICD-10-CM | POA: Diagnosis not present

## 2018-06-01 DIAGNOSIS — R41841 Cognitive communication deficit: Secondary | ICD-10-CM

## 2018-06-01 DIAGNOSIS — R2681 Unsteadiness on feet: Secondary | ICD-10-CM | POA: Diagnosis not present

## 2018-06-01 DIAGNOSIS — M6281 Muscle weakness (generalized): Secondary | ICD-10-CM

## 2018-06-01 NOTE — Therapy (Addendum)
[facility] [address] [place], [place], [address] Phone: [phone]   Fax:  [phone]  Physical Therapy Treatment  Patient Details  Name: Emma Martin MRN: [id number] Date of Birth: August 15, 1980 Referring Provider (PT): Maurice Small, MD   Encounter Date: 06/01/2018  CLINIC OPERATION CHANGES: [facility] is operating at a low capacity due to COVID-19.  The patient was brought into the clinic for evaluation and/or treatment following universal masking by staff, social distancing, and <10 people in the clinic.  The patient's COVID risk of complications score is 2. PT End of Session - 06/01/18 1526    Visit Number  8    Number of Visits  25    Date for PT Re-Evaluation  07/01/18    Authorization Type  BCBS, Medicaid (4th, 5th visits are telehealth); medicaid approval 10PT visits 05/26/2018-08/03/2018    Authorization Time Period  05/26/2018-08/03/2018    Authorization - Visit Number  7   1   Authorization - Number of Visits  30   10 (medicaid)   PT Start Time  1319    PT Stop Time  1402    PT Time Calculation (min)  43 min    Activity Tolerance  Patient tolerated treatment well    Behavior During Therapy  WFL for tasks assessed/performed       Past Medical History:  Diagnosis Date  . Bicuspid aortic valve    , mild aortic insufficiency. (No SBE prophylaxis needed)  . Complication of anesthesia   . Dyslipidemia   . Generalized anxiety disorder    , with obsessive-compulsive traits.  . Hypertension   . Hypothyroidism   . Mixed gonadal dysgenesis    , (additional Y-bearing cell line) for which she reportedly underwent bilateral gonadectomy (patient unaware; needs confirmation) due to malignancy potential.  . PONV (postoperative nausea and vomiting)   . Primary amenorrhea    , treated starting at age 38 or 38 with estrogen and progesterone  . Scoliosis    , treated with Harrington rods  (1997) for stabilization.  . Seasonal allergic rhinitis   . Short stature    , previously treated with growth hormone age 108-15 years.  Radford Pax syndrome    , previously followed by pediatric endocrinologist Freddy Jaksch, MD at [facility] through her early 72s).    Past Surgical History:  Procedure Laterality Date  . TEE WITHOUT CARDIOVERSION N/A 01/19/2018   Procedure: TRANSESOPHAGEAL ECHOCARDIOGRAM (TEE);  Surgeon: Jerline Pain, MD;  Location: [facility] ENDOSCOPY;  Service: Cardiovascular;  Laterality: N/A;  . TEE WITHOUT CARDIOVERSION N/A 02/02/2018   Procedure: TRANSESOPHAGEAL ECHOCARDIOGRAM (TEE);  Surgeon: Elouise Munroe, MD;  Location: [facility];  Service: Cardiology;  Laterality: N/A;    There were no vitals filed for this visit.  Subjective Assessment - 06/01/18 1323    Subjective  No changes, no falls.    Patient Stated Goals  Want to feel stronger moving around, to not depend on walker.    Currently in Pain?  Yes    Pain Score  2     Pain Location  Leg    Pain Orientation  Right;Posterior    Pain Descriptors / Indicators  Dull    Pain Onset  Today    Pain Frequency  Intermittent                       [facility] Adult PT Treatment/Exercise - 06/01/18 0001  Ambulation/Gait   Ambulation/Gait  Yes    Ambulation/Gait Assistance  5: Supervision    Ambulation/Gait Assistance Details  Initial gait with tripod base cane, pt sequencing cane every several steps.  Focused most of session without use of cane.    Ambulation Distance (Feet)  230 Feet    Assistive device  Straight cane;None    Gait Pattern  Step-through pattern;Decreased stride length;Wide base of support;Decreased arm swing - right;Decreased arm swing - left;Decreased step length - left;Decreased step length - right    Ambulation Surface  Level;Indoor    Pre-Gait Activities  Cues provided for equal, even step length and equal/even arm swing (pt tends to have increased RUE arm swing)     Gait Comments  Used "boom whackers" to assist with reciprocal arm swing, then tactile cues at shoulders for reciprocal arm swing, then VCs and visual cues for equal arm swing.  Pt needs frequent reminders to increase L arm swing.      High Level Balance   High Level Balance Activities  Backward walking;Turns;Figure 8 turns   Forward/back walking in gym area with supervision   High Level Balance Comments  Four-square step activity to incorporate forward/side/back stepping, with close supervision, x 4 reps.  Cues to increase step length at times.  Negotiating around cones and over obstacles, with close supervision.          Balance Exercises - 06/01/18 1523      Balance Exercises: Standing   Stepping Strategy  Anterior;Posterior;Lateral;Foam/compliant surface;UE support;10 reps   in parallel bars   Other Standing Exercises  Step taps to 1, 2, 3 cones, alternating legs, with 2>1 UE support, then step over obstacles forward and side, x 10 reps each leg.  Alternating step taps to 6" step, then forward step up/up,down/down x 10 reps each leg, then single leg step ups x 10 reps with cues for light UE support (1-2 UE support throughout).  Practiced to address SLS and L hip stability.          PT Short Term Goals - 06/01/18 1528      PT SHORT TERM GOAL #1   Title  Pt will perform HEP with supervision, for improved balance, strength and gait.  TARGET 06/11/2018    Baseline  needs father's cues    Time  4    Period  Weeks    Status  On-going    Target Date  06/11/18      PT SHORT TERM GOAL #2   Title  Pt will improve 5x sit<>stand to less than or equal to 13 seconds to demonstrate improved functional strength.    Baseline  5x sit<>stand 16 seconds; 16.15 sec via telehealth visit 05/11/2018    Time  4    Period  Weeks    Status  On-going    Target Date  06/11/18      PT SHORT TERM GOAL #3   Title  Pt will improve Berg Balance score to at least 43/56 for decreased fall risk.    Baseline   Merrilee Jansky 38/56  (Scores <45/56 indicate increased fall risk); 43/56 05/25/2018    Time  4    Period  Weeks    Status  Achieved    Target Date  --      PT SHORT TERM GOAL #4   Title  Pt will improve TUG score to less than or equal to 13.5 seconds for decreased fall risk.    Baseline  TUG >13.5 sec indicates  increased fall risk.; 17.81 sec 05/11/2018    Time  4    Period  Weeks    Status  Revised    Target Date  06/11/18      PT SHORT TERM GOAL #5   Title  Pt will ambulate at least 50-100 ft, with cane/no assistive device, supervision, for improved household mobility.    Baseline  150 ft RW supervision    Time  4    Period  Weeks    Status  On-going    Target Date  06/11/18      PT SHORT TERM GOAL #6   Title  Pt/family will verbalize understanding of fall prevention in home environment.    Baseline  Fall risk per TUG, gait velocity, Berg    Time  4    Period  Weeks    Status  On-going    Target Date  06/11/18        PT Long Term Goals - 04/02/18 1632      PT LONG TERM GOAL #1   Title  Pt will perform progressive HEP for improved strength, balance, gait.  TARGET 06/25/2018    Baseline  no current HEP    Time  12    Period  Weeks    Status  New    Target Date  06/25/18      PT LONG TERM GOAL #2   Title  Pt will improve 5x sit<>stand to less than or equal to 10 seconds for improved functional strength/transfer efficiency.    Baseline  5x:  16 sec at eval    Time  12    Period  Weeks    Status  New    Target Date  06/25/18      PT LONG TERM GOAL #3   Title  Pt will improve TUG score to less than or equal to 13.5 seconds for decreased fall risk.    Baseline  TUG 25.25 at eval    Time  12    Period  Weeks    Status  New    Target Date  06/25/18      PT LONG TERM GOAL #4   Title  Pt will improve Berg Balance score to at least 48/56 for decreased fall risk.    Baseline  Berg 38/56 at eval    Time  12    Period  Weeks    Status  New    Target Date  06/25/18      PT LONG  TERM GOAL #5   Title  Pt will ambulate independently, 1000 ft, indoor and outdoor surfaces, no LOB for improved community gait.    Baseline  150 ft Supervision RW    Time  12    Period  Weeks    Status  New    Target Date  06/25/18            Plan - 06/01/18 1532    Clinical Impression Statement  In-clinic visit performed today, with skilled PT session focused on gait and balance training, for improved SLS and hip stability, compliant surface balance, changes of directions with gait.  Pt performs most of session with no cane and overall continues to demonstrate improved stability with gait.  With balance exercises for SLS and compliant surfaces, she tends to need at least 1 UE support.  With obstacle negotation in posterior and side direction, she needs to take bigger steps with LLE.  Please note modified STGs, which will  be assessed next visit.  (Pt is to have surgery in preparation for cochlear implants and father is aware he will contact us to reschedule when MD clears patient.)    Personal Factors and Comorbidities  Comorbidity 3+    Comorbidities  Turner syndrome, scoliosis with Harrington rod placement, pneumococcal meningitis and profound hearing loss    Examination-Activity Limitations  Stand;Transfers;Locomotion Level    Examination-Participation Restrictions  Community Activity    Stability/Clinical Decision Making  Evolving/Moderate complexity    Rehab Potential  Good    PT Frequency  2x / week    PT Duration  12 weeks    PT Treatment/Interventions  ADLs/Self Care Home Management;DME Instruction;Gait training;Functional mobility training;Stair training;Therapeutic activities;Therapeutic exercise;Patient/family education;Neuromuscular re-education;Balance training    PT Next Visit Plan  Check STGs and update LTGs as needed.  Gait training without cane.  (Will need to reschedule more when dad calls back after her sinus surery in prep for cochlear implant)    PT Saks and Agree with Plan of Care  Patient    Family Member Consulted          Patient will benefit from skilled therapeutic intervention in order to improve the following deficits and impairments:  Abnormal gait, Decreased balance, Decreased mobility, Decreased strength, Difficulty walking, Postural dysfunction  Visit Diagnosis: Unsteadiness on feet  Other abnormalities of gait and mobility     Problem List Patient Active Problem List   Diagnosis Date Noted  . Sinusitis 03/03/2018  . Right thalamic infarction ([place]) 02/03/2018  . Cerebrovascular accident (CVA) ([place])   . Benign essential HTN   . Tachypnea   . Sensorineural hearing loss (SNHL) of both ears 01/31/2018  . Dyslipidemia 01/31/2018  . Acute ischemic stroke ([place]) 01/30/2018  . Pneumococcal bacteremia   . Pneumococcal meningitis   . Turner syndrome   . Hypothyroidism 01/15/2018  . Generalized anxiety disorder 01/15/2018  . Bicuspid aortic valve 04/06/2013  . HTN (hypertension) 04/06/2013    MARRIOTT,AMY W. 06/01/2018, 3:42 PM  Frazier Butt., PT  [facility] [address] [place], [place], [address] Phone: [phone]   Fax:  [phone]  Name: JANYCE ELLINGER MRN: [id number] Date of Birth: 01-12-1981

## 2018-06-01 NOTE — Therapy (Addendum)
[facility] [address] [place], [place], [address] Phone: [phone]   Fax:  [phone]  Occupational Therapy Treatment  Patient Details  Name: Emma Martin MRN: [id number] Date of Birth: [birth year] Referring Provider (OT): Maurice Small, MD   Encounter Date: 06/01/2018  OT End of Session - 06/01/18 1516    Visit Number  5    Number of Visits  25    Date for OT Re-Evaluation  07/01/18    Authorization Type  BCBS / Medicaid    Authorization Time Period  BCBS--60 VL combined PT/OT counts as 2 visits if seen on same day.  Medicaid 3 visits approved 05/25/18-06/14/18    Authorization - Visit Number  2    Authorization - Number of Visits  3   Medicaid   OT Start Time  5176    OT Stop Time  1533    OT Time Calculation (min)  41 min    Activity Tolerance  Patient tolerated treatment well    Behavior During Therapy  WFL for tasks assessed/performed       Past Medical History:  Diagnosis Date  . Bicuspid aortic valve    , mild aortic insufficiency. (No SBE prophylaxis needed)  . Complication of anesthesia   . Dyslipidemia   . Generalized anxiety disorder    , with obsessive-compulsive traits.  . Hypertension   . Hypothyroidism   . Mixed gonadal dysgenesis    , (additional Y-bearing cell line) for which she reportedly underwent bilateral gonadectomy (patient unaware; needs confirmation) due to malignancy potential.  . PONV (postoperative nausea and vomiting)   . Primary amenorrhea    , treated starting at age 89 or 57 with estrogen and progesterone  . Scoliosis    , treated with Harrington rods (1997) for stabilization.  . Seasonal allergic rhinitis   . Short stature    , previously treated with growth hormone age 69-15 years.  Radford Pax syndrome    , previously followed by pediatric endocrinologist Freddy Jaksch, MD at [facility] through her early 34s).    Past Surgical History:  Procedure Laterality  Date  . TEE WITHOUT CARDIOVERSION N/A 01/19/2018   Procedure: TRANSESOPHAGEAL ECHOCARDIOGRAM (TEE);  Surgeon: Jerline Pain, MD;  Location: [facility] ENDOSCOPY;  Service: Cardiovascular;  Laterality: N/A;  . TEE WITHOUT CARDIOVERSION N/A 02/02/2018   Procedure: TRANSESOPHAGEAL ECHOCARDIOGRAM (TEE);  Surgeon: Elouise Munroe, MD;  Location: [facility];  Service: Cardiology;  Laterality: N/A;    There were no vitals filed for this visit.  Subjective Assessment - 06/01/18 1515    Subjective   Pt reports that she is making a sandwich at home, but is not performing cleaning tasks  (OT typed questions/cues/directions during session for communication)    Pertinent History  meningitis, CVA 01/30/2018    Currently in Pain?  No/denies       CLINIC OPERATION CHANGES: [facility] at [facility] low [facility] due to COVID-19.  The patient was brought into the clinic for evaluation and/or treatment following universal masking by staff, patient masking, social distancing, and <10 people in the clinic.  The patient's COVID risk of complications score is 2.        Simple cold meal prep (peanutbutter and jelly sandwich).  Pt was able to complete independently and safely.  Simple environmental scanning in min distracting environment.  Pt able to locate 10/15 items initially (67%), but then found 4 additional upon 2nd pass and needed  min cueing for last item.  (3/5 missed on R side and 2 on L side).  Picking up blocks with gripper set on level 2 (black spring) for sustained grip strength with min difficulty with R hand and on level 1 for reaming half blocks with L hand (level 2 was too difficult).  Pt completed 24 piece puzzle for visual scanning, organization, attention with good accuracy with incr time.      OT Short Term Goals - 06/01/18 1509      OT SHORT TERM GOAL #1   Title  I with initial HEP    Baseline  dependent    Time  6    Period  Weeks    Status   On-going   05/25/18:  not currently performing, ST provided pt with exercise checklist and instructed in use     OT SHORT TERM GOAL #2   Title  Pt will perform all basic ADLS modified independently    Baseline  supervision for bathing, dressing, shower transfers    Time  6    Period  Weeks    Status  On-going   05/25/18:  pt reports continued supervision     OT SHORT TERM GOAL #3   Title  Pt will perform cold meal prep modified indpendently and basic cooking task with min A    Baseline  dependent, pt only perform basic beverage prep    Time  6    Period  Weeks    Status  On-going   05/25/18:  only performing basic beverage prep, toasting and spreading cream cheese on bagels.  06/01/18:  partially met (met for cold meal prep in clinic)     OT SHORT TERM GOAL #4   Title  Pt will perform basic home management modified I    Baseline  dependent    Time  6    Period  Weeks    Status  On-going   05/25/18:  only folding clothes     OT SHORT TERM GOAL #5   Title  Pt will demonstrate improved fine motor coordination as evidenced by decreasing 9 hole peg test by 5 secs for LUE.    Baseline  RUE 32.94 secs, LUE 58.46 secs    Time  6    Period  Weeks    Status  Achieved   06/01/18:  R-27.34sec, L-36.75sec     OT SHORT TERM GOAL #6   Title  Further assess visual perceptual skills and set goals prn--see LTGs    Time  6    Period  Weeks    Status  Deferred        OT Long Term Goals - 06/01/18 1525      OT LONG TERM GOAL #1   Title  Pt will perform mod complex home managment modified independently.    Baseline  dependent    Time  12    Period  Weeks    Status  New      OT LONG TERM GOAL #2   Title  Pt will perform basic cooking with supervision.    Baseline  dependent    Time  12    Period  Weeks    Status  New      OT LONG TERM GOAL #3   Title  Pt will increase bilateral grip strength by 5 lbs for increased functional use during ADLs.    Baseline  RUE 35 lbs LUE 36 lbs    Time  12    Period  Weeks    Status  New      OT LONG TERM GOAL #4   Title  Pt will demonstrate adequate bilateral UE strength and endurance to retrieve a 3 lbs weight from overhead shelf with left and right UE's individually  without drops x 5 reps in standing modified independently.    Baseline  unable due to balance and decreased UE strength    Time  12    Period  Weeks    Status  New      OT LONG TERM GOAL #5   Title  Pt will demonstrate abilty to sequence a multi step functional or home management task modified independently.    Baseline  Pt requires assist for sequencing/ organization    Time  12    Period  Weeks    Status  New      OT LONG TERM GOAL #6   Title  Pt/ family will verbalize understanding cogntive compensation/ adapted strategies for ADLs/IADLs.    Baseline  dependent    Time  12    Period  Weeks    Status  New      OT LONG TERM GOAL #7   Title  Pt will perform simple environmental scanning with at least 95% accuracy for improved attention and safety.    Baseline  67%    Time  12    Period  Weeks    Status  New            Plan - 06/01/18 1517    Clinical Impression Statement  Pt is progressing towards goals with improved coordination.  Pt also able to make sandwich today independently.  Pt reports that she is not performing cleaning tasks at home.  Pt cancelled next week's appt due to outpatient surgery.    Body Structure / Function / Physical Skills  ADL;Dexterity;Flexibility;Hearing;ROM;Strength;Vision;IADL;FMC;Coordination;Balance;Decreased knowledge of precautions;Endurance;Gait;Pain;Sensation;UE functional use;Mobility;GMC;Fascial restriction;Cardiopulmonary status limiting activity    Rehab Potential  Fair    OT Frequency  2x / week    OT Duration  12 weeks    OT Treatment/Interventions  Self-care/ADL training;Fluidtherapy;Therapeutic exercise;Energy conservation;Gait Training;Functional Furniture conservator/restorer;Therapeutic activities;Visual/perceptual  remediation/compensation;Balance training;Patient/family education;Cognitive remediation/compensation;Passive range of motion;Manual Therapy;DME and/or AE instruction;Neuromuscular education;Paraffin;Ultrasound;Moist Heat;Aquatic Therapy    Plan  simple cooking task, functional reaching for 1-2lb object (check for safety).  Medicaid date ext requested 06/01/18, ?then request additional visits     Consulted and Agree with Plan of Care  Patient       Patient will benefit from skilled therapeutic intervention in order to improve the following deficits and impairments:     Visit Diagnosis: Attention and concentration deficit  Visuospatial deficit  Frontal lobe and executive function deficit  Other lack of coordination  Unsteadiness on feet  Other abnormalities of gait and mobility  Muscle weakness (generalized)    Problem List Patient Active Problem List   Diagnosis Date Noted  . Sinusitis 03/03/2018  . 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 Cerebrovascular accident (CVA) ([place])   . Benign essential HTN   . Tachypnea   . Sensorineural hearing loss (SNHL) of both ears 01/31/2018  . Dyslipidemia 01/31/2018  . Acute ischemic stroke ([place]) 01/30/2018  . Pneumococcal bacteremia   . Pneumococcal meningitis   . Turner syndrome   . Hypothyroidism 01/15/2018  . Generalized anxiety disorder 01/15/2018  . Bicuspid aortic valve 04/06/2013  . HTN (hypertension) 04/06/2013    [facility] 06/01/2018, 4:01 PM  [facility]  [facility] [address] [place], [place], [address] Phone: [phone]   Fax:  [phone]  Name: Emma Martin MRN: [id number] Date of Birth: 14-Mar-1980   Vianne Bulls, OTR/L [facility] [address] [place], [place]  [address] [phone] phone [phone] 06/01/18 4:03 PM

## 2018-06-01 NOTE — Patient Instructions (Signed)
I would like you to use your memory notebook to write down things that you need to remember or when you are asking the same questions over and over.   I would like you to date the top and write down things you need to know such as what is for dinner or where you are going.   Can you fill out the calendar for May I would love for you to fill out the calendar with your appointments, include errands or birthdays and keep this in your notebook, as well as anything important going on   This will be your homework  Then cross off each day that is over, keep doing this daily  When you come back, I'll look at your calendar

## 2018-06-02 ENCOUNTER — Other Ambulatory Visit (HOSPITAL_COMMUNITY): Payer: Self-pay | Admitting: Otolaryngology

## 2018-06-02 DIAGNOSIS — H905 Unspecified sensorineural hearing loss: Secondary | ICD-10-CM

## 2018-06-03 ENCOUNTER — Encounter: Payer: BLUE CROSS/BLUE SHIELD | Admitting: Occupational Therapy

## 2018-06-03 ENCOUNTER — Ambulatory Visit: Payer: BLUE CROSS/BLUE SHIELD | Admitting: Physical Therapy

## 2018-06-03 DIAGNOSIS — Z1159 Encounter for screening for other viral diseases: Secondary | ICD-10-CM | POA: Diagnosis not present

## 2018-06-08 ENCOUNTER — Encounter: Payer: BLUE CROSS/BLUE SHIELD | Admitting: Occupational Therapy

## 2018-06-08 ENCOUNTER — Ambulatory Visit: Payer: BC Managed Care – PPO | Admitting: Speech Pathology

## 2018-06-08 ENCOUNTER — Encounter: Payer: Medicaid Other | Admitting: Occupational Therapy

## 2018-06-08 ENCOUNTER — Encounter: Payer: Self-pay | Admitting: Speech Pathology

## 2018-06-08 ENCOUNTER — Ambulatory Visit: Payer: BC Managed Care – PPO | Admitting: Physical Therapy

## 2018-06-08 DIAGNOSIS — J0131 Acute recurrent sphenoidal sinusitis: Secondary | ICD-10-CM | POA: Diagnosis not present

## 2018-06-08 DIAGNOSIS — J320 Chronic maxillary sinusitis: Secondary | ICD-10-CM | POA: Diagnosis not present

## 2018-06-08 DIAGNOSIS — J0101 Acute recurrent maxillary sinusitis: Secondary | ICD-10-CM | POA: Diagnosis not present

## 2018-06-08 DIAGNOSIS — J328 Other chronic sinusitis: Secondary | ICD-10-CM | POA: Diagnosis not present

## 2018-06-08 DIAGNOSIS — J323 Chronic sphenoidal sinusitis: Secondary | ICD-10-CM | POA: Diagnosis not present

## 2018-06-08 DIAGNOSIS — Z9889 Other specified postprocedural states: Secondary | ICD-10-CM | POA: Diagnosis not present

## 2018-06-08 DIAGNOSIS — Z8661 Personal history of infections of the central nervous system: Secondary | ICD-10-CM | POA: Diagnosis not present

## 2018-06-08 DIAGNOSIS — H903 Sensorineural hearing loss, bilateral: Secondary | ICD-10-CM | POA: Diagnosis not present

## 2018-06-08 DIAGNOSIS — J3489 Other specified disorders of nose and nasal sinuses: Secondary | ICD-10-CM | POA: Diagnosis not present

## 2018-06-08 DIAGNOSIS — J0121 Acute recurrent ethmoidal sinusitis: Secondary | ICD-10-CM | POA: Diagnosis not present

## 2018-06-08 DIAGNOSIS — J322 Chronic ethmoidal sinusitis: Secondary | ICD-10-CM | POA: Diagnosis not present

## 2018-06-08 NOTE — Therapy (Signed)
[facility] [address] [place], [place], [address] Phone: [phone]   Fax:  [phone]  Speech Language Pathology Treatment  Patient Details  Name: Emma Martin MRN: [id number] Date of Birth: 12/01/1980 Referring Provider (SLP): Maurice Small, MD   Encounter Date: 06/01/2018  End of Session - 06/08/18 0924    Visit Number  5    Number of Visits  17    Date for SLP Re-Evaluation  06/25/18    SLP Start Time  3532    SLP Stop Time   1445    SLP Time Calculation (min)  42 min    Activity Tolerance  Patient tolerated treatment well       Past Medical History:  Diagnosis Date  . Bicuspid aortic valve    , mild aortic insufficiency. (No SBE prophylaxis needed)  . Complication of anesthesia   . Dyslipidemia   . Generalized anxiety disorder    , with obsessive-compulsive traits.  . Hypertension   . Hypothyroidism   . Mixed gonadal dysgenesis    , (additional Y-bearing cell line) for which she reportedly underwent bilateral gonadectomy (patient unaware; needs confirmation) due to malignancy potential.  . PONV (postoperative nausea and vomiting)   . Primary amenorrhea    , treated starting at age 72 or 71 with estrogen and progesterone  . Scoliosis    , treated with Harrington rods (1997) for stabilization.  . Seasonal allergic rhinitis   . Short stature    , previously treated with growth hormone age 74-15 years.  Radford Pax syndrome    , previously followed by pediatric endocrinologist Freddy Jaksch, MD at [facility] through her early 36s).    Past Surgical History:  Procedure Laterality Date  . TEE WITHOUT CARDIOVERSION N/A 01/19/2018   Procedure: TRANSESOPHAGEAL ECHOCARDIOGRAM (TEE);  Surgeon: Jerline Pain, MD;  Location: [facility] ENDOSCOPY;  Service: Cardiovascular;  Laterality: N/A;  . TEE WITHOUT CARDIOVERSION N/A 02/02/2018   Procedure: TRANSESOPHAGEAL ECHOCARDIOGRAM (TEE);  Surgeon: Elouise Munroe, MD;  Location: [place];  Service: Cardiology;  Laterality: N/A;    There were no vitals filed for this visit.  Subjective Assessment - 06/08/18 0918    Subjective  "It's hard to get her to follow through with tasks unless I am there reminding her"    Patient is accompained by:  Family member   dad   Currently in Pain?  No/denies              SLP Education - 06/08/18 [phone]    Education Details  use of memory book and calendar; daily schedule    Person(s) Educated  Patient;Parent(s)    Methods  Explanation;Demonstration;Verbal cues    Comprehension  Verbalized understanding;Returned demonstration;Need further instruction;Verbal cues required       SLP Short Term Goals - 06/08/18 0923      SLP SHORT TERM GOAL #1   Title  pt will demo sustained attention to complete 4 7-minute cognitive linguistic tasks, for improved participation in therapy over 3 sessions    Baseline  05/18/18; 05/25/18; 06/01/18    Time  3    Period  Weeks    Status  Achieved   or 9 total visits, for all STGs     SLP SHORT TERM GOAL #2   Title  pt will demo incr'd awareness by telling SLP 3 things that are more difficult now than prior to CVA    Time  2    Period  Weeks    Status  On-going      SLP SHORT TERM GOAL #3   Title  pt will use a Child psychotherapist with occasional min A to do so    Time  2    Period  Weeks    Status  On-going       SLP Long Term Goals - 06/08/18 2831      SLP LONG TERM GOAL #1   Title  pt will demo sustained/selective attention to complete 3 12-minute cognitive linguistic tasks, for improved participation in therapy over 3 sessions    Time  6    Period  Weeks   or 17 sessions, for all LTGs   Status  On-going      SLP LONG TERM GOAL #2   Title  pt's father/family will report pt asking about schedule less than prior to ST    Time  6    Period  Weeks    Status  On-going      SLP LONG TERM GOAL #3   Title  pt will demo use of  memory notebook/planner for homework, schedule verification, etc in 8 therapy sessions    Time  6    Period  Weeks    Status  On-going       Plan - 06/08/18 5176    Clinical Impression Statement  Pt continues to present with moderate impairment in attention, memory, and awareness. Compensations for carryover of PT/OT HEP as well as maintaining a daily chore/activity list initiated. Continue skilled ST to maximize cognition and carryover of compensations for cognition for safety and independence. cognitive eval deferred as pt has not brought in her phone with speech to text  - I am communicating with her by typing and writing which would invalidate portions of cognitive evaluation. Pt's father is to attend next session with pt's phone., to assist pt in following up with compensations for cognition at home. Pt will be on hold for 1-2 weeks due to sinus surgery.     Speech Therapy Frequency  2x / week    Duration  --   8 weeks or 17 visits   Treatment/Interventions  Cueing hierarchy;SLP instruction and feedback;Compensatory strategies;Functional tasks;Cognitive reorganization;Internal/external aids;Patient/family education    Potential to Achieve Goals  Good    Potential Considerations  Ability to learn/carryover information;Co-morbidities;Severity of impairments    Consulted and Agree with Plan of Care  Patient;Family member/caregiver    Family Member Consulted  father       Patient will benefit from skilled therapeutic intervention in order to improve the following deficits and impairments:   Cognitive communication deficit    Problem List Patient Active Problem List   Diagnosis Date Noted  . Sinusitis 03/03/2018  . Right thalamic infarction ([place]) 02/03/2018  . Cerebrovascular accident (CVA) ([place])   . Benign essential HTN   . Tachypnea   . Sensorineural hearing loss (SNHL) of both ears 01/31/2018  . Dyslipidemia 01/31/2018  . Acute ischemic stroke ([place]) 01/30/2018  . Pneumococcal  bacteremia   . Pneumococcal meningitis   . Turner syndrome   . Hypothyroidism 01/15/2018  . Generalized anxiety disorder 01/15/2018  . Bicuspid aortic valve 04/06/2013  . HTN (hypertension) 04/06/2013    Lovvorn, Annye Rusk MS, CCC-SLP 06/08/2018, 9:25 AM  [facility] [address] [place], [place], [address] Phone: [phone]   Fax:  [phone]   Name: Emma Martin MRN: [id number] Date of Birth: Dec 29, 1980

## 2018-06-10 ENCOUNTER — Encounter: Payer: BLUE CROSS/BLUE SHIELD | Admitting: Occupational Therapy

## 2018-06-10 DIAGNOSIS — Z23 Encounter for immunization: Secondary | ICD-10-CM | POA: Diagnosis not present

## 2018-06-11 ENCOUNTER — Ambulatory Visit: Payer: BC Managed Care – PPO

## 2018-06-11 ENCOUNTER — Other Ambulatory Visit: Payer: Self-pay

## 2018-06-11 DIAGNOSIS — R41840 Attention and concentration deficit: Secondary | ICD-10-CM | POA: Diagnosis not present

## 2018-06-11 DIAGNOSIS — R41842 Visuospatial deficit: Secondary | ICD-10-CM | POA: Diagnosis not present

## 2018-06-11 DIAGNOSIS — R2681 Unsteadiness on feet: Secondary | ICD-10-CM | POA: Diagnosis not present

## 2018-06-11 DIAGNOSIS — R41841 Cognitive communication deficit: Secondary | ICD-10-CM | POA: Diagnosis not present

## 2018-06-11 DIAGNOSIS — I69315 Cognitive social or emotional deficit following cerebral infarction: Secondary | ICD-10-CM | POA: Diagnosis not present

## 2018-06-11 DIAGNOSIS — R2689 Other abnormalities of gait and mobility: Secondary | ICD-10-CM | POA: Diagnosis not present

## 2018-06-11 DIAGNOSIS — M6281 Muscle weakness (generalized): Secondary | ICD-10-CM | POA: Diagnosis not present

## 2018-06-11 DIAGNOSIS — R278 Other lack of coordination: Secondary | ICD-10-CM | POA: Diagnosis not present

## 2018-06-11 DIAGNOSIS — R41844 Frontal lobe and executive function deficit: Secondary | ICD-10-CM | POA: Diagnosis not present

## 2018-06-11 NOTE — Patient Instructions (Signed)
Provide a week's meal plan for dinners. Do a cognitive task every day, once at least.

## 2018-06-11 NOTE — Therapy (Signed)
[facility] [address] [place], [place], [address] Phone: [phone]   Fax:  [phone]  Speech Language Pathology Treatment  Patient Details  Name: Emma Martin MRN: [id number] Date of Birth: 01/25/1980 Referring Provider (SLP): Emma Small, MD   Encounter Date: 06/11/2018  End of Session - 06/11/18 1506    Visit Number  6    Number of Visits  17    Date for SLP Re-Evaluation  06/25/18    Authorization Type  end date 08-04-18 medicaid    Authorization - Visit Number  5    Authorization - Number of Visits  8    SLP Start Time  1150    SLP Stop Time   1230    SLP Time Calculation (min)  40 min    Activity Tolerance  Patient tolerated treatment well       Past Medical History:  Diagnosis Date  . Bicuspid aortic valve    , mild aortic insufficiency. (No SBE prophylaxis needed)  . Complication of anesthesia   . Dyslipidemia   . Generalized anxiety disorder    , with obsessive-compulsive traits.  . Hypertension   . Hypothyroidism   . Mixed gonadal dysgenesis    , (additional Y-bearing cell line) for which she reportedly underwent bilateral gonadectomy (patient unaware; needs confirmation) due to malignancy potential.  . PONV (postoperative nausea and vomiting)   . Primary amenorrhea    , treated starting at age 28 or 53 with estrogen and progesterone  . Scoliosis    , treated with Harrington rods (1997) for stabilization.  . Seasonal allergic rhinitis   . Short stature    , previously treated with growth hormone age 30-15 years.  Radford Pax syndrome    , previously followed by pediatric endocrinologist Freddy Jaksch, MD at [facility] through her early 60s).    Past Surgical History:  Procedure Laterality Date  . TEE WITHOUT CARDIOVERSION N/A 01/19/2018   Procedure: TRANSESOPHAGEAL ECHOCARDIOGRAM (TEE);  Surgeon: Jerline Pain, MD;  Location: [facility] ENDOSCOPY;  Service: Cardiovascular;  Laterality:  N/A;  . TEE WITHOUT CARDIOVERSION N/A 02/02/2018   Procedure: TRANSESOPHAGEAL ECHOCARDIOGRAM (TEE);  Surgeon: Elouise Munroe, MD;  Location: [facility];  Service: Cardiology;  Laterality: N/A;    There were no vitals filed for this visit.  Subjective Assessment - 06/11/18 1152    Subjective  Pt arrives with father; using speech to text on pt's phone to communicate.    Patient is accompained by:  Family member   father   Currently in Pain?  No/denies            ADULT SLP TREATMENT - 06/11/18 1153      General Information   Behavior/Cognition  Alert;Cooperative;Pleasant mood      Pain Assessment   Pain Assessment  No/denies pain      Cognitive-Linquistic Treatment   Treatment focused on  Cognition    Skilled Treatment  Father: "This therapy is as much for me as it is her." Father reports letting pt sit to watch TV as he cares for household. SLP encouraged father to try to give pt more things to do throughout the day - SLP directed attention to cognitive tasks given in [place] visit by Mickel Baas. Respite care may be a good option for father, when safely allowed due to COVID-19 safety measures. Pt req'd mod A for date/day with calendar in front of her and days marked off until current day. SLP noted pt's  eyes attentive to SLP (eye contact) for approx 45-60 seconds max in conversation with SLP, then darting around room until SLP cued pt back to appropriate eye contact. Nothing looked like it had been done in notebook since last ST session. Father stated it is difficult to get pt to complete PT exercises; SLP suggested a spreadsheet with exercises and place for two checkmarks per day so pt could see visually she has more to do than just once/day exercises. SLP told father to re-work pt schedule with a more practical wake up time for pt ("She doens't want to get up before 8:30-9").      Assessment / Recommendations / Plan   Plan  Continue with current plan of care      Progression Toward  Goals   Progression toward goals  Progressing toward goals       SLP Education - 06/11/18 1506    Education Details  need to do a cognitive task every day (at least one, not always solitaire), home tasks    Person(s) Educated  Patient;Parent(s)    Methods  Explanation    Comprehension  Verbalized understanding       SLP Short Term Goals - 06/11/18 1152      SLP SHORT TERM GOAL #1   Title  pt will demo sustained attention to complete 4 7-minute cognitive linguistic tasks, for improved participation in therapy over 3 sessions    Baseline  05/18/18; 05/25/18; 06/01/18    Time  --    Period  --    Status  Achieved   or 9 total visits, for all STGs     SLP SHORT TERM GOAL #2   Title  pt will demo incr'd awareness by telling SLP 3 things that are more difficult now than prior to CVA    Time  1    Period  --    Status  Not Met      SLP SHORT TERM GOAL #3   Title  pt will use a Child psychotherapist with occasional min A to do so    Time  1    Period  --    Status  Not Met       SLP Long Term Goals - 06/11/18 1508      SLP LONG TERM GOAL #1   Title  pt will demo sustained/selective attention to complete 3 12-minute cognitive linguistic tasks, for improved participation in therapy over 3 sessions    Time  5    Period  Weeks   or 17 sessions, for all LTGs   Status  On-going      SLP LONG TERM GOAL #2   Title  pt's father/family will report pt asking about schedule less than prior to ST    Time  5    Period  Weeks    Status  On-going      SLP LONG TERM GOAL #3   Title  pt will demo use of memory notebook/planner for homework, schedule verification, etc in 8 therapy sessions    Time  5    Period  Weeks    Status  On-going       Plan - 06/11/18 1507    Clinical Impression Statement  Pt continues to present with moderate impairment in attention, memory, and awareness. Compensations for carryover of PT/OT HEP as well as maintaining a daily chore/activity were  reiterated today to father and to pt. Father indicates pt has little motivation except to watch TV  and play solitaire at this time. Continue skilled ST to maximize cognition and carryover of compensations for cognition for safety and independence. cognitive eval deferred as pt has not brought in her phone with speech to text  - I am communicating with her by typing and writing which would invalidate portions of cognitive evaluation. Pt's father is to attend next session with pt's phone., to assist pt in following up with compensations for cognition at home. Pt will be on hold for 1-2 weeks due to sinus surgery.     Speech Therapy Frequency  2x / week    Duration  --   8 weeks or 17 visits   Treatment/Interventions  Cueing hierarchy;SLP instruction and feedback;Compensatory strategies;Functional tasks;Cognitive reorganization;Internal/external aids;Patient/family education    Potential to Achieve Goals  Good    Potential Considerations  Ability to learn/carryover information;Co-morbidities;Severity of impairments    Consulted and Agree with Plan of Care  Patient;Family member/caregiver    Family Member Consulted  father       Patient will benefit from skilled therapeutic intervention in order to improve the following deficits and impairments:   Cognitive communication deficit    Problem List Patient Active Problem List   Diagnosis Date Noted  . Sinusitis 03/03/2018  . Right thalamic infarction ([place]) 02/03/2018  . Cerebrovascular accident (CVA) ([place])   . Benign essential HTN   . Tachypnea   . Sensorineural hearing loss (SNHL) of both ears 01/31/2018  . Dyslipidemia 01/31/2018  . Acute ischemic stroke ([place]) 01/30/2018  . Pneumococcal bacteremia   . Pneumococcal meningitis   . Turner syndrome   . Hypothyroidism 01/15/2018  . Generalized anxiety disorder 01/15/2018  . Bicuspid aortic valve 04/06/2013  . HTN (hypertension) 04/06/2013    [facility] ,[place], [place]  06/11/2018, 3:10  PM  [facility] [address] [place], [place], [address] Phone: [phone]   Fax:  [phone]   Name: Emma Martin MRN: [id number] Date of Birth: 03/15/1980

## 2018-06-12 DIAGNOSIS — R7881 Bacteremia: Secondary | ICD-10-CM | POA: Diagnosis not present

## 2018-06-12 DIAGNOSIS — G039 Meningitis, unspecified: Secondary | ICD-10-CM | POA: Diagnosis not present

## 2018-06-12 DIAGNOSIS — I639 Cerebral infarction, unspecified: Secondary | ICD-10-CM | POA: Diagnosis not present

## 2018-06-12 DIAGNOSIS — G001 Pneumococcal meningitis: Secondary | ICD-10-CM | POA: Diagnosis not present

## 2018-06-16 ENCOUNTER — Encounter: Payer: Medicaid Other | Admitting: Occupational Therapy

## 2018-06-16 ENCOUNTER — Ambulatory Visit: Payer: Medicaid Other | Admitting: Physical Therapy

## 2018-06-16 DIAGNOSIS — Z9889 Other specified postprocedural states: Secondary | ICD-10-CM | POA: Diagnosis not present

## 2018-06-16 DIAGNOSIS — H903 Sensorineural hearing loss, bilateral: Secondary | ICD-10-CM | POA: Diagnosis not present

## 2018-06-16 DIAGNOSIS — Z8619 Personal history of other infectious and parasitic diseases: Secondary | ICD-10-CM | POA: Diagnosis not present

## 2018-06-16 DIAGNOSIS — J323 Chronic sphenoidal sinusitis: Secondary | ICD-10-CM | POA: Diagnosis not present

## 2018-06-16 DIAGNOSIS — J322 Chronic ethmoidal sinusitis: Secondary | ICD-10-CM | POA: Diagnosis not present

## 2018-06-18 ENCOUNTER — Ambulatory Visit: Payer: BC Managed Care – PPO | Admitting: Physical Therapy

## 2018-06-18 ENCOUNTER — Other Ambulatory Visit: Payer: Self-pay

## 2018-06-18 ENCOUNTER — Ambulatory Visit: Payer: BC Managed Care – PPO

## 2018-06-18 ENCOUNTER — Ambulatory Visit: Payer: BC Managed Care – PPO | Admitting: Occupational Therapy

## 2018-06-18 DIAGNOSIS — R41844 Frontal lobe and executive function deficit: Secondary | ICD-10-CM

## 2018-06-18 DIAGNOSIS — M6281 Muscle weakness (generalized): Secondary | ICD-10-CM

## 2018-06-18 DIAGNOSIS — R41841 Cognitive communication deficit: Secondary | ICD-10-CM

## 2018-06-18 DIAGNOSIS — R278 Other lack of coordination: Secondary | ICD-10-CM | POA: Diagnosis not present

## 2018-06-18 DIAGNOSIS — R41842 Visuospatial deficit: Secondary | ICD-10-CM | POA: Diagnosis not present

## 2018-06-18 DIAGNOSIS — R41840 Attention and concentration deficit: Secondary | ICD-10-CM | POA: Diagnosis not present

## 2018-06-18 DIAGNOSIS — I69315 Cognitive social or emotional deficit following cerebral infarction: Secondary | ICD-10-CM | POA: Diagnosis not present

## 2018-06-18 DIAGNOSIS — R2681 Unsteadiness on feet: Secondary | ICD-10-CM | POA: Diagnosis not present

## 2018-06-18 DIAGNOSIS — R2689 Other abnormalities of gait and mobility: Secondary | ICD-10-CM

## 2018-06-18 NOTE — Therapy (Signed)
[facility] [address] [place], [place], [address] Phone: [phone]   Fax:  [phone]  Physical Therapy Treatment  Patient Details  Name: Emma Martin MRN: [id number] Date of Birth: 20-Jan-1981 Referring Provider (PT): Maurice Small, MD   Encounter Date: 06/18/2018  CLINIC OPERATION CHANGES: [facility] is operating at a low capacity due to COVID-19.  The patient was brought into the clinic for evaluation and/or treatment following universal masking by staff, social distancing, and <10 people in the clinic.  The patient's COVID risk of complications score is 2.   PT End of Session - 06/18/18 1709    Visit Number  9    Number of Visits  17   per recert 4/[id number]/[id number]/EL for 8 weeks   Date for PT Re-Evaluation  09/16/18    Authorization Type  BCBS, Medicaid (4th, 5th visits are telehealth); medicaid approval 10PT visits 05/26/2018-08/03/2018    Authorization Time Period  05/26/2018-08/03/2018    Authorization - Visit Number  2    Authorization - Number of Visits  10   Medicaid   PT Start Time  9532    PT Stop Time  1402    PT Time Calculation (min)  46 min    Activity Tolerance  Patient tolerated treatment well    Behavior During Therapy  WFL for tasks assessed/performed       Past Medical History:  Diagnosis Date  . Bicuspid aortic valve    , mild aortic insufficiency. (No SBE prophylaxis needed)  . Complication of anesthesia   . Dyslipidemia   . Generalized anxiety disorder    , with obsessive-compulsive traits.  . Hypertension   . Hypothyroidism   . Mixed gonadal dysgenesis    , (additional Y-bearing cell line) for which she reportedly underwent bilateral gonadectomy (patient unaware; needs confirmation) due to malignancy potential.  . PONV (postoperative nausea and vomiting)   . Primary amenorrhea    , treated starting at age 26 or 40 with estrogen and progesterone  . Scoliosis     , treated with Harrington rods (1997) for stabilization.  . Seasonal allergic rhinitis   . Short stature    , previously treated with growth hormone age 65-15 years.  Radford Pax syndrome    , previously followed by pediatric endocrinologist Freddy Jaksch, MD at [facility] through her early 80s).    Past Surgical History:  Procedure Laterality Date  . TEE WITHOUT CARDIOVERSION N/A 01/19/2018   Procedure: TRANSESOPHAGEAL ECHOCARDIOGRAM (TEE);  Surgeon: Jerline Pain, MD;  Location: [facility] ENDOSCOPY;  Service: Cardiovascular;  Laterality: N/A;  . TEE WITHOUT CARDIOVERSION N/A 02/02/2018   Procedure: TRANSESOPHAGEAL ECHOCARDIOGRAM (TEE);  Surgeon: Elouise Munroe, MD;  Location: [facility];  Service: Cardiology;  Laterality: N/A;    There were no vitals filed for this visit.  Subjective Assessment - 06/18/18 1701    Subjective  No falls, pt's dad reports she is not really doing the exercises at home.  She had the nasal surgery and has no restrictions. (Will have to wait 8-10 weeks until cochlear implant)    Patient is accompained by:  Family member   Dad   Patient Stated Goals  Want to feel stronger moving around, to not depend on walker.    Currently in Pain?  No/denies    Pain Onset  Today                       [facility]  Adult PT Treatment/Exercise - 06/18/18 0001      Transfers   Transfers  Sit to Stand;Stand to Sit    Sit to Stand  6: Modified independent (Device/Increase time);Without upper extremity assist;From chair/3-in-1;From bed    Five time sit to stand comments   15.19   10.97 with cues   Stand to Sit  6: Modified independent (Device/Increase time);To chair/3-in-1;To bed;Without upper extremity assist      Ambulation/Gait   Ambulation/Gait  Yes    Ambulation/Gait Assistance  5: Supervision    Ambulation Distance (Feet)  200 Feet    Assistive device  None;Straight cane    Gait Pattern  Step-through pattern;Decreased stride length;Wide base of  support;Decreased arm swing - right;Decreased arm swing - left;Decreased step length - left;Decreased step length - right    Ambulation Surface  Level;Indoor    Stairs  Yes    Stair Management Technique  One rail Right;Step to pattern;Alternating pattern;Forwards   Pt is able to perform alternating pattern with cues   Number of Stairs  4    Height of Stairs  6      Standardized Balance Assessment   Standardized Balance Assessment  Timed Up and Go Test;Dynamic Gait Index      Berg Balance Test   Sit to Stand  Able to stand without using hands and stabilize independently    Standing Unsupported  Able to stand safely 2 minutes    Sitting with Back Unsupported but Feet Supported on Floor or Stool  Able to sit safely and securely 2 minutes    Stand to Sit  Sits safely with minimal use of hands    Transfers  Able to transfer safely, minor use of hands    Standing Unsupported with Eyes Closed  Able to stand 10 seconds safely    Standing Ubsupported with Feet Together  Able to place feet together independently and stand 1 minute safely    From Standing, Reach Forward with Outstretched Arm  Can reach forward >12 cm safely (5")    From Standing Position, Pick up Object from Floor  Able to pick up shoe, needs supervision    From Standing Position, Turn to Look Behind Over each Shoulder  Looks behind from both sides and weight shifts well    Turn 360 Degrees  Able to turn 360 degrees safely in 4 seconds or less    Standing Unsupported, Alternately Place Feet on Step/Stool  Able to complete >2 steps/needs minimal assist    Standing Unsupported, One Foot in Front  Able to plae foot ahead of the other independently and hold 30 seconds    Standing on One Leg  Tries to lift leg/unable to hold 3 seconds but remains standing independently    Total Score  47      Dynamic Gait Index   Level Surface  Moderate Impairment    Change in Gait Speed  Mild Impairment    Gait with Horizontal Head Turns  Moderate  Impairment    Gait with Vertical Head Turns  Mild Impairment    Gait and Pivot Turn  Normal    Step Over Obstacle  Moderate Impairment    Step Around Obstacles  Mild Impairment    Steps  Moderate Impairment    Total Score  13      Timed Up and Go Test   TUG  Normal TUG    Normal TUG (seconds)  15.05      High Level Balance   High  Level Balance Comments  Single limb stance RLE and LLE2 reps each with minimal UE support x 10 sec      Knee/Hip Exercises: Standing   Heel Raises  Left;1 set;10 reps    Functional Squat  1 set;10 reps   2nd set x 5 reps-deep squat to touch floor, 1 UE support          Discussed POC, and pt/family agrees to 1x/wk for 8 weeks- to continue to address gait and balance, functional activities within the home  PT Education - 06/18/18 1707    Education Details  Fall prevention education, progress towards goals, updated POC    Person(s) Educated  Patient;Spouse    Methods  Explanation;Handout    Comprehension  Verbalized understanding       PT Short Term Goals - 06/18/18 1715      PT SHORT TERM GOAL #1   Title  Pt will perform HEP with supervision, for improved balance, strength and gait.  TARGET 06/11/2018    Baseline  needs father's cues; 5/29:  dad reports pt not consistently doing HEP    Time  4    Period  Weeks    Status  Not Met    Target Date  06/11/18      PT SHORT TERM GOAL #2   Title  Pt will improve 5x sit<>stand to less than or equal to 13 seconds to demonstrate improved functional strength.    Baseline  15.19 sec 06/18/2018    Time  4    Period  Weeks    Status  Not Met    Target Date  06/11/18      PT SHORT TERM GOAL #3   Title  Pt will improve Berg Balance score to at least 43/56 for decreased fall risk.    Baseline  47/56 06/18/2018    Time  4    Period  Weeks    Status  Achieved      PT SHORT TERM GOAL #4   Title  Pt will improve TUG score to less than or equal to 13.5 seconds for decreased fall risk.    Baseline   06/18/2018 15.05 sec    Time  4    Period  Weeks    Status  Not Met    Target Date  06/11/18      PT SHORT TERM GOAL #5   Title  Pt will ambulate at least 50-100 ft, with cane/no assistive device, supervision, for improved household mobility.    Baseline  no device household gait    Time  4    Period  Weeks    Status  Achieved    Target Date  06/11/18      PT SHORT TERM GOAL #6   Title  Pt/family will verbalize understanding of fall prevention in home environment.    Baseline  Fall risk per TUG, gait velocity, Berg    Time  4    Period  Weeks    Status  Achieved    Target Date  06/11/18        PT Long Term Goals - 04/02/18 1632      PT LONG TERM GOAL #1   Title  Pt will perform progressive HEP for improved strength, balance, gait.  TARGET 06/25/2018    Baseline  no current HEP    Time  12    Period  Weeks    Status  New    Target Date  06/25/18  PT LONG TERM GOAL #2   Title  Pt will improve 5x sit<>stand to less than or equal to 10 seconds for improved functional strength/transfer efficiency.    Baseline  5x:  16 sec at eval    Time  12    Period  Weeks    Status  New    Target Date  06/25/18      PT LONG TERM GOAL #3   Title  Pt will improve TUG score to less than or equal to 13.5 seconds for decreased fall risk.    Baseline  TUG 25.25 at eval    Time  12    Period  Weeks    Status  New    Target Date  06/25/18      PT LONG TERM GOAL #4   Title  Pt will improve Berg Balance score to at least 48/56 for decreased fall risk.    Baseline  Berg 38/56 at eval    Time  12    Period  Weeks    Status  New    Target Date  06/25/18      PT LONG TERM GOAL #5   Title  Pt will ambulate independently, 1000 ft, indoor and outdoor surfaces, no LOB for improved community gait.    Baseline  150 ft Supervision RW    Time  12    Period  Weeks    Status  New    Target Date  06/25/18            Plan - 06/18/18 1718    Clinical Impression Statement  Assessed STGs  this visit, (approx 8 week goal check, and will renew today to extend POC); STG 1 and 2 not met for 5x sit<>stand, STG 3 Berg score met (improved to 47/56), STG 4 not met for TUG'; STG 5 and 6.  Pt has met 3 of 6 STGs.  She continues to have difficulty with SLS and lateral hip stability with gait. Pt is making steady, slow progress, but according to dad, she is not consistently doing exercises and home and mostly spends day watching TV.  Discussed encouraging pt to walk in home without cane, stair negotiation with step-over pattern, and that next visit we will work to revise HEP for more fucntional/household activities.  Pt will continue to beneift from skilled PT to further address strength, balance, and gait training without cane.    Personal Factors and Comorbidities  Comorbidity 3+    Comorbidities  Turner syndrome, scoliosis with Harrington rod placement, pneumococcal meningitis and profound hearing loss    Examination-Activity Limitations  Stand;Transfers;Locomotion Level    Examination-Participation Restrictions  Community Activity    Stability/Clinical Decision Making  Evolving/Moderate complexity    Rehab Potential  Good    PT Frequency  1x / week    PT Duration  8 weeks   per renewal 06/18/2018   PT Treatment/Interventions  ADLs/Self Care Home Management;DME Instruction;Gait training;Functional mobility training;Stair training;Therapeutic activities;Therapeutic exercise;Patient/family education;Neuromuscular re-education;Balance training    PT Next Visit Plan  Pretty much d/c pt's HEP and start again (too many exercises and she is not doing):  SLS, squats, sit to stand, walking up and down steps, weightshifting/functional reaching; pt has leg weights at home    PT [place] and Agree with Plan of Care  Patient    Family Member Consulted          Patient will benefit from skilled therapeutic  intervention in order to improve the following deficits and  impairments:  Abnormal gait, Decreased balance, Decreased mobility, Decreased strength, Difficulty walking, Postural dysfunction  Visit Diagnosis: Unsteadiness on feet  Muscle weakness (generalized)  Other abnormalities of gait and mobility     Problem List Patient Active Problem List   Diagnosis Date Noted  . Sinusitis 03/03/2018  . 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 Cerebrovascular accident (CVA) ([place])   . Benign essential HTN   . Tachypnea   . Sensorineural hearing loss (SNHL) of both ears 01/31/2018  . Dyslipidemia 01/31/2018  . Acute ischemic stroke ([place]) 01/30/2018  . Pneumococcal bacteremia   . Pneumococcal meningitis   . Turner syndrome   . Hypothyroidism 01/15/2018  . Generalized anxiety disorder 01/15/2018  . Bicuspid aortic valve 04/06/2013  . HTN (hypertension) 04/06/2013    Lamaj Metoyer W. 06/18/2018, 5:27 PM  Andrea Colglazier Gerrit Friends, PT 06/18/18 5:29 PM Phone: [phone] Fax: [facility] [address] [place], [place], [address] Phone: [phone]   Fax:  [phone]  Name: Emma Martin MRN: [id number] Date of Birth: 1980/02/11   Updated goals per renewal 06/18/2018  PT Short Term Goals - 06/18/18 1729      PT SHORT TERM GOAL #1   Title  Pt will perform HEP with supervision, including household tasks and mobility, for improved balance, strength and gait.  TARGET 07/16/2018    Baseline       Time  4    Period  Weeks    Status  Revised    Target Date  07/16/18      PT SHORT TERM GOAL #2   Title  Pt will improve 5x sit<>stand independently, to less than or equal to 13 seconds to demonstrate improved functional strength.    Baseline  15.19 sec 06/18/2018 (with cues 10.9 sec)    Time  4    Period  Weeks    Status  Revised    Target Date  07/16/18      PT SHORT TERM GOAL #3   Title  Pt will improve DGI to at least 16/24 for decreased fall risk.    Baseline        Time  4    Period  Weeks    Status  Revised    Target Date  07/16/18      PT SHORT TERM GOAL #4   Title  Pt will improve TUG score to less than or equal to 13.5 seconds for decreased fall risk.    Baseline  06/18/2018 15.05 sec    Time  4    Period  Weeks    Status  On-going    Target Date  07/16/18      PT SHORT TERM GOAL #5   Title  Pt will negotiate at least 4 steps, modified independently, alternating step pattern for improved functional mobility in home.    Baseline  prefers step-to pattern    Time  4    Period  Weeks    Status  Revised    Target Date  07/16/18      PT SHORT TERM GOAL #6   Title       Baseline         PT Long Term Goals - 06/18/18 1732      PT LONG TERM GOAL #1   Title  Pt will verbalize plans for continued community fitness post d/c from PT to maximize gains made in  PT.  TARGET 08/13/2018    Baseline       Time  8    Period  Weeks    Status  Revised    Target Date  08/13/18      PT LONG TERM GOAL #2   Title  Pt will perform squats to pick up objects from floor, 4 of 5 trials with no UE support, to demo improved functional strength and balance.    Baseline       Time  8    Period  Weeks    Status  New    Target Date  08/13/18      PT LONG TERM GOAL #3   Title  Pt will perform SLS at least 3 sec each leg for improved obstacle and stair negotiation.    Baseline  <1 sec 06/18/2018    Time  8    Period  Weeks    Status  Revised    Target Date  08/13/18      PT LONG TERM GOAL #4   Title  Pt will improve DGI score to at least 20/24 for decreased fall risk.    Baseline  13/24 06/18/2018    Time  8    Period  Weeks    Status  New    Target Date  08/13/18      PT LONG TERM GOAL #5   Title  Pt will ambulate independently, 1000 ft, indoor and outdoor surfaces, no LOB for improved community gait.    Baseline  household distances no device 06/18/2018    Time  8    Period  Weeks    Status  On-going    Target Date  08/13/18     Mady Haagensen,  PT 06/18/18 5:36 PM Phone: [phone] Fax: [phone]

## 2018-06-18 NOTE — Patient Instructions (Signed)

## 2018-06-18 NOTE — Therapy (Signed)
[facility] [address] [place], [place], [address] Phone: [phone]   Fax:  [phone]  Speech Language Pathology Treatment  Patient Details  Name: Emma Martin MRN: [id number] Date of Birth: June 27, 1980 Referring Provider (SLP): Maurice Small, MD   Encounter Date: 06/18/2018  End of Session - 06/18/18 1716    Visit Number  7    Number of Visits  17    Date for SLP Re-Evaluation  06/25/18    Authorization Type  end date 08-04-18 medicaid    Authorization - Visit Number  6    Authorization - Number of Visits  8    SLP Start Time  1740    SLP Stop Time   1534    SLP Time Calculation (min)  43 min    Activity Tolerance  Patient tolerated treatment well       Past Medical History:  Diagnosis Date  . Bicuspid aortic valve    , mild aortic insufficiency. (No SBE prophylaxis needed)  . Complication of anesthesia   . Dyslipidemia   . Generalized anxiety disorder    , with obsessive-compulsive traits.  . Hypertension   . Hypothyroidism   . Mixed gonadal dysgenesis    , (additional Y-bearing cell line) for which she reportedly underwent bilateral gonadectomy (patient unaware; needs confirmation) due to malignancy potential.  . PONV (postoperative nausea and vomiting)   . Primary amenorrhea    , treated starting at age 83 or 9 with estrogen and progesterone  . Scoliosis    , treated with Harrington rods (1997) for stabilization.  . Seasonal allergic rhinitis   . Short stature    , previously treated with growth hormone age 80-15 years.  Radford Pax syndrome    , previously followed by pediatric endocrinologist Freddy Jaksch, MD at [facility] through her early 82s).    Past Surgical History:  Procedure Laterality Date  . TEE WITHOUT CARDIOVERSION N/A 01/19/2018   Procedure: TRANSESOPHAGEAL ECHOCARDIOGRAM (TEE);  Surgeon: Jerline Pain, MD;  Location: [facility] ENDOSCOPY;  Service: Cardiovascular;  Laterality:  N/A;  . TEE WITHOUT CARDIOVERSION N/A 02/02/2018   Procedure: TRANSESOPHAGEAL ECHOCARDIOGRAM (TEE);  Surgeon: Elouise Munroe, MD;  Location: [facility];  Service: Cardiology;  Laterality: N/A;    There were no vitals filed for this visit.  Subjective Assessment - 06/18/18 1455    Subjective  Pt arrives with ather from OT at 1451.    Patient is accompained by:  Family member   father   Currently in Pain?  No/denies            ADULT SLP TREATMENT - 06/18/18 1455      General Information   Behavior/Cognition  Alert;Cooperative;Pleasant mood      Pain Assessment   Pain Assessment  No/denies pain      Cognitive-Linquistic Treatment   Treatment focused on  Cognition    Skilled Treatment  "Not a huge amount (of things done for pt's thinking skills since last session)." Father reports he has encouraged pt to complete therapy ativities but pt "does a few and then she stops and won't do any more." SLP had frank conversation with father about pt doing something for at least 45 minutes/day for her cognition. SLP strongly suggested father and pt write a daily schedule for pt so that there would be a time allotted for her to accomplish necessary therapy tasks at home as well as other beneficial things instead of TV and solitaire. Pt  completed functional alternating attention task (filling in appointments ona calendar) without emergent awareness of errors, and with decr'd alernating attention (skipped one stimulus, and began reviewing stimuli with SLP she had already reviewed - without error awarenes).       Assessment / Recommendations / Plan   Plan  Other (Comment)   continue x1/week     Progression Toward Goals   Progression toward goals  Not progressing toward goals (comment)   due to decr'd motivation and severity of deficit      SLP Education - 06/18/18 1715    Education Details  cognitive task needs to be done every day, benefits of a daily schedule, pt motivation/work at home  will need to increase    Person(s) Educated  Patient;Parent(s)    Methods  Explanation    Comprehension  Verbalized understanding       SLP Short Term Goals - 06/11/18 1152      SLP SHORT TERM GOAL #1   Title  pt will demo sustained attention to complete 4 7-minute cognitive linguistic tasks, for improved participation in therapy over 3 sessions    Baseline  05/18/18; 05/25/18; 06/01/18    Time  --    Period  --    Status  Achieved   or 9 total visits, for all STGs     SLP SHORT TERM GOAL #2   Title  pt will demo incr'd awareness by telling SLP 3 things that are more difficult now than prior to CVA    Time  1    Period  --    Status  Not Met      SLP SHORT TERM GOAL #3   Title  pt will use a Child psychotherapist with occasional min A to do so    Time  1    Period  --    Status  Not Met       SLP Long Term Goals - 06/18/18 1719      SLP LONG TERM GOAL #1   Title  pt will demo sustained/selective attention to complete 3 12-minute cognitive linguistic tasks, for improved participation in therapy over 3 sessions    Time  4    Period  Weeks   or 17 sessions, for all LTGs   Status  On-going      SLP LONG TERM GOAL #2   Title  pt's father/family will report pt asking about schedule less than prior to ST    Time  4    Period  Weeks    Status  On-going      SLP LONG TERM GOAL #3   Title  pt will demo use of memory notebook/planner for homework, schedule verification, etc in 8 therapy sessions    Time  4    Period  Weeks    Status  On-going       Plan - 06/18/18 1505    Clinical Impression Statement  Pt continues to present with moderate impairment in attention, memory, and awareness. Pt's motivation for change at this time is reported by father as low, as pt does little to work on cognition at home other than solitaire.   Compensations for carryover of PT/OT HEP as well as maintaining a daily chore/activity were reiterated today to father and to pt. Father  indicates pt continues to have little motivation except to watch TV and play solitaire. SLP told father to develop a dailiy schedule with pt in order to bring some order and structure  to pt's day so she can complete tasks which will improve her cognitive and physcial condition/s. Continue skilled ST at least for next 3 weeks to maximize cognition and carryover of compensations for cognition for safety and independence. If pt motivation and participation in cognitive tasks at home does not improve in approx 3 more sessions, d/c may be appropriate.     Speech Therapy Frequency  2x / week    Duration  --   8 weeks or 17 visits   Treatment/Interventions  Cueing hierarchy;SLP instruction and feedback;Compensatory strategies;Functional tasks;Cognitive reorganization;Internal/external aids;Patient/family education    Potential to Achieve Goals  Good    Potential Considerations  Ability to learn/carryover information;Co-morbidities;Severity of impairments    Consulted and Agree with Plan of Care  Patient;Family member/caregiver    Family Member Consulted  father       Patient will benefit from skilled therapeutic intervention in order to improve the following deficits and impairments:   Cognitive communication deficit    Problem List Patient Active Problem List   Diagnosis Date Noted  . Sinusitis 03/03/2018  . Right thalamic infarction ([place]) 02/03/2018  . Cerebrovascular accident (CVA) ([place])   . Benign essential HTN   . Tachypnea   . Sensorineural hearing loss (SNHL) of both ears 01/31/2018  . Dyslipidemia 01/31/2018  . Acute ischemic stroke ([place]) 01/30/2018  . Pneumococcal bacteremia   . Pneumococcal meningitis   . Turner syndrome   . Hypothyroidism 01/15/2018  . Generalized anxiety disorder 01/15/2018  . Bicuspid aortic valve 04/06/2013  . HTN (hypertension) 04/06/2013    [facility] ,MS, [place]  06/18/2018, 5:22 PM  [facility] [address] [place], [place], [address] Phone: [phone]   Fax:  [phone]   Name: Emma Martin MRN: [id number] Date of Birth: 05-02-1980

## 2018-06-18 NOTE — Patient Instructions (Signed)
Make a daily schedule this week, and bring it to next session.  Work for no longer than 15 minutes at a time with [place] on this.

## 2018-06-18 NOTE — Therapy (Signed)
[facility] [address] [place], [place], [address] Phone: [phone]   Fax:  [phone]  Occupational Therapy Treatment  Patient Details  Name: Emma Martin MRN: [id number] Date of Birth: [birth year] Referring Provider (OT): Maurice Small, MD   Encounter Date: 06/18/2018  OT End of Session - 06/18/18 1506    Visit Number  6    Number of Visits  25    Date for OT Re-Evaluation  07/01/18    Authorization Type  BCBS / Medicaid    Authorization Time Period  BCBS--60 VL combined PT/OT counts as 2 visits if seen on same day.  Medicaid 3 visits approved 05/25/18-06/14/18 1 visit approved 06/2018    Authorization - Visit Number  3    Authorization - Number of Visits  3    OT Start Time  7169    OT Stop Time  6789    OT Time Calculation (min)  41 min       Past Medical History:  Diagnosis Date  . Bicuspid aortic valve    , mild aortic insufficiency. (No SBE prophylaxis needed)  . Complication of anesthesia   . Dyslipidemia   . Generalized anxiety disorder    , with obsessive-compulsive traits.  . Hypertension   . Hypothyroidism   . Mixed gonadal dysgenesis    , (additional Y-bearing cell line) for which she reportedly underwent bilateral gonadectomy (patient unaware; needs confirmation) due to malignancy potential.  . PONV (postoperative nausea and vomiting)   . Primary amenorrhea    , treated starting at age 89 or 18 with estrogen and progesterone  . Scoliosis    , treated with Harrington rods (1997) for stabilization.  . Seasonal allergic rhinitis   . Short stature    , previously treated with growth hormone age 28-15 years.  Radford Pax syndrome    , previously followed by pediatric endocrinologist Freddy Jaksch, MD at [facility] through her early 51s).    Past Surgical History:  Procedure Laterality Date  . TEE WITHOUT CARDIOVERSION N/A 01/19/2018   Procedure: TRANSESOPHAGEAL ECHOCARDIOGRAM (TEE);   Surgeon: Jerline Pain, MD;  Location: [facility] ENDOSCOPY;  Service: Cardiovascular;  Laterality: N/A;  . TEE WITHOUT CARDIOVERSION N/A 02/02/2018   Procedure: TRANSESOPHAGEAL ECHOCARDIOGRAM (TEE);  Surgeon: Elouise Munroe, MD;  Location: [facility];  Service: Cardiology;  Laterality: N/A;    There were no vitals filed for this visit.  Subjective Assessment - 06/18/18 1505    Subjective   Pt denies pain    Pertinent History  meningitis, CVA 01/30/2018    Currently in Pain?  No/denies             CLINIC OPERATION CHANGES: [facility] is operating at a low capacity due to COVID-19.  The patient was brought into the clinic for evaluation and/or treatment following universal masking by staff, social distancing, and <10 people in the clinic.  The patient's COVID risk of complications score is 2  Treatment: Therapist checked progress towards goals as pt's father was present. Pt is currently not consistently performing home management and she requires prompting for any HEP. See goals for progress. Therapist communicated with patient with written instructions due to hearing loss. Strengthening exercises performing shoulder flexion, biceps curls and shoulder abduction hold  1 lbs weight in each hand, min written cues due to hearing loss. Gripper set at level 2 to pick up 1 inch blocks with left and right UE's, several rest breaks required  due to fatigue. Ambulating while performing environmental scanning to locate items in sequential order 90% accuracy on 1st pass, no LOB                 OT Short Term Goals - 06/18/18 1401      OT SHORT TERM GOAL #1   Title  I with initial HEP    Baseline  needs reminders to perfrom and min v.c    Time  6    Period  Weeks    Status  On-going   05/25/18:  not currently performing, ST provided pt with exercise checklist and instructed in use     OT SHORT TERM GOAL #2   Title  Pt will perform all basic ADLS modified  independently    Baseline  distant supervision    Time  6    Period  Weeks    Status  On-going   06/18/18 pt reports continued distant supervision     OT SHORT TERM GOAL #3   Title  Pt will perform cold meal prep modified indpendently and basic cooking task with min A    Baseline  Pt is able to perfrom cold sandwich prep and reheat in microwave, pt has not attempted use of stove yet    Time  6    Period  Weeks    Status  Partially Met   makes a sandwich     OT SHORT TERM GOAL #4   Title  Pt will perform basic home management modified I    Baseline  currently pt is only folding laundry, dependent with other home management    Time  6    Period  Weeks    Status  On-going   05/25/18:  only folding clothes     OT SHORT TERM GOAL #5   Title  Pt will demonstrate improved fine motor coordination as evidenced by decreasing 9 hole peg test by 5 secs for LUE.    Baseline  27.34-RUE, 36.75 -LUE    Time  6    Period  Weeks    Status  Achieved   06/01/18:  R-27.34sec, L-36.75sec     OT SHORT TERM GOAL #6   Title  Further assess visual perceptual skills and set goals prn--see LTGs    Time  6    Period  Weeks    Status  Deferred        OT Long Term Goals - 06/18/18 1512      OT LONG TERM GOAL #1   Title  Pt will perform mod complex home managment modified independently.    Baseline  only folding laundry, dependent for other tasks    Status  On-going      OT LONG TERM GOAL #2   Title  Pt will perform basic cooking with supervision.    Baseline  Pt has only used microwave, she has not attempted stovetop cooking    Status  On-going      OT LONG TERM GOAL #3   Title  Pt will increase bilateral grip strength by 5 lbs for increased functional use during ADLs.    Baseline  RUE 35, LUE 35    Status  On-going      OT LONG TERM GOAL #4   Title  Pt will demonstrate adequate bilateral UE strength and endurance to retrieve a 3 lbs weight from overhead shelf with left and right UE's  individually  without drops x 5 reps in standing modified independently.  Baseline  unable due to decreased UE strength, currently pt is exercising with 1 lb weights    Status  On-going      OT LONG TERM GOAL #5   Title  Pt will demonstrate abilty to sequence a multi step functional or home management task modified independently.    Baseline  Pt requires assist for sequencing/ organization, and v.c to get started/ initiate task    Status  On-going      OT LONG TERM GOAL #6   Title  Pt/ family will verbalize understanding cogntive compensation/ adapted strategies for ADLs/IADLs.    Baseline  needs reinforcement, and further education in strategies    Status  On-going      OT LONG TERM GOAL #7   Title  Pt will perform simple environmental scanning with at least 95% accuracy for improved attention and safety.    Baseline  90%    Status  On-going            Plan - 06/18/18 1507    Clinical Impression Statement  Pt is progressing towards goals with improved coordination and improved environmental scanning. Pt is not fully performing home management activities. Pt needs reinforcement for cognitive strategies and reinforcement of HEP.Pt can benefit from continue skilled OT to maximize pt safety and I with ADLs/ IADLs.    Occupational performance deficits (Please refer to evaluation for details):  ADL's;IADL's;Education;Social Participation;Leisure;Play;Work    Marketing executive / Function / Physical Skills  ADL;Dexterity;Flexibility;Hearing;ROM;Strength;Vision;IADL;FMC;Coordination;Balance;Decreased knowledge of precautions;Endurance;Gait;Pain;Sensation;UE functional use;Mobility;GMC;Fascial restriction;Cardiopulmonary status limiting activity    Cognitive Skills  Attention;Problem Solve;Memory;Safety Awareness;Sequencing;Energy/Drive;Perception    Rehab Potential  Fair    OT Frequency  2x / week    OT Duration  12 weeks    OT Treatment/Interventions  Self-care/ADL  training;Fluidtherapy;Therapeutic exercise;Energy conservation;Gait Training;Functional Furniture conservator/restorer;Therapeutic activities;Visual/perceptual remediation/compensation;Balance training;Patient/family education;Cognitive remediation/compensation;Passive range of motion;Manual Therapy;DME and/or AE instruction;Neuromuscular education;Paraffin;Ultrasound;Moist Heat;Aquatic Therapy    Plan  simple cooking task, functional reaching for 1-2lb object     Consulted and Agree with Plan of Care  Patient    Family Member Consulted  father Karle Starch,        Patient will benefit from skilled therapeutic intervention in order to improve the following deficits and impairments:  Body Structure / Function / Physical Skills, Cognitive Skills  Visit Diagnosis: Attention and concentration deficit  Visuospatial deficit  Frontal lobe and executive function deficit  Muscle weakness (generalized)  Other lack of coordination  Cognitive social or emotional deficit following cerebral infarction  Unsteadiness on feet    Problem List Patient Active Problem List   Diagnosis Date Noted  . Sinusitis 03/03/2018  . Right thalamic infarction ([place]) 02/03/2018  . Cerebrovascular accident (CVA) ([place])   . Benign essential HTN   . Tachypnea   . Sensorineural hearing loss (SNHL) of both ears 01/31/2018  . Dyslipidemia 01/31/2018  . Acute ischemic stroke ([place]) 01/30/2018  . Pneumococcal bacteremia   . Pneumococcal meningitis   . Turner syndrome   . Hypothyroidism 01/15/2018  . Generalized anxiety disorder 01/15/2018  . Bicuspid aortic valve 04/06/2013  . HTN (hypertension) 04/06/2013    Ociel Retherford 06/18/2018, 3:27 PM  [facility] [address] [place], [place], [address] Phone: [phone]   Fax:  [phone]  Name: Emma Martin MRN: [id number] Date of Birth: Jan 05, 1981

## 2018-06-21 DIAGNOSIS — I639 Cerebral infarction, unspecified: Secondary | ICD-10-CM | POA: Diagnosis not present

## 2018-06-23 ENCOUNTER — Ambulatory Visit: Payer: BC Managed Care – PPO | Attending: Family Medicine

## 2018-06-23 ENCOUNTER — Other Ambulatory Visit: Payer: Self-pay

## 2018-06-23 DIAGNOSIS — R278 Other lack of coordination: Secondary | ICD-10-CM | POA: Diagnosis not present

## 2018-06-23 DIAGNOSIS — R41840 Attention and concentration deficit: Secondary | ICD-10-CM | POA: Insufficient documentation

## 2018-06-23 DIAGNOSIS — R2681 Unsteadiness on feet: Secondary | ICD-10-CM | POA: Insufficient documentation

## 2018-06-23 DIAGNOSIS — M6281 Muscle weakness (generalized): Secondary | ICD-10-CM | POA: Diagnosis not present

## 2018-06-23 DIAGNOSIS — R41842 Visuospatial deficit: Secondary | ICD-10-CM | POA: Insufficient documentation

## 2018-06-23 DIAGNOSIS — R2689 Other abnormalities of gait and mobility: Secondary | ICD-10-CM | POA: Insufficient documentation

## 2018-06-23 DIAGNOSIS — R41844 Frontal lobe and executive function deficit: Secondary | ICD-10-CM | POA: Insufficient documentation

## 2018-06-23 DIAGNOSIS — R41841 Cognitive communication deficit: Secondary | ICD-10-CM | POA: Diagnosis not present

## 2018-06-24 NOTE — Patient Instructions (Signed)
  Please complete the assigned speech therapy homework prior to your next session and return it to the speech therapist at your next visit.  

## 2018-06-24 NOTE — Therapy (Signed)
[facility] [address] [place], [place], [address] Phone: [phone]   Fax:  [phone]  Speech Language Pathology Treatment  Patient Details  Name: Emma Martin MRN: [id number] Date of Birth: 17-Jan-1981 Referring Provider (SLP): Maurice Small, MD   Encounter Date: 06/23/2018  End of Session - 06/24/18 1531    Visit Number  8    Number of Visits  17    Date for SLP Re-Evaluation  06/25/18    Authorization Type  end date 08-04-18 medicaid    Authorization - Visit Number  7    Authorization - Number of Visits  8    SLP Start Time  1104    SLP Stop Time   1145    SLP Time Calculation (min)  41 min    Activity Tolerance  Patient tolerated treatment well       Past Medical History:  Diagnosis Date  . Bicuspid aortic valve    , mild aortic insufficiency. (No SBE prophylaxis needed)  . Complication of anesthesia   . Dyslipidemia   . Generalized anxiety disorder    , with obsessive-compulsive traits.  . Hypertension   . Hypothyroidism   . Mixed gonadal dysgenesis    , (additional Y-bearing cell line) for which she reportedly underwent bilateral gonadectomy (patient unaware; needs confirmation) due to malignancy potential.  . PONV (postoperative nausea and vomiting)   . Primary amenorrhea    , treated starting at age 45 or 28 with estrogen and progesterone  . Scoliosis    , treated with Harrington rods (1997) for stabilization.  . Seasonal allergic rhinitis   . Short stature    , previously treated with growth hormone age 39-15 years.  Radford Pax syndrome    , previously followed by pediatric endocrinologist Freddy Jaksch, MD at [facility] through her early 60s).    Past Surgical History:  Procedure Laterality Date  . TEE WITHOUT CARDIOVERSION N/A 01/19/2018   Procedure: TRANSESOPHAGEAL ECHOCARDIOGRAM (TEE);  Surgeon: Jerline Pain, MD;  Location: [facility] ENDOSCOPY;  Service: Cardiovascular;  Laterality:  N/A;  . TEE WITHOUT CARDIOVERSION N/A 02/02/2018   Procedure: TRANSESOPHAGEAL ECHOCARDIOGRAM (TEE);  Surgeon: Elouise Munroe, MD;  Location: [facility];  Service: Cardiology;  Laterality: N/A;    There were no vitals filed for this visit.  Subjective Assessment - 06/23/18 1107    Subjective  Pt father arrives with pt's notebook. Pt's cochlear implant sx has been moved up to 07-14-18. Self-quaratnine for 7 days prior.     Patient is accompained by:  Family member   father   Currently in Pain?  No/denies            ADULT SLP TREATMENT - 06/24/18 0001      General Information   Behavior/Cognition  Alert;Cooperative;Pleasant mood      Pain Assessment   Pain Assessment  No/denies pain      Cognitive-Linquistic Treatment   Treatment focused on  Cognition    Skilled Treatment  Father made the schedule without pt input, despite SLP instructions to work with pt to do so. "I sorta got one (schedule) roughed in. It's a matter of getting both of [place] into the routine of the schedule." SLP asked pt about her schedule (alternating attention in conversation and written information) and pt answered questions 100% success with rare min A back to task. Pt looked out window (without notable distraction outside) x3 for 2-3 seconds and then return to written task wihtout  cues. SLP provided pt functional written task and pt with errors without awareness. Pt did not double check answers. At end of session SLP reviewed pt's schedule with her and told her she needed to complete all therapy tasks in order to improve cognition, UE and LE strength/coodination, and mobility.      Assessment / Recommendations / Plan   Plan  Continue with current plan of care      Progression Toward Goals   Progression toward goals  Not progressing toward goals (comment)   pt cont to perform little at home        SLP Short Term Goals - 06/11/18 1152      SLP SHORT TERM GOAL #1   Title  pt will demo sustained  attention to complete 4 7-minute cognitive linguistic tasks, for improved participation in therapy over 3 sessions    Baseline  05/18/18; 05/25/18; 06/01/18    Time  --    Period  --    Status  Achieved   or 9 total visits, for all STGs     SLP SHORT TERM GOAL #2   Title  pt will demo incr'd awareness by telling SLP 3 things that are more difficult now than prior to CVA    Time  1    Period  --    Status  Not Met      SLP SHORT TERM GOAL #3   Title  pt will use a Child psychotherapist with occasional min A to do so    Time  1    Period  --    Status  Not Met       SLP Long Term Goals - 06/24/18 1536      SLP LONG TERM GOAL #1   Title  pt will demo sustained/selective attention to complete 3 12-minute cognitive linguistic tasks, for improved participation in therapy over 3 sessions    Time  3    Period  Weeks   or 17 sessions, for all LTGs   Status  On-going      SLP LONG TERM GOAL #2   Title  pt's father/family will report pt asking about schedule less than prior to ST    Time  3    Period  Weeks    Status  On-going      SLP LONG TERM GOAL #3   Title  pt will demo use of memory notebook/planner for homework, schedule verification, etc in 8 therapy sessions    Time  3    Period  Weeks    Status  On-going       Plan - 06/24/18 1532    Clinical Impression Statement  Moderate impairment in attention, memory, and awareness persists. Despite SLP's direction to include pt in schedule planning, father did not do so and made schedule himself. Pt's attention and initiation is difficult at this time and father was educated today he will need to provide guidance to assist pt in a task, and to initiate a task. SLP questions father's "buy in" to therapy at this time for whatever reason and it was hoped that today's direct addressing of this with him will result in improved therapeutic interactions at home for and with pt. Today, pt appeared agreeable to the daily schedule  father had made, however SLP and father agreed it may require some modification over the next 1-2 weeks. Continue skilled ST at least for next 2 weeks to maximize cognition and carryover of compensations for cognition  for safety and independence. If pt motivation and participation in cognitive tasks at home does not improve in approx 3 more sessions, d/c may be appropriate.     Speech Therapy Frequency  2x / week    Duration  --   8 weeks or 17 visits   Treatment/Interventions  Cueing hierarchy;SLP instruction and feedback;Compensatory strategies;Functional tasks;Cognitive reorganization;Internal/external aids;Patient/family education    Potential to Achieve Goals  Good    Potential Considerations  Ability to learn/carryover information;Co-morbidities;Severity of impairments    Consulted and Agree with Plan of Care  Patient;Family member/caregiver    Family Member Consulted  father       Patient will benefit from skilled therapeutic intervention in order to improve the following deficits and impairments:   Cognitive communication deficit    Problem List Patient Active Problem List   Diagnosis Date Noted  . Sinusitis 03/03/2018  . Right thalamic infarction ([place]) 02/03/2018  . Cerebrovascular accident (CVA) ([place])   . Benign essential HTN   . Tachypnea   . Sensorineural hearing loss (SNHL) of both ears 01/31/2018  . Dyslipidemia 01/31/2018  . Acute ischemic stroke ([place]) 01/30/2018  . Pneumococcal bacteremia   . Pneumococcal meningitis   . Turner syndrome   . Hypothyroidism 01/15/2018  . Generalized anxiety disorder 01/15/2018  . Bicuspid aortic valve 04/06/2013  . HTN (hypertension) 04/06/2013    [facility] ,[place], [place]  06/24/2018, 3:37 PM  [facility] [address] [place], [place], [address] Phone: [phone]   Fax:  [phone]   Name: Emma Martin MRN: [id number] Date of Birth: December 04, 1980

## 2018-06-29 ENCOUNTER — Other Ambulatory Visit: Payer: Self-pay

## 2018-06-29 ENCOUNTER — Ambulatory Visit (HOSPITAL_COMMUNITY)
Admission: RE | Admit: 2018-06-29 | Discharge: 2018-06-29 | Disposition: A | Discharge status: Home / Self Care | Payer: BC Managed Care – PPO | Source: Ambulatory Visit | Attending: Otolaryngology | Admitting: Otolaryngology

## 2018-06-29 ENCOUNTER — Encounter: Payer: Self-pay | Admitting: Rehabilitative and Restorative Service Providers"

## 2018-06-29 ENCOUNTER — Ambulatory Visit: Payer: BC Managed Care – PPO | Admitting: Rehabilitative and Restorative Service Providers"

## 2018-06-29 DIAGNOSIS — R41844 Frontal lobe and executive function deficit: Secondary | ICD-10-CM | POA: Diagnosis not present

## 2018-06-29 DIAGNOSIS — Z8619 Personal history of other infectious and parasitic diseases: Secondary | ICD-10-CM | POA: Diagnosis not present

## 2018-06-29 DIAGNOSIS — R2681 Unsteadiness on feet: Secondary | ICD-10-CM | POA: Diagnosis not present

## 2018-06-29 DIAGNOSIS — Z01818 Encounter for other preprocedural examination: Secondary | ICD-10-CM | POA: Diagnosis not present

## 2018-06-29 DIAGNOSIS — R278 Other lack of coordination: Secondary | ICD-10-CM | POA: Diagnosis not present

## 2018-06-29 DIAGNOSIS — R2689 Other abnormalities of gait and mobility: Secondary | ICD-10-CM | POA: Diagnosis not present

## 2018-06-29 DIAGNOSIS — R41840 Attention and concentration deficit: Secondary | ICD-10-CM | POA: Diagnosis not present

## 2018-06-29 DIAGNOSIS — R41841 Cognitive communication deficit: Secondary | ICD-10-CM | POA: Diagnosis not present

## 2018-06-29 DIAGNOSIS — M6281 Muscle weakness (generalized): Secondary | ICD-10-CM

## 2018-06-29 DIAGNOSIS — H905 Unspecified sensorineural hearing loss: Secondary | ICD-10-CM | POA: Insufficient documentation

## 2018-06-29 DIAGNOSIS — R41842 Visuospatial deficit: Secondary | ICD-10-CM | POA: Diagnosis not present

## 2018-06-29 MED ORDER — GADOBUTROL 1 MMOL/ML IV SOLN
5.0000 mL | Freq: Once | INTRAVENOUS | Status: AC | PRN
  Administered 2018-06-29: 5 mL via INTRAVENOUS

## 2018-06-29 NOTE — Therapy (Signed)
[facility] [address] [place], [place], [address] Phone: [phone]   Fax:  [phone]  Physical Therapy Treatment  Patient Details  Name: Emma Martin MRN: [id number] Date of Birth: 23-Feb-1980 Referring Provider (PT): Maurice Small, MD   Encounter Date: 06/29/2018  PT End of Session - 06/29/18 0815    Visit Number  10    Number of Visits  17   per recert 10/05/9448-3U/UE for 8 weeks   Date for PT Re-Evaluation  09/16/18    Authorization Type  BCBS, Medicaid (4th, 5th visits are telehealth); medicaid approval 10PT visits 05/26/2018-08/03/2018    Authorization Time Period  05/26/2018-08/03/2018    Authorization - Visit Number  2    Authorization - Number of Visits  10   Medicaid   PT Start Time  0800    PT Stop Time  0845    PT Time Calculation (min)  45 min    Equipment Utilized During Treatment  --   used computer (microsoft word) to type conversation with patient   Activity Tolerance  Patient tolerated treatment well    Behavior During Therapy  [facility] for tasks assessed/performed       Past Medical History:  Diagnosis Date  . Bicuspid aortic valve    , mild aortic insufficiency. (No SBE prophylaxis needed)  . Complication of anesthesia   . Dyslipidemia   . Generalized anxiety disorder    , with obsessive-compulsive traits.  . Hypertension   . Hypothyroidism   . Mixed gonadal dysgenesis    , (additional Y-bearing cell line) for which she reportedly underwent bilateral gonadectomy (patient unaware; needs confirmation) due to malignancy potential.  . PONV (postoperative nausea and vomiting)   . Primary amenorrhea    , treated starting at age 43 or 42 with estrogen and progesterone  . Scoliosis    , treated with Harrington rods (1997) for stabilization.  . Seasonal allergic rhinitis   . Short stature    , previously treated with growth hormone age 71-16 years.  Radford Pax syndrome    , previously followed by  pediatric endocrinologist Freddy Jaksch, MD at [facility] through her early 57s).    Past Surgical History:  Procedure Laterality Date  . TEE WITHOUT CARDIOVERSION N/A 01/19/2018   Procedure: TRANSESOPHAGEAL ECHOCARDIOGRAM (TEE);  Surgeon: Jerline Pain, MD;  Location: [facility] ENDOSCOPY;  Service: Cardiovascular;  Laterality: N/A;  . TEE WITHOUT CARDIOVERSION N/A 02/02/2018   Procedure: TRANSESOPHAGEAL ECHOCARDIOGRAM (TEE);  Surgeon: Elouise Munroe, MD;  Location: [facility];  Service: Cardiology;  Laterality: N/A;    There were no vitals filed for this visit.  Subjective Assessment - 06/29/18 0759    Subjective  No falls, the patient's father reports she is having a hard time getting up from the floor.  He heard back and the patient may now be able to have surgery in late June.  She is going for an MRI today.  This may include canceling some PT visits/ father will let us know.    Patient is accompained by:  Family member   father   Patient Stated Goals  Want to feel stronger moving around, to not depend on walker.       CLINIC OPERATION CHANGES: [facility] is open at lower capacity following universal masking, social distancing, and patient screening.  The patient's COVID risk of complications score is 2.  [facility] Adult PT Treatment/Exercise - 06/29/18 0851      Transfers   Transfers  Sit to Stand;Stand to Sit    Sit to Stand  6: Modified independent (Device/Increase time)    Comments  Began performing for increased speed and appeared easy for patient (switched to wall squats).      Ambulation/Gait   Ambulation/Gait  Yes    Ambulation/Gait Assistance  5: Supervision    Ambulation/Gait Assistance Details  The patient and PT ambulated without SPc in clinic with demo cues for L knee extension and "reaching" to initiate stance phase (working on more emphasis on heel strike).  Performed tactile cues for arm swing during gait.     Ambulation Distance (Feet)  400 Feet    Assistive device  None    Gait Pattern  Step-through pattern;Decreased stride length;Wide base of support;Decreased arm swing - right;Decreased arm swing - left;Decreased step length - left;Decreased step length - right    Gait Comments  The patient when leaving uses SPC without following a pattern and leaning more laterally when using the device.      Therapeutic Activites    Therapeutic Activities  Other Therapeutic Activities    Other Therapeutic Activities  floor<>stand transfer with support through sturdy surface and then without a surface pressing up through the ground.  Mod indep with UE support on surface and SBA pressing up from the floor.      Neuro Re-ed    Neuro Re-ed Details   Single leg stance each leg x 3 reps near countertop adding to HEP, diagonal reaching with wide base of support encouraging trunk rotation; Quadriped bird dog with alternating UE/LE.  Runner's march to encourage exaggerated arm swing, strength and balance x 10 reps.      Exercises   Exercises  Other Exercises    Other Exercises   deep squat, wall slides x 10 reps.             PT Education - 06/29/18 0851    Education Details  Updated HEP with medbridge program. REcommended father be present initially as activities are more challenging.    Person(s) Educated  Patient;Parent(s)    Methods  Explanation;Demonstration;Handout    Comprehension  Verbalized understanding;Returned demonstration;Tactile cues required       PT Short Term Goals - 06/18/18 1729      PT SHORT TERM GOAL #1   Title  Pt will perform HEP with supervision, including household tasks and mobility, for improved balance, strength and gait.  TARGET 07/16/2018    Baseline       Time  4    Period  Weeks    Status  Revised    Target Date  07/16/18      PT SHORT TERM GOAL #2   Title  Pt will improve 5x sit<>stand independently, to less than or equal to 13 seconds to demonstrate improved  functional strength.    Baseline  15.19 sec 06/18/2018 (with cues 10.9 sec)    Time  4    Period  Weeks    Status  Revised    Target Date  07/16/18      PT SHORT TERM GOAL #3   Title  Pt will improve DGI to at least 16/24 for decreased fall risk.    Baseline       Time  4    Period  Weeks    Status  Revised    Target Date  07/16/18  PT SHORT TERM GOAL #4   Title  Pt will improve TUG score to less than or equal to 13.5 seconds for decreased fall risk.    Baseline  06/18/2018 15.05 sec    Time  4    Period  Weeks    Status  On-going    Target Date  07/16/18      PT SHORT TERM GOAL #5   Title  Pt will negotiate at least 4 steps, modified independently, alternating step pattern for improved functional mobility in home.    Baseline  prefers step-to pattern    Time  4    Period  Weeks    Status  Revised    Target Date  07/16/18      PT SHORT TERM GOAL #6   Title       Baseline           PT Long Term Goals - 06/18/18 1732      PT LONG TERM GOAL #1   Title  Pt will verbalize plans for continued community fitness post d/c from PT to maximize gains made in PT.  TARGET 08/13/2018    Baseline       Time  8    Period  Weeks    Status  Revised    Target Date  08/13/18      PT LONG TERM GOAL #2   Title  Pt will perform squats to pick up objects from floor, 4 of 5 trials with no UE support, to demo improved functional strength and balance.    Baseline       Time  8    Period  Weeks    Status  New    Target Date  08/13/18      PT LONG TERM GOAL #3   Title  Pt will perform SLS at least 3 sec each leg for improved obstacle and stair negotiation.    Baseline  <1 sec 06/18/2018    Time  8    Period  Weeks    Status  Revised    Target Date  08/13/18      PT LONG TERM GOAL #4   Title  Pt will improve DGI score to at least 20/24 for decreased fall risk.    Baseline  13/24 06/18/2018    Time  8    Period  Weeks    Status  New    Target Date  08/13/18      PT LONG TERM  GOAL #5   Title  Pt will ambulate independently, 1000 ft, indoor and outdoor surfaces, no LOB for improved community gait.    Baseline  household distances no device 06/18/2018    Time  8    Period  Weeks    Status  On-going    Target Date  08/13/18            Plan - 06/29/18 0859    Clinical Impression Statement  The patient's HEP was updated today to include more challenging activities.  We also discussed increasing home activity and responsibilities like unloading dishwasher, cleaning, and performing a walking program.  Patient's gait looks more steady today without device as she leans more to the R and has a wider base of support when leaving with the cane.    PT Treatment/Interventions  ADLs/Self Care Home Management;DME Instruction;Gait training;Functional mobility training;Stair training;Therapeutic activities;Therapeutic exercise;Patient/family education;Neuromuscular re-education;Balance training    PT Next Visit Plan  Work on walking up/down steps, reaching  tasks, high level balance, squats, single leg stance.  Patient has leg weights at home to potentially add more strengthening.    PT Home Exercise Plan  Started new with medbridge code  [email or web address]    Consulted and Agree with Plan of Care  Patient;Family member/caregiver    Family Member Consulted  father       Patient will benefit from skilled therapeutic intervention in order to improve the following deficits and impairments:  Abnormal gait, Decreased balance, Decreased mobility, Decreased strength, Difficulty walking, Postural dysfunction  Visit Diagnosis: Muscle weakness (generalized)  Unsteadiness on feet  Other abnormalities of gait and mobility     Problem List Patient Active Problem List   Diagnosis Date Noted  . Sinusitis 03/03/2018  . Right thalamic infarction ([place]) 02/03/2018  . Cerebrovascular accident (CVA) ([place])   . Benign essential HTN   . Tachypnea   . Sensorineural hearing loss (SNHL) of both ears  01/31/2018  . Dyslipidemia 01/31/2018  . Acute ischemic stroke ([place]) 01/30/2018  . Pneumococcal bacteremia   . Pneumococcal meningitis   . Turner syndrome   . Hypothyroidism 01/15/2018  . Generalized anxiety disorder 01/15/2018  . Bicuspid aortic valve 04/06/2013  . HTN (hypertension) 04/06/2013    Chelsa Stout , PT 06/29/2018, 9:01 AM  [facility] [address] [place], [place], [address] Phone: [phone]   Fax:  [phone]  Name: Emma Martin MRN: [id number] Date of Birth: 03-22-1980

## 2018-06-29 NOTE — Patient Instructions (Signed)
Access Code: [email or web address]  URL: [email or web address]  Date: 06/29/2018  Prepared by: Rudell Cobb   Exercises Bird Dog - 10 reps - 2 sets - 3 seconds hold - 2x daily - 7x weekly Runner's March - 10 reps - 2 sets - 3 seconds hold - 2x daily - 7x weekly Single Leg Stance - 3 reps - 1 sets - 10 seconds hold - 2x daily - 7x weekly Standing Diagonal Chops with Medicine Ball - 10 reps - 3 sets - 1x daily - 7x weekly Wall Squat - 10 reps - 1 sets - 3-5 seconds hold - 2x daily - 7x weekly

## 2018-06-30 ENCOUNTER — Ambulatory Visit: Payer: BC Managed Care – PPO

## 2018-06-30 ENCOUNTER — Ambulatory Visit: Payer: BC Managed Care – PPO | Admitting: Occupational Therapy

## 2018-06-30 DIAGNOSIS — R41842 Visuospatial deficit: Secondary | ICD-10-CM

## 2018-06-30 DIAGNOSIS — R41844 Frontal lobe and executive function deficit: Secondary | ICD-10-CM | POA: Diagnosis not present

## 2018-06-30 DIAGNOSIS — R278 Other lack of coordination: Secondary | ICD-10-CM | POA: Diagnosis not present

## 2018-06-30 DIAGNOSIS — R2689 Other abnormalities of gait and mobility: Secondary | ICD-10-CM | POA: Diagnosis not present

## 2018-06-30 DIAGNOSIS — R41841 Cognitive communication deficit: Secondary | ICD-10-CM

## 2018-06-30 DIAGNOSIS — R2681 Unsteadiness on feet: Secondary | ICD-10-CM | POA: Diagnosis not present

## 2018-06-30 DIAGNOSIS — M6281 Muscle weakness (generalized): Secondary | ICD-10-CM | POA: Diagnosis not present

## 2018-06-30 DIAGNOSIS — R41840 Attention and concentration deficit: Secondary | ICD-10-CM | POA: Diagnosis not present

## 2018-06-30 NOTE — Therapy (Signed)
[facility] [address] [place], [place], [address] Phone: [phone]   Fax:  [phone]  Speech Language Pathology Treatment  Patient Details  Name: Emma Martin MRN: [id number] Date of Birth: August 13, 1980 Referring Provider (SLP): Maurice Small, MD   Encounter Date: 06/30/2018  End of Session - 06/30/18 1651    Visit Number  9    Number of Visits  17    Date for SLP Re-Evaluation  08/25/18    Authorization Type  --    Authorization - Visit Number  8    Authorization - Number of Visits  8    SLP Start Time  1500    SLP Stop Time   1545    SLP Time Calculation (min)  45 min    Activity Tolerance  Patient tolerated treatment well       Past Medical History:  Diagnosis Date  . Bicuspid aortic valve    , mild aortic insufficiency. (No SBE prophylaxis needed)  . Complication of anesthesia   . Dyslipidemia   . Generalized anxiety disorder    , with obsessive-compulsive traits.  . Hypertension   . Hypothyroidism   . Mixed gonadal dysgenesis    , (additional Y-bearing cell line) for which she reportedly underwent bilateral gonadectomy (patient unaware; needs confirmation) due to malignancy potential.  . PONV (postoperative nausea and vomiting)   . Primary amenorrhea    , treated starting at age 70 or 62 with estrogen and progesterone  . Scoliosis    , treated with Harrington rods (1997) for stabilization.  . Seasonal allergic rhinitis   . Short stature    , previously treated with growth hormone age [age] years.  Radford Pax syndrome    , previously followed by pediatric endocrinologist Freddy Jaksch, MD at [facility] through her early 77s).    Past Surgical History:  Procedure Laterality Date  . TEE WITHOUT CARDIOVERSION N/A 01/19/2018   Procedure: TRANSESOPHAGEAL ECHOCARDIOGRAM (TEE);  Surgeon: Jerline Pain, MD;  Location: [facility] ENDOSCOPY;  Service: Cardiovascular;  Laterality: N/A;  . TEE WITHOUT  CARDIOVERSION N/A 02/02/2018   Procedure: TRANSESOPHAGEAL ECHOCARDIOGRAM (TEE);  Surgeon: Elouise Munroe, MD;  Location: [facility];  Service: Cardiology;  Laterality: N/A;    There were no vitals filed for this visit.  Subjective Assessment - 06/30/18 1521    Subjective  Father indicates pt will undergo 07-14-18.     Currently in Pain?  No/denies            ADULT SLP TREATMENT - 06/30/18 1622      General Information   Behavior/Cognition  Alert;Cooperative;Pleasant mood      Cognitive-Linquistic Treatment   Treatment focused on  Cognition    Skilled Treatment  Father reports pt is amenable to the next thing on the schedule when he points it out. However father admits he has not been consistent with doing this. SLP strongly encouraged father to maintain consistency with schedule every day and told him rationale. SLP further stated that pt needs to do more variety cognitive tasks at home (not just solitaire and a cognitive task/day) in order to give herself best possible chance at improvement in cognition, and in speech clariry. Today, SLP had to provide total A for pt to initiate looking in her notebook to tell SLP she had MRI yesterday. SLP again reminded father that pt must do more varied tasks from day to day, and referenced the ST handout from SLP provided during pt's  initial ST sessions with ideas for cognitive tasks. SLP told father to bring Kindle next session in order to see if other brain games/apps can be downloaded. SLP educated father that he will need to provide significant support for pt to copmlete tasks necessary for her to improve her cognition due to pt being unable to adequately do this at this time. He voiced understanding.      Assessment / Recommendations / Plan   Plan  Continue with current plan of care      Progression Toward Goals   Progression toward goals  Not progressing toward goals (comment)   pt cont to do little at home, consistently        SLP  Short Term Goals - 06/11/18 1152      SLP SHORT TERM GOAL #1   Title  pt will demo sustained attention to complete 4 7-minute cognitive linguistic tasks, for improved participation in therapy over 3 sessions    Baseline  05/18/18; 05/25/18; 06/01/18    Time  --    Period  --    Status  Achieved   or 9 total visits, for all STGs     SLP SHORT TERM GOAL #2   Title  pt will demo incr'd awareness by telling SLP 3 things that are more difficult now than prior to CVA    Time  1    Period  --    Status  Not Met      SLP SHORT TERM GOAL #3   Title  pt will use a Child psychotherapist with occasional min A to do so    Time  1    Period  --    Status  Not Met       SLP Long Term Goals - 06/30/18 1655      SLP LONG TERM GOAL #1   Title  pt will demo sustained/selective attention to complete 3 12-minute cognitive linguistic tasks, for improved participation in therapy over 3 sessions    Time  2    Period  Weeks   or 17 sessions, for all LTGs   Status  On-going      SLP LONG TERM GOAL #2   Title  pt's father/family will report pt asking about schedule less than prior to ST    Time  2    Period  Weeks    Status  On-going      SLP LONG TERM GOAL #3   Title  pt will demo use of memory notebook/planner for homework, schedule verification, etc in 8 therapy sessions    Time  2    Period  Weeks    Status  On-going       Plan - 06/30/18 1651    Clinical Impression Statement  Moderate impairment in attention, memory, and awareness persists. SLP has encouraged father to assist pt with work, to include her in schedule planning - and he has largely not done so. Pt's attention and initiation are limited at this time and father was again reminded/educated today he will need to provide guidance to assist pt in a task, and to initiate a task. SLP questions father's "buy in" to therapy at this time and he reports pt is mostly amenable to performing tasks at home. SLP stressed heavily to  father today that he will need better consistency at home following pt's schedule to provide best posible opportunity for pt to make progress. Continue skilled ST at least next week to maximize cognition  and carryover of compensations for cognition for safety and independence. If pt motivation and participation in cognitive tasks at home does not improve in approx 3 more sessions, d/c may be appropriate.     Speech Therapy Frequency  2x / week    Duration  --   8 weeks or 17 visits   Treatment/Interventions  Cueing hierarchy;SLP instruction and feedback;Compensatory strategies;Functional tasks;Cognitive reorganization;Internal/external aids;Patient/family education    Potential to Achieve Goals  Good    Potential Considerations  Ability to learn/carryover information;Co-morbidities;Severity of impairments    Consulted and Agree with Plan of Care  Patient;Family member/caregiver    Family Member Consulted  father       Patient will benefit from skilled therapeutic intervention in order to improve the following deficits and impairments:   Cognitive communication deficit    Problem List Patient Active Problem List   Diagnosis Date Noted  . Sinusitis 03/03/2018  . Right thalamic infarction ([place]) 02/03/2018  . Cerebrovascular accident (CVA) ([place])   . Benign essential HTN   . Tachypnea   . Sensorineural hearing loss (SNHL) of both ears 01/31/2018  . Dyslipidemia 01/31/2018  . Acute ischemic stroke ([place]) 01/30/2018  . Pneumococcal bacteremia   . Pneumococcal meningitis   . Turner syndrome   . Hypothyroidism 01/15/2018  . Generalized anxiety disorder 01/15/2018  . Bicuspid aortic valve 04/06/2013  . HTN (hypertension) 04/06/2013    [facility] ,[place], [place]  06/30/2018, 4:57 PM  [facility] [address] [place], [place], [address] Phone: [phone]   Fax:  [phone]   Name: Emma Martin MRN: [id number] Date  of Birth: 03/01/1980

## 2018-06-30 NOTE — Therapy (Signed)
[facility] [address] [place], [place], [address] Phone: [phone]   Fax:  [phone]  Occupational Therapy Treatment  Patient Details  Name: Emma Martin MRN: [id number] Date of Birth: 12/31/1980 Referring Provider (OT): Maurice Small, MD   Encounter Date: 06/30/2018  OT End of Session - 06/30/18 1503    Visit Number  7    Number of Visits  25    Date for OT Re-Evaluation  08/14/18   date extended due to missed visits   Authorization Type  BCBS / Medicaid    OT Start Time  1407    OT Stop Time  1445    OT Time Calculation (min)  38 min    Activity Tolerance  Patient tolerated treatment well    Behavior During Therapy  [facility] for tasks assessed/performed       Past Medical History:  Diagnosis Date  . Bicuspid aortic valve    , mild aortic insufficiency. (No SBE prophylaxis needed)  . Complication of anesthesia   . Dyslipidemia   . Generalized anxiety disorder    , with obsessive-compulsive traits.  . Hypertension   . Hypothyroidism   . Mixed gonadal dysgenesis    , (additional Y-bearing cell line) for which she reportedly underwent bilateral gonadectomy (patient unaware; needs confirmation) due to malignancy potential.  . PONV (postoperative nausea and vomiting)   . Primary amenorrhea    , treated starting at age 89 or 57 with estrogen and progesterone  . Scoliosis    , treated with Harrington rods (1997) for stabilization.  . Seasonal allergic rhinitis   . Short stature    , previously treated with growth hormone age 47-15 years.  Radford Pax syndrome    , previously followed by pediatric endocrinologist Freddy Jaksch, MD at [facility] through her early 38s).    Past Surgical History:  Procedure Laterality Date  . TEE WITHOUT CARDIOVERSION N/A 01/19/2018   Procedure: TRANSESOPHAGEAL ECHOCARDIOGRAM (TEE);  Surgeon: Jerline Pain, MD;  Location: [facility] ENDOSCOPY;  Service: Cardiovascular;   Laterality: N/A;  . TEE WITHOUT CARDIOVERSION N/A 02/02/2018   Procedure: TRANSESOPHAGEAL ECHOCARDIOGRAM (TEE);  Surgeon: Elouise Munroe, MD;  Location: [place];  Service: Cardiology;  Laterality: N/A;    There were no vitals filed for this visit.  Subjective Assessment - 06/30/18 1510    Subjective   Pt denies pain    Pertinent History  meningitis, CVA 01/30/2018    Currently in Pain?  No/denies              CLINIC OPERATION CHANGES: Outpatient Neuro Rehab is open at lower capacity following universal masking, social distancing, and patient screening.  The patient's COVID risk of complications score is 2 Pt performed simple cooking task to make a box rice mix, pt performed task with mod v.c and demonstration(due to pt hearing loss, therapist wore face shield so that pt could read lips) Gripper set at level 2 to pick up 1 inch blocks, no significant difficulty with RUE, min difficulty and drops with LUE. Strengthening exercises for bilateral UE's shoulder flexion, biceps curls and shoulder abduction with 2 lbs weight in each hand.                 OT Short Term Goals - 06/18/18 1401      OT SHORT TERM GOAL #1   Title  I with initial HEP    Baseline  needs reminders to perfrom and min v.c  Time  6    Period  Weeks    Status  On-going   05/25/18:  not currently performing, ST provided pt with exercise checklist and instructed in use     OT SHORT TERM GOAL #2   Title  Pt will perform all basic ADLS modified independently    Baseline  distant supervision    Time  6    Period  Weeks    Status  On-going   06/18/18 pt reports continued distant supervision     OT SHORT TERM GOAL #3   Title  Pt will perform cold meal prep modified indpendently and basic cooking task with min A    Baseline  Pt is able to perfrom cold sandwich prep and reheat in microwave, pt has not attempted use of stove yet    Time  6    Period  Weeks    Status  Partially Met   makes a  sandwich     OT SHORT TERM GOAL #4   Title  Pt will perform basic home management modified I    Baseline  currently pt is only folding laundry, dependent with other home management    Time  6    Period  Weeks    Status  On-going   05/25/18:  only folding clothes     OT SHORT TERM GOAL #5   Title  Pt will demonstrate improved fine motor coordination as evidenced by decreasing 9 hole peg test by 5 secs for LUE.    Baseline  27.34-RUE, 36.75 -LUE    Time  6    Period  Weeks    Status  Achieved   06/01/18:  R-27.34sec, L-36.75sec     OT SHORT TERM GOAL #6   Title  Further assess visual perceptual skills and set goals prn--see LTGs    Time  6    Period  Weeks    Status  Deferred        OT Long Term Goals - 06/18/18 1512      OT LONG TERM GOAL #1   Title  Pt will perform mod complex home managment modified independently.    Baseline  only folding laundry, dependent for other tasks    Time  12    Period  Weeks    Status  On-going      OT LONG TERM GOAL #2   Title  Pt will perform basic cooking with supervision.    Baseline  Pt has only used microwave, she has not attempted stovetop cooking    Time  12    Period  Weeks    Status  On-going      OT LONG TERM GOAL #3   Title  Pt will increase bilateral grip strength by 5 lbs for increased functional use during ADLs.    Baseline  RUE 35, LUE 35    Time  12    Period  Weeks    Status  On-going      OT LONG TERM GOAL #4   Title  Pt will demonstrate adequate bilateral UE strength and endurance to retrieve a 3 lbs weight from overhead shelf with left and right UE's individually  without drops x 5 reps in standing modified independently.    Baseline  unable due to decreased UE strength, currently pt is exercising with 1 lb weights    Time  12    Period  Weeks    Status  On-going  OT LONG TERM GOAL #5   Title  Pt will demonstrate abilty to sequence a multi step functional or home management task modified independently.     Baseline  Pt requires assist for sequencing/ organization, and v.c to get started/ initiate task    Time  12    Period  Weeks    Status  On-going      OT LONG TERM GOAL #6   Title  Pt/ family will verbalize understanding cogntive compensation/ adapted strategies for ADLs/IADLs.    Baseline  needs reinforcement, and further education in strategies    Time  12    Period  Weeks    Status  On-going      OT LONG TERM GOAL #7   Title  Pt will perform simple environmental scanning with at least 95% accuracy for improved attention and safety.    Baseline  90%    Time  12    Period  Weeks    Status  On-going            Plan - 06/30/18 1506    Clinical Impression Statement  Pt is progressing towards goals. She was able to cook a simple rice mix with close supervison and mod v.c. for following steps. Pt encouraged pt to cook an egg at home assisted by her father. Pt's father is in agreement.    Occupational performance deficits (Please refer to evaluation for details):  ADL's;IADL's;Education;Social Participation;Leisure;Play;Work    Statistician;Safety Awareness;Sequencing;Energy/Drive;Perception    Rehab Potential  Fair    OT Frequency  2x / week    OT Duration  12 weeks    OT Treatment/Interventions  Self-care/ADL training;Fluidtherapy;Therapeutic exercise;Energy conservation;Gait Training;Functional Furniture conservator/restorer;Therapeutic activities;Visual/perceptual remediation/compensation;Balance training;Patient/family education;Cognitive remediation/compensation;Passive range of motion;Manual Therapy;DME and/or AE instruction;Neuromuscular education;Paraffin;Ultrasound;Moist Heat;Aquatic Therapy    Plan  continue simple home management/ functional activities.    Consulted and Agree with Plan of Care  Patient       Patient will benefit from skilled therapeutic intervention in order to improve the following deficits and impairments:  Body Structure /  Function / Physical Skills, Cognitive Skills  Visit Diagnosis: Muscle weakness (generalized)  Attention and concentration deficit  Visuospatial deficit  Frontal lobe and executive function deficit  Other lack of coordination    Problem List Patient Active Problem List   Diagnosis Date Noted  . Sinusitis 03/03/2018  . Right thalamic infarction ([place]) 02/03/2018  . Cerebrovascular accident (CVA) ([place])   . Benign essential HTN   . Tachypnea   . Sensorineural hearing loss (SNHL) of both ears 01/31/2018  . Dyslipidemia 01/31/2018  . Acute ischemic stroke ([place]) 01/30/2018  . Pneumococcal bacteremia   . Pneumococcal meningitis   . Turner syndrome   . Hypothyroidism 01/15/2018  . Generalized anxiety disorder 01/15/2018  . Bicuspid aortic valve 04/06/2013  . HTN (hypertension) 04/06/2013    , 06/30/2018, 3:11 PM  [facility] [address] [place], [place], [address] Phone: [phone]   Fax:  [phone]  Name: ELVERIA LAUDERBAUGH MRN: [id number] Date of Birth: 1980/09/19

## 2018-06-30 NOTE — Patient Instructions (Signed)
  Do one speech homework task each day - return all next session.

## 2018-07-07 DIAGNOSIS — Z8661 Personal history of infections of the central nervous system: Secondary | ICD-10-CM | POA: Diagnosis not present

## 2018-07-07 DIAGNOSIS — H903 Sensorineural hearing loss, bilateral: Secondary | ICD-10-CM | POA: Diagnosis not present

## 2018-07-07 DIAGNOSIS — Z9889 Other specified postprocedural states: Secondary | ICD-10-CM | POA: Diagnosis not present

## 2018-07-07 DIAGNOSIS — J323 Chronic sphenoidal sinusitis: Secondary | ICD-10-CM | POA: Diagnosis not present

## 2018-07-07 DIAGNOSIS — B9689 Other specified bacterial agents as the cause of diseases classified elsewhere: Secondary | ICD-10-CM | POA: Diagnosis not present

## 2018-07-07 DIAGNOSIS — J320 Chronic maxillary sinusitis: Secondary | ICD-10-CM | POA: Diagnosis not present

## 2018-07-08 ENCOUNTER — Ambulatory Visit: Payer: BC Managed Care – PPO | Admitting: Occupational Therapy

## 2018-07-08 ENCOUNTER — Encounter: Payer: Self-pay | Admitting: Occupational Therapy

## 2018-07-08 ENCOUNTER — Ambulatory Visit: Payer: BC Managed Care – PPO | Admitting: Rehabilitative and Restorative Service Providers"

## 2018-07-08 ENCOUNTER — Other Ambulatory Visit: Payer: Self-pay

## 2018-07-08 ENCOUNTER — Ambulatory Visit: Payer: BC Managed Care – PPO

## 2018-07-08 ENCOUNTER — Encounter: Payer: Self-pay | Admitting: Rehabilitative and Restorative Service Providers"

## 2018-07-08 DIAGNOSIS — M6281 Muscle weakness (generalized): Secondary | ICD-10-CM

## 2018-07-08 DIAGNOSIS — R41842 Visuospatial deficit: Secondary | ICD-10-CM

## 2018-07-08 DIAGNOSIS — Z1159 Encounter for screening for other viral diseases: Secondary | ICD-10-CM | POA: Diagnosis not present

## 2018-07-08 DIAGNOSIS — R278 Other lack of coordination: Secondary | ICD-10-CM

## 2018-07-08 DIAGNOSIS — R2689 Other abnormalities of gait and mobility: Secondary | ICD-10-CM

## 2018-07-08 DIAGNOSIS — R41841 Cognitive communication deficit: Secondary | ICD-10-CM | POA: Diagnosis not present

## 2018-07-08 DIAGNOSIS — H903 Sensorineural hearing loss, bilateral: Secondary | ICD-10-CM | POA: Diagnosis not present

## 2018-07-08 DIAGNOSIS — R2681 Unsteadiness on feet: Secondary | ICD-10-CM | POA: Diagnosis not present

## 2018-07-08 DIAGNOSIS — R41844 Frontal lobe and executive function deficit: Secondary | ICD-10-CM

## 2018-07-08 DIAGNOSIS — Z01812 Encounter for preprocedural laboratory examination: Secondary | ICD-10-CM | POA: Diagnosis not present

## 2018-07-08 DIAGNOSIS — R41840 Attention and concentration deficit: Secondary | ICD-10-CM | POA: Diagnosis not present

## 2018-07-08 NOTE — Patient Instructions (Addendum)
Strengthening: Resisted Flexion   Attach tube to door.  Hold tubing with one arm at side. Pull forward and up with elbow straight. Move shoulder through pain-free range of motion, no further than shoulder height. Repeat 10 times per set.  Do 1-2 sessions per day.    Strengthening: Resisted Extension   Attach one end to door.  Hold tubing in one hand, arm forward. Pull arm back, elbow straight. Repeat 10 times per set. Do 1-2 sessions per day.   Resisted Horizontal Abduction: Bilateral   Sit or stand, tubing in both hands, palms down and arms out in front. Keeping arms straight, pinch shoulder blades together and stretch arms out. Repeat 10 times per set.  Do 1-2 sessions per day.   Elbow Flexion: Resisted   Hold tubing wrapped around  One hand and and other end secured under foot, curl arm up as far as possible keeping elbow down by your side. Repeat 10 times per set.  Do 1-2 sessions per day.      Triceps Extension (Frontal)    One arm forward at chest height and bent to 90, end of band in hand, other end secured on same side shoulder by other hand, extend arm slowly. Hold 2seconds. Repeat 10 times, alternating arms. Do 1-2 sessions per day.   **REPEAT ALL WITH BOTH ARMS.

## 2018-07-08 NOTE — Therapy (Signed)
[facility] [address] [place], [place], [address] Phone: [phone]   Fax:  [phone]  Occupational Therapy Treatment  Patient Details  Name: Emma Martin MRN: [id number] Date of Birth: [birth year] Referring Provider (OT): Maurice Small, MD   Encounter Date: 07/08/2018  OT End of Session - 07/08/18 1202    Visit Number  8    Number of Visits  25    Date for OT Re-Evaluation  08/14/18   date extended due to missed visits   Authorization Type  BCBS / Medicaid    Authorization Time Period  BCBS 60 VL (OT/PT combined seen on same day counts as 2 visits)  Medicaid 07/10/18 1 visit approved.    Authorization - Visit Number  1   Medicaid   Authorization - Number of Visits  1    OT Start Time  1005    OT Stop Time  1045    OT Time Calculation (min)  40 min    Activity Tolerance  Patient tolerated treatment well    Behavior During Therapy  WFL for tasks assessed/performed       Past Medical History:  Diagnosis Date  . Bicuspid aortic valve    , mild aortic insufficiency. (No SBE prophylaxis needed)  . Complication of anesthesia   . Dyslipidemia   . Generalized anxiety disorder    , with obsessive-compulsive traits.  . Hypertension   . Hypothyroidism   . Mixed gonadal dysgenesis    , (additional Y-bearing cell line) for which she reportedly underwent bilateral gonadectomy (patient unaware; needs confirmation) due to malignancy potential.  . PONV (postoperative nausea and vomiting)   . Primary amenorrhea    , treated starting at age 31 or 77 with estrogen and progesterone  . Scoliosis    , treated with Harrington rods (1997) for stabilization.  . Seasonal allergic rhinitis   . Short stature    , previously treated with growth hormone age 32-15 years.  Radford Pax syndrome    , previously followed by pediatric endocrinologist Freddy Jaksch, MD at [facility] through her early 5s).    Past Surgical  History:  Procedure Laterality Date  . TEE WITHOUT CARDIOVERSION N/A 01/19/2018   Procedure: TRANSESOPHAGEAL ECHOCARDIOGRAM (TEE);  Surgeon: Jerline Pain, MD;  Location: [facility] ENDOSCOPY;  Service: Cardiovascular;  Laterality: N/A;  . TEE WITHOUT CARDIOVERSION N/A 02/02/2018   Procedure: TRANSESOPHAGEAL ECHOCARDIOGRAM (TEE);  Surgeon: Elouise Munroe, MD;  Location: [facility];  Service: Cardiology;  Laterality: N/A;    There were no vitals filed for this visit.  Subjective Assessment - 07/08/18 1034    Subjective   cochlear implant surgery scheduled for next Wednesday per Dad    Patient is accompanied by:  --   typed instructions/questions for pt in word document during session   Pertinent History  meningitis, CVA 01/30/2018    Currently in Pain?  No/denies         CLINIC OPERATION CHANGES: Outpatient Neuro Rehab is open at lower capacity following universal masking, social distancing, and patient screening.  The patient's COVID risk of complications score is 2.  Briefly discussed ADL/IADL participation with father who reports that pt has only cut up pepper (to help with dinner).  Dad reports that pt is performing shower transfer mod I.  Discussed IADL participation and schedule with pt.  Pt reports that she is not using schedule (written daily schedule is in pt notebook) and is only folding clothes.  Requested  pt generate list of at least 4 chores that she could do other than folding clothes during her "chore time" on her schedule.  Pt needed significantly incr time and then needed prompts for only listing 3 and including folding clothes.  Then, added appropriate activities including:  Wiping counter in bathroom/kitchen, loading dishwasher, unloading silverwear, sorting clothes.  Added this chore list to her notebook behind her schedule.    Added theraband HEP to pt's therapy notebook.       OT Education - 07/08/18 1159    Education Details  Yellow theraband HEP--see pt  instructions    Person(s) Educated  Patient    Methods  Explanation;Demonstration;Handout;Tactile cues;Other (comment)   written cues   Comprehension  Verbalized understanding;Returned demonstration;Tactile cues required       OT Short Term Goals - 07/08/18 1020      OT SHORT TERM GOAL #1   Title  I with initial HEP    Baseline  needs reminders to perfrom and min v.c    Time  6    Period  Weeks    Status  On-going   05/25/18:  not currently performing, ST provided pt with exercise checklist and instructed in use     OT SHORT TERM GOAL #2   Title  Pt will perform all basic ADLS modified independently    Baseline  distant supervision    Time  6    Period  Weeks    Status  Achieved   06/18/18 pt reports continued distant supervision.  07/08/18:  met     OT SHORT TERM GOAL #3   Title  Pt will perform cold meal prep modified indpendently and basic cooking task with min A    Baseline  Pt is able to perfrom cold sandwich prep and reheat in microwave, pt has not attempted use of stove yet    Time  6    Period  Weeks    Status  Partially Met   makes a sandwich.  07/08/18:  snack prep and cutting pepper     OT SHORT TERM GOAL #4   Title  Pt will perform basic home management modified I    Baseline  currently pt is only folding laundry, dependent with other home management    Time  6    Period  Weeks    Status  On-going   05/25/18:  only folding clothes.  07/08/18:  also picking up dishes per dad     OT SHORT TERM GOAL #5   Title  Pt will demonstrate improved fine motor coordination as evidenced by decreasing 9 hole peg test by 5 secs for LUE.    Baseline  27.34-RUE, 36.75 -LUE    Time  6    Period  Weeks    Status  Achieved   06/01/18:  R-27.34sec, L-36.75sec     OT SHORT TERM GOAL #6   Title  Further assess visual perceptual skills and set goals prn--see LTGs    Time  6    Period  Weeks    Status  Deferred        OT Long Term Goals - 06/18/18 1512      OT LONG TERM GOAL #1    Title  Pt will perform mod complex home managment modified independently.    Baseline  only folding laundry, dependent for other tasks    Time  12    Period  Weeks    Status  On-going  OT LONG TERM GOAL #2   Title  Pt will perform basic cooking with supervision.    Baseline  Pt has only used microwave, she has not attempted stovetop cooking    Time  12    Period  Weeks    Status  On-going      OT LONG TERM GOAL #3   Title  Pt will increase bilateral grip strength by 5 lbs for increased functional use during ADLs.    Baseline  RUE 35, LUE 35    Time  12    Period  Weeks    Status  On-going      OT LONG TERM GOAL #4   Title  Pt will demonstrate adequate bilateral UE strength and endurance to retrieve a 3 lbs weight from overhead shelf with left and right UE's individually  without drops x 5 reps in standing modified independently.    Baseline  unable due to decreased UE strength, currently pt is exercising with 1 lb weights    Time  12    Period  Weeks    Status  On-going      OT LONG TERM GOAL #5   Title  Pt will demonstrate abilty to sequence a multi step functional or home management task modified independently.    Baseline  Pt requires assist for sequencing/ organization, and v.c to get started/ initiate task    Time  12    Period  Weeks    Status  On-going      OT LONG TERM GOAL #6   Title  Pt/ family will verbalize understanding cogntive compensation/ adapted strategies for ADLs/IADLs.    Baseline  needs reinforcement, and further education in strategies    Time  12    Period  Weeks    Status  On-going      OT LONG TERM GOAL #7   Title  Pt will perform simple environmental scanning with at least 95% accuracy for improved attention and safety.    Baseline  90%    Time  12    Period  Weeks    Status  On-going            Plan - 07/08/18 1208    Clinical Impression Statement  Pt is progressing slowly with cognitive deficits.  Pt with limited  participation in IADLs at home.  Emphasized importance of participating more in simple IADLs.    Occupational performance deficits (Please refer to evaluation for details):  ADL's;IADL's;Education;Social Participation;Leisure;Play;Work    Statistician;Safety Awareness;Sequencing;Energy/Drive;Perception    Rehab Potential  Fair    OT Frequency  2x / week    OT Duration  12 weeks    OT Treatment/Interventions  Self-care/ADL training;Fluidtherapy;Therapeutic exercise;Energy conservation;Gait Training;Functional Furniture conservator/restorer;Therapeutic activities;Visual/perceptual remediation/compensation;Balance training;Patient/family education;Cognitive remediation/compensation;Passive range of motion;Manual Therapy;DME and/or AE instruction;Neuromuscular education;Paraffin;Ultrasound;Moist Heat;Aquatic Therapy    Plan  continue simple home management/ functional activities.    Consulted and Agree with Plan of Care  Patient       Patient will benefit from skilled therapeutic intervention in order to improve the following deficits and impairments:     Cognitive Skills: Attention, Problem Solve, Memory, Safety Awareness, Sequencing, Energy/Drive, Perception     Visit Diagnosis: 1. Frontal lobe and executive function deficit   2. Attention and concentration deficit   3. Visuospatial deficit   4. Other lack of coordination   5. Unsteadiness on feet   6. Muscle weakness (generalized)   7. Other abnormalities of  gait and mobility       Problem List Patient Active Problem List   Diagnosis Date Noted  . Sinusitis 03/03/2018  . 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 Cerebrovascular accident (CVA) ([place])   . Benign essential HTN   . Tachypnea   . Sensorineural hearing loss (SNHL) of both ears 01/31/2018  . Dyslipidemia 01/31/2018  . Acute ischemic stroke ([place]) 01/30/2018  . Pneumococcal bacteremia   . Pneumococcal meningitis   . Turner syndrome   .  Hypothyroidism 01/15/2018  . Generalized anxiety disorder 01/15/2018  . Bicuspid aortic valve 04/06/2013  . HTN (hypertension) 04/06/2013    [facility] 07/08/2018, 1:51 PM  [facility] [address] [place], [place], [address] Phone: [phone]   Fax:  [phone]  Name: Emma Martin MRN: [id number] Date of Birth: [birth year]   Vianne Bulls, OTR/L [facility] [address] [place], [place]  [address] [phone] phone [phone] 07/08/18 1:51 PM

## 2018-07-08 NOTE — Therapy (Signed)
[facility] [address] [place], [place], [address] Phone: [phone]   Fax:  [phone]  Physical Therapy Treatment  Patient Details  Name: Emma Martin MRN: [id number] Date of Birth: October 05, 1980 Referring Provider (PT): Maurice Small, MD  CLINIC OPERATION CHANGES: [facility] is open at lower capacity following universal masking, social distancing, and patient screening.  The patient's COVID risk of complications score is 2 .  Encounter Date: 07/08/2018  PT End of Session - 07/08/18 1100    Visit Number  11    Number of Visits  17   per recert 6/96/2952-8U/XL for 8 weeks   Date for PT Re-Evaluation  09/16/18    Authorization Type  BCBS, Medicaid (4th, 5th visits are telehealth); medicaid approval 10PT visits 05/26/2018-08/03/2018    Authorization Time Period  05/26/2018-08/03/2018    Authorization - Visit Number  2    Authorization - Number of Visits  10   Medicaid   PT Start Time  1100    PT Stop Time  1145    PT Time Calculation (min)  45 min    Equipment Utilized During Treatment  --   used computer (microsoft word) to type conversation with patient   Activity Tolerance  Patient tolerated treatment well    Behavior During Therapy  WFL for tasks assessed/performed       Past Medical History:  Diagnosis Date  . Bicuspid aortic valve    , mild aortic insufficiency. (No SBE prophylaxis needed)  . Complication of anesthesia   . Dyslipidemia   . Generalized anxiety disorder    , with obsessive-compulsive traits.  . Hypertension   . Hypothyroidism   . Mixed gonadal dysgenesis    , (additional Y-bearing cell line) for which she reportedly underwent bilateral gonadectomy (patient unaware; needs confirmation) due to malignancy potential.  . PONV (postoperative nausea and vomiting)   . Primary amenorrhea    , treated starting at age 89 or 27 with estrogen and progesterone  . Scoliosis    , treated with  Harrington rods (1997) for stabilization.  . Seasonal allergic rhinitis   . Short stature    , previously treated with growth hormone age 70-15 years.  Radford Pax syndrome    , previously followed by pediatric endocrinologist Freddy Jaksch, MD at [facility] through her early 64s).    Past Surgical History:  Procedure Laterality Date  . TEE WITHOUT CARDIOVERSION N/A 01/19/2018   Procedure: TRANSESOPHAGEAL ECHOCARDIOGRAM (TEE);  Surgeon: Jerline Pain, MD;  Location: [facility] ENDOSCOPY;  Service: Cardiovascular;  Laterality: N/A;  . TEE WITHOUT CARDIOVERSION N/A 02/02/2018   Procedure: TRANSESOPHAGEAL ECHOCARDIOGRAM (TEE);  Surgeon: Elouise Munroe, MD;  Location: [facility];  Service: Cardiology;  Laterality: N/A;    There were no vitals filed for this visit.  Subjective Assessment - 07/08/18 1132    Subjective  No change since last time.  Patient notes she wants to work on bending to pick up objects from the floor.    Patient Stated Goals  Want to feel stronger moving around, to not depend on walker.    Currently in Pain?  No/denies                       [facility] Adult PT Treatment/Exercise - 07/08/18 1144      Ambulation/Gait   Ambulation/Gait  Yes    Ambulation/Gait Assistance  5: Supervision    Ambulation/Gait Assistance Details  Patient and PT  worked on increasing stride length, hitting left heel strike, arm swing with tactile cues.  Also worked on toe and heel walking and jogging with CGA for safety.      Ambulation Distance (Feet)  800 Feet    Assistive device  None    Gait Pattern  Step-through pattern;Decreased stride length;Wide base of support;Decreased arm swing - right;Decreased arm swing - left;Decreased step length - left;Decreased step length - right    Stairs  Yes    Stairs Assistance  5: Supervision;4: Min guard    Stairs Assistance Details (indicate cue type and reason)  Patient and PT worked on reciprocal pattern of gait dec'ing UE support.  With  bilat UE support she performs with supervision and withut UE support she requires CGA for safety.    Number of Stairs  16      Therapeutic Activites    Therapeutic Activities  Other Therapeutic Activities    Other Therapeutic Activities  Bending to pick objects up from the floor including 5 and 10 lb kettle bells with CGA for safety.  worked on placing cones on the floor x 6 reps, picking them up and then navigating around cones.        Neuro Re-ed    Neuro Re-ed Details   Quadriped bird dog x 8 reps with CGA to min A due to imbalance.  Stepping up/down from compliant surfaces,  Standing to 1/2 kneeling<>standing with UE support.  Compliant surface standing on folded mat with narrow base + horizontal head turns and then with narrow base + eyes closed standing.  Standing in stride position on compliant surfaces closing eyes.  Stepping up and down from a ramp with CGA for safety.                   PT Short Term Goals - 07/08/18 1136      PT SHORT TERM GOAL #1   Title  Pt will perform HEP with supervision, including household tasks and mobility, for improved balance, strength and gait.  TARGET 07/16/2018    Baseline       Time  4    Period  Weeks    Status  Revised    Target Date  07/16/18      PT SHORT TERM GOAL #2   Title  Pt will improve 5x sit<>stand independently, to less than or equal to 13 seconds to demonstrate improved functional strength.    Baseline  15.19 sec 06/18/2018 (with cues 10.9 sec)    Time  4    Period  Weeks    Status  Revised    Target Date  07/16/18      PT SHORT TERM GOAL #3   Title  Pt will improve DGI to at least 16/24 for decreased fall risk.    Baseline       Time  4    Period  Weeks    Status  Revised    Target Date  07/16/18      PT SHORT TERM GOAL #4   Title  Pt will improve TUG score to less than or equal to 13.5 seconds for decreased fall risk.    Baseline  06/18/2018 15.05 sec    Time  4    Period  Weeks    Status  On-going    Target  Date  07/16/18      PT SHORT TERM GOAL #5   Title  Pt will negotiate at least 4 steps, modified independently,  alternating step pattern for improved functional mobility in home.    Baseline  prefers step-to pattern    Time  4    Period  Weeks    Status  Revised    Target Date  07/16/18      PT SHORT TERM GOAL #6   Title       Baseline           PT Long Term Goals - 06/18/18 1732      PT LONG TERM GOAL #1   Title  Pt will verbalize plans for continued community fitness post d/c from PT to maximize gains made in PT.  TARGET 08/13/2018    Baseline       Time  8    Period  Weeks    Status  Revised    Target Date  08/13/18      PT LONG TERM GOAL #2   Title  Pt will perform squats to pick up objects from floor, 4 of 5 trials with no UE support, to demo improved functional strength and balance.    Baseline       Time  8    Period  Weeks    Status  New    Target Date  08/13/18      PT LONG TERM GOAL #3   Title  Pt will perform SLS at least 3 sec each leg for improved obstacle and stair negotiation.    Baseline  <1 sec 06/18/2018    Time  8    Period  Weeks    Status  Revised    Target Date  08/13/18      PT LONG TERM GOAL #4   Title  Pt will improve DGI score to at least 20/24 for decreased fall risk.    Baseline  13/24 06/18/2018    Time  8    Period  Weeks    Status  New    Target Date  08/13/18      PT LONG TERM GOAL #5   Title  Pt will ambulate independently, 1000 ft, indoor and outdoor surfaces, no LOB for improved community gait.    Baseline  household distances no device 06/18/2018    Time  8    Period  Weeks    Status  On-going    Target Date  08/13/18            Plan - 07/08/18 1158    Clinical Impression Statement  The patient needs encouragement to begin steps with a reciprocal pattern, especially when descending.  Without UE support, she does need assist, but is able to do this with UE support and supervision.  PT continues to advance towards STGs  working on safety with gait, functional tasks of reaching, bending, and dynamic gait/balance activities.    PT Treatment/Interventions  ADLs/Self Care Home Management;DME Instruction;Gait training;Functional mobility training;Stair training;Therapeutic activities;Therapeutic exercise;Patient/family education;Neuromuscular re-education;Balance training    PT Next Visit Plan  Emphasize stair training, reaching, bending tasks, dynamic gait, jogging/high level balance, single leg stance.   Patient has leg weights at home if needed.    PT Home Exercise Plan  Started new with medbridge code  [id number]    Consulted and Agree with Plan of Care  Patient;Family member/caregiver    Family Member Consulted  father waited in lobby due to busier gym time.8       Patient will benefit from skilled therapeutic intervention in order to improve the following deficits and impairments:  Visit Diagnosis: 1. Muscle weakness (generalized)   2. Unsteadiness on feet   3. Other abnormalities of gait and mobility        Problem List Patient Active Problem List   Diagnosis Date Noted  . Sinusitis 03/03/2018  . Right thalamic infarction ([place]) 02/03/2018  . Cerebrovascular accident (CVA) ([place])   . Benign essential HTN   . Tachypnea   . Sensorineural hearing loss (SNHL) of both ears 01/31/2018  . Dyslipidemia 01/31/2018  . Acute ischemic stroke ([place]) 01/30/2018  . Pneumococcal bacteremia   . Pneumococcal meningitis   . Turner syndrome   . Hypothyroidism 01/15/2018  . Generalized anxiety disorder 01/15/2018  . Bicuspid aortic valve 04/06/2013  . HTN (hypertension) 04/06/2013    Micole Delehanty, PT 07/08/2018, 12:01 PM  [facility] [address] [place], [place], [address] Phone: [phone]   Fax:  [phone]  Name: ZAYA KESSENICH MRN: [id number] Date of Birth: 1980/02/02

## 2018-07-09 NOTE — Therapy (Signed)
[facility] [address] [place], [place], [address] Phone: [phone]   Fax:  [phone]  Speech Language Pathology Treatment  Patient Details  Name: Emma Martin MRN: [id number] Date of Birth: 05-11-1980 Referring Provider (SLP): Maurice Small, MD   Encounter Date: 07/08/2018  End of Session - 07/09/18 1202    Visit Number  10    Number of Visits  17    Date for SLP Re-Evaluation  08/25/18    SLP Start Time  0902    SLP Stop Time   0945    SLP Time Calculation (min)  43 min    Activity Tolerance  Patient tolerated treatment well       Past Medical History:  Diagnosis Date  . Bicuspid aortic valve    , mild aortic insufficiency. (No SBE prophylaxis needed)  . Complication of anesthesia   . Dyslipidemia   . Generalized anxiety disorder    , with obsessive-compulsive traits.  . Hypertension   . Hypothyroidism   . Mixed gonadal dysgenesis    , (additional Y-bearing cell line) for which she reportedly underwent bilateral gonadectomy (patient unaware; needs confirmation) due to malignancy potential.  . PONV (postoperative nausea and vomiting)   . Primary amenorrhea    , treated starting at age 44 or 21 with estrogen and progesterone  . Scoliosis    , treated with Harrington rods (1997) for stabilization.  . Seasonal allergic rhinitis   . Short stature    , previously treated with growth hormone age 21-15 years.  Radford Pax syndrome    , previously followed by pediatric endocrinologist Freddy Jaksch, MD at [facility] through her early 70s).    Past Surgical History:  Procedure Laterality Date  . TEE WITHOUT CARDIOVERSION N/A 01/19/2018   Procedure: TRANSESOPHAGEAL ECHOCARDIOGRAM (TEE);  Surgeon: Jerline Pain, MD;  Location: [facility] ENDOSCOPY;  Service: Cardiovascular;  Laterality: N/A;  . TEE WITHOUT CARDIOVERSION N/A 02/02/2018   Procedure: TRANSESOPHAGEAL ECHOCARDIOGRAM (TEE);  Surgeon: Elouise Munroe, MD;  Location: [facility];  Service: Cardiology;  Laterality: N/A;    There were no vitals filed for this visit.  Subjective Assessment - 07/09/18 0905    Subjective  Father states all appointments need to be canceled the day after pt's sx next week (07-14-18). SLP wrote follow up note to have this done.    Patient is accompained by:  Family member   father   Currently in Pain?  No/denies            ADULT SLP TREATMENT - 07/09/18 0001      General Information   Behavior/Cognition  Alert;Cooperative;Pleasant mood;Distractible      Cognitive-Linquistic Treatment   Treatment focused on  Cognition    Skilled Treatment  Pt father brought Melanee Spry that pt uses to play Solitaire. SLP asked father how schedule had been going and he said "A little better. She did her PT exercises." SLP cont wonder how much pt is doing at home that is beneficial to her recovery. SLP spent today's session working with pt to download other brain games/apps - [employer], Peak, and Elevate wree not availabel for Kindle, as SLP had pt type in responses into search in [employer], requiring initial cues to do so, and subsequent cues for 1 or remaining 2 other apps. SLP worked with pt on a language-based cognitive app - [employer]. Pt req'd cues for reasoning, initiation/attention. Educated father on how to use WITH patient, SLP  modeled cueing for father at home.      Assessment / Recommendations / Plan   Plan  Continue with current plan of care      Progression Toward Goals   Progression toward goals  --   SLP questions pt performance of therapeutic activity at home      SLP Education - 07/09/18 1201    Education Details  how to cue pt with [employer] app    Person(s) Educated  Parent(s)    Methods  Explanation;Demonstration    Comprehension  Verbalized understanding;Need further instruction;Verbal cues required       SLP Short Term Goals - 06/11/18 1152      SLP SHORT TERM GOAL #1    Title  pt will demo sustained attention to complete 4 7-minute cognitive linguistic tasks, for improved participation in therapy over 3 sessions    Baseline  05/18/18; 05/25/18; 06/01/18    Time  --    Period  --    Status  Achieved   or 9 total visits, for all STGs     SLP SHORT TERM GOAL #2   Title  pt will demo incr'd awareness by telling SLP 3 things that are more difficult now than prior to CVA    Time  1    Period  --    Status  Not Met      SLP SHORT TERM GOAL #3   Title  pt will use a Child psychotherapist with occasional min A to do so    Time  1    Period  --    Status  Not Met       SLP Long Term Goals - 07/09/18 1204      SLP LONG TERM GOAL #1   Title  pt will demo sustained/selective attention to complete 3 12-minute cognitive linguistic tasks, for improved participation in therapy over 3 sessions    Time  1    Period  Weeks   or 17 sessions, for all LTGs   Status  On-going      SLP LONG TERM GOAL #2   Title  pt's father/family will report pt asking about schedule less than prior to ST    Time  1    Period  Weeks    Status  On-going      SLP LONG TERM GOAL #3   Title  pt will demo use of memory notebook/planner for homework, schedule verification, etc in 8 therapy sessions    Time  1    Period  Weeks    Status  On-going       Plan - 07/09/18 1202    Clinical Impression Statement  Moderate impairment in attention, memory, and awareness persists. SLP has encouraged father to assist pt with cognitive work at home and he commented today on pt performing PT exercises re: her daily schedule. Pt's attention and initiation cont limited at this time and SLP overtly modeled cueing father could provide at home with pt with a cognitive linguistic app ([employer]). SLP questions father's "buy in" to therapy at this time and he reports pt is mostly amenable to performing tasks at home. SLP stressed heavily to father today that he will need better consistency  at home following pt's schedule to provide best posible opportunity for pt to make progress. Continue skilled ST at least next week to maximize cognition and carryover of compensations for cognition for safety and independence. If pt motivation and participation in cognitive  tasks at home does not improve d/c may be appropriate.    Speech Therapy Frequency  2x / week    Duration  --   8 weeks or 17 visits   Treatment/Interventions  Cueing hierarchy;SLP instruction and feedback;Compensatory strategies;Functional tasks;Cognitive reorganization;Internal/external aids;Patient/family education    Potential to Achieve Goals  Good    Potential Considerations  Ability to learn/carryover information;Co-morbidities;Severity of impairments    Consulted and Agree with Plan of Care  Patient;Family member/caregiver    Family Member Consulted  father       Patient will benefit from skilled therapeutic intervention in order to improve the following deficits and impairments:   1. Cognitive communication deficit       Problem List Patient Active Problem List   Diagnosis Date Noted  . Sinusitis 03/03/2018  . Right thalamic infarction ([place]) 02/03/2018  . Cerebrovascular accident (CVA) ([place])   . Benign essential HTN   . Tachypnea   . Sensorineural hearing loss (SNHL) of both ears 01/31/2018  . Dyslipidemia 01/31/2018  . Acute ischemic stroke ([place]) 01/30/2018  . Pneumococcal bacteremia   . Pneumococcal meningitis   . Turner syndrome   . Hypothyroidism 01/15/2018  . Generalized anxiety disorder 01/15/2018  . Bicuspid aortic valve 04/06/2013  . HTN (hypertension) 04/06/2013    [facility] ,[place], [place]  07/09/2018, 12:07 PM  [facility] [address] [place], [place], [address] Phone: [phone]   Fax:  [phone]   Name: Emma Martin MRN: [id number] Date of Birth: 1980/04/20

## 2018-07-13 ENCOUNTER — Other Ambulatory Visit: Payer: Self-pay

## 2018-07-13 ENCOUNTER — Ambulatory Visit: Payer: BC Managed Care – PPO | Admitting: Occupational Therapy

## 2018-07-13 DIAGNOSIS — R41840 Attention and concentration deficit: Secondary | ICD-10-CM | POA: Diagnosis not present

## 2018-07-13 DIAGNOSIS — R41842 Visuospatial deficit: Secondary | ICD-10-CM

## 2018-07-13 DIAGNOSIS — R7881 Bacteremia: Secondary | ICD-10-CM | POA: Diagnosis not present

## 2018-07-13 DIAGNOSIS — M6281 Muscle weakness (generalized): Secondary | ICD-10-CM | POA: Diagnosis not present

## 2018-07-13 DIAGNOSIS — G001 Pneumococcal meningitis: Secondary | ICD-10-CM | POA: Diagnosis not present

## 2018-07-13 DIAGNOSIS — R41844 Frontal lobe and executive function deficit: Secondary | ICD-10-CM | POA: Diagnosis not present

## 2018-07-13 DIAGNOSIS — R41841 Cognitive communication deficit: Secondary | ICD-10-CM | POA: Diagnosis not present

## 2018-07-13 DIAGNOSIS — R2689 Other abnormalities of gait and mobility: Secondary | ICD-10-CM | POA: Diagnosis not present

## 2018-07-13 DIAGNOSIS — R278 Other lack of coordination: Secondary | ICD-10-CM

## 2018-07-13 DIAGNOSIS — R2681 Unsteadiness on feet: Secondary | ICD-10-CM

## 2018-07-13 DIAGNOSIS — G039 Meningitis, unspecified: Secondary | ICD-10-CM | POA: Diagnosis not present

## 2018-07-13 DIAGNOSIS — I639 Cerebral infarction, unspecified: Secondary | ICD-10-CM | POA: Diagnosis not present

## 2018-07-13 NOTE — Therapy (Signed)
[facility] [address] [place], [place], [address] Phone: [phone]   Fax:  [phone]  Occupational Therapy Treatment  Patient Details  Name: Emma Martin MRN: [id number] Date of Birth: 24-Nov-1980 Referring Provider (OT): Maurice Small, MD   Encounter Date: 07/13/2018  OT End of Session - 07/13/18 1528    Visit Number  9    Number of Visits  25    Date for OT Re-Evaluation  08/14/18   date extended due to missed visits   Authorization Type  BCBS / Medicaid    Authorization Time Period  BCBS 60 VL (OT/PT combined seen on same day counts as 2 visits)  Medicaid 07/10/18 1 visit approved.    Authorization - Visit Number  1   Medicaid   Authorization - Number of Visits  1    OT Start Time  1005    OT Stop Time  1045    OT Time Calculation (min)  40 min    Activity Tolerance  Patient tolerated treatment well    Behavior During Therapy  WFL for tasks assessed/performed       Past Medical History:  Diagnosis Date  . Bicuspid aortic valve    , mild aortic insufficiency. (No SBE prophylaxis needed)  . Complication of anesthesia   . Dyslipidemia   . Generalized anxiety disorder    , with obsessive-compulsive traits.  . Hypertension   . Hypothyroidism   . Mixed gonadal dysgenesis    , (additional Y-bearing cell line) for which she reportedly underwent bilateral gonadectomy (patient unaware; needs confirmation) due to malignancy potential.  . PONV (postoperative nausea and vomiting)   . Primary amenorrhea    , treated starting at age 16 or 89 with estrogen and progesterone  . Scoliosis    , treated with Harrington rods (1997) for stabilization.  . Seasonal allergic rhinitis   . Short stature    , previously treated with growth hormone age 80-15 years.  Radford Pax syndrome    , previously followed by pediatric endocrinologist Freddy Jaksch, MD at [facility] through her early 38s).    Past Surgical  History:  Procedure Laterality Date  . TEE WITHOUT CARDIOVERSION N/A 01/19/2018   Procedure: TRANSESOPHAGEAL ECHOCARDIOGRAM (TEE);  Surgeon: Jerline Pain, MD;  Location: [facility] ENDOSCOPY;  Service: Cardiovascular;  Laterality: N/A;  . TEE WITHOUT CARDIOVERSION N/A 02/02/2018   Procedure: TRANSESOPHAGEAL ECHOCARDIOGRAM (TEE);  Surgeon: Elouise Munroe, MD;  Location: [place];  Service: Cardiology;  Laterality: N/A;    There were no vitals filed for this visit.  Subjective Assessment - 07/13/18 1527    Subjective   cochlear implant surgery scheduled for tomorrow per Dad    Patient is accompanied by:  --   typed instructions/questions for pt in word document during session   Pertinent History  meningitis, CVA 01/30/2018    Currently in Pain?  No/denies    Pain Onset  Today                   CLINIC OPERATION CHANGES: Outpatient Neuro Rehab is open at lower capacity following universal masking, social distancing, and patient screening.  The patient's COVID risk of complications score is 2.  Reviewed yellow theraband HEP 10-15 reps each, min-mod v.c Pt's father is able to cue her at home.  Functional organization task to plan a meal and generate a menu, min v.c Activities for alternating attention and map reading on constant therapy,min difficulty/ min v.c  Reinforced importance of pt. Participation in [place], and posting chore list            OT Short Term Goals - 07/13/18 1021      OT SHORT TERM GOAL #1   Title  I with initial HEP    Baseline  needs reminders to perfrom and min v.c    Time  6    Period  Weeks    Status  Achieved   05/25/18:  not currently performing, ST provided pt with exercise checklist and instructed in use     OT SHORT TERM GOAL #2   Title  Pt will perform all basic ADLS modified independently    Baseline  distant supervision    Time  6    Period  Weeks    Status  Achieved   06/18/18 pt reports continued distant supervision.  07/08/18:   met     OT SHORT TERM GOAL #3   Title  Pt will perform cold meal prep modified indpendently and basic cooking task with min A    Baseline  Pt is able to perfrom cold sandwich prep and reheat in microwave, pt has not attempted use of stove yet    Time  6    Period  Weeks    Status  Partially Met   makes a sandwich.  07/08/18:  snack prep and cutting pepper     OT SHORT TERM GOAL #4   Title  Pt will perform basic home management modified I    Baseline  currently pt is only folding laundry, dependent with other home management    Time  6    Period  Weeks    Status  On-going   05/25/18:  only folding clothes.  07/08/18:  also picking up dishes per dad     OT SHORT TERM GOAL #5   Title  Pt will demonstrate improved fine motor coordination as evidenced by decreasing 9 hole peg test by 5 secs for LUE.    Baseline  27.34-RUE, 36.75 -LUE    Time  6    Period  Weeks    Status  Achieved   06/01/18:  R-27.34sec, L-36.75sec     OT SHORT TERM GOAL #6   Title  Further assess visual perceptual skills and set goals prn--see LTGs    Time  6    Period  Weeks    Status  Deferred        OT Long Term Goals - 07/13/18 1021      OT LONG TERM GOAL #1   Title  Pt will perform mod complex home managment modified independently.    Baseline  only folding laundry, dependent for other tasks    Time  12    Period  Weeks    Status  On-going      OT LONG TERM GOAL #2   Title  Pt will perform basic cooking with supervision.    Baseline  Pt has only used microwave, she has not attempted stovetop cooking    Time  12    Period  Weeks    Status  On-going   Pt performed 1x in clinic at a supervision level     OT LONG TERM GOAL #3   Title  Pt will increase bilateral grip strength by 5 lbs for increased functional use during ADLs.    Baseline  RUE 35, LUE 35    Time  12    Period  Weeks  Status  On-going      OT LONG TERM GOAL #4   Title  Pt will demonstrate adequate bilateral UE strength and  endurance to retrieve a 3 lbs weight from overhead shelf with left and right UE's individually  without drops x 5 reps in standing modified independently.    Baseline  unable due to decreased UE strength, currently pt is exercising with 1 lb weights    Time  12    Period  Weeks    Status  On-going      OT LONG TERM GOAL #5   Title  Pt will demonstrate abilty to sequence a multi step functional or home management task modified independently.    Baseline  Pt requires assist for sequencing/ organization, and v.c to get started/ initiate task    Time  12    Period  Weeks    Status  On-going      OT LONG TERM GOAL #6   Title  Pt/ family will verbalize understanding cogntive compensation/ adapted strategies for ADLs/IADLs.    Baseline  needs reinforcement, and further education in strategies    Time  12    Period  Weeks    Status  On-going      OT LONG TERM GOAL #7   Title  Pt will perform simple environmental scanning with at least 95% accuracy for improved attention and safety.    Baseline  90%    Time  12    Period  Weeks    Status  On-going            Plan - 07/13/18 1529    Clinical Impression Statement  Pt is progressing towards goals. She demonstrates improving ability to sequence a functional task and improved alternating attention.    Occupational performance deficits (Please refer to evaluation for details):  ADL's;IADL's;Education;Social Participation;Leisure;Play;Work    Statistician;Safety Awareness;Sequencing;Energy/Drive;Perception    Rehab Potential  Fair    OT Frequency  2x / week    OT Duration  12 weeks    OT Treatment/Interventions  Self-care/ADL training;Fluidtherapy;Therapeutic exercise;Energy conservation;Gait Training;Functional Furniture conservator/restorer;Therapeutic activities;Visual/perceptual remediation/compensation;Balance training;Patient/family education;Cognitive remediation/compensation;Passive range of motion;Manual  Therapy;DME and/or AE instruction;Neuromuscular education;Paraffin;Ultrasound;Moist Heat;Aquatic Therapy    Plan  continue simple home management/ functional activities.    Consulted and Agree with Plan of Care  Patient       Patient will benefit from skilled therapeutic intervention in order to improve the following deficits and impairments:     Cognitive Skills: Attention, Problem Solve, Memory, Safety Awareness, Sequencing, Energy/Drive, Perception     Visit Diagnosis: 1. Muscle weakness (generalized)   2. Unsteadiness on feet   3. Other abnormalities of gait and mobility   4. Frontal lobe and executive function deficit   5. Attention and concentration deficit   6. Visuospatial deficit   7. Other lack of coordination       Problem List Patient Active Problem List   Diagnosis Date Noted  . Sinusitis 03/03/2018  . Right thalamic infarction ([place]) 02/03/2018  . Cerebrovascular accident (CVA) ([place])   . Benign essential HTN   . Tachypnea   . Sensorineural hearing loss (SNHL) of both ears 01/31/2018  . Dyslipidemia 01/31/2018  . Acute ischemic stroke ([place]) 01/30/2018  . Pneumococcal bacteremia   . Pneumococcal meningitis   . Turner syndrome   . Hypothyroidism 01/15/2018  . Generalized anxiety disorder 01/15/2018  . Bicuspid aortic valve 04/06/2013  . HTN (hypertension) 04/06/2013    Mery Guadalupe  07/13/2018, 3:30 PM  [facility] [address] [place], [place], [address] Phone: [phone]   Fax:  [phone]  Name: Emma Martin MRN: [id number] Date of Birth: May 22, 1980

## 2018-07-14 DIAGNOSIS — Q8789 Other specified congenital malformation syndromes, not elsewhere classified: Secondary | ICD-10-CM | POA: Diagnosis not present

## 2018-07-14 DIAGNOSIS — H903 Sensorineural hearing loss, bilateral: Secondary | ICD-10-CM | POA: Diagnosis not present

## 2018-07-14 DIAGNOSIS — Z8661 Personal history of infections of the central nervous system: Secondary | ICD-10-CM | POA: Diagnosis not present

## 2018-07-14 DIAGNOSIS — Q969 Turner's syndrome, unspecified: Secondary | ICD-10-CM | POA: Diagnosis not present

## 2018-07-15 ENCOUNTER — Ambulatory Visit: Payer: BC Managed Care – PPO | Admitting: Rehabilitative and Restorative Service Providers"

## 2018-07-15 ENCOUNTER — Ambulatory Visit: Payer: BC Managed Care – PPO

## 2018-07-21 ENCOUNTER — Ambulatory Visit: Payer: BC Managed Care – PPO | Attending: Family Medicine | Admitting: Occupational Therapy

## 2018-07-21 ENCOUNTER — Ambulatory Visit: Payer: BC Managed Care – PPO

## 2018-07-21 ENCOUNTER — Encounter: Payer: Self-pay | Admitting: Rehabilitative and Restorative Service Providers"

## 2018-07-21 ENCOUNTER — Other Ambulatory Visit: Payer: Self-pay

## 2018-07-21 ENCOUNTER — Ambulatory Visit: Payer: BC Managed Care – PPO | Admitting: Rehabilitative and Restorative Service Providers"

## 2018-07-21 DIAGNOSIS — R41841 Cognitive communication deficit: Secondary | ICD-10-CM

## 2018-07-21 DIAGNOSIS — R2689 Other abnormalities of gait and mobility: Secondary | ICD-10-CM | POA: Insufficient documentation

## 2018-07-21 DIAGNOSIS — R41844 Frontal lobe and executive function deficit: Secondary | ICD-10-CM

## 2018-07-21 DIAGNOSIS — R41840 Attention and concentration deficit: Secondary | ICD-10-CM | POA: Insufficient documentation

## 2018-07-21 DIAGNOSIS — I69315 Cognitive social or emotional deficit following cerebral infarction: Secondary | ICD-10-CM | POA: Diagnosis not present

## 2018-07-21 DIAGNOSIS — R278 Other lack of coordination: Secondary | ICD-10-CM | POA: Diagnosis not present

## 2018-07-21 DIAGNOSIS — R41842 Visuospatial deficit: Secondary | ICD-10-CM | POA: Diagnosis not present

## 2018-07-21 DIAGNOSIS — M6281 Muscle weakness (generalized): Secondary | ICD-10-CM

## 2018-07-21 DIAGNOSIS — R2681 Unsteadiness on feet: Secondary | ICD-10-CM | POA: Insufficient documentation

## 2018-07-21 DIAGNOSIS — I639 Cerebral infarction, unspecified: Secondary | ICD-10-CM | POA: Diagnosis not present

## 2018-07-21 NOTE — Therapy (Signed)
[facility] [address] [place], [place], [address] Phone: [phone]   Fax:  [phone]  Speech Language Pathology Treatment  Patient Details  Name: Emma Martin MRN: [id number] Date of Birth: [birth year] Referring Provider (SLP): Maurice Small, MD   Encounter Date: 07/21/2018  End of Session - 07/21/18 1155    Visit Number  11    Number of Visits  17    Date for SLP Re-Evaluation  08/25/18    Authorization Type  end date 08-04-18 medicaid    SLP Start Time  1002    SLP Stop Time   1045    SLP Time Calculation (min)  43 min    Activity Tolerance  Patient tolerated treatment well       Past Medical History:  Diagnosis Date  . Bicuspid aortic valve    , mild aortic insufficiency. (No SBE prophylaxis needed)  . Complication of anesthesia   . Dyslipidemia   . Generalized anxiety disorder    , with obsessive-compulsive traits.  . Hypertension   . Hypothyroidism   . Mixed gonadal dysgenesis    , (additional Y-bearing cell line) for which she reportedly underwent bilateral gonadectomy (patient unaware; needs confirmation) due to malignancy potential.  . PONV (postoperative nausea and vomiting)   . Primary amenorrhea    , treated starting at age 81 or 24 with estrogen and progesterone  . Scoliosis    , treated with Harrington rods (1997) for stabilization.  . Seasonal allergic rhinitis   . Short stature    , previously treated with growth hormone age 28-15 years.  Radford Pax syndrome    , previously followed by pediatric endocrinologist Freddy Jaksch, MD at [facility] through her early 31s).    Past Surgical History:  Procedure Laterality Date  . TEE WITHOUT CARDIOVERSION N/A 01/19/2018   Procedure: TRANSESOPHAGEAL ECHOCARDIOGRAM (TEE);  Surgeon: Jerline Pain, MD;  Location: [facility] ENDOSCOPY;  Service: Cardiovascular;  Laterality: N/A;  . TEE WITHOUT CARDIOVERSION N/A 02/02/2018   Procedure:  TRANSESOPHAGEAL ECHOCARDIOGRAM (TEE);  Surgeon: Elouise Munroe, MD;  Location: [facility];  Service: Cardiology;  Laterality: N/A;    There were no vitals filed for this visit.  Subjective Assessment - 07/21/18 0958    Subjective  Pt with sx on 07-14-18, father states recovery has been WNL to present.    Patient is accompained by:  Family member   father           ADULT SLP TREATMENT - 07/21/18 1009      General Information   Behavior/Cognition  Alert;Cooperative;Pleasant mood;Distractible      Cognitive-Linquistic Treatment   Treatment focused on  Cognition    Skilled Treatment  "She's been playing a little (on the [place]). I've also seen her doing (paper) word finds." (father stated OT said word finds would be helpful for pt). FAther told SLP that it he has diffiuclty keeping a schedule, so reminding pt/guiding pt to keep a schedule is difficult for him to do. Amanda Cockayne is supposed to bring information on cochlear implant so pt can read and highlight information she will find necessary or interesting. Today, SLP asked pt to find an aritcle on people.com and pt demo'd good alternating attention between SLP questions about the content and returning to reading out loud after questions. Pt req'd to look back at article to answer questions approx 60% of the time. Pt impulsivity made pt answer incorrectly approx 20% of the time and after SLP  cue to read the question more carefully or a repeat of the question, pt answered correctly. When father handed pt notebook she looked at her schedule for today appropriately.       Assessment / Recommendations / Plan   Plan  Continue with current plan of care      Progression Toward Goals   Progression toward goals  Not progressing toward goals (comment)   pt is getting suboptimal support at home for homework        SLP Short Term Goals - 06/11/18 1152      SLP SHORT TERM GOAL #1   Title  pt will demo sustained attention to complete 4  7-minute cognitive linguistic tasks, for improved participation in therapy over 3 sessions    Baseline  05/18/18; 05/25/18; 06/01/18    Time  --    Period  --    Status  Achieved   or 9 total visits, for all STGs     SLP SHORT TERM GOAL #2   Title  pt will demo incr'd awareness by telling SLP 3 things that are more difficult now than prior to CVA    Time  1    Period  --    Status  Not Met      SLP SHORT TERM GOAL #3   Title  pt will use a Child psychotherapist with occasional min A to do so    Time  1    Period  --    Status  Not Met       SLP Long Term Goals - 07/21/18 1206      SLP LONG TERM GOAL #1   Title  pt will demo sustained/selective attention to complete 3 12-minute cognitive linguistic tasks, for improved participation in therapy over 3 sessions    Time  1    Period  Weeks   or 17 sessions, for all LTGs   Status  On-going      SLP LONG TERM GOAL #2   Title  pt's father/family will report pt asking about schedule less than prior to ST    Time  1    Period  Weeks    Status  On-going      SLP LONG TERM GOAL #3   Title  pt will demo use of memory notebook/planner for homework, schedule verification, etc in 8 therapy sessions    Baseline  07-21-18    Time  1    Period  Weeks    Status  On-going       Plan - 07/21/18 1159    Clinical Impression Statement  Moderate impairment in attention, memory, and awareness persists. SLP continues to encourage father to initiate work with pt's cognitive apps but it appears he is continueing to rely on pt to initiate this. Pt's attention and initiation cont limited at this time. SLP again stressed to father today about improved consistency at home following pt's schedule to provide best posible opportunity for pt to make progress and father responded that it was difficult for him to keep a schedule. Continue skilled ST for two more visits, and then place pt on hold, to maximize her communication skills (learn how to use  cochlear implant for speech recognition/comprehension), then see pt back in hopes that removal of the barrier of lack of spoken communication will improve pt's prognosis for improvement in cognitive skills. Father needs to be more proactive about initiating pt participation in cognitive-rich tasks at home. ? referral to vocational  rehab.    Speech Therapy Frequency  2x / week    Duration  --   8 weeks or 17 visits   Treatment/Interventions  Cueing hierarchy;SLP instruction and feedback;Compensatory strategies;Functional tasks;Cognitive reorganization;Internal/external aids;Patient/family education    Potential to Achieve Goals  Good    Potential Considerations  Ability to learn/carryover information;Co-morbidities;Severity of impairments    Consulted and Agree with Plan of Care  Patient;Family member/caregiver    Family Member Consulted  father       Patient will benefit from skilled therapeutic intervention in order to improve the following deficits and impairments:   1. Cognitive communication deficit       Problem List Patient Active Problem List   Diagnosis Date Noted  . Sinusitis 03/03/2018  . Right thalamic infarction ([place]) 02/03/2018  . Cerebrovascular accident (CVA) ([place])   . Benign essential HTN   . Tachypnea   . Sensorineural hearing loss (SNHL) of both ears 01/31/2018  . Dyslipidemia 01/31/2018  . Acute ischemic stroke ([place]) 01/30/2018  . Pneumococcal bacteremia   . Pneumococcal meningitis   . Turner syndrome   . Hypothyroidism 01/15/2018  . Generalized anxiety disorder 01/15/2018  . Bicuspid aortic valve 04/06/2013  . HTN (hypertension) 04/06/2013    [facility] ,[place], [place]  07/21/2018, 12:09 PM  [facility] [address] [place], [place], [address] Phone: [phone]   Fax:  [phone]   Name: Emma Martin MRN: [id number] Date of Birth: 1980-05-21

## 2018-07-21 NOTE — Therapy (Signed)
[facility] [address] [place], [place], [address] Phone: [phone]   Fax:  [phone]  Occupational Therapy Treatment  Patient Details  Name: Emma Martin MRN: [id number] Date of Birth: 08/22/1980 Referring Provider (OT): Carol Webb, MD   Encounter Date: 07/21/2018  OT End of Session - 07/21/18 1234    Visit Number  9    Number of Visits  25    Date for OT Re-Evaluation  08/14/18   date extended due to missed visits   Authorization Type  BCBS / Medicaid    Authorization Time Period  BCBS 60 VL (OT/PT combined seen on same day counts as 2 visits)  Medicaid 07/10/18 1 visit approved.    Authorization - Visit Number  1   Medicaid   Authorization - Number of Visits  1    OT Start Time  1203    OT Stop Time  1247    OT Time Calculation (min)  44 min    Activity Tolerance  Patient tolerated treatment well    Behavior During Therapy  WFL for tasks assessed/performed       Past Medical History:  Diagnosis Date  . Bicuspid aortic valve    , mild aortic insufficiency. (No SBE prophylaxis needed)  . Complication of anesthesia   . Dyslipidemia   . Generalized anxiety disorder    , with obsessive-compulsive traits.  . Hypertension   . Hypothyroidism   . Mixed gonadal dysgenesis    , (additional Y-bearing cell line) for which she reportedly underwent bilateral gonadectomy (patient unaware; needs confirmation) due to malignancy potential.  . PONV (postoperative nausea and vomiting)   . Primary amenorrhea    , treated starting at age 14 or 15 with estrogen and progesterone  . Scoliosis    , treated with Harrington rods (1997) for stabilization.  . Seasonal allergic rhinitis   . Short stature    , previously treated with growth hormone age 5-15 years.  . Turner syndrome    , previously followed by pediatric endocrinologist Marsha Davenport, MD at [facility] through her early 20s).    Past Surgical  History:  Procedure Laterality Date  . TEE WITHOUT CARDIOVERSION N/A 01/19/2018   Procedure: TRANSESOPHAGEAL ECHOCARDIOGRAM (TEE);  Surgeon: Skains, Mark C, MD;  Location: [facility];  Service: Cardiovascular;  Laterality: N/A;  . TEE WITHOUT CARDIOVERSION N/A 02/02/2018   Procedure: TRANSESOPHAGEAL ECHOCARDIOGRAM (TEE);  Surgeon: Acharya, Gayatri A, MD;  Location: [facility];  Service: Cardiology;  Laterality: N/A;    There were no vitals filed for this visit.  Subjective Assessment - 07/21/18 1334    Patient is accompanied by:  --   typed instructions/questions for pt in word document during session   Pertinent History  meningitis, CVA 01/30/2018    Currently in Pain?  No/denies    Pain Onset  Today              CLINIC OPERATION CHANGES: Outpatient Neuro Rehab is open at lower capacity following universal masking, social distancing, and patient screening.  The patient's COVID risk of complications score is 2. Functional organization task to generate a list of items pt would need to clean a new apartment, increased time, min omissions Activities for map reading on constant therapy,min difficulty 90% accuracy / min v.c Reinforced importance of pt.participation in IADLs, therapist was given a list of activities to perform at home Reviewed yellow theraband HEP 10-15 reps each, min v.c initially, Pt's father is able   to cue her at home. Environmental scanning in sequential order 80% accuracy.                OT Short Term Goals - 07/13/18 1021      OT SHORT TERM GOAL #1   Title  I with initial HEP    Baseline  needs reminders to perfrom and min v.c    Time  6    Period  Weeks    Status  Achieved   05/25/18:  not currently performing, ST provided pt with exercise checklist and instructed in use     OT SHORT TERM GOAL #2   Title  Pt will perform all basic ADLS modified independently    Baseline  distant supervision    Time  6    Period  Weeks    Status   Achieved   06/18/18 pt reports continued distant supervision.  07/08/18:  met     OT SHORT TERM GOAL #3   Title  Pt will perform cold meal prep modified indpendently and basic cooking task with min A    Baseline  Pt is able to perfrom cold sandwich prep and reheat in microwave, pt has not attempted use of stove yet    Time  6    Period  Weeks    Status  Partially Met   makes a sandwich.  07/08/18:  snack prep and cutting pepper     OT SHORT TERM GOAL #4   Title  Pt will perform basic home management modified I    Baseline  currently pt is only folding laundry, dependent with other home management    Time  6    Period  Weeks    Status  On-going   05/25/18:  only folding clothes.  07/08/18:  also picking up dishes per dad     OT SHORT TERM GOAL #5   Title  Pt will demonstrate improved fine motor coordination as evidenced by decreasing 9 hole peg test by 5 secs for LUE.    Baseline  27.34-RUE, 36.75 -LUE    Time  6    Period  Weeks    Status  Achieved   06/01/18:  R-27.34sec, L-36.75sec     OT SHORT TERM GOAL #6   Title  Further assess visual perceptual skills and set goals prn--see LTGs    Time  6    Period  Weeks    Status  Deferred        OT Long Term Goals - 07/21/18 1235      OT LONG TERM GOAL #1   Title  Pt will perform mod complex home managment modified independently.    Baseline  only folding laundry, dependent for other tasks    Time  12    Period  Weeks    Status  On-going      OT LONG TERM GOAL #2   Title  Pt will perform basic cooking with supervision.    Baseline  Pt has only used microwave, she has not attempted stovetop cooking    Time  12    Period  Weeks    Status  On-going   Pt performed 1x in clinic at a supervision level     OT LONG TERM GOAL #3   Title  Pt will increase bilateral grip strength by 5 lbs for increased functional use during ADLs.    Baseline  RUE 35, LUE 35    Time  12    Period    Weeks    Status  On-going      OT LONG TERM GOAL #4    Title  Pt will demonstrate adequate bilateral UE strength and endurance to retrieve a 3 lbs weight from overhead shelf with left and right UE's individually  without drops x 5 reps in standing modified independently.    Baseline  unable due to decreased UE strength, currently pt is exercising with 1 lb weights    Time  12    Period  Weeks    Status  On-going      OT LONG TERM GOAL #5   Title  Pt will demonstrate abilty to sequence a multi step functional or home management task modified independently.    Baseline  Pt requires assist for sequencing/ organization, and v.c to get started/ initiate task    Time  12    Period  Weeks    Status  On-going      OT LONG TERM GOAL #6   Title  Pt/ family will verbalize understanding cogntive compensation/ adapted strategies for ADLs/IADLs.    Baseline  needs reinforcement, and further education in strategies    Time  12    Period  Weeks    Status  On-going      OT LONG TERM GOAL #7   Title  Pt will perform simple environmental scanning with at least 95% accuracy for improved attention and safety.    Baseline  90%    Time  12    Period  Weeks    Status  On-going              Patient will benefit from skilled therapeutic intervention in order to improve the following deficits and impairments:           Visit Diagnosis: No diagnosis found.    Problem List Patient Active Problem List   Diagnosis Date Noted  . Sinusitis 03/03/2018  . Right thalamic infarction ([place]) 02/03/2018  . Cerebrovascular accident (CVA) ([place])   . Benign essential HTN   . Tachypnea   . Sensorineural hearing loss (SNHL) of both ears 01/31/2018  . Dyslipidemia 01/31/2018  . Acute ischemic stroke ([place]) 01/30/2018  . Pneumococcal bacteremia   . Pneumococcal meningitis   . Turner syndrome   . Hypothyroidism 01/15/2018  . Generalized anxiety disorder 01/15/2018  . Bicuspid aortic valve 04/06/2013  . HTN (hypertension) 04/06/2013     Sutton Plake 07/21/2018, 1:36 PM  [facility] [address] [place], [place], [address] Phone: [phone]   Fax:  [phone]  Name: Emma Martin MRN: [id number] Date of Birth: 01-24-1980

## 2018-07-21 NOTE — Patient Instructions (Signed)
  Please bring your booklet on cochlear implant next session for you to read and highlight.

## 2018-07-21 NOTE — Therapy (Signed)
[facility] [address] [place], [place], [address] Phone: [phone]   Fax:  [phone]  Physical Therapy Treatment  Patient Details  Name: Emma Martin MRN: [id number] Date of Birth: [birth year] Referring Provider (PT): Maurice Small, MD  CLINIC OPERATION CHANGES: Outpatient Neuro Rehab is open at lower capacity following universal masking, social distancing, and patient screening.  The patient's COVID risk of complications score is 2.   Encounter Date: 07/21/2018  PT End of Session - 07/21/18 1107    Visit Number  12    Number of Visits  17   per recert 05/07/4079-4G/YJ for 8 weeks   Date for PT Re-Evaluation  09/16/18    Authorization Type  BCBS, Medicaid (4th, 5th visits are telehealth); medicaid approval 10PT visits 05/26/2018-08/03/2018    Authorization Time Period  05/26/2018-08/03/2018    Authorization - Visit Number  5    Authorization - Number of Visits  10   Medicaid   Equipment Utilized During Treatment  --   used computer (microsoft word) to type conversation with patient   Activity Tolerance  Patient tolerated treatment well    Behavior During Therapy  [facility] for tasks assessed/performed       Past Medical History:  Diagnosis Date  . Bicuspid aortic valve    , mild aortic insufficiency. (No SBE prophylaxis needed)  . Complication of anesthesia   . Dyslipidemia   . Generalized anxiety disorder    , with obsessive-compulsive traits.  . Hypertension   . Hypothyroidism   . Mixed gonadal dysgenesis    , (additional Y-bearing cell line) for which she reportedly underwent bilateral gonadectomy (patient unaware; needs confirmation) due to malignancy potential.  . PONV (postoperative nausea and vomiting)   . Primary amenorrhea    , treated starting at age 66 or 24 with estrogen and progesterone  . Scoliosis    , treated with Harrington rods (1997) for stabilization.  . Seasonal allergic rhinitis   . Short  stature    , previously treated with growth hormone age 17-15 years.  Radford Pax syndrome    , previously followed by pediatric endocrinologist Freddy Jaksch, MD at [facility] through her early 51s).    Past Surgical History:  Procedure Laterality Date  . TEE WITHOUT CARDIOVERSION N/A 01/19/2018   Procedure: TRANSESOPHAGEAL ECHOCARDIOGRAM (TEE);  Surgeon: Jerline Pain, MD;  Location: [facility] ENDOSCOPY;  Service: Cardiovascular;  Laterality: N/A;  . TEE WITHOUT CARDIOVERSION N/A 02/02/2018   Procedure: TRANSESOPHAGEAL ECHOCARDIOGRAM (TEE);  Surgeon: Elouise Munroe, MD;  Location: [facility];  Service: Cardiology;  Laterality: N/A;    There were no vitals filed for this visit.  Subjective Assessment - 07/21/18 1104    Subjective  The patient had cochlear implant on 07/14/18. Some pain in the ear, but not too bad "typical post surgery discomfort."  She continues to use cane for longer distances.    Patient is accompained by:  Family member   father   Patient Stated Goals  Want to feel stronger moving around, to not depend on walker.    Currently in Pain?  No/denies         [facility] PT Assessment - 07/21/18 1138      Transfers   Five time sit to stand comments   14.52 seconds      Standardized Balance Assessment   Standardized Balance Assessment  Dynamic Gait Index      Dynamic Gait Index   Level Surface  Mild Impairment  Change in Gait Speed  Normal    Gait with Horizontal Head Turns  Mild Impairment    Gait with Vertical Head Turns  Mild Impairment    Gait and Pivot Turn  Normal    Step Over Obstacle  Mild Impairment    Step Around Obstacles  Normal    Steps  Mild Impairment    Total Score  19    DGI comment:  Improved from 13/24.      Timed Up and Go Test   TUG  --   12.41 seconds                  [facility] Adult PT Treatment/Exercise - 07/21/18 1138      Ambulation/Gait   Ambulation/Gait  Yes    Ambulation/Gait Assistance  6: Modified independent  (Device/Increase time);5: Supervision;4: Min guard    Ambulation/Gait Assistance Details  Patient walks mod indep on indoor surfaces without device today.  PT provided close supervision to CGA on grassy surfaces.  Also performed dynamic gait indoors with ball toss for multi-tasking.    Ambulation Distance (Feet)  500 Feet    Assistive device  None    Gait Pattern  Step-through pattern;Decreased stride length;Wide base of support;Decreased arm swing - right;Decreased arm swing - left;Decreased step length - left;Decreased step length - right    Ambulation Surface  Level;Indoor    Stairs  Yes    Stairs Assistance  5: Supervision    Stairs Assistance Details (indicate cue type and reason)  The patient is performing reciprocal gait with one handrail on steps with supervision.    Number of Stairs  12      Self-Care   Self-Care  Other Self-Care Comments    Other Self-Care Comments   PT and patient discussed focus of ongoing therapy.  We will continue to emphasize balance and dynamic gait,       Therapeutic Activites    Therapeutic Activities  Other Therapeutic Activities    Other Therapeutic Activities  Bending to pick up objects from the floor and from under mat tables with supervision.      Neuro Re-ed    Neuro Re-ed Details   Compliant surface standing with head motion and eyes closed.  Stepping down/up from compliant surface.                 PT Short Term Goals - 07/21/18 1108      PT SHORT TERM GOAL #1   Title  Pt will perform HEP with supervision, including household tasks and mobility, for improved balance, strength and gait.  TARGET 07/16/2018    Baseline  per report, doing HEP 2 times/day.    Time  4    Period  Weeks    Status  Achieved    Target Date  07/16/18      PT SHORT TERM GOAL #2   Title  Pt will improve 5x sit<>stand independently, to less than or equal to 13 seconds to demonstrate improved functional strength.    Baseline  14.52 on 07/21/2018.  Improved, but not  to goal level.    Time  4    Period  Weeks    Status  Partially Met    Target Date  07/16/18      PT SHORT TERM GOAL #3   Title  Pt will improve DGI to at least 16/24 for decreased fall risk.    Baseline  19/24    Time  4  Period  Weeks    Status  Achieved    Target Date  07/16/18      PT SHORT TERM GOAL #4   Title  Pt will improve TUG score to less than or equal to 13.5 seconds for decreased fall risk.    Baseline  12.41 seconds.    Time  4    Period  Weeks    Status  Achieved    Target Date  07/16/18      PT SHORT TERM GOAL #5   Title  Pt will negotiate at least 4 steps, modified independently, alternating step pattern for improved functional mobility in home.    Baseline  Able to demonstrate with one handrail and reciprocal pattern.    Time  4    Period  Weeks    Status  Achieved    Target Date  07/16/18      PT SHORT TERM GOAL #6   Title       Baseline           PT Long Term Goals - 07/21/18 1129      PT LONG TERM GOAL #1   Title  Pt will verbalize plans for continued community fitness post d/c from PT to maximize gains made in PT.  TARGET 08/13/2018    Baseline       Time  8    Period  Weeks    Status  Revised      PT LONG TERM GOAL #2   Title  Pt will perform squats to pick up objects from floor, 4 of 5 trials with no UE support, to demo improved functional strength and balance.    Baseline  Met on 07/21/2018.    Time  8    Period  Weeks    Status  Achieved      PT LONG TERM GOAL #3   Title  Pt will perform SLS at least 3 sec each leg for improved obstacle and stair negotiation.    Baseline  <1 sec 06/18/2018    Time  8    Period  Weeks    Status  Revised      PT LONG TERM GOAL #4   Title  Pt will improve DGI score to at least 20/24 for decreased fall risk.    Baseline  13/24 06/18/2018    Time  8    Period  Weeks    Status  New      PT LONG TERM GOAL #5   Title  Pt will ambulate independently, 1000 ft, indoor and outdoor surfaces, no LOB for  improved community gait.    Baseline  household distances no device 06/18/2018    Time  8    Period  Weeks    Status  On-going            Plan - 07/21/18 1257    Clinical Impression Statement  The patient met all STGs except for 5 time sit<>stand.  She also met LTG for bending to pick up objects.  We discussed her progress and need for further services.  She feels that further therapy for balance, walking on unlevel surfaces will be beneficial.  PT to continue to remaining LTGs.  Discussed potential for d/c prior to end of currently scheduled visits (LTG due 7/24, but scheduled to 7/29).    PT Treatment/Interventions  ADLs/Self Care Home Management;DME Instruction;Gait training;Functional mobility training;Stair training;Therapeutic activities;Therapeutic exercise;Patient/family education;Neuromuscular re-education;Balance training    PT Next Visit Plan  outdoor ambulation, single leg stance, jogging/high level balance.  bending tasks, dynamic gait.    Consulted and Agree with Plan of Care  Patient;Family member/caregiver    Family Member Consulted  father       Patient will benefit from skilled therapeutic intervention in order to improve the following deficits and impairments:  Abnormal gait, Decreased balance, Decreased mobility, Decreased strength, Difficulty walking, Postural dysfunction  Visit Diagnosis: 1. Muscle weakness (generalized)   2. Unsteadiness on feet   3. Other abnormalities of gait and mobility        Problem List Patient Active Problem List   Diagnosis Date Noted  . Sinusitis 03/03/2018  . Right thalamic infarction ([place]) 02/03/2018  . Cerebrovascular accident (CVA) ([place])   . Benign essential HTN   . Tachypnea   . Sensorineural hearing loss (SNHL) of both ears 01/31/2018  . Dyslipidemia 01/31/2018  . Acute ischemic stroke ([place]) 01/30/2018  . Pneumococcal bacteremia   . Pneumococcal meningitis   . Turner syndrome   . Hypothyroidism 01/15/2018  .  Generalized anxiety disorder 01/15/2018  . Bicuspid aortic valve 04/06/2013  . HTN (hypertension) 04/06/2013    Abby Tucholski, PT 07/21/2018, 1:31 PM  [facility] [address] [place], [place], [address] Phone: [phone]   Fax:  [phone]  Name: Emma Martin MRN: [id number] Date of Birth: 08-15-1980

## 2018-07-28 ENCOUNTER — Other Ambulatory Visit: Payer: Self-pay

## 2018-07-28 ENCOUNTER — Ambulatory Visit: Payer: BC Managed Care – PPO

## 2018-07-28 ENCOUNTER — Ambulatory Visit: Payer: BC Managed Care – PPO | Admitting: Occupational Therapy

## 2018-07-28 ENCOUNTER — Ambulatory Visit: Payer: BC Managed Care – PPO | Admitting: Rehabilitative and Restorative Service Providers"

## 2018-07-28 DIAGNOSIS — R41841 Cognitive communication deficit: Secondary | ICD-10-CM | POA: Diagnosis not present

## 2018-07-28 DIAGNOSIS — R41842 Visuospatial deficit: Secondary | ICD-10-CM | POA: Diagnosis not present

## 2018-07-28 DIAGNOSIS — M6281 Muscle weakness (generalized): Secondary | ICD-10-CM

## 2018-07-28 DIAGNOSIS — I69315 Cognitive social or emotional deficit following cerebral infarction: Secondary | ICD-10-CM | POA: Diagnosis not present

## 2018-07-28 DIAGNOSIS — R41844 Frontal lobe and executive function deficit: Secondary | ICD-10-CM

## 2018-07-28 DIAGNOSIS — R41840 Attention and concentration deficit: Secondary | ICD-10-CM

## 2018-07-28 DIAGNOSIS — R2681 Unsteadiness on feet: Secondary | ICD-10-CM

## 2018-07-28 DIAGNOSIS — R278 Other lack of coordination: Secondary | ICD-10-CM

## 2018-07-28 DIAGNOSIS — R2689 Other abnormalities of gait and mobility: Secondary | ICD-10-CM

## 2018-07-28 NOTE — Therapy (Signed)
[facility] [address] [place], [place], [address] Phone: [phone]   Fax:  [phone]  Speech Language Pathology Treatment  Patient Details  Name: Emma Martin MRN: [id number] Date of Birth: [birth year] Referring Provider (SLP): Maurice Small, MD   Encounter Date: 07/28/2018  End of Session - 07/28/18 1302    Visit Number  12    Number of Visits  17    Date for SLP Re-Evaluation  08/25/18    Authorization Type  end date 08-04-18 medicaid    SLP Start Time  1003    SLP Stop Time   1045    SLP Time Calculation (min)  42 min    Activity Tolerance  Patient tolerated treatment well       Past Medical History:  Diagnosis Date  . Bicuspid aortic valve    , mild aortic insufficiency. (No SBE prophylaxis needed)  . Complication of anesthesia   . Dyslipidemia   . Generalized anxiety disorder    , with obsessive-compulsive traits.  . Hypertension   . Hypothyroidism   . Mixed gonadal dysgenesis    , (additional Y-bearing cell line) for which she reportedly underwent bilateral gonadectomy (patient unaware; needs confirmation) due to malignancy potential.  . PONV (postoperative nausea and vomiting)   . Primary amenorrhea    , treated starting at age 85 or 34 with estrogen and progesterone  . Scoliosis    , treated with Harrington rods (1997) for stabilization.  . Seasonal allergic rhinitis   . Short stature    , previously treated with growth hormone age 53-15 years.  Radford Pax syndrome    , previously followed by pediatric endocrinologist Freddy Jaksch, MD at [facility] through her early 34s).    Past Surgical History:  Procedure Laterality Date  . TEE WITHOUT CARDIOVERSION N/A 01/19/2018   Procedure: TRANSESOPHAGEAL ECHOCARDIOGRAM (TEE);  Surgeon: Jerline Pain, MD;  Location: [facility] ENDOSCOPY;  Service: Cardiovascular;  Laterality: N/A;  . TEE WITHOUT CARDIOVERSION N/A 02/02/2018   Procedure:  TRANSESOPHAGEAL ECHOCARDIOGRAM (TEE);  Surgeon: Elouise Munroe, MD;  Location: [facility];  Service: Cardiology;  Laterality: N/A;    There were no vitals filed for this visit.  Subjective Assessment - 07/28/18 1001    Subjective  Father brought in book about cochlear implant today to tx, as asked.    Patient is accompained by:  Family member   fater   Currently in Pain?  No/denies            ADULT SLP TREATMENT - 07/28/18 1042      General Information   Behavior/Cognition  Alert;Cooperative;Pleasant mood;Distractible      Cognitive-Linquistic Treatment   Treatment focused on  Cognition    Skilled Treatment  Pt did not recall reviewing timeline of cochelar implant info father stated he did with pt. Pt req'd occasional cues for initiation and to find pertinent info re: her cochlear implant. Pt stated she did not have any questions to ask MD/audiologist however when presented with some FAQ pt highlighted ~7 of them.  With delay for processing, pt answered SLP questions about organization and planning of question asking to MD. Pt used her notebook, with a cue, to look at what therapy she has next. She req'd cues to find the correct section in which to look at her schedule. Pt stated she thought it was best to take a break for her cochlear implant to become easier to use (training to use the device  and modification to the device) prior to resumption of ST. Father agrees. One more session prior to placing pt on hold.       Assessment / Recommendations / Plan   Plan  Continue with current plan of care      Progression Toward Goals   Progression toward goals  --   goals put on hold with pt ST after next session        SLP Short Term Goals - 06/11/18 1152      SLP SHORT TERM GOAL #1   Title  pt will demo sustained attention to complete 4 7-minute cognitive linguistic tasks, for improved participation in therapy over 3 sessions    Baseline  05/18/18; 05/25/18; 06/01/18    Time  --     Period  --    Status  Achieved   or 9 total visits, for all STGs     SLP SHORT TERM GOAL #2   Title  pt will demo incr'd awareness by telling SLP 3 things that are more difficult now than prior to CVA    Time  1    Period  --    Status  Not Met      SLP SHORT TERM GOAL #3   Title  pt will use a Child psychotherapist with occasional min A to do so    Time  1    Period  --    Status  Not Met       SLP Long Term Goals - 07/28/18 1524      SLP LONG TERM GOAL #1   Title  pt will demo sustained/selective attention to complete 3 12-minute cognitive linguistic tasks, for improved participation in therapy over 3 sessions    Time  1    Period  Weeks   or 17 sessions, for all LTGs   Status  On-going      SLP LONG TERM GOAL #2   Title  pt's father/family will report pt asking about schedule less than prior to ST    Time  1    Period  Weeks    Status  On-going      SLP LONG TERM GOAL #3   Title  pt will demo use of memory notebook/planner for homework, schedule verification, etc in 8 therapy sessions    Baseline  07-21-18    Time  1    Period  Weeks    Status  On-going       Plan - 07/28/18 1302    Clinical Impression Statement  Moderate impairment in attention, memory, and awareness persists. SLP continues to encourage father to initiate that pt works with cognitive apps and other cognitive tasks at home. Pt's attention and initiation cont limited at this time.  Pt and father agree to continue skilled ST for one more visit, and then place pt on hold, to maximize her communication skills (learn how to use cochlear implant for speech recognition/comprehension), then see pt back in hopes that removal of the barrier of lack of spoken communication will improve pt's prognosis for improvement in cognitive skills. Father needs to be more proactive about initiating pt participation in cognitive-rich tasks at home. ? referral to vocational rehab.    Speech Therapy Frequency   2x / week    Duration  --   8 weeks or 17 visits   Treatment/Interventions  Cueing hierarchy;SLP instruction and feedback;Compensatory strategies;Functional tasks;Cognitive reorganization;Internal/external aids;Patient/family education    Potential to Achieve Goals  Good  Potential Considerations  Ability to learn/carryover information;Co-morbidities;Severity of impairments    Consulted and Agree with Plan of Care  Patient;Family member/caregiver    Family Member Consulted  father       Patient will benefit from skilled therapeutic intervention in order to improve the following deficits and impairments:   1. Cognitive communication deficit       Problem List Patient Active Problem List   Diagnosis Date Noted  . Sinusitis 03/03/2018  . Right thalamic infarction ([place]) 02/03/2018  . Cerebrovascular accident (CVA) ([place])   . Benign essential HTN   . Tachypnea   . Sensorineural hearing loss (SNHL) of both ears 01/31/2018  . Dyslipidemia 01/31/2018  . Acute ischemic stroke ([place]) 01/30/2018  . Pneumococcal bacteremia   . Pneumococcal meningitis   . Turner syndrome   . Hypothyroidism 01/15/2018  . Generalized anxiety disorder 01/15/2018  . Bicuspid aortic valve 04/06/2013  . HTN (hypertension) 04/06/2013    [facility] ,[place], [place]  07/28/2018, 3:27 PM  [facility] [address] [place], [place], [address] Phone: [phone]   Fax:  [phone]   Name: Emma Martin MRN: [id number] Date of Birth: Sep 17, 1980

## 2018-07-28 NOTE — Therapy (Signed)
[facility] [address] [place], [place], [address] Phone: [phone]   Fax:  [phone]  Physical Therapy Treatment  Patient Details  Name: Emma Martin MRN: [id number] Date of Birth: 10/25/1980 Referring Provider (PT): Maurice Small, MD  CLINIC OPERATION CHANGES: Outpatient [facility] Rehab is open at lower capacity following universal masking, social distancing, and patient screening.  The patient's COVID risk of complications score is 2.  Encounter Date: 07/28/2018  PT End of Session - 07/28/18 1153    Visit Number  13    Number of Visits  17   per recert [id number]/[id number] for 8 weeks   Date for PT Re-Evaluation  09/16/18    Authorization Type  BCBS, Medicaid (4th, 5th visits are telehealth); medicaid approval 10PT visits 05/26/2018-08/03/2018    Authorization Time Period  05/26/2018-08/03/2018    Authorization - Visit Number  6    Authorization - Number of Visits  10   Medicaid   Equipment Utilized During Treatment  --   computer to communicate- PT types in Word program   Activity Tolerance  Patient tolerated treatment well    Behavior During Therapy  [facility] for tasks assessed/performed       Past Medical History:  Diagnosis Date  . Bicuspid aortic valve    , mild aortic insufficiency. (No SBE prophylaxis needed)  . Complication of anesthesia   . Dyslipidemia   . Generalized anxiety disorder    , with obsessive-compulsive traits.  . Hypertension   . Hypothyroidism   . Mixed gonadal dysgenesis    , (additional Y-bearing cell line) for which she reportedly underwent bilateral gonadectomy (patient unaware; needs confirmation) due to malignancy potential.  . PONV (postoperative nausea and vomiting)   . Primary amenorrhea    , treated starting at age 4 or 8 with estrogen and progesterone  . Scoliosis    , treated with Harrington rods (1997) for stabilization.  . Seasonal allergic rhinitis   . Short stature    ,  previously treated with growth hormone age 73-15 years.  Radford Pax syndrome    , previously followed by pediatric endocrinologist Freddy Jaksch, MD at [facility] through her early 43s).    Past Surgical History:  Procedure Laterality Date  . TEE WITHOUT CARDIOVERSION N/A 01/19/2018   Procedure: TRANSESOPHAGEAL ECHOCARDIOGRAM (TEE);  Surgeon: Jerline Pain, MD;  Location: [facility] ENDOSCOPY;  Service: Cardiovascular;  Laterality: N/A;  . TEE WITHOUT CARDIOVERSION N/A 02/02/2018   Procedure: TRANSESOPHAGEAL ECHOCARDIOGRAM (TEE);  Surgeon: Elouise Munroe, MD;  Location: [facility];  Service: Cardiology;  Laterality: N/A;    There were no vitals filed for this visit.  Subjective Assessment - 07/28/18 1108    Subjective  Cochlear implant activation scheduled for 07/20/2018.                       [facility] Adult PT Treatment/Exercise - 07/28/18 1121      Ambulation/Gait   Ambulation/Gait  Yes    Ambulation/Gait Assistance  6: Modified independent (Device/Increase time);4: Min guard    Ambulation/Gait Assistance Details  Patient walks in the clinic without her cane (Dad carries) mod indep.  Outdoor ambulation, PT provided CGA as patient negotiated grass, curbs, hills, etc.    Ambulation Distance (Feet)  1000 Feet    Assistive device  None    Gait Pattern  Step-through pattern;Decreased stride length;Wide base of support;Decreased arm swing - right;Decreased arm swing - left;Decreased step length - left;Decreased step  length - right    Ambulation Surface  Level;Indoor;Outdoor;Unlevel;Grass    Stairs  Yes    Stairs Assistance  6: Modified independent (Device/Increase time);5: Supervision    Stairs Assistance Details (indicate cue type and reason)  Mod indep with handrails and reciprocal pattern.  Supervision without handrails and reciprocal pattern.    Number of Stairs  8    Gait Comments  Dynamic gait activities performing horizontal head motion, vertical head motion, tandem  gait.      Neuro Re-ed    Neuro Re-ed Details   Obstacle course negotiatoin walking through agility ladder for longer stride, marching wide<>narrow through ladder, sidestepping through ladder, figure 8 walking around cones and walking from target to target for longer stride length.  Negotiated tight spaces between computer tables in the gym with close supervision.  PT provided CGA during agility ladder.  Jumping in place with bilateral LEs, then going wide<>narrow x 5 reps.   Picking up objects from the floor for bending and recommended patient unload the dishwasher at home to continue working on this task (currently she does utensils and her father moves the basket out of the dishwaster to the countertop).             PT Education - 07/28/18 1152    Education Details  Discussed d/c after 2 more visits and removed visit scheduled on 7/29.    Person(s) Educated  Patient;Parent(s);Caregiver(s)    Methods  Explanation    Comprehension  Verbalized understanding       PT Short Term Goals - 07/21/18 1108      PT SHORT TERM GOAL #1   Title  Pt will perform HEP with supervision, including household tasks and mobility, for improved balance, strength and gait.  TARGET 07/16/2018    Baseline  per report, doing HEP 2 times/day.    Time  4    Period  Weeks    Status  Achieved    Target Date  07/16/18      PT SHORT TERM GOAL #2   Title  Pt will improve 5x sit<>stand independently, to less than or equal to 13 seconds to demonstrate improved functional strength.    Baseline  14.52 on 07/21/2018.  Improved, but not to goal level.    Time  4    Period  Weeks    Status  Partially Met    Target Date  07/16/18      PT SHORT TERM GOAL #3   Title  Pt will improve DGI to at least 16/24 for decreased fall risk.    Baseline  19/24    Time  4    Period  Weeks    Status  Achieved    Target Date  07/16/18      PT SHORT TERM GOAL #4   Title  Pt will improve TUG score to less than or equal to 13.5  seconds for decreased fall risk.    Baseline  12.41 seconds.    Time  4    Period  Weeks    Status  Achieved    Target Date  07/16/18      PT SHORT TERM GOAL #5   Title  Pt will negotiate at least 4 steps, modified independently, alternating step pattern for improved functional mobility in home.    Baseline  Able to demonstrate with one handrail and reciprocal pattern.    Time  4    Period  Weeks    Status  Achieved  Target Date  07/16/18      PT SHORT TERM GOAL #6   Title       Baseline           PT Long Term Goals - 07/28/18 1122      PT LONG TERM GOAL #1   Title  Pt will verbalize plans for continued community fitness post d/c from PT to maximize gains made in PT.  TARGET 08/13/2018    Baseline       Time  8    Period  Weeks    Status  On-going      PT LONG TERM GOAL #2   Title  Pt will perform squats to pick up objects from floor, 4 of 5 trials with no UE support, to demo improved functional strength and balance.    Baseline  Met on 07/21/2018.    Time  8    Period  Weeks    Status  Achieved      PT LONG TERM GOAL #3   Title  Pt will perform SLS at least 3 sec each leg for improved obstacle and stair negotiation.    Baseline  Met on 07/28/2018    Time  8    Period  Weeks    Status  Achieved      PT LONG TERM GOAL #4   Title  Pt will improve DGI score to at least 20/24 for decreased fall risk.    Baseline  13/24 06/18/2018    Time  8    Period  Weeks    Status  On-going      PT LONG TERM GOAL #5   Title  Pt will ambulate independently, 1000 ft, indoor and outdoor surfaces, no LOB for improved community gait.    Baseline  household distances no device 06/18/2018    Time  8    Period  Weeks    Status  On-going            Plan - 07/28/18 1156    Clinical Impression Statement  The patient has met 2 LTGs meeting SLS goal today. When on grassy surfaces and during dynamic tasks, PT continuing to provide close supervision due to occasional foot scuffing and  loss of balance (although patient can self recover).  PT to finish  next 2 sessions focusing on:  review HEP in medbridge, community gait with unlevel surface negotiation, check remaining LTGs.    PT Treatment/Interventions  ADLs/Self Care Home Management;DME Instruction;Gait training;Functional mobility training;Stair training;Therapeutic activities;Therapeutic exercise;Patient/family education;Neuromuscular re-education;Balance training    PT Next Visit Plan  Check DGI, outdoor gait activities, bending to pick up objects from the floor, dynamic gait activities.  REVIEW HEP in next 2 sessions.    PT Home Exercise Plan  Started new with medbridge code  [id number]    Consulted and Agree with Plan of Care  Family member/caregiver;Patient    Family Member Consulted  father       Patient will benefit from skilled therapeutic intervention in order to improve the following deficits and impairments:  Abnormal gait, Decreased balance, Decreased mobility, Decreased strength, Difficulty walking, Postural dysfunction  Visit Diagnosis: 1. Muscle weakness (generalized)   2. Unsteadiness on feet   3. Other abnormalities of gait and mobility        Problem List Patient Active Problem List   Diagnosis Date Noted  . Sinusitis 03/03/2018  . Right thalamic infarction ([place]) 02/03/2018  . Cerebrovascular accident (CVA) ([place])   . Benign  essential HTN   . Tachypnea   . Sensorineural hearing loss (SNHL) of both ears 01/31/2018  . Dyslipidemia 01/31/2018  . Acute ischemic stroke ([place]) 01/30/2018  . Pneumococcal bacteremia   . Pneumococcal meningitis   . Turner syndrome   . Hypothyroidism 01/15/2018  . Generalized anxiety disorder 01/15/2018  . Bicuspid aortic valve 04/06/2013  . HTN (hypertension) 04/06/2013    Ashlynne Shetterly , PT 07/28/2018, 11:58 AM  [facility] [address] [place], [place], [address] Phone: [phone]   Fax:   [phone]  Name: Emma Martin MRN: [id number] Date of Birth: 11-05-1980

## 2018-07-28 NOTE — Therapy (Signed)
[facility] [address] [place], [place], [address] Phone: [phone]   Fax:  [phone]  Occupational Therapy Treatment  Patient Details  Name: Emma Martin MRN: [id number] Date of Birth: 28-Sep-1980 Referring Provider (OT): Maurice Small, MD   Encounter Date: 07/28/2018  OT End of Session - 07/28/18 1623    Visit Number  9    Number of Visits  25    Date for OT Re-Evaluation  08/14/18   date extended due to missed visits   Authorization Type  BCBS / Medicaid    Authorization Time Period  BCBS 60 VL (OT/PT combined seen on same day counts as 2 visits)  Medicaid 07/10/18 1 visit approved.    Authorization - Visit Number  1   Medicaid   Authorization - Number of Visits  1    OT Start Time  1203    OT Stop Time  1245    OT Time Calculation (min)  42 min    Activity Tolerance  Patient tolerated treatment well    Behavior During Therapy  WFL for tasks assessed/performed       Past Medical History:  Diagnosis Date  . Bicuspid aortic valve    , mild aortic insufficiency. (No SBE prophylaxis needed)  . Complication of anesthesia   . Dyslipidemia   . Generalized anxiety disorder    , with obsessive-compulsive traits.  . Hypertension   . Hypothyroidism   . Mixed gonadal dysgenesis    , (additional Y-bearing cell line) for which she reportedly underwent bilateral gonadectomy (patient unaware; needs confirmation) due to malignancy potential.  . PONV (postoperative nausea and vomiting)   . Primary amenorrhea    , treated starting at age 70 or 46 with estrogen and progesterone  . Scoliosis    , treated with Harrington rods (1997) for stabilization.  . Seasonal allergic rhinitis   . Short stature    , previously treated with growth hormone age 61-15 years.  Radford Pax syndrome    , previously followed by pediatric endocrinologist Freddy Jaksch, MD at [facility] through her early 60s).    Past Surgical  History:  Procedure Laterality Date  . TEE WITHOUT CARDIOVERSION N/A 01/19/2018   Procedure: TRANSESOPHAGEAL ECHOCARDIOGRAM (TEE);  Surgeon: Jerline Pain, MD;  Location: [facility] ENDOSCOPY;  Service: Cardiovascular;  Laterality: N/A;  . TEE WITHOUT CARDIOVERSION N/A 02/02/2018   Procedure: TRANSESOPHAGEAL ECHOCARDIOGRAM (TEE);  Surgeon: Elouise Munroe, MD;  Location: [facility];  Service: Cardiology;  Laterality: N/A;    There were no vitals filed for this visit.  Subjective Assessment - 07/28/18 1623    Patient is accompanied by:  --   typed instructions/questions for pt in word document during session   Pertinent History  meningitis, CVA 01/30/2018    Currently in Pain?  No/denies    Pain Onset  Today             CLINIC OPERATION CHANGES: Outpatient Neuro Rehab is open at lower capacity following universal masking, social distancing, and patient screening.  The patient's COVID risk of complications score is 2. Functional organization task to organize a list of items into a daily schedule. Task was more complex than previous functional organization tasks and mod v.c and pt made multiple omissions. Pt was instructed to complete task as homework. Reinforced importance of pt.participation in IADLs, Upgraded to red theraband HEP 10-15 reps each, min v.c initially, Pt's father is able to cue her at home. Functional reaching  to simulate unloading silverware from dishwasher, pt retrieved clothespins from the floor with LUE with maintaining UE support with RUE, to place on overhead antennae. Gripper set at level 2 to pick up 1 inch blocks with left and tright UE's for sustained grip, min difficulty/ v.c              OT Education - 07/28/18 1626    Education Details  upgraded red theraband HEP    Person(s) Educated  Patient;Parent(s)    Methods  Explanation;Demonstration;Verbal cues    Comprehension  Verbalized understanding;Returned demonstration;Verbal cues required        OT Short Term Goals - 07/13/18 1021      OT SHORT TERM GOAL #1   Title  I with initial HEP    Baseline  needs reminders to perfrom and min v.c    Time  6    Period  Weeks    Status  Achieved   05/25/18:  not currently performing, ST provided pt with exercise checklist and instructed in use     OT SHORT TERM GOAL #2   Title  Pt will perform all basic ADLS modified independently    Baseline  distant supervision    Time  6    Period  Weeks    Status  Achieved   06/18/18 pt reports continued distant supervision.  07/08/18:  met     OT SHORT TERM GOAL #3   Title  Pt will perform cold meal prep modified indpendently and basic cooking task with min A    Baseline  Pt is able to perfrom cold sandwich prep and reheat in microwave, pt has not attempted use of stove yet    Time  6    Period  Weeks    Status  Partially Met   makes a sandwich.  07/08/18:  snack prep and cutting pepper     OT SHORT TERM GOAL #4   Title  Pt will perform basic home management modified I    Baseline  currently pt is only folding laundry, dependent with other home management    Time  6    Period  Weeks    Status  On-going   05/25/18:  only folding clothes.  07/08/18:  also picking up dishes per dad     OT SHORT TERM GOAL #5   Title  Pt will demonstrate improved fine motor coordination as evidenced by decreasing 9 hole peg test by 5 secs for LUE.    Baseline  27.34-RUE, 36.75 -LUE    Time  6    Period  Weeks    Status  Achieved   06/01/18:  R-27.34sec, L-36.75sec     OT SHORT TERM GOAL #6   Title  Further assess visual perceptual skills and set goals prn--see LTGs    Time  6    Period  Weeks    Status  Deferred        OT Long Term Goals - 07/28/18 1221      OT LONG TERM GOAL #1   Title  Pt will perform mod complex home managment modified independently.    Baseline  only folding laundry, dependent for other tasks    Time  12    Period  Weeks    Status  On-going      OT LONG TERM GOAL #2   Title   Pt will perform basic cooking with supervision.    Baseline  Pt has only used microwave, she has not  attempted stovetop cooking    Time  12    Period  Weeks    Status  On-going   Pt performed 1x in clinic at a supervision level     OT LONG TERM GOAL #3   Title  Pt will increase bilateral grip strength by 5 lbs for increased functional use during ADLs.    Baseline  RUE 35, LUE 35    Time  12    Period  Weeks    Status  On-going      OT LONG TERM GOAL #4   Title  Pt will demonstrate adequate bilateral UE strength and endurance to retrieve a 3 lbs weight from overhead shelf with left and right UE's individually  without drops x 5 reps in standing modified independently.    Baseline  unable due to decreased UE strength, currently pt is exercising with 1 lb weights    Time  12    Period  Weeks    Status  On-going      OT LONG TERM GOAL #5   Title  Pt will demonstrate abilty to sequence a multi step functional or home management task modified independently.    Baseline  Pt requires assist for sequencing/ organization, and v.c to get started/ initiate task    Time  12    Period  Weeks    Status  On-going      OT LONG TERM GOAL #6   Title  Pt/ family will verbalize understanding cogntive compensation/ adapted strategies for ADLs/IADLs.    Baseline  needs reinforcement, and further education in strategies    Time  12    Period  Weeks    Status  On-going      OT LONG TERM GOAL #7   Title  Pt will perform simple environmental scanning with at least 95% accuracy for improved attention and safety.    Baseline  90%    Time  12    Period  Weeks    Status  On-going            Plan - 07/28/18 1624    Clinical Impression Statement  Pt is progressing towards goals. She still experiences difficulty with more complex organizational tasks, requiring moderate v.c to attend to all details.    Occupational performance deficits (Please refer to evaluation for details):   ADL's;IADL's;Education;Social Participation;Leisure;Play;Work    Statistician;Safety Awareness;Sequencing;Energy/Drive;Perception    Rehab Potential  Fair    OT Frequency  2x / week    OT Duration  12 weeks    OT Treatment/Interventions  Self-care/ADL training;Fluidtherapy;Therapeutic exercise;Energy conservation;Gait Training;Functional Furniture conservator/restorer;Therapeutic activities;Visual/perceptual remediation/compensation;Balance training;Patient/family education;Cognitive remediation/compensation;Passive range of motion;Manual Therapy;DME and/or AE instruction;Neuromuscular education;Paraffin;Ultrasound;Moist Heat;Aquatic Therapy    Plan  continue simple home management/ functional activities.    Consulted and Agree with Plan of Care  Patient       Patient will benefit from skilled therapeutic intervention in order to improve the following deficits and impairments:     Cognitive Skills: Attention, Problem Solve, Memory, Safety Awareness, Sequencing, Energy/Drive, Perception     Visit Diagnosis: 1. Frontal lobe and executive function deficit   2. Attention and concentration deficit   3. Visuospatial deficit   4. Other lack of coordination   5. Cognitive social or emotional deficit following cerebral infarction       Problem List Patient Active Problem List   Diagnosis Date Noted  . Sinusitis 03/03/2018  . 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  Cerebrovascular accident (CVA) ([place])   . Benign essential HTN   . Tachypnea   . Sensorineural hearing loss (SNHL) of both ears 01/31/2018  . Dyslipidemia 01/31/2018  . Acute ischemic stroke ([place]) 01/30/2018  . Pneumococcal bacteremia   . Pneumococcal meningitis   . Turner syndrome   . Hypothyroidism 01/15/2018  . Generalized anxiety disorder 01/15/2018  . Bicuspid aortic valve 04/06/2013  . HTN (hypertension) 04/06/2013    Sachin Ferencz 07/28/2018, 4:27 PM  [facility] [address] [place], [place], [address] Phone: [phone]   Fax:  [phone]  Name: Emma Martin MRN: [id number] Date of Birth: 1980-03-03

## 2018-08-04 ENCOUNTER — Ambulatory Visit: Payer: BC Managed Care – PPO | Admitting: Physical Therapy

## 2018-08-04 ENCOUNTER — Other Ambulatory Visit: Payer: Self-pay

## 2018-08-04 ENCOUNTER — Encounter: Payer: Self-pay | Admitting: Physical Therapy

## 2018-08-04 ENCOUNTER — Ambulatory Visit: Payer: BC Managed Care – PPO | Admitting: Occupational Therapy

## 2018-08-04 ENCOUNTER — Ambulatory Visit: Payer: BC Managed Care – PPO

## 2018-08-04 ENCOUNTER — Ambulatory Visit: Payer: Medicaid Other | Admitting: Rehabilitative and Restorative Service Providers"

## 2018-08-04 DIAGNOSIS — M6281 Muscle weakness (generalized): Secondary | ICD-10-CM | POA: Diagnosis not present

## 2018-08-04 DIAGNOSIS — R2681 Unsteadiness on feet: Secondary | ICD-10-CM | POA: Diagnosis not present

## 2018-08-04 DIAGNOSIS — R41842 Visuospatial deficit: Secondary | ICD-10-CM

## 2018-08-04 DIAGNOSIS — R41840 Attention and concentration deficit: Secondary | ICD-10-CM

## 2018-08-04 DIAGNOSIS — R41841 Cognitive communication deficit: Secondary | ICD-10-CM

## 2018-08-04 DIAGNOSIS — R41844 Frontal lobe and executive function deficit: Secondary | ICD-10-CM | POA: Diagnosis not present

## 2018-08-04 DIAGNOSIS — I69315 Cognitive social or emotional deficit following cerebral infarction: Secondary | ICD-10-CM

## 2018-08-04 DIAGNOSIS — R2689 Other abnormalities of gait and mobility: Secondary | ICD-10-CM | POA: Diagnosis not present

## 2018-08-04 DIAGNOSIS — R278 Other lack of coordination: Secondary | ICD-10-CM | POA: Diagnosis not present

## 2018-08-04 NOTE — Therapy (Signed)
[facility] [address] [place], [place], [address] Phone: [phone]   Fax:  [phone]  Physical Therapy Treatment  Patient Details  Name: Emma Martin MRN: [id number] Date of Birth: 05-13-1980 Referring Provider (PT): Maurice Small, MD  CLINIC OPERATION CHANGES: Outpatient Neuro Rehab is open at lower capacity following universal masking, social distancing, and patient screening.  The patient's COVID risk of complications score is 2.  Encounter Date: 08/04/2018  PT End of Session - 08/04/18 1638    Visit Number  14    Number of Visits  17   per recert 2/77/8242-3N/TI for 8 weeks   Date for PT Re-Evaluation  09/16/18    Authorization Type  BCBS, Medicaid (4th, 5th visits are telehealth); medicaid approval 10PT visits 05/26/2018-08/03/2018    Authorization Time Period  05/26/2018-08/03/2018    Authorization - Visit Number  7    Authorization - Number of Visits  10   Medicaid   PT Start Time  1107    PT Stop Time  1148    PT Time Calculation (min)  41 min    Equipment Utilized During Treatment  --   computer to communicate- PT types in Word program   Activity Tolerance  Patient tolerated treatment well    Behavior During Therapy  [facility] for tasks assessed/performed       Past Medical History:  Diagnosis Date  . Bicuspid aortic valve    , mild aortic insufficiency. (No SBE prophylaxis needed)  . Complication of anesthesia   . Dyslipidemia   . Generalized anxiety disorder    , with obsessive-compulsive traits.  . Hypertension   . Hypothyroidism   . Mixed gonadal dysgenesis    , (additional Y-bearing cell line) for which she reportedly underwent bilateral gonadectomy (patient unaware; needs confirmation) due to malignancy potential.  . PONV (postoperative nausea and vomiting)   . Primary amenorrhea    , treated starting at age 75 or 73 with estrogen and progesterone  . Scoliosis    , treated with Harrington rods  (1997) for stabilization.  . Seasonal allergic rhinitis   . Short stature    , previously treated with growth hormone age 25-16 years.  Radford Pax syndrome    , previously followed by pediatric endocrinologist Freddy Jaksch, MD at [facility] through her early 20s).    Past Surgical History:  Procedure Laterality Date  . TEE WITHOUT CARDIOVERSION N/A 01/19/2018   Procedure: TRANSESOPHAGEAL ECHOCARDIOGRAM (TEE);  Surgeon: Jerline Pain, MD;  Location: [facility] ENDOSCOPY;  Service: Cardiovascular;  Laterality: N/A;  . TEE WITHOUT CARDIOVERSION N/A 02/02/2018   Procedure: TRANSESOPHAGEAL ECHOCARDIOGRAM (TEE);  Surgeon: Elouise Munroe, MD;  Location: [facility];  Service: Cardiology;  Laterality: N/A;    There were no vitals filed for this visit.  Subjective Assessment - 08/04/18 1620    Subjective  Denies any falls or changes.  "How can I get her to walk faster?' from Dad.  Dad stating at times she tends to walk very slow and he has a hard time getting her to increase her speed.    Currently in Pain?  No/denies        [facility] Adult PT Treatment/Exercise - 08/04/18 0001      Ambulation/Gait   Ambulation/Gait  Yes    Ambulation/Gait Assistance  6: Modified independent (Device/Increase time);5: Supervision    Ambulation/Gait Assistance Details  Ambulated outdoors with cane.  Pt did have 2 bouts of decreased balance with grass and  mulch but recovered without assist.    Ambulation Distance (Feet)  600 Feet   x 1, 220 x 2 and various distances in gym for activities   Assistive device  None;Straight cane    Gait Pattern  Step-through pattern;Decreased stride length;Wide base of support;Decreased arm swing - right;Decreased arm swing - left;Decreased step length - left;Decreased step length - right    Ambulation Surface  Level;Unlevel;Indoor;Outdoor;Paved;Gravel;Grass    Stairs  Yes    Stairs Assistance  6: Modified independent (Device/Increase time);5: Supervision    Stairs Assistance  Details (indicate cue type and reason)  Modified I with handrail and alternating;supervision without rail and alternating    Stair Management Technique  One rail Right;Alternating pattern;No rails    Number of Stairs  12    Height of Stairs  6    Gait Comments  Dynamic gait activities performing horizontal head motion, vertical head motion,      Dynamic Gait Index   Level Surface  Mild Impairment    Change in Gait Speed  Mild Impairment    Gait with Horizontal Head Turns  Normal    Gait with Vertical Head Turns  Normal    Gait and Pivot Turn  Normal    Step Over Obstacle  Mild Impairment    Step Around Obstacles  Normal    Steps  Mild Impairment    Total Score  20      Therapeutic Activites    Therapeutic Activities  Other Therapeutic Activities    Other Therapeutic Activities  Bending to pick up objects from the floor and from under mat tables with supervision.      Exercises   Exercises  Other Exercises;Lumbar    Other Exercises   deep squat, wall slides x 10 reps., marching at counter with 1 UE support x 10 reps then marching 15 reps x 2 sets with alternating UE/LE lifts with supervision      Lumbar Exercises: Standing   Other Standing Lumbar Exercises  Diagonal chop/reach with ball x 10 reps each direction-see HEP for details      Lumbar Exercises: Quadruped   Opposite Arm/Leg Raise  Right arm/Left leg;Left arm/Right leg;10 reps;2 seconds;Limitations    Opposite Arm/Leg Raise Limitations  Pt with difficulty maintaining stable trunk and hips and needed min assist at times to balance             PT Education - 08/04/18 1635    Education Details  Plan for d/c next visit per POC, HEP as previously written, obtaining beach ball for use with diagonal chop    Person(s) Educated  Patient;Parent(s)    Methods  Explanation;Demonstration    Comprehension  Verbalized understanding       PT Short Term Goals - 07/21/18 1108      PT SHORT TERM GOAL #1   Title  Pt will perform  HEP with supervision, including household tasks and mobility, for improved balance, strength and gait.  TARGET 07/16/2018    Baseline  per report, doing HEP 2 times/day.    Time  4    Period  Weeks    Status  Achieved    Target Date  07/16/18      PT SHORT TERM GOAL #2   Title  Pt will improve 5x sit<>stand independently, to less than or equal to 13 seconds to demonstrate improved functional strength.    Baseline  14.52 on 07/21/2018.  Improved, but not to goal level.    Time  4  Period  Weeks    Status  Partially Met    Target Date  07/16/18      PT SHORT TERM GOAL #3   Title  Pt will improve DGI to at least 16/24 for decreased fall risk.    Baseline  19/24    Time  4    Period  Weeks    Status  Achieved    Target Date  07/16/18      PT SHORT TERM GOAL #4   Title  Pt will improve TUG score to less than or equal to 13.5 seconds for decreased fall risk.    Baseline  12.41 seconds.    Time  4    Period  Weeks    Status  Achieved    Target Date  07/16/18      PT SHORT TERM GOAL #5   Title  Pt will negotiate at least 4 steps, modified independently, alternating step pattern for improved functional mobility in home.    Baseline  Able to demonstrate with one handrail and reciprocal pattern.    Time  4    Period  Weeks    Status  Achieved    Target Date  07/16/18      PT SHORT TERM GOAL #6   Title       Baseline           PT Long Term Goals - 08/04/18 1636      PT LONG TERM GOAL #1   Title  Pt will verbalize plans for continued community fitness post d/c from PT to maximize gains made in PT.  TARGET 08/13/2018    Baseline       Time  8    Period  Weeks    Status  On-going      PT LONG TERM GOAL #2   Title  Pt will perform squats to pick up objects from floor, 4 of 5 trials with no UE support, to demo improved functional strength and balance.    Baseline  Met on 07/21/2018.    Time  8    Period  Weeks    Status  Achieved      PT LONG TERM GOAL #3   Title  Pt will  perform SLS at least 3 sec each leg for improved obstacle and stair negotiation.    Baseline  Met on 07/28/2018    Time  8    Period  Weeks    Status  Achieved      PT LONG TERM GOAL #4   Title  Pt will improve DGI score to at least 20/24 for decreased fall risk.    Baseline  20/24 on 08/04/18    Time  8    Period  Weeks    Status  Achieved      PT LONG TERM GOAL #5   Title  Pt will ambulate independently, 1000 ft, indoor and outdoor surfaces, no LOB for improved community gait.    Baseline  household distances no device 06/18/2018    Time  8    Period  Weeks    Status  On-going            Plan - 08/04/18 1638    Clinical Impression Statement  Pt met LTG 2 and 4.  Pt able to perform HEP independently today.  Per primary PT, to check remaining goals next visit and plan for d/c from PT.    PT Treatment/Interventions  ADLs/Self Care Home Management;DME  Instruction;Gait training;Functional mobility training;Stair training;Therapeutic activities;Therapeutic exercise;Patient/family education;Neuromuscular re-education;Balance training    PT Next Visit Plan  Check outdoor balance with gait on various surfaces again.  Discharge from PT per primary PT.    PT Home Exercise Plan  Started new with medbridge code  [id number]    Consulted and Agree with Plan of Care  Family member/caregiver;Patient    Family Member Consulted  father       Patient will benefit from skilled therapeutic intervention in order to improve the following deficits and impairments:  Abnormal gait, Decreased balance, Decreased mobility, Decreased strength, Difficulty walking, Postural dysfunction  Visit Diagnosis: 1. Muscle weakness (generalized)   2. Unsteadiness on feet   3. Other abnormalities of gait and mobility        Problem List Patient Active Problem List   Diagnosis Date Noted  . Sinusitis 03/03/2018  . 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 Cerebrovascular accident (CVA) ([place])   . Benign  essential HTN   . Tachypnea   . Sensorineural hearing loss (SNHL) of both ears 01/31/2018  . Dyslipidemia 01/31/2018  . Acute ischemic stroke ([place]) 01/30/2018  . Pneumococcal bacteremia   . Pneumococcal meningitis   . Turner syndrome   . Hypothyroidism 01/15/2018  . Generalized anxiety disorder 01/15/2018  . Bicuspid aortic valve 04/06/2013  . HTN (hypertension) 04/06/2013   Narda Bonds, PTA [facility] 08/04/18 4:44 PM Phone: [phone] Fax: [facility] [address] [place], [place], [address] Phone: [phone]   Fax:  [phone]  Name: Emma Martin MRN: [id number] Date of Birth: September 15, 1980

## 2018-08-04 NOTE — Patient Instructions (Signed)
   As we close this chapter on speech therapy (there may be other chapters in the future), think about what you can do to foster your focus and concentration.  DO MORE OF: Cognitive apps Household tasks Reading "movie books"  DO LESS OF: Watching TV      Although you could work on attention and focus by taking notes on the plot of a show (increase the length as it gets easier to focus) Sleeping/napping

## 2018-08-04 NOTE — Therapy (Signed)
[facility] [address] [place], [place], [address] Phone: [phone]   Fax:  [phone]  Speech Language Pathology Treatment, and Discharge Summary  Patient Details  Name: Emma Martin MRN: [id number] Date of Birth: Jun 09, 1980 Referring Provider (SLP): Maurice Small, MD   Encounter Date: 08/04/2018   SPEECH THERAPY DISCHARGE SUMMARY (treatment note below)  Visits from Start of Care: 13  Current functional level related to goals / functional outcomes: See above. Pt with low-level attention goal met, however pt cont to have difficulty with mod level attention skills. Pt with cont'd decr'd awareness and decr'd initiation. Decr'd speed of processing. Pt's hearing loss has made efficiency in ST difficult. Pt has used a speech to text app. SLP would like to have pt return when she becomes more proficient at speech recognition using cochlear implant. Father has had education throughout the course of therapy re: how keeping a schedule is beneficial for pt, about how it is best to cue pt instead of feed her answers/solutions, and how it is essential to assist pt with initiation of therapy homework. Unfortunately, very little carryover has been seen with this.   Remaining deficits: Mod cognitive communication deficits.   Education / Equipment: See above (last paragraph in "current functional level...")   Plan: Patient agrees to discharge.  Patient goals were partially met. Patient is being discharged due to                                                     ?????meeting current rehab potential at this time.       End of Session - 08/04/18 1159    Visit Number  13    Number of Visits  17    Date for SLP Re-Evaluation  08/25/18    SLP Start Time  1004    SLP Stop Time   1046    SLP Time Calculation (min)  42 min    Activity Tolerance  Patient tolerated treatment well       Past Medical History:  Diagnosis Date  .  Bicuspid aortic valve    , mild aortic insufficiency. (No SBE prophylaxis needed)  . Complication of anesthesia   . Dyslipidemia   . Generalized anxiety disorder    , with obsessive-compulsive traits.  . Hypertension   . Hypothyroidism   . Mixed gonadal dysgenesis    , (additional Y-bearing cell line) for which she reportedly underwent bilateral gonadectomy (patient unaware; needs confirmation) due to malignancy potential.  . PONV (postoperative nausea and vomiting)   . Primary amenorrhea    , treated starting at age 45 or 18 with estrogen and progesterone  . Scoliosis    , treated with Harrington rods (1997) for stabilization.  . Seasonal allergic rhinitis   . Short stature    , previously treated with growth hormone age 37-15 years.  Radford Pax syndrome    , previously followed by pediatric endocrinologist Freddy Jaksch, MD at [facility] through her early 41s).    Past Surgical History:  Procedure Laterality Date  . TEE WITHOUT CARDIOVERSION N/A 01/19/2018   Procedure: TRANSESOPHAGEAL ECHOCARDIOGRAM (TEE);  Surgeon: Jerline Pain, MD;  Location: [facility] ENDOSCOPY;  Service: Cardiovascular;  Laterality: N/A;  . TEE WITHOUT CARDIOVERSION N/A 02/02/2018   Procedure: TRANSESOPHAGEAL ECHOCARDIOGRAM (TEE);  Surgeon: Cherlynn Kaiser  A, MD;  Location: [facility];  Service: Cardiology;  Laterality: N/A;     ST TREATMENT NOTE - 08/04/18   There were no vitals filed for this visit.  Subjective Assessment - 08/04/18 1011    Subjective  Father handed pt notebook when SLP asked pt when was day of her coclear implant activation.    Patient is accompained by:  Family member   dad   Currently in Pain?  No/denies            ADULT SLP TREATMENT - 08/04/18 1014      General Information   Behavior/Cognition  Alert;Cooperative;Pleasant mood;Distractible      Cognitive-Linquistic Treatment   Treatment focused on  Cognition      Assessment / Recommendations / Plan   Plan   Discharge SLP treatment due to (comment)      Progression Toward Goals   Progression toward goals  --   d/c day - see goals      SLP Education - 08/04/18 1153    Education Details  how to assist pt with initiation, how to cue pt (and it's better to cue pt instead of) to provide answers or solutions for her, home tasks to do and not to do in order to improve attention, suggestion on when to possibly return to ST    Person(s) Educated  Patient;Parent(s)    Methods  Explanation;Demonstration    Comprehension  Returned demonstration;Verbalized understanding       SLP Short Term Goals - 06/11/18 1152      SLP SHORT TERM GOAL #1   Title  pt will demo sustained attention to complete 4 7-minute cognitive linguistic tasks, for improved participation in therapy over 3 sessions    Baseline  05/18/18; 05/25/18; 06/01/18    Time  --    Period  --    Status  Achieved   or 9 total visits, for all STGs     SLP SHORT TERM GOAL #2   Title  pt will demo incr'd awareness by telling SLP 3 things that are more difficult now than prior to CVA    Time  1    Period  --    Status  Not Met      SLP SHORT TERM GOAL #3   Title  pt will use a Child psychotherapist with occasional min A to do so    Time  1    Period  --    Status  Not Met       SLP Long Term Goals - 08/04/18 1205      SLP LONG TERM GOAL #1   Title  pt will demo sustained/selective attention to complete 3 12-minute cognitive linguistic tasks, for improved participation in therapy over 3 sessions    Period  --   or 17 sessions, for all LTGs   Status  Partially Met      SLP LONG TERM GOAL #2   Title  pt's father/family will report pt asking about schedule less than prior to ST    Status  Achieved      SLP LONG TERM GOAL #3   Title  pt will demo use of memory notebook/planner for homework, schedule verification, etc in 8 therapy sessions    Baseline  07-21-18    Status  Not Met   once      Plan - 08/04/18 1200     Clinical Impression Statement  Moderate impairment cotinues in attention, memory, and awareness. SLP continues  to encourage father to initiate that pt works with cognitive apps and other cognitive tasks at home. Today, father stated, "I see her working sometimes with her Melanee Spry." Pt's attention and initiation cont limited at this time. Pt will be d/c'd at this time. SLP focused today's session on things to do more of and things to do less of in order to improve cognitive linguistics/attention. Pt with cueing necessary due to decr'd initiation and decr'd processing speed. Father was encourgaed to keep a schedule to encourage pt initiation with the routine of a schedule. SLP suggested father start with a few hours of scheduled time and then expand as he is able, as pt will need encouragement to initiate beneficial tasks.    Speech Therapy Frequency  2x / week    Duration  --   8 weeks or 17 visits   Treatment/Interventions  Cueing hierarchy;SLP instruction and feedback;Compensatory strategies;Functional tasks;Cognitive reorganization;Internal/external aids;Patient/family education    Potential to Achieve Goals  Good    Potential Considerations  Ability to learn/carryover information;Co-morbidities;Severity of impairments    Consulted and Agree with Plan of Care  Patient;Family member/caregiver    Family Member Consulted  father       Patient will benefit from skilled therapeutic intervention in order to improve the following deficits and impairments:   1. Cognitive communication deficit             Problem List Patient Active Problem List   Diagnosis Date Noted  . Sinusitis 03/03/2018  . Right thalamic infarction ([facility]) 02/03/2018  . Cerebrovascular accident (CVA) ([place])   . Benign essential HTN   . Tachypnea   . Sensorineural hearing loss (SNHL) of both ears 01/31/2018  . Dyslipidemia 01/31/2018  . Acute ischemic stroke ([facility]) 01/30/2018  . Pneumococcal bacteremia   . Pneumococcal  meningitis   . Turner syndrome   . Hypothyroidism 01/15/2018  . Generalized anxiety disorder 01/15/2018  . Bicuspid aortic valve 04/06/2013  . HTN (hypertension) 04/06/2013    [facility] ,[place], [place]  08/04/2018, 12:06 PM  [facility] [address] [place], [place], [address] Phone: [phone]   Fax:  [phone]   Name: Emma Martin MRN: [id number] Date of Birth: 03/02/1980

## 2018-08-04 NOTE — Patient Instructions (Signed)
Access Code: [email or web address]  URL: [email or web address]  Date: 08/04/2018  Prepared by: Nita Sells   Exercises  Bird Dog - 10 reps - 2 sets - 3 seconds hold - 2x daily - 7x weekly  Runner's March - 10 reps - 2 sets - 3 seconds hold - 2x daily - 7x weekly  Single Leg Stance - 3 reps - 1 sets - 10 seconds hold - 2x daily - 7x weekly  Standing Diagonal Chops with Medicine Ball - 10 reps - 3 sets - 1x daily - 7x weekly  Wall Squat - 10 reps - 1 sets - 3-5 seconds hold - 2x daily - 7x weekly

## 2018-08-04 NOTE — Therapy (Signed)
[facility] [address] [place], [place], [address] Phone: [phone]   Fax:  [phone]  Occupational Therapy Treatment  Patient Details  Name: Emma Martin MRN: [id number] Date of Birth: [birth year] Referring Provider (OT): Maurice Small, MD   Encounter Date: 08/04/2018  OT End of Session - 08/04/18 1210    Visit Number  10    Number of Visits  25    Date for OT Re-Evaluation  08/14/18   date extended due to missed visits   Authorization Type  BCBS / Medicaid    Authorization Time Period  BCBS 60 VL (OT/PT combined seen on same day counts as 2 visits)  Medicaid 07/10/18 1 visit approved.    Authorization - Visit Number  1   Medicaid   Authorization - Number of Visits  1    OT Start Time  3716    OT Stop Time  1235    OT Time Calculation (min)  43 min    Activity Tolerance  Patient tolerated treatment well    Behavior During Therapy  WFL for tasks assessed/performed       Past Medical History:  Diagnosis Date  . Bicuspid aortic valve    , mild aortic insufficiency. (No SBE prophylaxis needed)  . Complication of anesthesia   . Dyslipidemia   . Generalized anxiety disorder    , with obsessive-compulsive traits.  . Hypertension   . Hypothyroidism   . Mixed gonadal dysgenesis    , (additional Y-bearing cell line) for which she reportedly underwent bilateral gonadectomy (patient unaware; needs confirmation) due to malignancy potential.  . PONV (postoperative nausea and vomiting)   . Primary amenorrhea    , treated starting at age 80 or 69 with estrogen and progesterone  . Scoliosis    , treated with Harrington rods (1997) for stabilization.  . Seasonal allergic rhinitis   . Short stature    , previously treated with growth hormone age 57-15 years.  Radford Pax syndrome    , previously followed by pediatric endocrinologist Freddy Jaksch, MD at [facility] through her early 93s).    Past Surgical  History:  Procedure Laterality Date  . TEE WITHOUT CARDIOVERSION N/A 01/19/2018   Procedure: TRANSESOPHAGEAL ECHOCARDIOGRAM (TEE);  Surgeon: Jerline Pain, MD;  Location[facility] ENDOSCOPY;  Service: Cardiovascular;  Laterality: N/A;  . TEE WITHOUT CARDIOVERSION N/A 02/02/2018   Procedure: TRANSESOPHAGEAL ECHOCARDIOGRAM (TEE);  Surgeon: Elouise Munroe, MD;  Location: [facility];  Service: Cardiology;  Laterality: N/A;    There were no vitals filed for this visit.  Subjective Assessment - 08/04/18 1211    Patient is accompanied by:  --   typed instructions/questions for pt in word document during session   Pertinent History  meningitis, CVA 01/30/2018    Currently in Pain?  No/denies    Pain Onset  Today                 CLINIC OPERATION CHANGES: Outpatient [facility] is open at lower capacity following universal masking, social distancing, and patient screening.  The patient's COVID risk of complications score is 2. Functional organization task simple to add items to an existing schedule, pt demo improved performance with this task. Reinforced importance of pt.participation in IADLs,  Upgraded to green theraband HEP 10-15 reps each, min v.c initially, Pt's father is able to cue her at home. Functional reaching to simulate unloading silverware from dishwasher, pt retrieved clothespins from the chair with LUE and  placed on overhead antennae. Gripper set at level 2 to pick up 1 inch blocks with left  UE for sustained grip, several rest breaks required min-mod difficulty/ v.c Functional organization tasks on constant therapy with 99% accuracy.            OT Short Term Goals - 07/13/18 1021      OT SHORT TERM GOAL #1   Title  I with initial HEP    Baseline  needs reminders to perfrom and min v.c    Time  6    Period  Weeks    Status  Achieved   05/25/18:  not currently performing, ST provided pt with exercise checklist and instructed in use     OT SHORT TERM GOAL #2    Title  Pt will perform all basic ADLS modified independently    Baseline  distant supervision    Time  6    Period  Weeks    Status  Achieved   06/18/18 pt reports continued distant supervision.  07/08/18:  met     OT SHORT TERM GOAL #3   Title  Pt will perform cold meal prep modified indpendently and basic cooking task with min A    Baseline  Pt is able to perfrom cold sandwich prep and reheat in microwave, pt has not attempted use of stove yet    Time  6    Period  Weeks    Status  Partially Met   makes a sandwich.  07/08/18:  snack prep and cutting pepper     OT SHORT TERM GOAL #4   Title  Pt will perform basic home management modified I    Baseline  currently pt is only folding laundry, dependent with other home management    Time  6    Period  Weeks    Status  On-going   05/25/18:  only folding clothes.  07/08/18:  also picking up dishes per dad     OT SHORT TERM GOAL #5   Title  Pt will demonstrate improved fine motor coordination as evidenced by decreasing 9 hole peg test by 5 secs for LUE.    Baseline  27.34-RUE, 36.75 -LUE    Time  6    Period  Weeks    Status  Achieved   06/01/18:  R-27.34sec, L-36.75sec     OT SHORT TERM GOAL #6   Title  Further assess visual perceptual skills and set goals prn--see LTGs    Time  6    Period  Weeks    Status  Deferred        OT Long Term Goals - 07/28/18 1221      OT LONG TERM GOAL #1   Title  Pt will perform mod complex home managment modified independently.    Baseline  only folding laundry, dependent for other tasks    Time  12    Period  Weeks    Status  On-going      OT LONG TERM GOAL #2   Title  Pt will perform basic cooking with supervision.    Baseline  Pt has only used microwave, she has not attempted stovetop cooking    Time  12    Period  Weeks    Status  On-going   Pt performed 1x in clinic at a supervision level     OT LONG TERM GOAL #3   Title  Pt will increase bilateral grip strength by 5 lbs for  increased  functional use during ADLs.    Baseline  RUE 35, LUE 35    Time  12    Period  Weeks    Status  On-going      OT LONG TERM GOAL #4   Title  Pt will demonstrate adequate bilateral UE strength and endurance to retrieve a 3 lbs weight from overhead shelf with left and right UE's individually  without drops x 5 reps in standing modified independently.    Baseline  unable due to decreased UE strength, currently pt is exercising with 1 lb weights    Time  12    Period  Weeks    Status  On-going      OT LONG TERM GOAL #5   Title  Pt will demonstrate abilty to sequence a multi step functional or home management task modified independently.    Baseline  Pt requires assist for sequencing/ organization, and v.c to get started/ initiate task    Time  12    Period  Weeks    Status  On-going      OT LONG TERM GOAL #6   Title  Pt/ family will verbalize understanding cogntive compensation/ adapted strategies for ADLs/IADLs.    Baseline  needs reinforcement, and further education in strategies    Time  12    Period  Weeks    Status  On-going      OT LONG TERM GOAL #7   Title  Pt will perform simple environmental scanning with at least 95% accuracy for improved attention and safety.    Baseline  90%    Time  12    Period  Weeks    Status  On-going              Patient will benefit from skilled therapeutic intervention in order to improve the following deficits and impairments:           Visit Diagnosis: No diagnosis found.    Problem List Patient Active Problem List   Diagnosis Date Noted  . Sinusitis 03/03/2018  . Right thalamic infarction ([place]) 02/03/2018  . Cerebrovascular accident (CVA) ([place])   . Benign essential HTN   . Tachypnea   . Sensorineural hearing loss (SNHL) of both ears 01/31/2018  . Dyslipidemia 01/31/2018  . Acute ischemic stroke ([place]) 01/30/2018  . Pneumococcal bacteremia   . Pneumococcal meningitis   . Turner syndrome   . Hypothyroidism  01/15/2018  . Generalized anxiety disorder 01/15/2018  . Bicuspid aortic valve 04/06/2013  . HTN (hypertension) 04/06/2013    Emma Martin 08/04/2018, 12:15 PM  [facility] [address] [place], [place], [address] Phone: [phone]   Fax:  [phone]  Name: Emma Martin MRN: [id number] Date of Birth: [birth year]

## 2018-08-11 ENCOUNTER — Encounter: Payer: Self-pay | Admitting: Rehabilitative and Restorative Service Providers"

## 2018-08-11 ENCOUNTER — Other Ambulatory Visit: Payer: Self-pay

## 2018-08-11 ENCOUNTER — Ambulatory Visit: Payer: BC Managed Care – PPO | Admitting: Occupational Therapy

## 2018-08-11 ENCOUNTER — Ambulatory Visit: Payer: BC Managed Care – PPO | Admitting: Rehabilitative and Restorative Service Providers"

## 2018-08-11 DIAGNOSIS — M6281 Muscle weakness (generalized): Secondary | ICD-10-CM | POA: Diagnosis not present

## 2018-08-11 DIAGNOSIS — R41841 Cognitive communication deficit: Secondary | ICD-10-CM | POA: Diagnosis not present

## 2018-08-11 DIAGNOSIS — R278 Other lack of coordination: Secondary | ICD-10-CM

## 2018-08-11 DIAGNOSIS — R2681 Unsteadiness on feet: Secondary | ICD-10-CM | POA: Diagnosis not present

## 2018-08-11 DIAGNOSIS — I69315 Cognitive social or emotional deficit following cerebral infarction: Secondary | ICD-10-CM

## 2018-08-11 DIAGNOSIS — R41842 Visuospatial deficit: Secondary | ICD-10-CM

## 2018-08-11 DIAGNOSIS — R41840 Attention and concentration deficit: Secondary | ICD-10-CM

## 2018-08-11 DIAGNOSIS — R2689 Other abnormalities of gait and mobility: Secondary | ICD-10-CM

## 2018-08-11 DIAGNOSIS — R41844 Frontal lobe and executive function deficit: Secondary | ICD-10-CM

## 2018-08-11 NOTE — Therapy (Signed)
[facility] [address] [place], [place], [address] Phone: [phone]   Fax:  [phone]  Occupational Therapy Treatment  Patient Details  Name: Emma Martin MRN: [id number] Date of Birth: [birth year] Referring Provider (OT): Maurice Small, MD   Encounter Date: 08/11/2018  OT End of Session - 08/11/18 1313    Visit Number  13   corrected visit count   Number of Visits  25    Date for OT Re-Evaluation  08/14/18   date extended due to missed visits   Authorization Type  BCBS / Medicaid    Authorization Time Period  BCBS 60 VL (OT/PT combined seen on same day counts as 2 visits)  Medicaid 07/10/18 1 visit approved.    Authorization - Visit Number  1   Medicaid   Authorization - Number of Visits  1    OT Start Time  1155    OT Stop Time  1237    OT Time Calculation (min)  42 min    Activity Tolerance  Patient tolerated treatment well    Behavior During Therapy  WFL for tasks assessed/performed       Past Medical History:  Diagnosis Date  . Bicuspid aortic valve    , mild aortic insufficiency. (No SBE prophylaxis needed)  . Complication of anesthesia   . Dyslipidemia   . Generalized anxiety disorder    , with obsessive-compulsive traits.  . Hypertension   . Hypothyroidism   . Mixed gonadal dysgenesis    , (additional Y-bearing cell line) for which she reportedly underwent bilateral gonadectomy (patient unaware; needs confirmation) due to malignancy potential.  . PONV (postoperative nausea and vomiting)   . Primary amenorrhea    , treated starting at age 18 or 78 with estrogen and progesterone  . Scoliosis    , treated with Harrington rods (1997) for stabilization.  . Seasonal allergic rhinitis   . Short stature    , previously treated with growth hormone age 20-15 years.  Radford Pax syndrome    , previously followed by pediatric endocrinologist Freddy Jaksch, MD at [facility] through her early 51s).     Past Surgical History:  Procedure Laterality Date  . TEE WITHOUT CARDIOVERSION N/A 01/19/2018   Procedure: TRANSESOPHAGEAL ECHOCARDIOGRAM (TEE);  Surgeon: Jerline Pain, MD;  Location: [facility] ENDOSCOPY;  Service: Cardiovascular;  Laterality: N/A;  . TEE WITHOUT CARDIOVERSION N/A 02/02/2018   Procedure: TRANSESOPHAGEAL ECHOCARDIOGRAM (TEE);  Surgeon: Elouise Munroe, MD;  Location: [facility];  Service: Cardiology;  Laterality: N/A;    There were no vitals filed for this visit.  Subjective Assessment - 08/11/18 1317    Patient is accompanied by:  --   typed instructions/questions for pt in word document during session   Pertinent History  meningitis, CVA 01/30/2018    Currently in Pain?  No/denies    Pain Onset  Today                     CLINIC OPERATION CHANGES: Outpatient Neuro Rehab is open at lower capacity following universal masking, social distancing, and patient screening.  The patient's COVID risk of complications score is 2. Reviewed green theraband HEP 10-15 reps each, min v.c initially,and theraputty HEP Pt's father is able to cue her at home. 4 step basic Functional organization tasks on constant therapy with 100% accuracy. Environmental scanning task 60% first pass, pt located 3 on second pass and required v.c to locate the final item. Therapist  checked progress towards goals and reinforced use of strategies to compensate for cognitive deficits and importance of pt. participation in daily activities.         OT Short Term Goals - 08/11/18 1314      OT SHORT TERM GOAL #1   Title  I with initial HEP    Baseline  needs reminders to perfrom and min v.c    Time  6    Period  Weeks    Status  Achieved   05/25/18:  not currently performing, ST provided pt with exercise checklist and instructed in use     OT SHORT TERM GOAL #2   Title  Pt will perform all basic ADLS modified independently    Baseline  distant supervision    Time  6    Period   Weeks    Status  Achieved   06/18/18 pt reports continued distant supervision.  07/08/18:  met     OT SHORT TERM GOAL #3   Title  Pt will perform cold meal prep modified indpendently and basic cooking task with min A    Baseline  Pt is able to perfrom cold sandwich prep and reheat in microwave, pt has not attempted use of stove yet    Time  6    Period  Weeks    Status  Achieved      OT SHORT TERM GOAL #4   Title  Pt will perform basic home management modified I    Baseline  currently pt is only folding laundry, dependent with other home management    Time  6    Period  Weeks    Status  Achieved   05/25/18:  only folding clothes.  07/08/18:  also picking up dishes per dad     OT SHORT TERM GOAL #5   Title  Pt will demonstrate improved fine motor coordination as evidenced by decreasing 9 hole peg test by 5 secs for LUE.    Baseline  27.34-RUE, 36.75 -LUE    Time  6    Period  Weeks    Status  Achieved   06/01/18:  R-27.34sec, L-36.75sec     OT SHORT TERM GOAL #6   Title  Further assess visual perceptual skills and set goals prn--see LTGs    Time  6    Period  Weeks    Status  Deferred        OT Long Term Goals - 08/11/18 1210      OT LONG TERM GOAL #1   Title  Pt will perform mod complex home managment modified independently.    Baseline  only folding laundry, dependent for other tasks    Time  12    Period  Weeks    Status  Partially Met   Pt has done laundry but her father brought it downstairs for her, she still needs cueing to initiate tasks but ehn she is able to carry out     OT [place] #2   Title  Pt will perform basic cooking with supervision.    Baseline  Pt has only used microwave, she has not attempted stovetop cooking    Time  12    Period  Weeks    Status  Achieved   Pt performed 1x in clinic at a supervision level     OT LONG TERM GOAL #3   Title  Pt will increase bilateral grip strength by 5 lbs for increased functional use during ADLs.  Baseline  RUE 35, LUE 35    Time  12    Period  Weeks    Status  Partially Met   RUE 40 lbs, LUE 38 lbs     OT LONG TERM GOAL #4   Title  Pt will demonstrate adequate bilateral UE strength and endurance to retrieve a 3 lbs weight from overhead shelf with left and right UE's individually  without drops x 5 reps in standing modified independently.    Baseline  unable due to decreased UE strength, currently pt is exercising with 1 lb weights    Time  12    Period  Weeks    Status  Achieved      OT LONG TERM GOAL #5   Title  Pt will demonstrate abilty to sequence a multi step functional or home management task modified independently.    Baseline  Pt requires assist for sequencing/ organization, and v.c to get started/ initiate task    Time  12    Period  Weeks    Status  Partially Met   met for basic 4 step tasks, not met for moderate complex detailed tasks     OT LONG TERM GOAL #6   Title  Pt/ family will verbalize understanding cogntive compensation/ adapted strategies for ADLs/IADLs.    Baseline  needs reinforcement, and further education in strategies    Time  12    Period  Weeks    Status  Achieved      OT LONG TERM GOAL #7   Title  Pt will perform simple environmental scanning with at least 95% accuracy for improved attention and safety.    Baseline  90%    Time  12    Period  Weeks    Status  Not Met   60% first pass today, pt does not consitently scan when walking           Plan - 08/11/18 1319    Clinical Impression Statement  Pt demonstrates good overall progress. Pt/ father agree with d/c at this time. Pt and father are aware of activities to perfrom at home to continue to progress.    Occupational performance deficits (Please refer to evaluation for details):  ADL's;IADL's;Education;Social Participation;Leisure;Play;Work    Statistician;Safety Awareness;Sequencing;Energy/Drive;Perception    Rehab Potential  Fair    OT  Frequency  2x / week    OT Duration  12 weeks    OT Treatment/Interventions  Self-care/ADL training;Fluidtherapy;Therapeutic exercise;Energy conservation;Gait Training;Functional Furniture conservator/restorer;Therapeutic activities;Visual/perceptual remediation/compensation;Balance training;Patient/family education;Cognitive remediation/compensation;Passive range of motion;Manual Therapy;DME and/or AE instruction;Neuromuscular education;Paraffin;Ultrasound;Moist Heat;Aquatic Therapy    Plan  continue simple home management/ functional activities.    Consulted and Agree with Plan of Care  Patient       Patient will benefit from skilled therapeutic intervention in order to improve the following deficits and impairments:     Cognitive Skills: Attention, Problem Solve, Memory, Safety Awareness, Sequencing, Energy/Drive, Perception   OCCUPATIONAL THERAPY DISCHARGE SUMMARY    Current functional level related to goals / functional outcomes: Pt made good overall progress towards goals. She did not fully achieve all goals due to cognitive deficits.   Remaining deficits: cognitive deficits, decreased visual scanning, decreased balance, decreased strength   Education / Equipment: Pt/ father were educated regarding: cogntive compensation strategies, and HEP. Pt/ father agree with d/c.  Plan: Patient agrees to discharge.  Patient goals were partially met. Patient is being discharged due to meeting the stated rehab goals.  ?????  Visit Diagnosis: 1. Muscle weakness (generalized)   2. Unsteadiness on feet   3. Frontal lobe and executive function deficit   4. Attention and concentration deficit   5. Visuospatial deficit   6. Other lack of coordination   7. Cognitive social or emotional deficit following cerebral infarction   8. Other abnormalities of gait and mobility       Problem List Patient Active Problem List   Diagnosis Date Noted  . Sinusitis 03/03/2018  . Right thalamic infarction  ([place]) 02/03/2018  . Cerebrovascular accident (CVA) ([place])   . Benign essential HTN   . Tachypnea   . Sensorineural hearing loss (SNHL) of both ears 01/31/2018  . Dyslipidemia 01/31/2018  . Acute ischemic stroke ([place]) 01/30/2018  . Pneumococcal bacteremia   . Pneumococcal meningitis   . Turner syndrome   . Hypothyroidism 01/15/2018  . Generalized anxiety disorder 01/15/2018  . Bicuspid aortic valve 04/06/2013  . HTN (hypertension) 04/06/2013    , 08/11/2018, 1:28 PM Theone Murdoch, OTR/L [phone] Phone: [phone] 1:31 PM 08/11/18 [facility] [address] [place], [place], [address] Phone: [phone]   Fax:  [phone]  Name: Emma Martin MRN: [id number] Date of Birth: 11/01/1980

## 2018-08-11 NOTE — Therapy (Signed)
[facility] [address] [place], [place], [address] Phone: [phone]   Fax:  [phone]  Physical Therapy Treatment and Discharge Summary  Patient Details  Name: Emma Martin MRN: [id number] Date of Birth: 10/11/1980 Referring Provider (PT): Maurice Small, MD  CLINIC OPERATION CHANGES: Outpatient Neuro Rehab is open at lower capacity following universal masking, social distancing, and patient screening.  The patient's COVID risk of complications score is 2.  Encounter Date: 08/11/2018  PT End of Session - 08/11/18 1146    Visit Number  15    Number of Visits  17   per recert 02/13/[id number]/PJ for 8 weeks   Date for PT Re-Evaluation  09/16/18    Authorization Type  BCBS, Medicaid (4th, 5th visits are telehealth); medicaid approval 10PT visits 05/26/2018-08/03/2018    Authorization Time Period  05/26/2018-08/03/2018    Authorization - Visit Number  8    Authorization - Number of Visits  10   Medicaid   PT Start Time  1104    PT Stop Time  1137    PT Time Calculation (min)  33 min    Equipment Utilized During Treatment  --   computer to communicate- PT types in Word program   Activity Tolerance  Patient tolerated treatment well    Behavior During Therapy  [facility] for tasks assessed/performed       Past Medical History:  Diagnosis Date  . Bicuspid aortic valve    , mild aortic insufficiency. (No SBE prophylaxis needed)  . Complication of anesthesia   . Dyslipidemia   . Generalized anxiety disorder    , with obsessive-compulsive traits.  . Hypertension   . Hypothyroidism   . Mixed gonadal dysgenesis    , (additional Y-bearing cell line) for which she reportedly underwent bilateral gonadectomy (patient unaware; needs confirmation) due to malignancy potential.  . PONV (postoperative nausea and vomiting)   . Primary amenorrhea    , treated starting at age 84 or 76 with estrogen and progesterone  . Scoliosis    , treated  with Harrington rods (1997) for stabilization.  . Seasonal allergic rhinitis   . Short stature    , previously treated with growth hormone age 38-15 years.  Radford Pax syndrome    , previously followed by pediatric endocrinologist Freddy Jaksch, MD at [facility] through her early 43s).    Past Surgical History:  Procedure Laterality Date  . TEE WITHOUT CARDIOVERSION N/A 01/19/2018   Procedure: TRANSESOPHAGEAL ECHOCARDIOGRAM (TEE);  Surgeon: Jerline Pain, MD;  Location: [facility] ENDOSCOPY;  Service: Cardiovascular;  Laterality: N/A;  . TEE WITHOUT CARDIOVERSION N/A 02/02/2018   Procedure: TRANSESOPHAGEAL ECHOCARDIOGRAM (TEE);  Surgeon: Elouise Munroe, MD;  Location: [facility];  Service: Cardiology;  Laterality: N/A;    There were no vitals filed for this visit.  Subjective Assessment - 08/11/18 1103    Subjective  The patient arrives with father using cane.  PT communicated via typing recommending dec'd use of cane.    Patient is accompained by:  Family member    Patient Stated Goals  Want to feel stronger moving around, to not depend on walker.    Currently in Pain?  No/denies                       [facility] Adult PT Treatment/Exercise - 08/11/18 1149      Ambulation/Gait   Ambulation/Gait  Yes    Ambulation/Gait Assistance  6: Modified independent (Device/Increase time)  Ambulation/Gait Assistance Details  Ambulated outdoors without loss of balance on grass, curbs, paved surfaces.    Ambulation Distance (Feet)  1000 Feet    Assistive device  None    Gait Pattern  Step-through pattern;Wide base of support;Decreased step length - left    Ambulation Surface  Level;Unlevel;Indoor;Outdoor;Paved;Grass      Self-Care   Self-Care  Other Self-Care Comments    Other Self-Care Comments   In addition to walking program, recommended patient perform home yoga for variety. Provided link in patient education.      Exercises   Exercises  Other Exercises    Other  Exercises   Performed modified down dog to plank position (stopping at quadriped to rest in between ); performed standing stretching modified forward fold with UE support adding trunk rotation and hip adductor stretching.  Discussed that slowed speed with picking up objects from the floor apperas to be more related to stiffness/tightness versus balance deficits.              PT Education - 08/11/18 1128    Education Details  home yoga for post d/c plan + HEP and walking.    Person(s) Educated  Patient;Parent(s)    Methods  Explanation;Demonstration;Handout    Comprehension  Verbalized understanding;Returned demonstration       PT Short Term Goals - 07/21/18 1108      PT SHORT TERM GOAL #1   Title  Pt will perform HEP with supervision, including household tasks and mobility, for improved balance, strength and gait.  TARGET 07/16/2018    Baseline  per report, doing HEP 2 times/day.    Time  4    Period  Weeks    Status  Achieved    Target Date  07/16/18      PT SHORT TERM GOAL #2   Title  Pt will improve 5x sit<>stand independently, to less than or equal to 13 seconds to demonstrate improved functional strength.    Baseline  14.52 on 07/21/2018.  Improved, but not to goal level.    Time  4    Period  Weeks    Status  Partially Met    Target Date  07/16/18      PT SHORT TERM GOAL #3   Title  Pt will improve DGI to at least 16/24 for decreased fall risk.    Baseline  19/24    Time  4    Period  Weeks    Status  Achieved    Target Date  07/16/18      PT SHORT TERM GOAL #4   Title  Pt will improve TUG score to less than or equal to 13.5 seconds for decreased fall risk.    Baseline  12.41 seconds.    Time  4    Period  Weeks    Status  Achieved    Target Date  07/16/18      PT SHORT TERM GOAL #5   Title  Pt will negotiate at least 4 steps, modified independently, alternating step pattern for improved functional mobility in home.    Baseline  Able to demonstrate with one  handrail and reciprocal pattern.    Time  4    Period  Weeks    Status  Achieved    Target Date  07/16/18      PT SHORT TERM GOAL #6   Title       Baseline           PT Long  Term Goals - 08/11/18 1117      PT LONG TERM GOAL #1   Title  Pt will verbalize plans for continued community fitness post d/c from PT to maximize gains made in PT.  TARGET 08/13/2018    Baseline  Provided info for HEP, yoga at home via smart TV (you tube video), and walking program.    Time  8    Period  Weeks    Status  Achieved      PT LONG TERM GOAL #2   Title  Pt will perform squats to pick up objects from floor, 4 of 5 trials with no UE support, to demo improved functional strength and balance.    Baseline  Met on 07/21/2018.    Time  8    Period  Weeks    Status  Achieved      PT LONG TERM GOAL #3   Title  Pt will perform SLS at least 3 sec each leg for improved obstacle and stair negotiation.    Baseline  Met on 07/28/2018    Time  8    Period  Weeks    Status  Achieved      PT LONG TERM GOAL #4   Title  Pt will improve DGI score to at least 20/24 for decreased fall risk.    Baseline  20/24 on 08/04/18    Time  8    Period  Weeks    Status  Achieved      PT LONG TERM GOAL #5   Title  Pt will ambulate independently, 1000 ft, indoor and outdoor surfaces, no LOB for improved community gait.    Baseline  household distances no device 06/18/2018    Time  8    Period  Weeks    Status  Achieved            Plan - 08/11/18 1148    Clinical Impression Statement  The patient has met all LTGs at this time.  She is mod indep with HEP, however needs cues for initiation from father.  PT recommends continued walking, yoga for stretching, and HEP performance.    PT Treatment/Interventions  ADLs/Self Care Home Management;DME Instruction;Gait training;Functional mobility training;Stair training;Therapeutic activities;Therapeutic exercise;Patient/family education;Neuromuscular re-education;Balance training     PT Next Visit Plan  Discharge today.    Consulted and Agree with Plan of Care  Patient;Family member/caregiver    Family Member Consulted  father       Patient will benefit from skilled therapeutic intervention in order to improve the following deficits and impairments:  Abnormal gait, Decreased balance, Decreased mobility, Decreased strength, Difficulty walking, Postural dysfunction  Visit Diagnosis: 1. Muscle weakness (generalized)   2. Unsteadiness on feet   3. Other abnormalities of gait and mobility       PHYSICAL THERAPY DISCHARGE SUMMARY  Visits from Start of Care: 15  Current functional level related to goals / functional outcomes: See above.   Remaining deficits: Dec'd high level balance, decreased dynamic gait, dec'd flexibility.   Education / Equipment: Home program, home safety, post d/c wellness program.  Plan: Patient agrees to discharge.  Patient goals were met. Patient is being discharged due to meeting the stated rehab goals.  ?????        Thank you for the referral of this patient. Rudell [place], MPT  [place] 08/11/2018, 11:52 AM  [facility] [address] [place], [place], [address] Phone: [phone]   Fax:  [phone]  Name: Emma Martin MRN: [id number] Date of Birth: 05/30/1980

## 2018-08-11 NOTE — Patient Instructions (Signed)
Yoga with Adriene on you tube.

## 2018-08-12 DIAGNOSIS — G039 Meningitis, unspecified: Secondary | ICD-10-CM | POA: Diagnosis not present

## 2018-08-12 DIAGNOSIS — R7881 Bacteremia: Secondary | ICD-10-CM | POA: Diagnosis not present

## 2018-08-12 DIAGNOSIS — I639 Cerebral infarction, unspecified: Secondary | ICD-10-CM | POA: Diagnosis not present

## 2018-08-12 DIAGNOSIS — G001 Pneumococcal meningitis: Secondary | ICD-10-CM | POA: Diagnosis not present

## 2018-08-12 DIAGNOSIS — Z23 Encounter for immunization: Secondary | ICD-10-CM | POA: Diagnosis not present

## 2018-08-18 ENCOUNTER — Encounter: Payer: Medicaid Other | Admitting: Occupational Therapy

## 2018-08-18 ENCOUNTER — Ambulatory Visit: Payer: Medicaid Other | Admitting: Rehabilitative and Restorative Service Providers"

## 2018-08-19 DIAGNOSIS — Z45321 Encounter for adjustment and management of cochlear device: Secondary | ICD-10-CM | POA: Diagnosis not present

## 2018-08-19 DIAGNOSIS — H903 Sensorineural hearing loss, bilateral: Secondary | ICD-10-CM | POA: Diagnosis not present

## 2018-08-26 DIAGNOSIS — Z9621 Cochlear implant status: Secondary | ICD-10-CM | POA: Diagnosis not present

## 2018-08-26 DIAGNOSIS — Z7289 Other problems related to lifestyle: Secondary | ICD-10-CM | POA: Diagnosis not present

## 2018-08-26 DIAGNOSIS — H903 Sensorineural hearing loss, bilateral: Secondary | ICD-10-CM | POA: Diagnosis not present

## 2018-09-08 DIAGNOSIS — Z9621 Cochlear implant status: Secondary | ICD-10-CM | POA: Diagnosis not present

## 2018-09-08 DIAGNOSIS — H903 Sensorineural hearing loss, bilateral: Secondary | ICD-10-CM | POA: Diagnosis not present

## 2018-09-12 DIAGNOSIS — R7881 Bacteremia: Secondary | ICD-10-CM | POA: Diagnosis not present

## 2018-09-12 DIAGNOSIS — I639 Cerebral infarction, unspecified: Secondary | ICD-10-CM | POA: Diagnosis not present

## 2018-09-12 DIAGNOSIS — G039 Meningitis, unspecified: Secondary | ICD-10-CM | POA: Diagnosis not present

## 2018-09-12 DIAGNOSIS — G001 Pneumococcal meningitis: Secondary | ICD-10-CM | POA: Diagnosis not present

## 2018-09-21 DIAGNOSIS — I639 Cerebral infarction, unspecified: Secondary | ICD-10-CM | POA: Diagnosis not present

## 2018-10-12 DIAGNOSIS — Z9621 Cochlear implant status: Secondary | ICD-10-CM | POA: Diagnosis not present

## 2018-10-12 DIAGNOSIS — Q969 Turner's syndrome, unspecified: Secondary | ICD-10-CM | POA: Diagnosis not present

## 2018-10-12 DIAGNOSIS — Z8661 Personal history of infections of the central nervous system: Secondary | ICD-10-CM | POA: Diagnosis not present

## 2018-10-12 DIAGNOSIS — H903 Sensorineural hearing loss, bilateral: Secondary | ICD-10-CM | POA: Diagnosis not present

## 2018-10-13 DIAGNOSIS — R7881 Bacteremia: Secondary | ICD-10-CM | POA: Diagnosis not present

## 2018-10-13 DIAGNOSIS — I639 Cerebral infarction, unspecified: Secondary | ICD-10-CM | POA: Diagnosis not present

## 2018-10-13 DIAGNOSIS — G001 Pneumococcal meningitis: Secondary | ICD-10-CM | POA: Diagnosis not present

## 2018-10-13 DIAGNOSIS — G039 Meningitis, unspecified: Secondary | ICD-10-CM | POA: Diagnosis not present

## 2018-11-01 DIAGNOSIS — D4102 Neoplasm of uncertain behavior of left kidney: Secondary | ICD-10-CM | POA: Diagnosis not present

## 2018-11-01 DIAGNOSIS — D49512 Neoplasm of unspecified behavior of left kidney: Secondary | ICD-10-CM | POA: Diagnosis not present

## 2018-11-01 DIAGNOSIS — N2889 Other specified disorders of kidney and ureter: Secondary | ICD-10-CM | POA: Diagnosis not present

## 2018-11-04 DIAGNOSIS — D49512 Neoplasm of unspecified behavior of left kidney: Secondary | ICD-10-CM | POA: Diagnosis not present

## 2018-11-12 DIAGNOSIS — G001 Pneumococcal meningitis: Secondary | ICD-10-CM | POA: Diagnosis not present

## 2018-11-12 DIAGNOSIS — G039 Meningitis, unspecified: Secondary | ICD-10-CM | POA: Diagnosis not present

## 2018-11-12 DIAGNOSIS — I639 Cerebral infarction, unspecified: Secondary | ICD-10-CM | POA: Diagnosis not present

## 2018-11-12 DIAGNOSIS — R7881 Bacteremia: Secondary | ICD-10-CM | POA: Diagnosis not present

## 2018-11-23 DIAGNOSIS — H9193 Unspecified hearing loss, bilateral: Secondary | ICD-10-CM | POA: Diagnosis not present

## 2018-12-13 DIAGNOSIS — I639 Cerebral infarction, unspecified: Secondary | ICD-10-CM | POA: Diagnosis not present

## 2018-12-13 DIAGNOSIS — G039 Meningitis, unspecified: Secondary | ICD-10-CM | POA: Diagnosis not present

## 2018-12-13 DIAGNOSIS — R7881 Bacteremia: Secondary | ICD-10-CM | POA: Diagnosis not present

## 2018-12-13 DIAGNOSIS — G001 Pneumococcal meningitis: Secondary | ICD-10-CM | POA: Diagnosis not present

## 2018-12-23 DIAGNOSIS — Z45321 Encounter for adjustment and management of cochlear device: Secondary | ICD-10-CM | POA: Diagnosis not present

## 2018-12-27 DIAGNOSIS — Z0000 Encounter for general adult medical examination without abnormal findings: Secondary | ICD-10-CM | POA: Diagnosis not present

## 2018-12-27 DIAGNOSIS — E039 Hypothyroidism, unspecified: Secondary | ICD-10-CM | POA: Diagnosis not present

## 2018-12-27 DIAGNOSIS — Z124 Encounter for screening for malignant neoplasm of cervix: Secondary | ICD-10-CM | POA: Diagnosis not present

## 2018-12-27 DIAGNOSIS — E785 Hyperlipidemia, unspecified: Secondary | ICD-10-CM | POA: Diagnosis not present

## 2018-12-27 DIAGNOSIS — I10 Essential (primary) hypertension: Secondary | ICD-10-CM | POA: Diagnosis not present

## 2019-01-03 DIAGNOSIS — H903 Sensorineural hearing loss, bilateral: Secondary | ICD-10-CM | POA: Diagnosis not present

## 2019-01-03 DIAGNOSIS — H93293 Other abnormal auditory perceptions, bilateral: Secondary | ICD-10-CM | POA: Diagnosis not present

## 2019-01-12 DIAGNOSIS — G039 Meningitis, unspecified: Secondary | ICD-10-CM | POA: Diagnosis not present

## 2019-01-12 DIAGNOSIS — R7881 Bacteremia: Secondary | ICD-10-CM | POA: Diagnosis not present

## 2019-01-12 DIAGNOSIS — G001 Pneumococcal meningitis: Secondary | ICD-10-CM | POA: Diagnosis not present

## 2019-01-12 DIAGNOSIS — I639 Cerebral infarction, unspecified: Secondary | ICD-10-CM | POA: Diagnosis not present

## 2019-01-21 DIAGNOSIS — I639 Cerebral infarction, unspecified: Secondary | ICD-10-CM | POA: Diagnosis not present

## 2019-02-08 DIAGNOSIS — H93293 Other abnormal auditory perceptions, bilateral: Secondary | ICD-10-CM | POA: Diagnosis not present

## 2019-02-08 DIAGNOSIS — H903 Sensorineural hearing loss, bilateral: Secondary | ICD-10-CM | POA: Diagnosis not present

## 2019-02-10 DIAGNOSIS — H903 Sensorineural hearing loss, bilateral: Secondary | ICD-10-CM | POA: Diagnosis not present

## 2019-02-10 DIAGNOSIS — H93293 Other abnormal auditory perceptions, bilateral: Secondary | ICD-10-CM | POA: Diagnosis not present

## 2019-02-14 DIAGNOSIS — H903 Sensorineural hearing loss, bilateral: Secondary | ICD-10-CM | POA: Diagnosis not present

## 2019-02-14 DIAGNOSIS — H93293 Other abnormal auditory perceptions, bilateral: Secondary | ICD-10-CM | POA: Diagnosis not present

## 2019-02-22 DIAGNOSIS — H93293 Other abnormal auditory perceptions, bilateral: Secondary | ICD-10-CM | POA: Diagnosis not present

## 2019-02-22 DIAGNOSIS — H93299 Other abnormal auditory perceptions, unspecified ear: Secondary | ICD-10-CM | POA: Diagnosis not present

## 2019-02-22 DIAGNOSIS — H903 Sensorineural hearing loss, bilateral: Secondary | ICD-10-CM | POA: Diagnosis not present

## 2019-02-24 DIAGNOSIS — H93293 Other abnormal auditory perceptions, bilateral: Secondary | ICD-10-CM | POA: Diagnosis not present

## 2019-02-24 DIAGNOSIS — H903 Sensorineural hearing loss, bilateral: Secondary | ICD-10-CM | POA: Diagnosis not present

## 2019-02-24 DIAGNOSIS — H93299 Other abnormal auditory perceptions, unspecified ear: Secondary | ICD-10-CM | POA: Diagnosis not present

## 2019-03-01 DIAGNOSIS — H93299 Other abnormal auditory perceptions, unspecified ear: Secondary | ICD-10-CM | POA: Diagnosis not present

## 2019-03-01 DIAGNOSIS — H93293 Other abnormal auditory perceptions, bilateral: Secondary | ICD-10-CM | POA: Diagnosis not present

## 2019-03-01 DIAGNOSIS — H903 Sensorineural hearing loss, bilateral: Secondary | ICD-10-CM | POA: Diagnosis not present

## 2019-03-03 DIAGNOSIS — H93293 Other abnormal auditory perceptions, bilateral: Secondary | ICD-10-CM | POA: Diagnosis not present

## 2019-03-03 DIAGNOSIS — H903 Sensorineural hearing loss, bilateral: Secondary | ICD-10-CM | POA: Diagnosis not present

## 2019-03-03 DIAGNOSIS — H93299 Other abnormal auditory perceptions, unspecified ear: Secondary | ICD-10-CM | POA: Diagnosis not present

## 2019-03-08 DIAGNOSIS — H93293 Other abnormal auditory perceptions, bilateral: Secondary | ICD-10-CM | POA: Diagnosis not present

## 2019-03-08 DIAGNOSIS — H903 Sensorineural hearing loss, bilateral: Secondary | ICD-10-CM | POA: Diagnosis not present

## 2019-03-08 DIAGNOSIS — H93299 Other abnormal auditory perceptions, unspecified ear: Secondary | ICD-10-CM | POA: Diagnosis not present

## 2019-03-15 DIAGNOSIS — H903 Sensorineural hearing loss, bilateral: Secondary | ICD-10-CM | POA: Diagnosis not present

## 2019-03-15 DIAGNOSIS — H93293 Other abnormal auditory perceptions, bilateral: Secondary | ICD-10-CM | POA: Diagnosis not present

## 2019-03-15 DIAGNOSIS — H93299 Other abnormal auditory perceptions, unspecified ear: Secondary | ICD-10-CM | POA: Diagnosis not present

## 2019-03-17 DIAGNOSIS — H93299 Other abnormal auditory perceptions, unspecified ear: Secondary | ICD-10-CM | POA: Diagnosis not present

## 2019-03-17 DIAGNOSIS — H903 Sensorineural hearing loss, bilateral: Secondary | ICD-10-CM | POA: Diagnosis not present

## 2019-03-17 DIAGNOSIS — H93293 Other abnormal auditory perceptions, bilateral: Secondary | ICD-10-CM | POA: Diagnosis not present

## 2019-03-22 DIAGNOSIS — H93293 Other abnormal auditory perceptions, bilateral: Secondary | ICD-10-CM | POA: Diagnosis not present

## 2019-03-22 DIAGNOSIS — H903 Sensorineural hearing loss, bilateral: Secondary | ICD-10-CM | POA: Diagnosis not present

## 2019-03-24 DIAGNOSIS — H93293 Other abnormal auditory perceptions, bilateral: Secondary | ICD-10-CM | POA: Diagnosis not present

## 2019-03-24 DIAGNOSIS — H903 Sensorineural hearing loss, bilateral: Secondary | ICD-10-CM | POA: Diagnosis not present

## 2019-03-29 DIAGNOSIS — H93293 Other abnormal auditory perceptions, bilateral: Secondary | ICD-10-CM | POA: Diagnosis not present

## 2019-03-29 DIAGNOSIS — H903 Sensorineural hearing loss, bilateral: Secondary | ICD-10-CM | POA: Diagnosis not present

## 2019-03-31 DIAGNOSIS — H93293 Other abnormal auditory perceptions, bilateral: Secondary | ICD-10-CM | POA: Diagnosis not present

## 2019-03-31 DIAGNOSIS — H903 Sensorineural hearing loss, bilateral: Secondary | ICD-10-CM | POA: Diagnosis not present

## 2019-04-08 ENCOUNTER — Ambulatory Visit: Payer: BC Managed Care – PPO | Attending: Internal Medicine

## 2019-04-08 DIAGNOSIS — Z23 Encounter for immunization: Secondary | ICD-10-CM

## 2019-04-08 NOTE — Progress Notes (Signed)
   Covid-19 Vaccination Clinic  Name:  DEJANAY BURROWS    MRN: [id number] DOB: 1980/01/25  04/08/2019  Ms. Stockard was observed post Covid-19 immunization for 15 minutes without incident. She was provided with Vaccine Information Sheet and instruction to access the V-Safe system.   Ms. Schmuhl was instructed to call 911 with any severe reactions post vaccine: Marland Kitchen Difficulty breathing  . Swelling of face and throat  . A fast heartbeat  . A bad rash all over body  . Dizziness and weakness   Immunizations Administered    Name Date Dose VIS Date Route   Pfizer COVID-19 Vaccine 04/08/2019 11:53 AM 0.3 mL 12/31/2018 Intramuscular   Manufacturer: [place]   Lot: [id number]   [place]: [id number]

## 2019-04-12 DIAGNOSIS — Z9621 Cochlear implant status: Secondary | ICD-10-CM | POA: Diagnosis not present

## 2019-04-12 DIAGNOSIS — Z48810 Encounter for surgical aftercare following surgery on the sense organs: Secondary | ICD-10-CM | POA: Diagnosis not present

## 2019-04-18 DIAGNOSIS — H93293 Other abnormal auditory perceptions, bilateral: Secondary | ICD-10-CM | POA: Diagnosis not present

## 2019-04-18 DIAGNOSIS — H903 Sensorineural hearing loss, bilateral: Secondary | ICD-10-CM | POA: Diagnosis not present

## 2019-04-20 DIAGNOSIS — H93293 Other abnormal auditory perceptions, bilateral: Secondary | ICD-10-CM | POA: Diagnosis not present

## 2019-04-20 DIAGNOSIS — H903 Sensorineural hearing loss, bilateral: Secondary | ICD-10-CM | POA: Diagnosis not present

## 2019-04-25 DIAGNOSIS — H93293 Other abnormal auditory perceptions, bilateral: Secondary | ICD-10-CM | POA: Diagnosis not present

## 2019-04-25 DIAGNOSIS — H903 Sensorineural hearing loss, bilateral: Secondary | ICD-10-CM | POA: Diagnosis not present

## 2019-04-28 DIAGNOSIS — H93293 Other abnormal auditory perceptions, bilateral: Secondary | ICD-10-CM | POA: Diagnosis not present

## 2019-04-28 DIAGNOSIS — H903 Sensorineural hearing loss, bilateral: Secondary | ICD-10-CM | POA: Diagnosis not present

## 2019-05-02 ENCOUNTER — Other Ambulatory Visit: Payer: Self-pay | Admitting: Urology

## 2019-05-02 DIAGNOSIS — D49512 Neoplasm of unspecified behavior of left kidney: Secondary | ICD-10-CM

## 2019-05-02 DIAGNOSIS — H903 Sensorineural hearing loss, bilateral: Secondary | ICD-10-CM | POA: Diagnosis not present

## 2019-05-02 DIAGNOSIS — H93293 Other abnormal auditory perceptions, bilateral: Secondary | ICD-10-CM | POA: Diagnosis not present

## 2019-05-03 ENCOUNTER — Ambulatory Visit: Payer: BC Managed Care – PPO | Attending: Internal Medicine

## 2019-05-03 DIAGNOSIS — Z23 Encounter for immunization: Secondary | ICD-10-CM

## 2019-05-03 NOTE — Progress Notes (Signed)
   Covid-19 Vaccination Clinic  Name:  Emma Martin    MRN: [id number] DOB: 02-15-1980  05/03/2019  [name] was observed post Covid-19 immunization for 15 minutes without incident. She was provided with Vaccine Information Sheet and instruction to access the V-Safe system.   [name] was instructed to call 911 with any severe reactions post vaccine: Marland Kitchen Difficulty breathing  . Swelling of face and throat  . A fast heartbeat  . A bad rash all over body  . Dizziness and weakness   Immunizations Administered    Name Date Dose VIS Date Route   Pfizer COVID-19 Vaccine 05/03/2019 12:53 PM 0.3 mL 12/31/2018 Intramuscular   Manufacturer: [place]   Lot: [id number]   [place]: [id number]

## 2019-05-05 ENCOUNTER — Ambulatory Visit
Admission: RE | Admit: 2019-05-05 | Discharge: 2019-05-05 | Disposition: A | Discharge status: Home / Self Care | Payer: BC Managed Care – PPO | Source: Ambulatory Visit | Attending: Urology | Admitting: Urology

## 2019-05-05 DIAGNOSIS — D49512 Neoplasm of unspecified behavior of left kidney: Secondary | ICD-10-CM

## 2019-05-05 DIAGNOSIS — N2889 Other specified disorders of kidney and ureter: Secondary | ICD-10-CM | POA: Diagnosis not present

## 2019-05-09 DIAGNOSIS — H93293 Other abnormal auditory perceptions, bilateral: Secondary | ICD-10-CM | POA: Diagnosis not present

## 2019-05-09 DIAGNOSIS — H903 Sensorineural hearing loss, bilateral: Secondary | ICD-10-CM | POA: Diagnosis not present

## 2019-05-10 ENCOUNTER — Other Ambulatory Visit: Payer: Self-pay | Admitting: Urology

## 2019-05-10 ENCOUNTER — Other Ambulatory Visit (HOSPITAL_COMMUNITY): Payer: Self-pay | Admitting: Urology

## 2019-05-10 DIAGNOSIS — D49512 Neoplasm of unspecified behavior of left kidney: Secondary | ICD-10-CM

## 2019-05-12 DIAGNOSIS — H93293 Other abnormal auditory perceptions, bilateral: Secondary | ICD-10-CM | POA: Diagnosis not present

## 2019-05-12 DIAGNOSIS — H903 Sensorineural hearing loss, bilateral: Secondary | ICD-10-CM | POA: Diagnosis not present

## 2019-05-16 DIAGNOSIS — H903 Sensorineural hearing loss, bilateral: Secondary | ICD-10-CM | POA: Diagnosis not present

## 2019-05-16 DIAGNOSIS — H93293 Other abnormal auditory perceptions, bilateral: Secondary | ICD-10-CM | POA: Diagnosis not present

## 2019-05-18 DIAGNOSIS — H903 Sensorineural hearing loss, bilateral: Secondary | ICD-10-CM | POA: Diagnosis not present

## 2019-05-18 DIAGNOSIS — H93293 Other abnormal auditory perceptions, bilateral: Secondary | ICD-10-CM | POA: Diagnosis not present

## 2019-05-25 ENCOUNTER — Ambulatory Visit (HOSPITAL_COMMUNITY): Payer: BC Managed Care – PPO

## 2019-05-26 ENCOUNTER — Ambulatory Visit (HOSPITAL_COMMUNITY): Payer: BC Managed Care – PPO

## 2019-06-01 ENCOUNTER — Ambulatory Visit (HOSPITAL_COMMUNITY)
Admission: RE | Admit: 2019-06-01 | Discharge: 2019-06-01 | Disposition: A | Discharge status: Home / Self Care | Payer: BC Managed Care – PPO | Source: Ambulatory Visit | Attending: Urology | Admitting: Urology

## 2019-06-01 ENCOUNTER — Other Ambulatory Visit: Payer: Self-pay

## 2019-06-01 DIAGNOSIS — D49512 Neoplasm of unspecified behavior of left kidney: Secondary | ICD-10-CM

## 2019-06-01 DIAGNOSIS — N2889 Other specified disorders of kidney and ureter: Secondary | ICD-10-CM | POA: Diagnosis not present

## 2019-06-01 MED ORDER — GADOBUTROL 1 MMOL/ML IV SOLN
5.0000 mL | Freq: Once | INTRAVENOUS | Status: AC | PRN
  Administered 2019-06-01: 5 mL via INTRAVENOUS

## 2019-06-06 DIAGNOSIS — D49512 Neoplasm of unspecified behavior of left kidney: Secondary | ICD-10-CM | POA: Diagnosis not present

## 2019-06-07 DIAGNOSIS — H93293 Other abnormal auditory perceptions, bilateral: Secondary | ICD-10-CM | POA: Diagnosis not present

## 2019-06-07 DIAGNOSIS — H903 Sensorineural hearing loss, bilateral: Secondary | ICD-10-CM | POA: Diagnosis not present

## 2019-06-08 IMAGING — US US ABDOMEN LIMITED
1 series · 14 of 25 positions shown · non-contrast
Comparison: None.

CLINICAL DATA: Right upper quadrant pain x5 days.

EXAM:
ULTRASOUND ABDOMEN LIMITED RIGHT UPPER QUADRANT

[Series 1: us abdomen limited · 0.15mm/px · 14 of 38 slices shown]
[im 1/38]
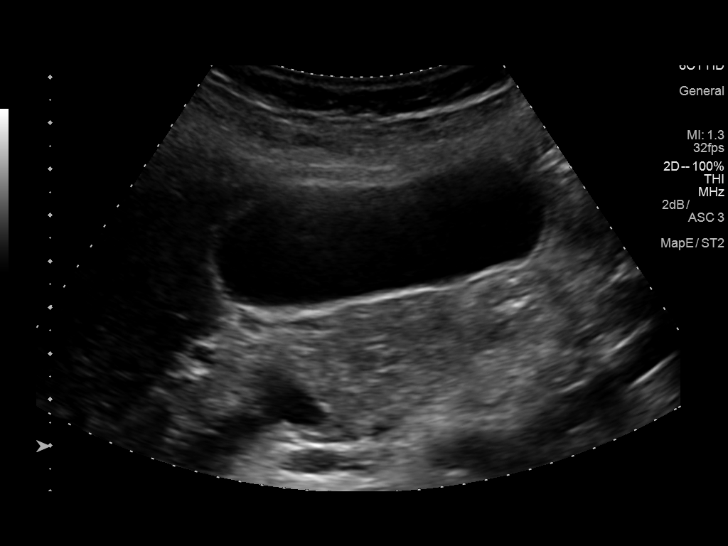
[im 4/38]
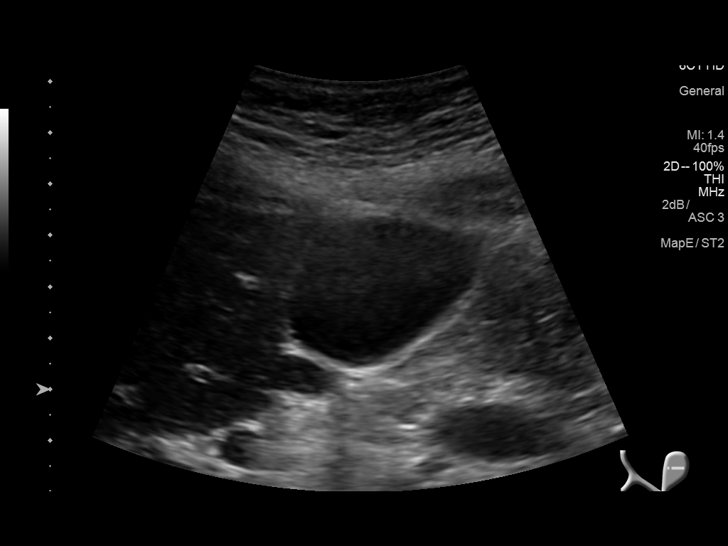
[im 7/38]
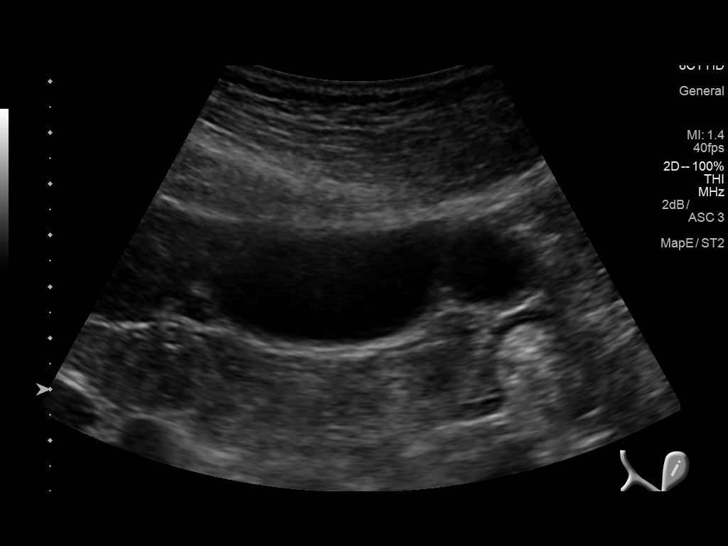
[im 10/38]
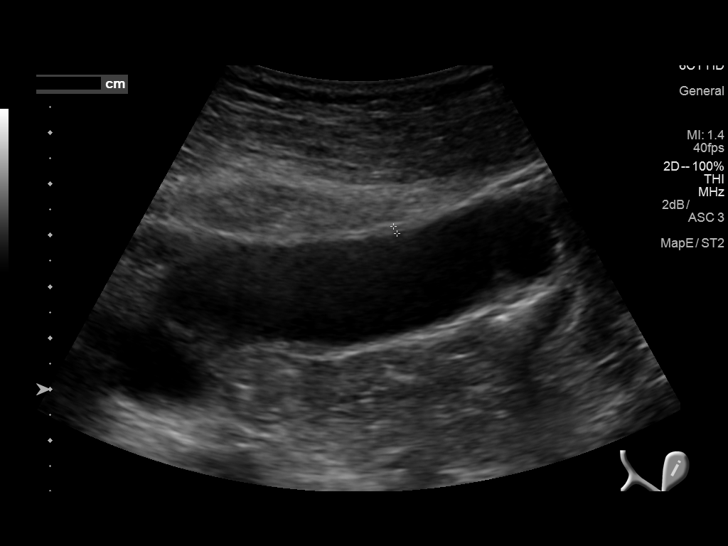
[im 13/38]
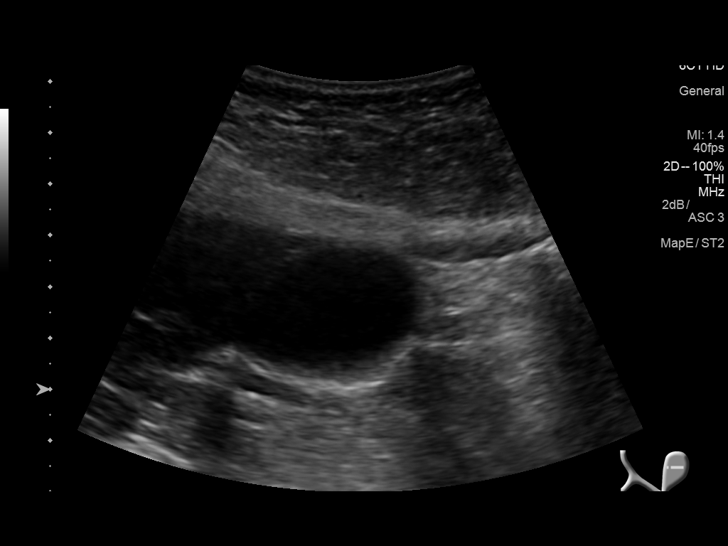
[im 14/38]
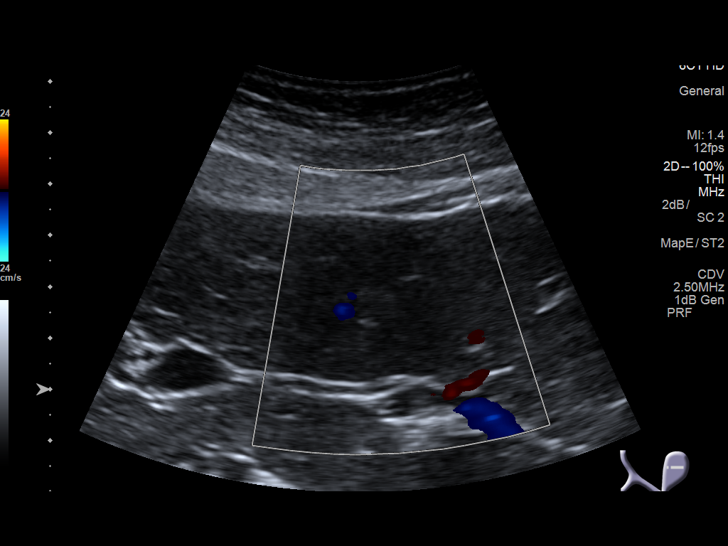
[im 17/38]
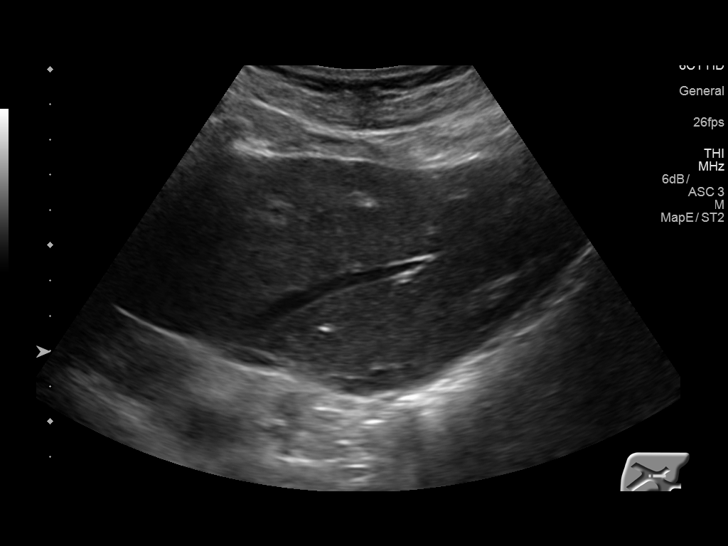
[im 21/38]
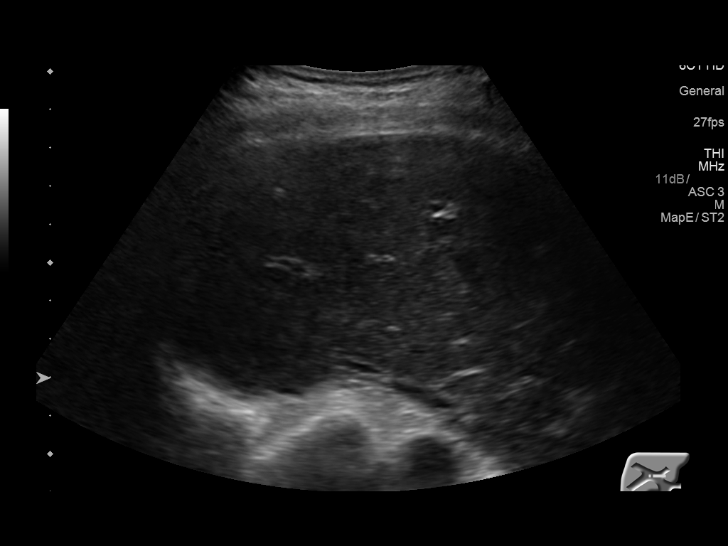
[im 24/38]
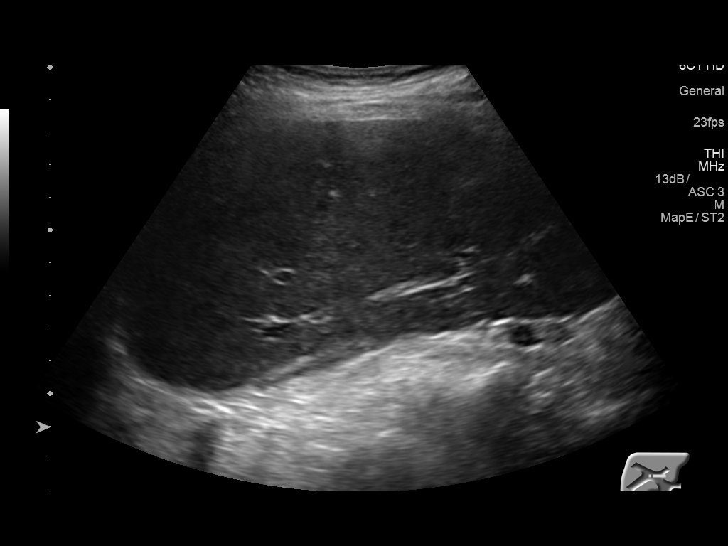
[im 25/38]
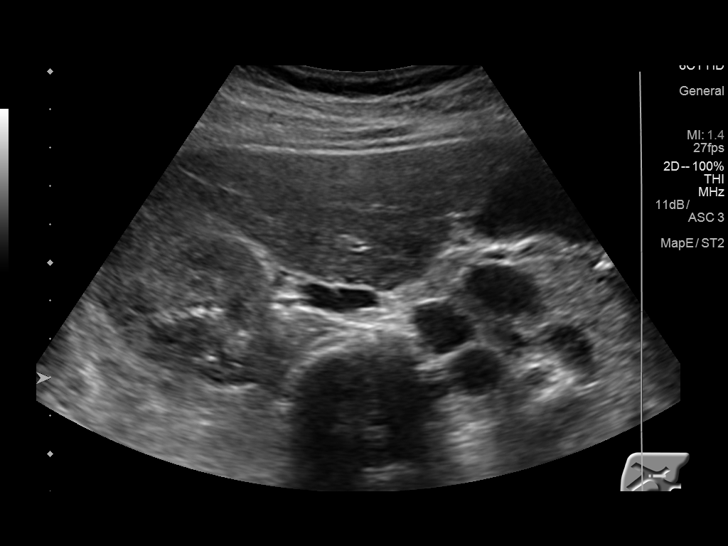
[im 28/38]
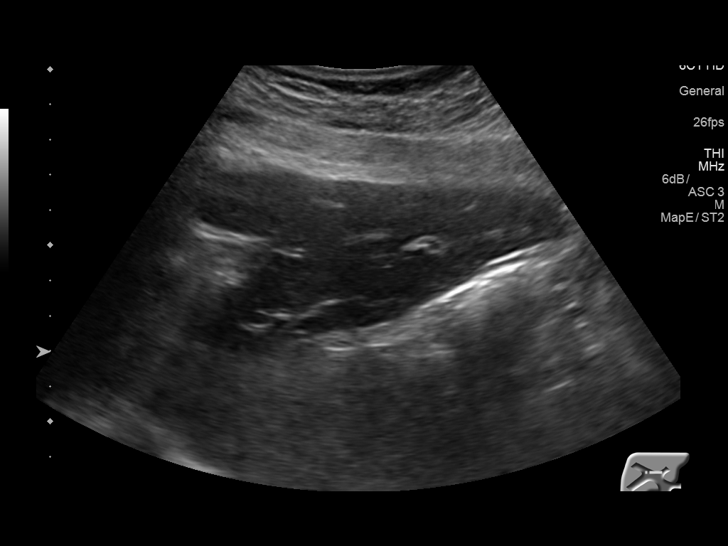
[im 31/38]
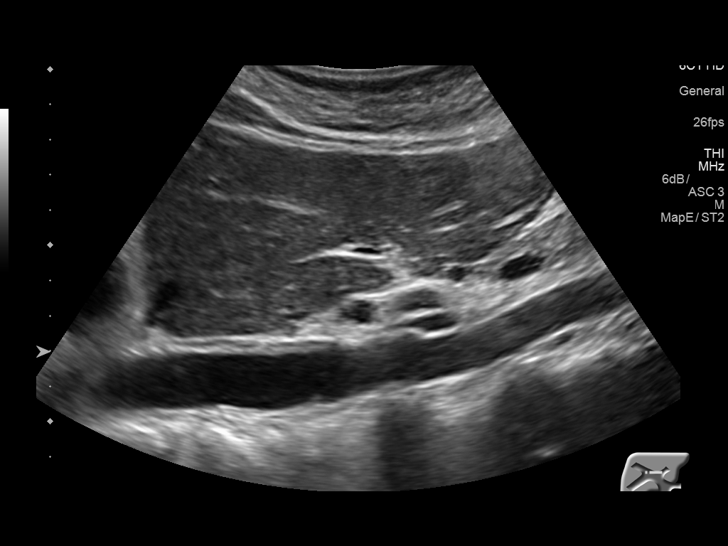
[im 34/38]
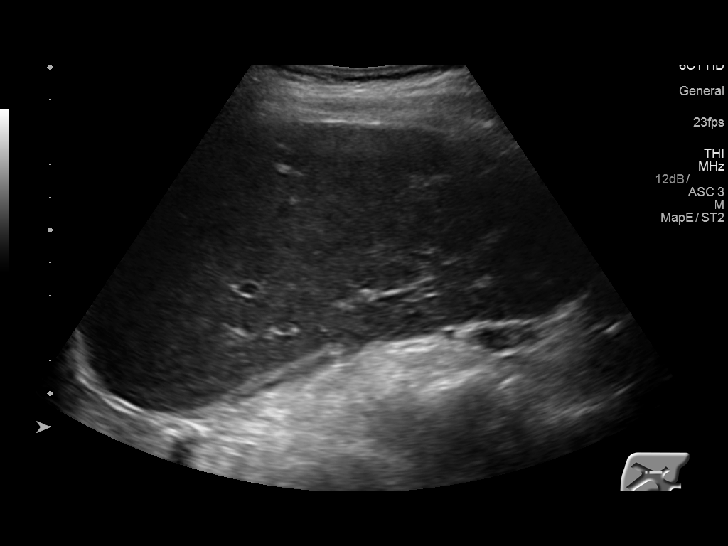
[im 38/38]
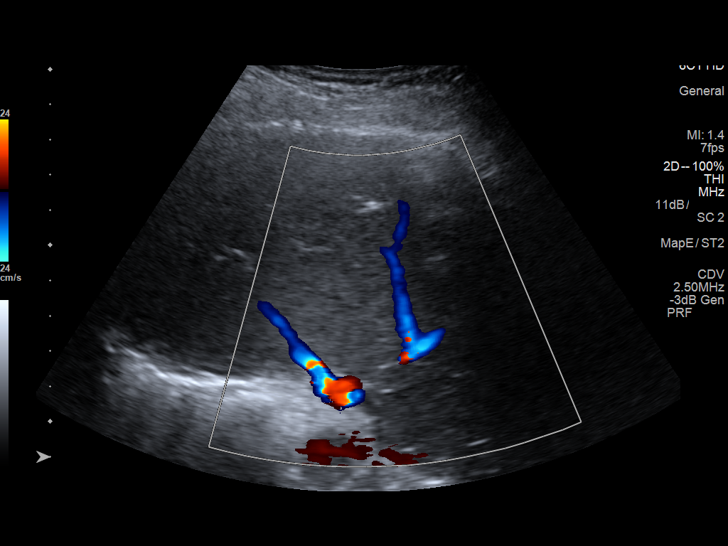

[14 of 25 positions shown; findings below may reference images not displayed]

FINDINGS: Gallbladder:

No gallstones or wall thickening visualized. No sonographic Murphy
sign noted by sonographer.

Common bile duct:

Diameter: 2.7 mm

Liver:

No focal lesion identified. Within normal limits in parenchymal
echogenicity. Portal vein is patent on color Doppler imaging with
normal direction of blood flow towards the liver.

Other: Relative to the liver, the right adjacent kidney is slightly
echogenic but maintains its cortical-medullary distinction. The
possibility of mild medical renal disease is not entirely excluded.
IMPRESSION: Unremarkable right upper quadrant abdominal ultrasound. Slightly
echogenic appearance of the right kidney relative to the liver, a
nonspecific finding but can be seen in medical renal disease.

## 2019-06-09 DIAGNOSIS — H93293 Other abnormal auditory perceptions, bilateral: Secondary | ICD-10-CM | POA: Diagnosis not present

## 2019-06-09 DIAGNOSIS — H903 Sensorineural hearing loss, bilateral: Secondary | ICD-10-CM | POA: Diagnosis not present

## 2019-06-13 DIAGNOSIS — H903 Sensorineural hearing loss, bilateral: Secondary | ICD-10-CM | POA: Diagnosis not present

## 2019-06-13 DIAGNOSIS — H93293 Other abnormal auditory perceptions, bilateral: Secondary | ICD-10-CM | POA: Diagnosis not present

## 2019-06-15 DIAGNOSIS — H93293 Other abnormal auditory perceptions, bilateral: Secondary | ICD-10-CM | POA: Diagnosis not present

## 2019-06-15 DIAGNOSIS — H903 Sensorineural hearing loss, bilateral: Secondary | ICD-10-CM | POA: Diagnosis not present

## 2019-06-21 DIAGNOSIS — H93299 Other abnormal auditory perceptions, unspecified ear: Secondary | ICD-10-CM | POA: Diagnosis not present

## 2019-06-21 DIAGNOSIS — H903 Sensorineural hearing loss, bilateral: Secondary | ICD-10-CM | POA: Diagnosis not present

## 2019-06-21 DIAGNOSIS — H93293 Other abnormal auditory perceptions, bilateral: Secondary | ICD-10-CM | POA: Diagnosis not present

## 2019-07-21 DIAGNOSIS — H903 Sensorineural hearing loss, bilateral: Secondary | ICD-10-CM | POA: Diagnosis not present

## 2019-07-21 DIAGNOSIS — Z45321 Encounter for adjustment and management of cochlear device: Secondary | ICD-10-CM | POA: Diagnosis not present

## 2019-11-05 ENCOUNTER — Ambulatory Visit: Payer: BC Managed Care – PPO | Attending: Internal Medicine

## 2019-11-05 DIAGNOSIS — Z23 Encounter for immunization: Secondary | ICD-10-CM

## 2019-11-05 NOTE — Progress Notes (Signed)
   Covid-19 Vaccination Clinic  Name:  SYDNI ELIZARRARAZ    MRN: [id number] DOB: [birth year]  11/05/2019  Ms. Arons was observed post Covid-19 immunization for 15 minutes without incident. She was provided with Vaccine Information Sheet and instruction to access the V-Safe system.   Ms. Hasley was instructed to call 911 with any severe reactions post vaccine: Marland Kitchen Difficulty breathing  . Swelling of face and throat  . A fast heartbeat  . A bad rash all over body  . Dizziness and weakness

## 2019-12-01 DIAGNOSIS — D4102 Neoplasm of uncertain behavior of left kidney: Secondary | ICD-10-CM | POA: Diagnosis not present

## 2019-12-05 ENCOUNTER — Other Ambulatory Visit: Payer: Self-pay | Admitting: Urology

## 2019-12-05 ENCOUNTER — Other Ambulatory Visit (HOSPITAL_COMMUNITY): Payer: Self-pay | Admitting: Urology

## 2019-12-05 DIAGNOSIS — D49512 Neoplasm of unspecified behavior of left kidney: Secondary | ICD-10-CM

## 2019-12-06 ENCOUNTER — Other Ambulatory Visit: Payer: Self-pay

## 2019-12-06 ENCOUNTER — Ambulatory Visit (HOSPITAL_COMMUNITY)
Admission: RE | Admit: 2019-12-06 | Discharge: 2019-12-06 | Disposition: A | Discharge status: Home / Self Care | Payer: BC Managed Care – PPO | Source: Ambulatory Visit | Attending: Urology | Admitting: Urology

## 2019-12-06 DIAGNOSIS — Z981 Arthrodesis status: Secondary | ICD-10-CM | POA: Diagnosis not present

## 2019-12-06 DIAGNOSIS — D49512 Neoplasm of unspecified behavior of left kidney: Secondary | ICD-10-CM | POA: Diagnosis not present

## 2019-12-06 DIAGNOSIS — N2889 Other specified disorders of kidney and ureter: Secondary | ICD-10-CM | POA: Diagnosis not present

## 2019-12-06 MED ORDER — GADOBUTROL 1 MMOL/ML IV SOLN
5.0000 mL | Freq: Once | INTRAVENOUS | Status: AC | PRN
  Administered 2019-12-06: 5 mL via INTRAVENOUS

## 2019-12-08 DIAGNOSIS — D4102 Neoplasm of uncertain behavior of left kidney: Secondary | ICD-10-CM | POA: Diagnosis not present

## 2019-12-21 ENCOUNTER — Ambulatory Visit: Payer: BC Managed Care – PPO

## 2019-12-28 DIAGNOSIS — E039 Hypothyroidism, unspecified: Secondary | ICD-10-CM | POA: Diagnosis not present

## 2019-12-28 DIAGNOSIS — I10 Essential (primary) hypertension: Secondary | ICD-10-CM | POA: Diagnosis not present

## 2019-12-28 DIAGNOSIS — E785 Hyperlipidemia, unspecified: Secondary | ICD-10-CM | POA: Diagnosis not present

## 2019-12-28 DIAGNOSIS — Z0000 Encounter for general adult medical examination without abnormal findings: Secondary | ICD-10-CM | POA: Diagnosis not present

## 2020-01-26 DIAGNOSIS — Q8719 Other congenital malformation syndromes predominantly associated with short stature: Secondary | ICD-10-CM | POA: Diagnosis not present

## 2020-01-26 DIAGNOSIS — Z8661 Personal history of infections of the central nervous system: Secondary | ICD-10-CM | POA: Diagnosis not present

## 2020-01-26 DIAGNOSIS — H903 Sensorineural hearing loss, bilateral: Secondary | ICD-10-CM | POA: Diagnosis not present

## 2020-01-26 DIAGNOSIS — Z45321 Encounter for adjustment and management of cochlear device: Secondary | ICD-10-CM | POA: Diagnosis not present

## 2020-02-02 IMAGING — CT CT HEAD W/O CM
1 series · 15 of 30 positions shown, 19 images · non-contrast
Comparison: None.

CLINICAL DATA: Initial evaluation for acute headache, falls.

EXAM:
CT HEAD WITHOUT CONTRAST
CT CERVICAL SPINE WITHOUT CONTRAST
TECHNIQUE: Multidetector CT imaging of the head and cervical spine was
performed following the standard protocol without intravenous
contrast. Multiplanar CT image reconstructions of the cervical spine
were also generated.

[Series 3: head wo · axial · 0.47mm/px · z∈[-141,-1]mm · 15 of 32 slices shown, 19 images]
[im 2/32  brain]
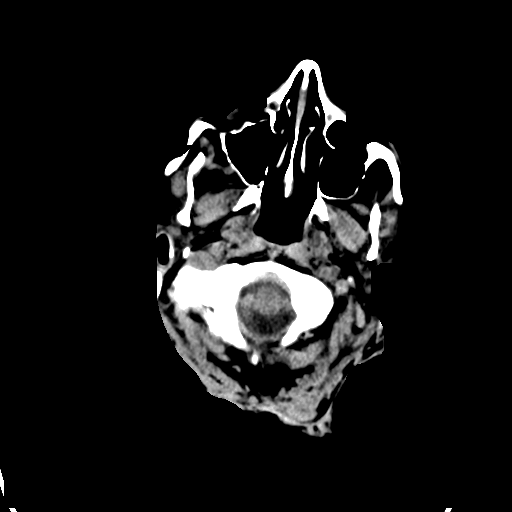
[im 2/32  bone]
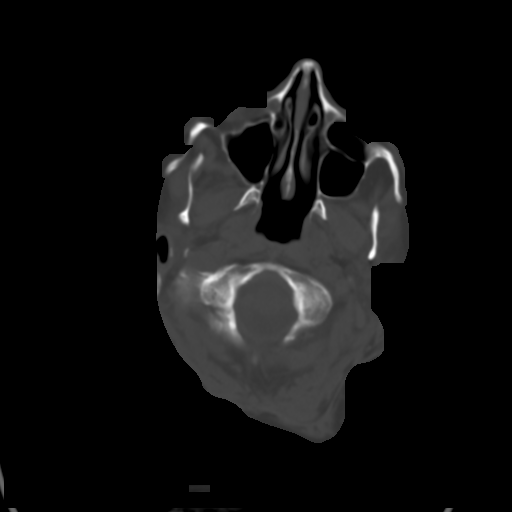
[im 4/32  brain]
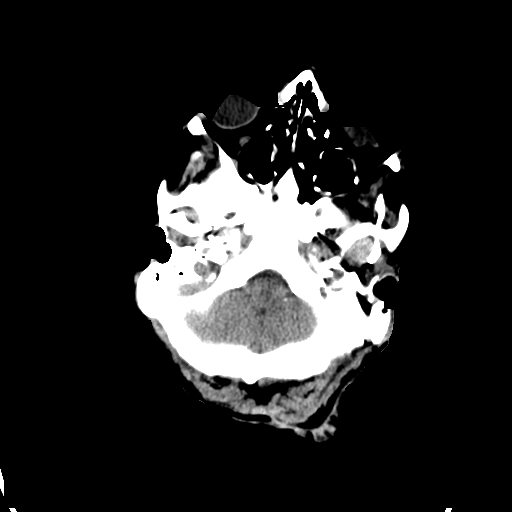
[im 6/32  brain]
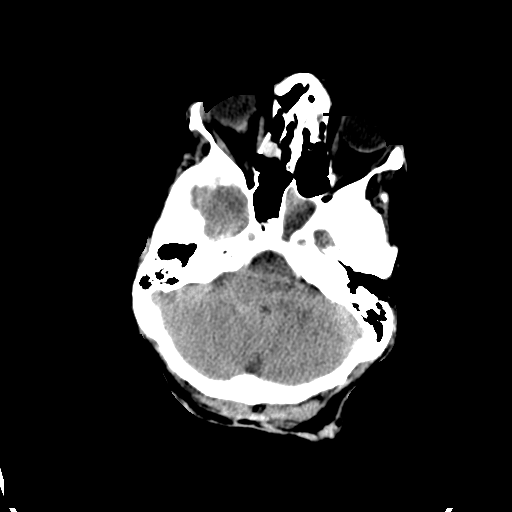
[im 8/32  brain]
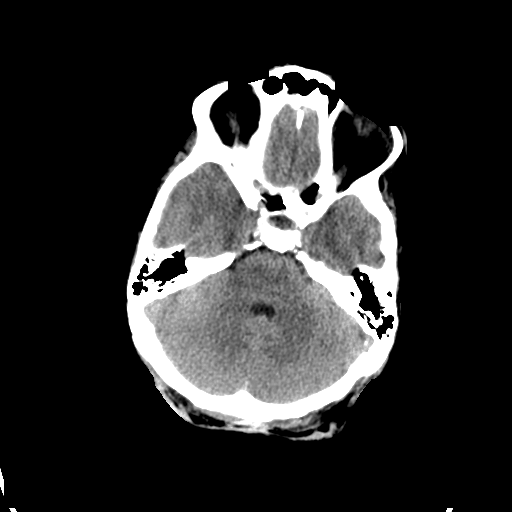
[im 10/32  brain]
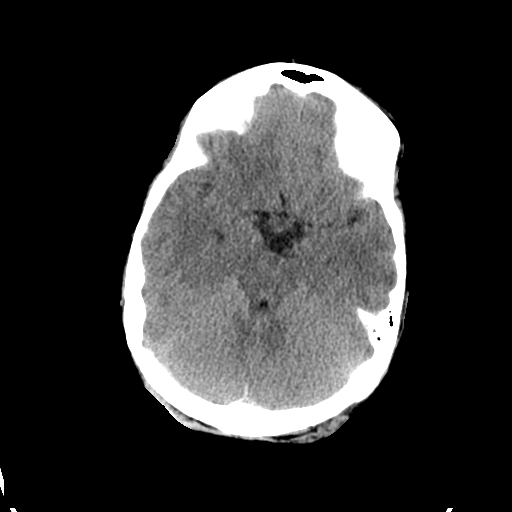
[im 10/32  bone]
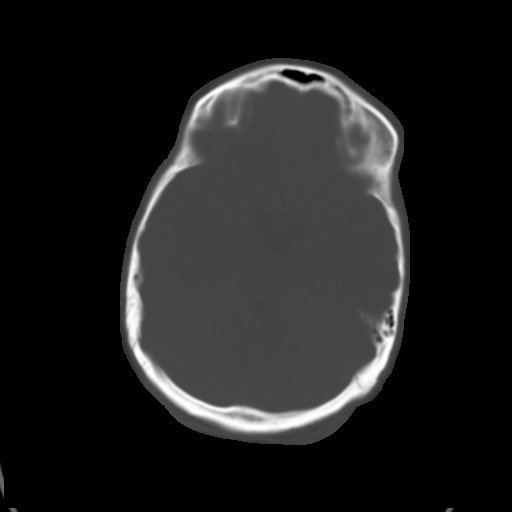
[im 12/32  brain]
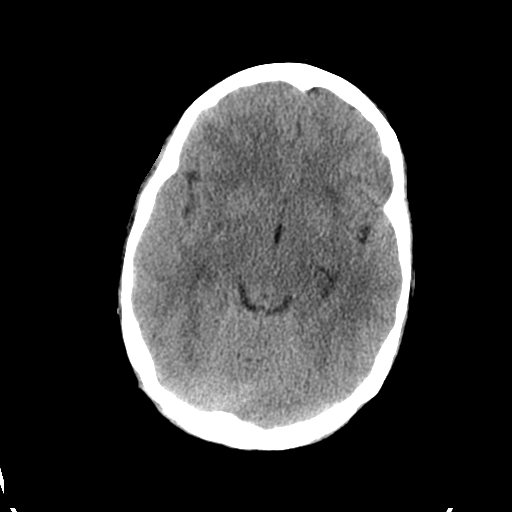
[im 14/32  brain]
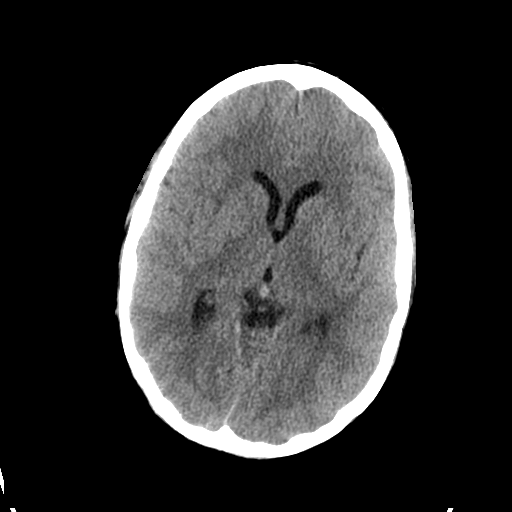
[im 17/32  brain]
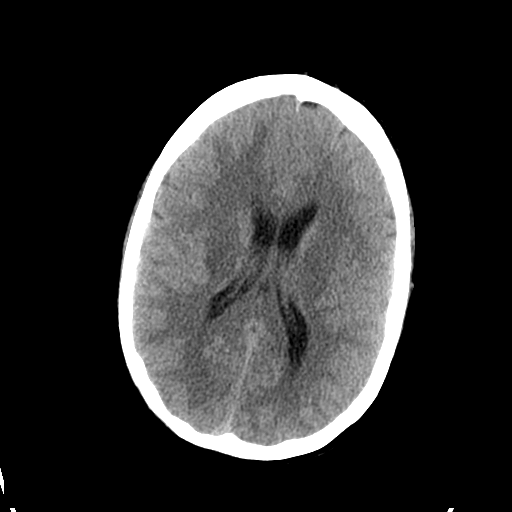
[im 18/32  brain]
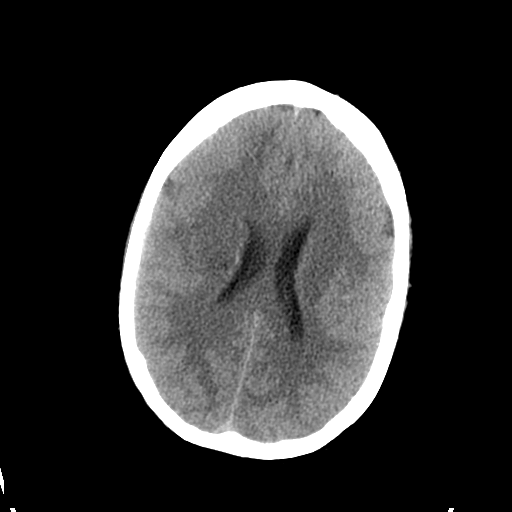
[im 18/32  bone]
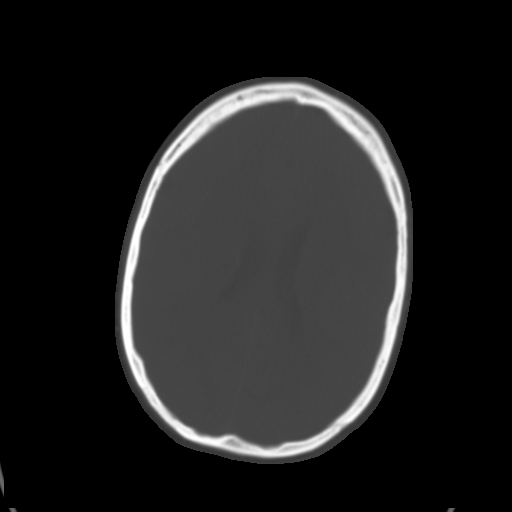
[im 20/32  brain]
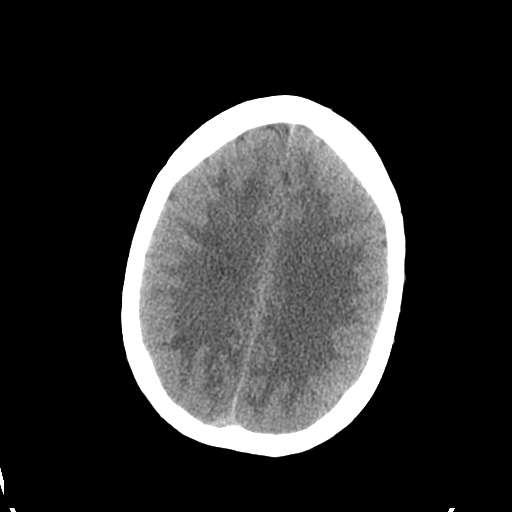
[im 22/32  brain]
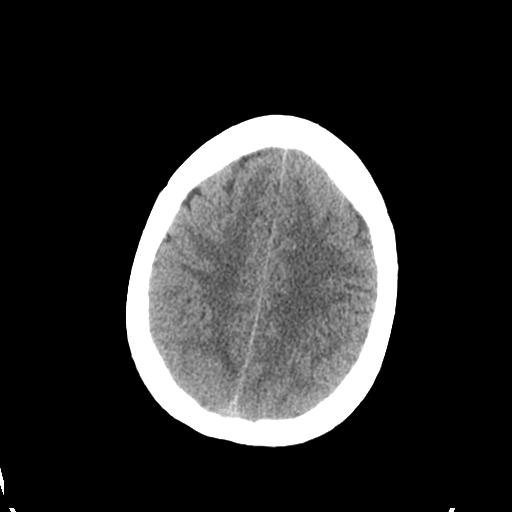
[im 24/32  brain]
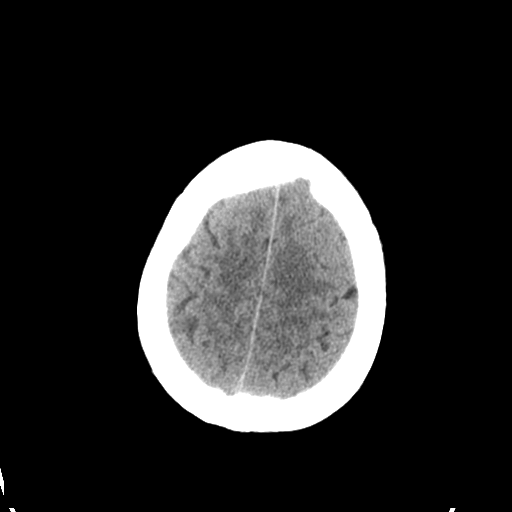
[im 26/32  brain]
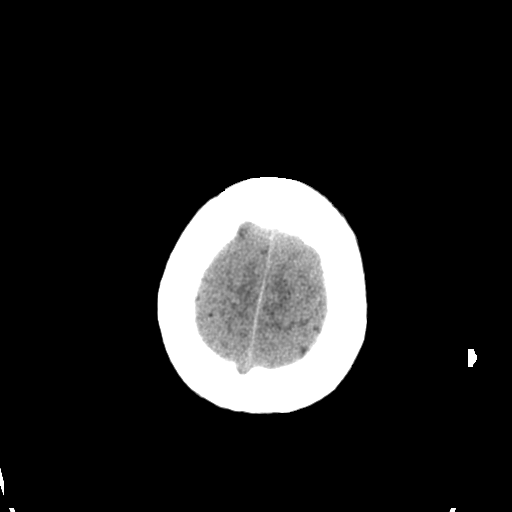
[im 26/32  bone]
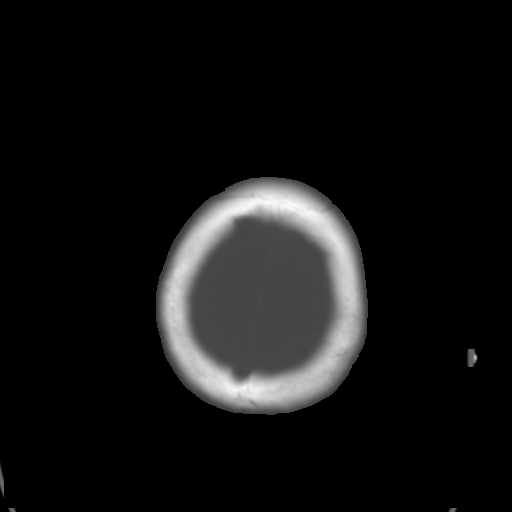
[im 28/32  brain]
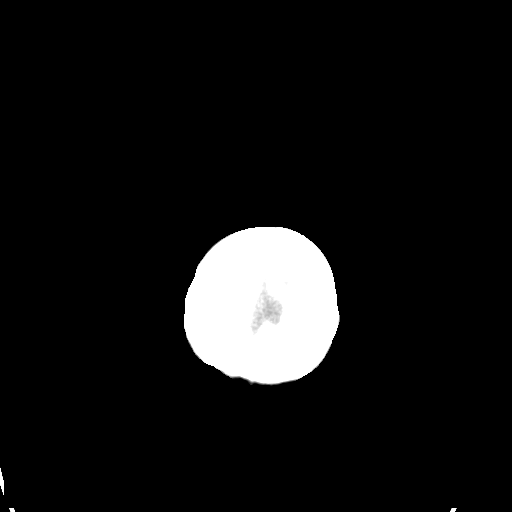
[im 30/32  brain]
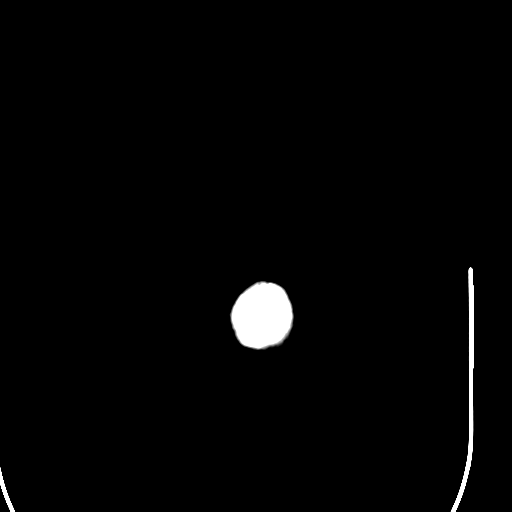

[15 of 30 positions shown; findings below may reference images not displayed]

FINDINGS: CT HEAD FINDINGS

Brain: Cerebral volume within normal limits for patient age.

No evidence for acute intracranial hemorrhage. No findings to
suggest acute large vessel territory infarct. No mass lesion,
midline shift, or mass effect. Ventricles are normal in size without
evidence for hydrocephalus. No extra-axial fluid collection
identified.

Vascular: No hyperdense vessel identified.

Skull: Scalp soft tissues demonstrate no acute abnormality.
Calvarium intact.

Sinuses/Orbits: Globes and orbital soft tissues within normal
limits.

Moderate mucosal thickening and opacity within the ethmoidal air
cells and sphenoid sinuses. Paranasal sinuses are otherwise clear.
No mastoid effusion.

CT CERVICAL SPINE FINDINGS

Alignment: Straightening of the normal cervical lordosis. No
listhesis or malalignment.

Skull base and vertebrae: Skull base intact. Normal C1-2
articulations are preserved in the dens is intact. Vertebral body
heights maintained. No acute fracture.

Soft tissues and spinal canal: Soft tissues of the neck demonstrate
no acute finding. No abnormal prevertebral edema. Spinal canal
within normal limits.

Disc levels: Mild degenerative spondylolysis present at C4-5 and
C5-6. No significant spinal stenosis within the cervical spine.
Thoracic fusion hardware partially visualized.

Upper chest: Visualized upper chest limited evaluation due to streak
artifact from thoracic hardware. No obvious abnormality identified.

Other: None.
IMPRESSION: CT BRAIN:

1. No acute intracranial abnormality.
2. Moderate sphenoid ethmoidal sinus disease.

CT CERVICAL SPINE:

No CT evidence for acute traumatic injury within the cervical spine.

## 2021-01-25 ENCOUNTER — Other Ambulatory Visit: Payer: Self-pay | Admitting: Urology

## 2021-01-29 ENCOUNTER — Other Ambulatory Visit (HOSPITAL_COMMUNITY)
Admission: RE | Admit: 2021-01-29 | Discharge: 2021-01-29 | Disposition: A | Discharge status: Home / Self Care | Payer: 59 | Source: Ambulatory Visit | Attending: Family Medicine | Admitting: Family Medicine

## 2021-01-29 ENCOUNTER — Other Ambulatory Visit: Payer: Self-pay | Admitting: Family Medicine

## 2021-01-29 DIAGNOSIS — Z01411 Encounter for gynecological examination (general) (routine) with abnormal findings: Secondary | ICD-10-CM | POA: Diagnosis not present

## 2021-01-31 ENCOUNTER — Other Ambulatory Visit: Payer: Self-pay | Admitting: Urology

## 2021-02-05 LAB — CYTOLOGY - PAP
Comment: NEGATIVE
Diagnosis: NEGATIVE
Diagnosis: REACTIVE
High risk HPV: NEGATIVE

## 2021-02-06 ENCOUNTER — Other Ambulatory Visit: Payer: Self-pay | Admitting: Family Medicine

## 2021-02-06 DIAGNOSIS — R945 Abnormal results of liver function studies: Secondary | ICD-10-CM

## 2021-02-07 NOTE — Progress Notes (Signed)
Covid test on 02/15/21 at 0915am/. Come thru main entrance at [facility] . Have a seat in the lobby to the right as you come thru the door.  Call [phone] and give them your name and let them know you are here for covid testing .                Emma Martin  02/07/2021   Your procedure is scheduled on:             02/19/21   Report to [facility] Main  Entrance   Report to admitting at    [phone]     Call this number if you have problems the morning of surgery [phone]    Remember: Do not eat food , candy gum or mints :After Midnight. You may have clear liquids from midnight until __ 0415am.   Clear liquid diet the day before surgery.    CLEAR LIQUID DIET   Foods Allowed                                                                       Coffee and tea, regular and decaf                              Plain Jell-O any favor except red or purple                                            Fruit ices (not with fruit pulp)                                      Iced Popsicles                                     Carbonated beverages, regular and diet                                    Cranberry, grape and apple juices Sports drinks like Gatorade Lightly seasoned clear broth or consume(fat free) Sugar   _____________________________________________________________________    BRUSH YOUR TEETH MORNING OF SURGERY AND RINSE YOUR MOUTH OUT, NO CHEWING GUM CANDY OR MINTS.     Take these medicines the morning of surgery with A SIP OF WATER:  prozac, flonase, synthroid   DO NOT TAKE ANY DIABETIC MEDICATIONS DAY OF YOUR SURGERY                               You may not have any metal on your body including hair pins and              piercings  Do not wear jewelry, make-up, lotions, powders or perfumes, deodorant             Do not wear  nail polish on your fingernails.  Do not shave  48 hours prior to surgery.              Men may shave face and neck.   Do not bring  valuables to the hospital. [place].  Contacts, dentures or bridgework may not be worn into surgery.  Leave suitcase in the car. After surgery it may be brought to your room.     Patients discharged the day of surgery will not be allowed to drive home. IF YOU ARE HAVING SURGERY AND GOING HOME THE SAME DAY, YOU MUST HAVE AN ADULT TO DRIVE YOU HOME AND BE WITH YOU FOR 24 HOURS. YOU MAY GO HOME BY TAXI OR UBER OR ORTHERWISE, BUT AN ADULT MUST ACCOMPANY YOU HOME AND STAY WITH YOU FOR 24 HOURS.  Name and phone number of your driver:  Special Instructions: N/A              Please read over the following fact sheets you were given: _____________________________________________________________________  [facility] - Preparing for Surgery Before surgery, you can play an important role.  Because skin is not sterile, your skin needs to be as free of germs as possible.  You can reduce the number of germs on your skin by washing with CHG (chlorahexidine gluconate) soap before surgery.  CHG is an antiseptic cleaner which kills germs and bonds with the skin to continue killing germs even after washing. Please DO NOT use if you have an allergy to CHG or antibacterial soaps.  If your skin becomes reddened/irritated stop using the CHG and inform your nurse when you arrive at Short Stay. Do not shave (including legs and underarms) for at least 48 hours prior to the first CHG shower.  You may shave your face/neck. Please follow these instructions carefully:  1.  Shower with CHG Soap the night before surgery and the  morning of Surgery.  2.  If you choose to wash your hair, wash your hair first as usual with your  normal  shampoo.  3.  After you shampoo, rinse your hair and body thoroughly to remove the  shampoo.                           4.  Use CHG as you would any other liquid soap.  You can apply chg directly  to the skin and wash                       Gently  with a scrungie or clean washcloth.  5.  Apply the CHG Soap to your body ONLY FROM THE NECK DOWN.   Do not use on face/ open                           Wound or open sores. Avoid contact with eyes, ears mouth and genitals (private parts).                       Wash face,  Genitals (private parts) with your normal soap.             6.  Wash thoroughly, paying special attention to the area where your surgery  will be performed.  7.  Thoroughly rinse your body with warm water  from the neck down.  8.  DO NOT shower/wash with your normal soap after using and rinsing off  the CHG Soap.                9.  Pat yourself dry with a clean towel.            10.  Wear clean pajamas.            11.  Place clean sheets on your bed the night of your first shower and do not  sleep with pets. Day of Surgery : Do not apply any lotions/deodorants the morning of surgery.  Please wear clean clothes to the hospital/surgery center.  FAILURE TO FOLLOW THESE INSTRUCTIONS MAY RESULT IN THE CANCELLATION OF YOUR SURGERY PATIENT SIGNATURE_________________________________  NURSE SIGNATURE__________________________________  ________________________________________________________________________

## 2021-02-07 NOTE — Progress Notes (Addendum)
Anesthesia Review:  PCP: DR Laverna Peace - Called and requested most recent ov note.  LVMM.   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 Stroke left her with gait issues.   LIves with father    PT with hx of Turner syndrome  Pt can understand if masks are down but otherwise depends on cell phone to communicate and family to communicate.  Does not do sign language.

## 2021-02-11 ENCOUNTER — Encounter (HOSPITAL_COMMUNITY): Payer: Self-pay

## 2021-02-11 ENCOUNTER — Encounter (HOSPITAL_COMMUNITY)
Admission: RE | Admit: 2021-02-11 | Discharge: 2021-02-11 | Disposition: A | Discharge status: Home / Self Care | Payer: 59 | Source: Ambulatory Visit | Attending: Urology | Admitting: Urology

## 2021-02-11 ENCOUNTER — Other Ambulatory Visit: Payer: Self-pay

## 2021-02-11 VITALS — BP 134/96 | HR 90 | Temp 97.8°F | Resp 16 | Ht 61.0 in | Wt 146.0 lb

## 2021-02-11 DIAGNOSIS — Z01818 Encounter for other preprocedural examination: Secondary | ICD-10-CM | POA: Insufficient documentation

## 2021-02-11 HISTORY — DX: Meningitis, unspecified: G03.9

## 2021-02-11 HISTORY — DX: Cerebral infarction, unspecified: I63.9

## 2021-02-11 LAB — BASIC METABOLIC PANEL
Anion gap: 6 (ref 5–15)
BUN: 17 mg/dL (ref 6–20)
CO2: 28 mmol/L (ref 22–32)
Calcium: 9.4 mg/dL (ref 8.9–10.3)
Chloride: 104 mmol/L (ref 98–111)
Creatinine, Ser: 0.42 mg/dL — ABNORMAL LOW (ref 0.44–1.00)
GFR, Estimated: >60 mL/min (ref >60)
Glucose, Bld: 89 mg/dL (ref 70–99)
Potassium: 3.9 mmol/L (ref 3.5–5.1)
Sodium: 138 mmol/L (ref 135–145)

## 2021-02-11 LAB — CBC
HCT: 45.1 % (ref 36.0–46.0)
Hemoglobin: 14.6 g/dL (ref 12.0–15.0)
MCH: 27.7 pg (ref 26.0–34.0)
MCHC: 32.4 g/dL (ref 30.0–36.0)
MCV: 85.6 fL (ref 80.0–100.0)
Platelets: 236 10*3/uL (ref 150–400)
RBC: 5.27 MIL/uL — ABNORMAL HIGH (ref 3.87–5.11)
RDW: 14.2 % (ref 11.5–15.5)
WBC: 8.5 10*3/uL (ref 4.0–10.5)
nRBC: 0 % (ref 0.0–0.2)

## 2021-02-15 ENCOUNTER — Other Ambulatory Visit: Payer: Self-pay

## 2021-02-15 ENCOUNTER — Encounter (HOSPITAL_COMMUNITY)
Admission: RE | Admit: 2021-02-15 | Discharge: 2021-02-15 | Disposition: A | Discharge status: Home / Self Care | Payer: 59 | Source: Ambulatory Visit | Attending: Urology | Admitting: Urology

## 2021-02-15 DIAGNOSIS — Z01818 Encounter for other preprocedural examination: Secondary | ICD-10-CM

## 2021-02-15 DIAGNOSIS — Z01812 Encounter for preprocedural laboratory examination: Secondary | ICD-10-CM | POA: Insufficient documentation

## 2021-02-15 DIAGNOSIS — Z01810 Encounter for preprocedural cardiovascular examination: Secondary | ICD-10-CM | POA: Diagnosis present

## 2021-02-15 DIAGNOSIS — Z20822 Contact with and (suspected) exposure to covid-19: Secondary | ICD-10-CM | POA: Insufficient documentation

## 2021-02-15 LAB — SARS CORONAVIRUS 2 (TAT 6-24 HRS): SARS Coronavirus 2: NEGATIVE

## 2021-02-18 NOTE — Anesthesia Preprocedure Evaluation (Addendum)
Anesthesia Evaluation  Patient identified by MRN, date of birth, ID band Patient awake    Reviewed: Allergy & Precautions, H&P , NPO status , Patient's Chart, lab work & pertinent test results  History of Anesthesia Complications (+) PONV  Airway Mallampati: II  TM Distance: >3 FB Neck ROM: Full    Dental no notable dental hx. (+) Teeth Intact, Dental Advisory Given   Pulmonary neg pulmonary ROS,    Pulmonary exam normal breath sounds clear to auscultation       Cardiovascular Exercise Tolerance: Good hypertension, Pt. on medications  Rhythm:Regular Rate:Normal     Neuro/Psych Anxiety CVA    GI/Hepatic negative GI ROS, Neg liver ROS,   Endo/Other  Hypothyroidism   Renal/GU negative Renal ROS  negative genitourinary   Musculoskeletal   Abdominal   Peds  Hematology negative hematology ROS (+)   Anesthesia Other Findings   Reproductive/Obstetrics negative OB ROS                            Anesthesia Physical Anesthesia Plan  ASA: 2  Anesthesia Plan: General   Post-op Pain Management: Tylenol PO (pre-op)   Induction: Intravenous  PONV Risk Score and Plan: 4 or greater and Ondansetron, Dexamethasone and Midazolam  Airway Management Planned: Oral ETT  Additional Equipment:   Intra-op Plan:   Post-operative Plan: Extubation in OR  Informed Consent: I have reviewed the patients History and Physical, chart, labs and discussed the procedure including the risks, benefits and alternatives for the proposed anesthesia with the patient or authorized representative who has indicated his/her understanding and acceptance.     Dental advisory given  Plan Discussed with: CRNA  Anesthesia Plan Comments:        Anesthesia Quick Evaluation

## 2021-02-19 ENCOUNTER — Ambulatory Visit (HOSPITAL_COMMUNITY): Payer: 59 | Admitting: Physician Assistant

## 2021-02-19 ENCOUNTER — Encounter (HOSPITAL_COMMUNITY)
Admission: RE | Disposition: A | Discharge status: Home / Self Care | Payer: Self-pay | Source: Ambulatory Visit | Attending: Urology

## 2021-02-19 ENCOUNTER — Encounter (HOSPITAL_COMMUNITY): Payer: Self-pay | Admitting: Urology

## 2021-02-19 ENCOUNTER — Other Ambulatory Visit: Payer: Self-pay

## 2021-02-19 ENCOUNTER — Observation Stay (HOSPITAL_COMMUNITY)
Admission: RE | Admit: 2021-02-19 | Discharge: 2021-02-20 | Disposition: A | Discharge status: Home / Self Care | Payer: 59 | Source: Ambulatory Visit | Attending: Urology | Admitting: Urology

## 2021-02-19 ENCOUNTER — Ambulatory Visit (HOSPITAL_COMMUNITY): Payer: 59 | Admitting: Certified Registered"

## 2021-02-19 DIAGNOSIS — C642 Malignant neoplasm of left kidney, except renal pelvis: Secondary | ICD-10-CM | POA: Diagnosis not present

## 2021-02-19 DIAGNOSIS — N2889 Other specified disorders of kidney and ureter: Secondary | ICD-10-CM | POA: Diagnosis present

## 2021-02-19 DIAGNOSIS — Z79899 Other long term (current) drug therapy: Secondary | ICD-10-CM | POA: Insufficient documentation

## 2021-02-19 DIAGNOSIS — I10 Essential (primary) hypertension: Secondary | ICD-10-CM | POA: Insufficient documentation

## 2021-02-19 HISTORY — PX: ROBOTIC ASSITED PARTIAL NEPHRECTOMY: SHX6087

## 2021-02-19 LAB — BASIC METABOLIC PANEL
Anion gap: 10 (ref 5–15)
BUN: 11 mg/dL (ref 6–20)
CO2: 24 mmol/L (ref 22–32)
Calcium: 8.2 mg/dL — ABNORMAL LOW (ref 8.9–10.3)
Chloride: 101 mmol/L (ref 98–111)
Creatinine, Ser: 0.58 mg/dL (ref 0.44–1.00)
GFR, Estimated: >60 mL/min (ref >60)
Glucose, Bld: 166 mg/dL — ABNORMAL HIGH (ref 70–99)
Potassium: 3.7 mmol/L (ref 3.5–5.1)
Sodium: 135 mmol/L (ref 135–145)

## 2021-02-19 LAB — CBC
HCT: 40.9 % (ref 36.0–46.0)
Hemoglobin: 13.3 g/dL (ref 12.0–15.0)
MCH: 27.8 pg (ref 26.0–34.0)
MCHC: 32.5 g/dL (ref 30.0–36.0)
MCV: 85.6 fL (ref 80.0–100.0)
Platelets: 189 10*3/uL (ref 150–400)
RBC: 4.78 MIL/uL (ref 3.87–5.11)
RDW: 13.8 % (ref 11.5–15.5)
WBC: 12.2 10*3/uL — ABNORMAL HIGH (ref 4.0–10.5)
nRBC: 0 % (ref 0.0–0.2)

## 2021-02-19 LAB — TYPE AND SCREEN
ABO/RH(D): A NEG
Antibody Screen: NEGATIVE

## 2021-02-19 LAB — HCG, SERUM, QUALITATIVE: Preg, Serum: NEGATIVE

## 2021-02-19 SURGERY — NEPHRECTOMY, PARTIAL, ROBOT-ASSISTED
Anesthesia: General | Laterality: Left

## 2021-02-19 MED ORDER — ONDANSETRON HCL 4 MG/2ML IJ SOLN
INTRAMUSCULAR | Status: AC
  Filled 2021-02-19: qty 2

## 2021-02-19 MED ORDER — SUGAMMADEX SODIUM 200 MG/2ML IV SOLN
INTRAVENOUS | Status: DC | PRN
  Administered 2021-02-19: 140 mg via INTRAVENOUS

## 2021-02-19 MED ORDER — BUPIVACAINE LIPOSOME 1.3 % IJ SUSP
INTRAMUSCULAR | Status: DC | PRN
  Administered 2021-02-19: 20 mL

## 2021-02-19 MED ORDER — LEVOTHYROXINE SODIUM 75 MCG PO TABS
75.0000 ug | ORAL_TABLET | Freq: Every day | ORAL | Status: DC
  Administered 2021-02-20: 75 ug via ORAL
  Filled 2021-02-19: qty 1

## 2021-02-19 MED ORDER — ATORVASTATIN CALCIUM 20 MG PO TABS
20.0000 mg | ORAL_TABLET | Freq: Every day | ORAL | Status: DC
  Administered 2021-02-19: 20 mg via ORAL
  Filled 2021-02-19: qty 1

## 2021-02-19 MED ORDER — FENTANYL CITRATE (PF) 250 MCG/5ML IJ SOLN
INTRAMUSCULAR | Status: DC | PRN
Start: 2021-02-19 — End: 2021-02-19
  Administered 2021-02-19: 25 ug via INTRAVENOUS
  Administered 2021-02-19: 100 ug via INTRAVENOUS

## 2021-02-19 MED ORDER — DOCUSATE SODIUM 100 MG PO CAPS: 100.0000 mg | ORAL_CAPSULE | Freq: Two times a day (BID) | ORAL | Status: AC

## 2021-02-19 MED ORDER — HYDROMORPHONE HCL 1 MG/ML IJ SOLN: 0.2500 mg | INTRAMUSCULAR | Status: DC | PRN

## 2021-02-19 MED ORDER — BUPIVACAINE LIPOSOME 1.3 % IJ SUSP
INTRAMUSCULAR | Status: AC
  Filled 2021-02-19: qty 20

## 2021-02-19 MED ORDER — SODIUM CHLORIDE (PF) 0.9 % IJ SOLN
INTRAMUSCULAR | Status: DC | PRN
  Administered 2021-02-19: 20 mL

## 2021-02-19 MED ORDER — PHENYLEPHRINE HCL-NACL 20-0.9 MG/250ML-% IV SOLN
INTRAVENOUS | Status: DC | PRN
  Administered 2021-02-19: 20 ug/min via INTRAVENOUS

## 2021-02-19 MED ORDER — HYDROCODONE-ACETAMINOPHEN 5-325 MG PO TABS: 1.0000 | ORAL_TABLET | Freq: Four times a day (QID) | ORAL | 0 refills | Status: AC | PRN

## 2021-02-19 MED ORDER — SODIUM CHLORIDE 0.9 % IV SOLN: INTRAVENOUS | Status: DC

## 2021-02-19 MED ORDER — ROCURONIUM BROMIDE 10 MG/ML (PF) SYRINGE
PREFILLED_SYRINGE | INTRAVENOUS | Status: AC
  Filled 2021-02-19: qty 10

## 2021-02-19 MED ORDER — MIDAZOLAM HCL 2 MG/2ML IJ SOLN
INTRAMUSCULAR | Status: DC | PRN
  Administered 2021-02-19: 1 mg via INTRAVENOUS

## 2021-02-19 MED ORDER — FENTANYL CITRATE (PF) 100 MCG/2ML IJ SOLN
INTRAMUSCULAR | Status: AC
  Filled 2021-02-19: qty 2

## 2021-02-19 MED ORDER — PHENYLEPHRINE HCL-NACL 20-0.9 MG/250ML-% IV SOLN: INTRAVENOUS | Status: DC | PRN

## 2021-02-19 MED ORDER — CEFAZOLIN SODIUM-DEXTROSE 1-4 GM/50ML-% IV SOLN
1.0000 g | Freq: Three times a day (TID) | INTRAVENOUS | Status: AC
  Administered 2021-02-19 (×2): 1 g via INTRAVENOUS
  Filled 2021-02-19 (×2): qty 50

## 2021-02-19 MED ORDER — LACTATED RINGERS IR SOLN
Status: DC | PRN
  Administered 2021-02-19: 1

## 2021-02-19 MED ORDER — CEFAZOLIN SODIUM-DEXTROSE 2-4 GM/100ML-% IV SOLN
2.0000 g | Freq: Once | INTRAVENOUS | Status: AC
  Administered 2021-02-19: 2 g via INTRAVENOUS
  Filled 2021-02-19: qty 100

## 2021-02-19 MED ORDER — MORPHINE SULFATE (PF) 2 MG/ML IV SOLN: 2.0000 mg | INTRAVENOUS | Status: DC | PRN

## 2021-02-19 MED ORDER — SODIUM CHLORIDE 0.9% FLUSH
3.0000 mL | Freq: Two times a day (BID) | INTRAVENOUS | Status: DC
  Administered 2021-02-20: 3 mL via INTRAVENOUS

## 2021-02-19 MED ORDER — DOCUSATE SODIUM 100 MG PO CAPS
100.0000 mg | ORAL_CAPSULE | Freq: Two times a day (BID) | ORAL | Status: DC
  Administered 2021-02-19 – 2021-02-20 (×2): 100 mg via ORAL
  Filled 2021-02-19 (×2): qty 1

## 2021-02-19 MED ORDER — ROCURONIUM BROMIDE 10 MG/ML (PF) SYRINGE
PREFILLED_SYRINGE | INTRAVENOUS | Status: DC | PRN
  Administered 2021-02-19: 10 mg via INTRAVENOUS
  Administered 2021-02-19: 20 mg via INTRAVENOUS
  Administered 2021-02-19: 60 mg via INTRAVENOUS

## 2021-02-19 MED ORDER — PROPOFOL 10 MG/ML IV BOLUS
INTRAVENOUS | Status: AC
  Filled 2021-02-19: qty 20

## 2021-02-19 MED ORDER — PROPOFOL 10 MG/ML IV BOLUS
INTRAVENOUS | Status: DC | PRN
  Administered 2021-02-19: 100 mg via INTRAVENOUS

## 2021-02-19 MED ORDER — SODIUM CHLORIDE (PF) 0.9 % IJ SOLN
INTRAMUSCULAR | Status: AC
  Filled 2021-02-19: qty 20

## 2021-02-19 MED ORDER — FLUOXETINE HCL 20 MG PO CAPS
40.0000 mg | ORAL_CAPSULE | Freq: Every day | ORAL | Status: DC
  Filled 2021-02-19: qty 2

## 2021-02-19 MED ORDER — ACETAMINOPHEN 500 MG PO TABS
1000.0000 mg | ORAL_TABLET | Freq: Once | ORAL | Status: AC
  Administered 2021-02-19: 1000 mg via ORAL
  Filled 2021-02-19: qty 2

## 2021-02-19 MED ORDER — LIDOCAINE 2% (20 MG/ML) 5 ML SYRINGE
INTRAMUSCULAR | Status: DC | PRN
  Administered 2021-02-19: 60 mg via INTRAVENOUS

## 2021-02-19 MED ORDER — PROMETHAZINE HCL 12.5 MG PO TABS: 12.5000 mg | ORAL_TABLET | ORAL | 0 refills | Status: AC | PRN

## 2021-02-19 MED ORDER — DEXAMETHASONE SODIUM PHOSPHATE 10 MG/ML IJ SOLN
INTRAMUSCULAR | Status: AC
  Filled 2021-02-19: qty 1

## 2021-02-19 MED ORDER — HYDROMORPHONE HCL 1 MG/ML IJ SOLN
INTRAMUSCULAR | Status: AC
  Administered 2021-02-19: 0.25 mg via INTRAVENOUS
  Filled 2021-02-19: qty 1

## 2021-02-19 MED ORDER — TRAMADOL HCL 50 MG PO TABS
100.0000 mg | ORAL_TABLET | Freq: Four times a day (QID) | ORAL | Status: DC | PRN
  Administered 2021-02-19: 100 mg via ORAL
  Filled 2021-02-19: qty 2

## 2021-02-19 MED ORDER — ONDANSETRON HCL 4 MG/2ML IJ SOLN
4.0000 mg | INTRAMUSCULAR | Status: DC | PRN
  Administered 2021-02-19: 4 mg via INTRAVENOUS
  Filled 2021-02-19: qty 2

## 2021-02-19 MED ORDER — TRAMADOL HCL 50 MG PO TABS: 50.0000 mg | ORAL_TABLET | Freq: Four times a day (QID) | ORAL | Status: DC | PRN

## 2021-02-19 MED ORDER — FLUTICASONE PROPIONATE 50 MCG/ACT NA SUSP
2.0000 | Freq: Every day | NASAL | Status: DC
  Filled 2021-02-19: qty 16

## 2021-02-19 MED ORDER — ONDANSETRON HCL 4 MG/2ML IJ SOLN
INTRAMUSCULAR | Status: DC | PRN
  Administered 2021-02-19: 4 mg via INTRAVENOUS

## 2021-02-19 MED ORDER — SODIUM CHLORIDE 0.9% FLUSH: 3.0000 mL | INTRAVENOUS | Status: DC | PRN

## 2021-02-19 MED ORDER — MIDAZOLAM HCL 2 MG/2ML IJ SOLN
INTRAMUSCULAR | Status: AC
  Filled 2021-02-19: qty 2

## 2021-02-19 MED ORDER — EPHEDRINE SULFATE-NACL 50-0.9 MG/10ML-% IV SOSY
PREFILLED_SYRINGE | INTRAVENOUS | Status: DC | PRN
Start: 2021-02-19 — End: 2021-02-19
  Administered 2021-02-19 (×2): 5 mg via INTRAVENOUS

## 2021-02-19 MED ORDER — ACETAMINOPHEN 10 MG/ML IV SOLN
1000.0000 mg | Freq: Four times a day (QID) | INTRAVENOUS | Status: DC
  Administered 2021-02-19 – 2021-02-20 (×3): 1000 mg via INTRAVENOUS
  Filled 2021-02-19 (×3): qty 100

## 2021-02-19 MED ORDER — STERILE WATER FOR IRRIGATION IR SOLN
Status: DC | PRN
Start: 2021-02-19 — End: 2021-02-19
  Administered 2021-02-19: 1000 mL

## 2021-02-19 MED ORDER — LACTATED RINGERS IV SOLN: INTRAVENOUS | Status: DC

## 2021-02-19 MED ORDER — "VISTASEAL 4 ML SINGLE DOSE KIT "
4.0000 mL | PACK | Freq: Once | CUTANEOUS | Status: AC
  Administered 2021-02-19: 4 mL via TOPICAL
  Filled 2021-02-19: qty 4

## 2021-02-19 MED ORDER — DEXAMETHASONE SODIUM PHOSPHATE 10 MG/ML IJ SOLN
INTRAMUSCULAR | Status: DC | PRN
  Administered 2021-02-19: 4 mg via INTRAVENOUS

## 2021-02-19 MED ORDER — PHENYLEPHRINE 40 MCG/ML (10ML) SYRINGE FOR IV PUSH (FOR BLOOD PRESSURE SUPPORT)
PREFILLED_SYRINGE | INTRAVENOUS | Status: DC | PRN
  Administered 2021-02-19: 40 ug via INTRAVENOUS
  Administered 2021-02-19: 60 ug via INTRAVENOUS
  Administered 2021-02-19: 40 ug via INTRAVENOUS

## 2021-02-19 MED ORDER — SODIUM CHLORIDE 0.9 % IV SOLN: 250.0000 mL | INTRAVENOUS | Status: DC | PRN

## 2021-02-19 SURGICAL SUPPLY — 73 items
APPLICATOR VISTASEAL 35 (MISCELLANEOUS) ×2 IMPLANT
BAG COUNTER SPONGE SURGICOUNT (BAG) IMPLANT
CHLORAPREP W/TINT 26 (MISCELLANEOUS) ×2 IMPLANT
CLIP LIGATING HEM O LOK PURPLE (MISCELLANEOUS) ×2 IMPLANT
CLIP LIGATING HEMO LOK XL GOLD (MISCELLANEOUS) IMPLANT
CLIP LIGATING HEMO O LOK GREEN (MISCELLANEOUS) ×4 IMPLANT
CLIP SUT LAPRA TY ABSORB (SUTURE) ×4 IMPLANT
COVER SURGICAL LIGHT HANDLE (MISCELLANEOUS) ×2 IMPLANT
COVER TIP SHEARS 8 DVNC (MISCELLANEOUS) ×1 IMPLANT
COVER TIP SHEARS 8MM DA VINCI (MISCELLANEOUS) ×1
CUTTER ECHEON FLEX ENDO 45 340 (ENDOMECHANICALS) IMPLANT
DECANTER SPIKE VIAL GLASS SM (MISCELLANEOUS) ×2 IMPLANT
DERMABOND ADVANCED (GAUZE/BANDAGES/DRESSINGS) ×1
DERMABOND ADVANCED .7 DNX12 (GAUZE/BANDAGES/DRESSINGS) ×1 IMPLANT
DRAIN CHANNEL 15F RND FF 3/16 (WOUND CARE) IMPLANT
DRAPE ARM DVNC X/XI (DISPOSABLE) ×4 IMPLANT
DRAPE COLUMN DVNC XI (DISPOSABLE) ×1 IMPLANT
DRAPE DA VINCI XI ARM (DISPOSABLE) ×4
DRAPE DA VINCI XI COLUMN (DISPOSABLE) ×2
DRAPE INCISE IOBAN 66X45 STRL (DRAPES) ×2 IMPLANT
DRAPE SHEET LG 3/4 BI-LAMINATE (DRAPES) ×2 IMPLANT
ELECT PENCIL ROCKER SW 15FT (MISCELLANEOUS) ×2 IMPLANT
ELECT REM PT RETURN 15FT ADLT (MISCELLANEOUS) ×2 IMPLANT
EVACUATOR SILICONE 100CC (DRAIN) IMPLANT
GAUZE 4X4 16PLY ~~LOC~~+RFID DBL (SPONGE) ×2 IMPLANT
GLOVE SRG 8 PF TXTR STRL LF DI (GLOVE) ×1 IMPLANT
GLOVE SURG ENC MOIS LTX SZ6.5 (GLOVE) ×2 IMPLANT
GLOVE SURG ENC TEXT LTX SZ7.5 (GLOVE) ×4 IMPLANT
GLOVE SURG UNDER POLY LF SZ8 (GLOVE) ×2
GOWN STRL REUS W/TWL LRG LVL3 (GOWN DISPOSABLE) ×2 IMPLANT
GOWN STRL REUS W/TWL XL LVL3 (GOWN DISPOSABLE) ×4 IMPLANT
HEMOSTAT SURGICEL 4X8 (HEMOSTASIS) ×2 IMPLANT
HOLDER FOLEY CATH W/STRAP (MISCELLANEOUS) ×2 IMPLANT
IRRIG SUCT STRYKERFLOW 2 WTIP (MISCELLANEOUS) ×2
IRRIGATION SUCT STRKRFLW 2 WTP (MISCELLANEOUS) ×1 IMPLANT
KIT BASIN OR (CUSTOM PROCEDURE TRAY) ×2 IMPLANT
KIT TURNOVER KIT A (KITS) IMPLANT
LOOP VESSEL MAXI BLUE (MISCELLANEOUS) IMPLANT
MARKER SKIN DUAL TIP RULER LAB (MISCELLANEOUS) ×2 IMPLANT
NDL INSUFFLATION 14GA 120MM (NEEDLE) ×1 IMPLANT
NEEDLE INSUFFLATION 14GA 120MM (NEEDLE) ×2 IMPLANT
POUCH SPECIMEN RETRIEVAL 10MM (ENDOMECHANICALS) ×2 IMPLANT
PROTECTOR NERVE ULNAR (MISCELLANEOUS) ×4 IMPLANT
RELOAD STAPLE 45 2.6 WHT THIN (STAPLE) IMPLANT
SCISSORS LAP 5X45 EPIX DISP (ENDOMECHANICALS) ×2 IMPLANT
SEAL CANN UNIV 5-8 DVNC XI (MISCELLANEOUS) ×4 IMPLANT
SEAL XI 5MM-8MM UNIVERSAL (MISCELLANEOUS) ×8
SEALANT SURGICAL APPL DUAL CAN (MISCELLANEOUS) IMPLANT
SET TUBE SMOKE EVAC HIGH FLOW (TUBING) ×2 IMPLANT
SOLUTION ELECTROLUBE (MISCELLANEOUS) ×2 IMPLANT
STAPLE RELOAD 45 WHT (STAPLE) IMPLANT
STAPLE RELOAD 45MM WHITE (STAPLE)
SUT ETHILON 2 0 PSLX (SUTURE) IMPLANT
SUT MNCRL AB 4-0 PS2 18 (SUTURE) ×4 IMPLANT
SUT PDS AB 0 CT1 36 (SUTURE) IMPLANT
SUT V-LOC BARB 180 2/0GR6 GS22 (SUTURE)
SUT VIC AB 1 CT1 36 (SUTURE) ×8 IMPLANT
SUT VIC AB 2-0 SH 27 (SUTURE) ×2
SUT VIC AB 2-0 SH 27X BRD (SUTURE) IMPLANT
SUT VIC AB 4-0 RB1 27 (SUTURE) ×4
SUT VIC AB 4-0 RB1 27XBRD (SUTURE) IMPLANT
SUT VICRYL 0 UR6 27IN ABS (SUTURE) IMPLANT
SUT VLOC BARB 180 ABS3/0GR12 (SUTURE) ×4
SUTURE V-LC BRB 180 2/0GR6GS22 (SUTURE) IMPLANT
SUTURE VLOC BRB 180 ABS3/0GR12 (SUTURE) ×2 IMPLANT
TOWEL OR 17X26 10 PK STRL BLUE (TOWEL DISPOSABLE) ×2 IMPLANT
TOWEL OR NON WOVEN STRL DISP B (DISPOSABLE) ×2 IMPLANT
TRAY FOLEY MTR SLVR 16FR STAT (SET/KITS/TRAYS/PACK) ×2 IMPLANT
TRAY LAPAROSCOPIC (CUSTOM PROCEDURE TRAY) ×2 IMPLANT
TROCAR BLADELESS OPT 5 100 (ENDOMECHANICALS) IMPLANT
TROCAR UNIVERSAL OPT 12M 100M (ENDOMECHANICALS) IMPLANT
TROCAR XCEL 12X100 BLDLESS (ENDOMECHANICALS) ×2 IMPLANT
WATER STERILE IRR 1000ML POUR (IV SOLUTION) ×2 IMPLANT

## 2021-02-19 NOTE — Op Note (Signed)
Operative Note  Preoperative diagnosis:  1.  2.4 cm left renal mass  Postoperative diagnosis: 1.  2.4 cm left renal mass  Procedure(s): 1.  Robot-assisted laparoscopic left partial nephrectomy 2.  Intraoperative ultrasound of single retroperitoneal organ  Surgeon: Ellison Hughs, MD  Assistants: 1.  Debbrah Alar, PA-C An assistant was required for this surgical procedure.  The duties of the assistant included but were not limited to suctioning, passing suture, camera manipulation, retraction.  This procedure would not be able to be performed without an Environmental consultant.  2.  Bishop Limbo, MD PGY4  Anesthesia:  General  Complications:  None  EBL: 50 mL  Specimens: 1.  Left renal mass  Drains/Catheters: 1.  Foley catheter  Intraoperative findings:   Exophytic 2.4 cm left lower pole renal mass Warm ischemia time 26 minutes Renorrhaphy was hemastatic at the conclusion of the case   Indication:  Emma Martin is a 41 y.o. female with a solid enhancing 2.4 cm left renal mass with features concerning for renal cell carcinoma.  She has been consented for the above procedures, voices understanding and wishes to proceed.  Description of procedure:  After informed consent was obtained, the patient was brought to the operating room and general endotracheal anesthesia was administered.  A 16 French Foley catheter was then sterilely placed and set to gravity drainage.  The patient was then placed in the right lateral decubitus position and prepped and draped in usual sterile fashion.  A timeout was performed.  An 8 mm incision was then made lateral to the left rectus muscle at the level of the left 12th rib.  Abdominal access was obtained via a Veress needle.  The abdominal cavity was then insufflated up to 15 mmHg.  An 8 mm port was then introduced into the abdominal cavity.  Inspection of the port entry site by the robotic camera revealed no adjacent organ injury.  We then placed 3  additional 8 mm robotic ports to triangulate the left renal hilum.  A 12 mm assistant port was then placed between the carmera port and 3rd robotic arm.  The white line of Toldt along the descending colon was incised sharply and the colon, along with its mesocolonic fat, was reflected medially until the aorta was identified.  We then made a small window adjacent to the lower pole of the left kidney, identifying the left psoas muscle, left ureter and left gonadal vein.  The left ureter and gonadal vein were then reflected anteriorly allowing [place] to then incised the perihilar attachments using electrocautery.  We encountered a small lumbar vein adjacent to the insertion of the left gonadal vein into the left renal vein.  This lumbar vein was ligated with hemo-lock clips in 2 places and incised sharply.  This provided [place] excellent exposure to the left renal hilum.  The perilymphatic tissue surrounding the left renal artery was carefully dissected away, creating a window to place a bulldog clamp later in the procedure.  The anterior portion of Gerota's fascia was incised, allowing reflection the perinephric fat medially and laterally until there was adequate exposure of the lower pole left renal mass.  Intraoperative ultrasound confirmed the heterogenous echogenicity of the lesion compared to the remainder of the renal parenchyma and allowed identification of the depth/borders of the mass, which were demarcated using electrocautery along the renal capsule.  We then exposed the left renal artery and placed a bulldog clamp, marking warm ischemia time.  The left kidney immediately became ischemic and  pale in appearance.  The left renal mass was then sharply excised with minimal blood loss.  After the mass was free, it was placed in the left upper quadrant to be retrieved later on during the operation.  There were two, 1 cm areas where the collecting system was entered that were reapproximated with running 4-0 Vicryl  suture.  The renorrhaphy was then performed using a 3-0 V-lock in the deep layer of the renal parenchyma.  The bulldog clamp was then removed marking warm ischemia time at 26 minutes.   A series of 1-0 Vicryl sutures with Hem-o-lok clips acting as a buttress were then used to reapproximate the renal capsule.  There did not appear to be any obvious bleeding around the renal hilum nor surrounding our repair.  The incised Gerota's fascia overlying the mass was then reapproximated using a running 2-0 V lock suture.  The mass was then placed in an Endo Catch bag.  Surgicel and Vistaseal were then applied to the resection bed.  The robot was then de-docked and the camera was then reinserted into the assistant port. Laparoscopic graspers were then used to grab the string of the Endo Catch bag, which was brought out through the 12 mm assistant port.  The abdomen was then desufflated and all ports were removed.  The assistant port incision was then extended approximately 1-2 cm and the left renal mass, within the Endo Catch bag, was removed and sent to pathology for permanent section.  The fascia within the assistant port incision was then reapproximated using a 0 Vicryl suture.  The remainder of the incisions were then closed using 4-0 Monocryl and dressed appropriately.  Patient tolerated the procedure well and was transferred to the postanesthesia unit in stable condition.  Plan: Monitor on the floor with bedrest overnight

## 2021-02-19 NOTE — Transfer of Care (Signed)
Immediate Anesthesia Transfer of Care Note  Patient: Emma Martin  Procedure(s) Performed: XI ROBOTIC ASSITED PARTIAL NEPHRECTOMY (Left)  Patient Location: PACU  Anesthesia Type:General  Level of Consciousness: awake, drowsy and patient cooperative  Airway & Oxygen Therapy: Patient Spontanous Breathing and Patient connected to face mask oxygen  Post-op Assessment: Report given to RN and Post -op Vital signs reviewed and stable  Post vital signs: Reviewed and stable  Last Vitals:  Vitals Value Taken Time  BP 138/90 02/19/21 1036  Temp    Pulse 103 02/19/21 1039  Resp 26 02/19/21 1039  SpO2 100 % 02/19/21 1039  Vitals shown include unvalidated device data.  Last Pain:  Vitals:   02/19/21 0619  TempSrc:   PainSc: 0-No pain      Patients Stated Pain Goal: 4 (78/71/83 6725)  Complications: No notable events documented.

## 2021-02-19 NOTE — H&P (Signed)
PRE-OP H&P  Office Visit Report     02/05/2021   --------------------------------------------------------------------------------   Emma Martin  MRN: [id number]  DOB: 03-Dec-1980, 41 year old Female  SSN:    PRIMARY CARE:  Maurice Small, MD  REFERRING:  Harrell Gave A. Lovena Neighbours, MD  PROVIDER:  Ellison Hughs, M.D.  TREATING:  Daine Gravel, NP  LOCATION:  [facility] [phone]     --------------------------------------------------------------------------------   CC/HPI: Left renal Mass   HPI: Ms. Emma Martin is a 41 year old female with a history of Turner syndrome, CVA, hearing impairment, meningitis and a solid enhancing left lower pole renal mass.   04/22/2018: She was recently hospitalized with meningitis. She has lost her hearing as a result. She is accompanied by her father. He also states that she had a stroke as a result of this. She is not on blood thinners. During hospitalization, she underwent a CT of the abdomen and pelvis on 01/15/2018 with contrast due to a fall with acute generalized abdominal pain. This revealed a heterogeneous enhancing mass in the lower pole of the left kidney measuring 2.1 x 2.7 cm that was mostly endophytic. She underwent a follow-up ultrasound on 03/26/2018 that was not helpful with further characterization. She denies family history of genitourinary malignancy. Denies hematuria or dysuria. She denies history of abdominal surgeries.   04/29/18: The patient is accompanied by her father at today's visit. CT abdomen/pelvis with and without contrast from 04/27/2018 shows interval decrease in size of the left lower pole lesion that was seen on CT from December 2019. She denies left-sided flank pain, interval urinary tract infections, dysuria or hematuria. She has no personal/family history of GU malignancies. The patient nor her father can recollect if she had been treated for pyelonephritis during her hospitalization in December. UA today does show  signs of cystitis though she states that she is asymptomatic from a urinary standpoint.   11/04/18: Ms. Emma Martin is here today for a routine follow-up and to discuss her recent CT results, which showed a solid and enhancing left lower pole renal mass that is stable in size at 1.9 cm in greatest dimension. She denies interval episodes of flank pain or hematuria. She accompanied by her father at today's visit.   06/06/19: The patient is here today for routine follow-up. She had an MRI on 06/01/2019 that showed a stable indeterminate left lower pole renal mass measuring approximately 1.8 x 1.7 cm. The mass is noted to have some enhancement which makes it concerning for a small renal cell carcinoma. No evidence of metastatic disease was noted on her MRI. Today, she denies flank pain, dysuria or hematuria. She is accompanied by her father during today's visit.   12/08/19: The patient is here today for a routine follow-up. MRI from 12/06/19 showed a stable indeterminate left renal lesion. She denies flank pain, dysuria or hematuria. She is accompanied by her father during today's visit.   12/27/20: The patient is here today for a routine follow-up. She is accompanied by her father today. Recent CT revealed that her left renal mass is slowly enlarged and now measuring 2.4 cm with features concerning for RCC. She has done well over the past year and denies any new health issues. She denies interval episodes of flank pain, hematuria or UTIs.   02/05/21: Here for preoperative appointment prior to undergoing a left partial nephrectomy due to a left renal mass. Currently denies any new health issues or concerns. He has had no complaints of pain, recent infections or  hematuria. He denies any shortness of breath or chest pain.     ALLERGIES: Benzoin TINC    MEDICATIONS: Levothyroxine Sodium 75 mcg tablet  Lisinopril 10 mg tablet  Acetaminophen 325 mg tablet  Atorvastatin Calcium 20 mg tablet  Flonase Allergy  Relief  Junel  Magnesium Oxide 400  Multivitamin  Ondansetron Hcl 4 mg tablet  Potassium Chloride  Senna     GU PSH: Locm 300-399Mg /Ml Iodine,1Ml - 12/04/2020, 11/01/2018, 2020       PSH Notes: History of reconstructive neck surgery for webbed neck, history of jaw surgery, history of scoliosis/back surgery stabilization   NON-GU PSH: Anesth, Ear Surgery     GU PMH: Left renal neoplasm - 12/27/2020, (Stable), - 19/50/9326 Left uncertain neoplasm of kidney, Left - 12/04/2020, Left, - 12/08/2019 (Improving), Left, - 2020, - 2020      PMH Notes: scoliosis     NON-GU PMH: Bacteriuria - 2020 Pyuria/other UA findings - 2020 Anxiety Hypertension Turner's syndrome, unspecified    FAMILY HISTORY: ALS-parkinsonism/Dementia Complex 1 - Mother   SOCIAL HISTORY: Marital Status: Single Current Smoking Status: Patient has never smoked.   Tobacco Use Assessment Completed: Used Tobacco in last 30 days? Drinks 1 caffeinated drink per day.    REVIEW OF SYSTEMS:    GU Review Female:   Patient denies frequent urination, hard to postpone urination, burning /pain with urination, get up at night to urinate, leakage of urine, stream starts and stops, trouble starting your stream, have to strain to urinate, and being pregnant.  Gastrointestinal (Upper):   Patient denies nausea, vomiting, and indigestion/ heartburn.  Gastrointestinal (Lower):   Patient denies diarrhea and constipation.  Constitutional:   Patient denies fever, night sweats, weight loss, and fatigue.  Musculoskeletal:   Patient denies back pain and joint pain.  Neurological:   Patient denies headaches and dizziness.  Psychologic:   Patient denies depression and anxiety.   VITAL SIGNS:      02/05/2021 02:01 PM  BP 131/88 mmHg  Pulse 106 /min  Temperature 98.1 F / 36.7 C   MULTI-SYSTEM PHYSICAL EXAMINATION:    Constitutional: Well-nourished. No physical deformities. Normally developed. Good grooming.  Neck: Neck  symmetrical, not swollen. Normal tracheal position.  Respiratory: Normal breath sounds. No labored breathing, no use of accessory muscles.   Cardiovascular: Regular rate and rhythm. No murmur, no gallop. Normal temperature, normal extremity pulses, no swelling, no varicosities.   Lymphatic: No enlargement of neck, axillae, groin.  Skin: No paleness, no jaundice, no cyanosis. No lesion, no ulcer, no rash.  Neurologic / Psychiatric: Oriented to time, oriented to place, oriented to person. No depression, no anxiety, no agitation.  Gastrointestinal: No mass, no tenderness, no rigidity, non obese abdomen.  Eyes: Normal conjunctivae. Normal eyelids.  Ears, Nose, Mouth, and Throat: Deaf. Left ear no scars, no lesions, no masses. Right ear no scars, no lesions, no masses. Nose no scars, no lesions, no masses. Normal lips.   Musculoskeletal: Normal gait and station of head and neck.     Complexity of Data: CLINICAL DATA: Re-evaluation of known left renal mass   EXAM:  CT ABDOMEN WITHOUT AND WITH CONTRAST   TECHNIQUE:  Multidetector CT imaging of the abdomen was performed following the  standard protocol before and following the bolus administration of  intravenous contrast.   CONTRAST: 85 cc of Omnipaque 350   COMPARISON: Multiple priors including most recent abdominal MRI  December 06, 2019 and most recent CT abdomen November 01, 2018.   FINDINGS:  Lower chest: No acute abnormality.   Hepatobiliary: Stable 8 mm hypodense lesion in the medial segment of  the left lobe which is too small to accurately characterize but  stable dating back to at least January 15, 2018, consistent with a  benign etiology. No solid enhancing renal mass. Gallbladder is  unremarkable. No biliary ductal dilation.   Pancreas: No pancreatic ductal dilation or evidence of acute  inflammation.   Spleen: Enhancing hypodense 13 mm splenic lesion again demonstrates  delayed hyperenhancement and is stable dating back  to at least  January 15, 2018, likely reflecting a hemangioma.   Adrenals/Urinary Tract: Bilateral adrenal glands are unremarkable.   Hypoenhancing left lower pole renal lesion measures 2.4 x 2.1 x 2.4  cm on images 81/7 and 64/1000, measuring 2.1 x 2.0 cm on most recent  prior MRI and 1.9 x 1.6 x 1.7 cm on CT November 01, 2018.   Hypodense left interpolar renal lesion is too small to accurately  characterize but stable over multiple priors and likely reflects a  cyst.   Right kidney is unremarkable. No hydronephrosis.   Stomach/Bowel: No enteric contrast was administered. Small hiatal  hernia otherwise stomach is unremarkable for degree of distension.  No pathologic dilation or evidence of acute inflammation involving  loops of small or large bowel in the abdomen. Fatty infiltration of  the colonic wall extending from the mid transverse colon through the  visualized portions of the descending colon.   Vascular/Lymphatic: Aortic atherosclerosis without aneurysmal  dilation. No evidence of tumor in vein. Prominent retroperitoneal  lymph nodes are stable dating back to at least 2020 and favored  reactive. No pathologically enlarged abdominal lymph nodes.   Other: No abdominal ascites.   Musculoskeletal: Extensive posterior spinal fixation hardware.  Multilevel degenerative changes spine. No acute osseous abnormality.   IMPRESSION:  1. 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  2. No evidence of tumor in vein or metastatic disease within the  abdomen.  3. Prominent retroperitoneal lymph nodes are stable dating back to  at least 2020 and favored reactive.  4. Fatty infiltration of the colonic wall extending from the mid  transverse colon through the visualized portions of the descending  colon, which can be seen in the setting of chronic inflammatory  bowel disease.  5.  Aortic Atherosclerosis (ICD10-I70.0).    Electronically Signed  By: Dahlia Bailiff M.D.  On: 12/05/2020 10:13  Source Of History:  Patient  Records Review:   Previous Doctor Records, Previous Patient Records  Urine Test Review:   Urinalysis, Urine Culture   02/05/21  Urinalysis  Urine Appearance Slightly Cloudy   Urine Color Yellow   Urine Glucose Neg mg/dL  Urine Bilirubin Neg mg/dL  Urine Ketones Neg mg/dL  Urine Specific Gravity 1.020   Urine Blood Neg ery/uL  Urine pH 7.5   Urine Protein Trace mg/dL  Urine Urobilinogen 0.2 mg/dL  Urine Nitrites Neg   Urine Leukocyte Esterase Neg leu/uL  Urine WBC/hpf 0 - 5/hpf   Urine RBC/hpf NS (Not Seen)   Urine Epithelial Cells 0 - 5/hpf   Urine Bacteria Few (10-25/hpf)   Urine Mucous Not Present   Urine Yeast NS (Not Seen)   Urine Trichomonas Not Present   Urine Cystals Amorph Phosphates   Urine Casts NS (Not Seen)   Urine Sperm Not Present   Urine C&S  Culture, Urine -  PROCEDURES:          Urinalysis w/Scope Dipstick Dipstick Cont'd Micro  Color: Yellow Bilirubin: Neg mg/dL WBC/hpf: 0 - 5/hpf  Appearance: Slightly Cloudy Ketones: Neg mg/dL RBC/hpf: NS (Not Seen)  Specific Gravity: 1.020 Blood: Neg ery/uL Bacteria: Few (10-25/hpf)  pH: 7.5 Protein: Trace mg/dL Cystals: Amorph Phosphates  Glucose: Neg mg/dL Urobilinogen: 0.2 mg/dL Casts: NS (Not Seen)    Nitrites: Neg Trichomonas: Not Present    Leukocyte Esterase: Neg leu/uL Mucous: Not Present      Epithelial Cells: 0 - 5/hpf      Yeast: NS (Not Seen)      Sperm: Not Present    ASSESSMENT:      ICD-10 Details  1 GU:   Left renal neoplasm - D49.512 Chronic, Stable   PLAN:           Orders Labs CULTURE, URINE          Document Letter(s):  Created for Patient: Clinical Summary         Notes: -I personally reviewed imaging results and films with the patient. We discussed that the mass in question has features concerning for malignancy. I explained the natural  history of presumed renal cell carcinoma. I reviewed the AUA guidelines for evaluation and treatment of the small renal mass. The options of active surveillance, in situ tumor ablation, partial and radical nephrectomy was discussed. The risks of robotic LEFT partial nephrectomy were discussed in detail including but not limited to: negative pathology, open conversion, completion nephrectomy, infection of the urinary tract/skin/abdominal cavity, VTE, MI/CVA, lymphatic leak, injury to adjacent solid/hollow viscus organs, bleeding requiring a blood transfusion, catastrophic bleeding, hernia formation, need for postoperative angioembolization, urinary leak requiring stent/drain, and other imponderables.

## 2021-02-19 NOTE — Anesthesia Postprocedure Evaluation (Signed)
Anesthesia Post Note  Patient: Emma Martin  Procedure(s) Performed: XI ROBOTIC ASSITED PARTIAL NEPHRECTOMY (Left)     Patient location during evaluation: PACU Anesthesia Type: General Level of consciousness: awake and alert Pain management: pain level controlled Vital Signs Assessment: post-procedure vital signs reviewed and stable Respiratory status: spontaneous breathing, nonlabored ventilation, respiratory function stable and patient connected to nasal cannula oxygen Cardiovascular status: blood pressure returned to baseline and stable Postop Assessment: no apparent nausea or vomiting Anesthetic complications: no   No notable events documented.  Last Vitals:  Vitals:   02/19/21 1200 02/19/21 1300  BP: 106/72 104/68  Pulse: 97 96  Resp: (!) 21 19  Temp: 36.4 C   SpO2: 100% 100%    Last Pain:  Vitals:   02/19/21 1300  TempSrc:   PainSc: 0-No pain                 Dean Wonder,W. EDMOND

## 2021-02-19 NOTE — Plan of Care (Signed)
  Problem: Education: Goal: Knowledge of General Education information will improve Description Including pain rating scale, medication(s)/side effects and non-pharmacologic comfort measures Outcome: Progressing   

## 2021-02-19 NOTE — Discharge Instructions (Signed)

## 2021-02-19 NOTE — Discharge Summary (Signed)
[facility] Discharge Summary  Admit date: 02/19/2021  Discharge date and time: 02/19/21   Discharge to: Home  Discharge Service: Urology  Discharge Attending Physician:  Ellison Hughs, MD  Discharge  Diagnoses: Renal mass  Secondary Diagnosis: Principal Problem:   Renal mass   OR Procedures: Procedure(s): XI ROBOTIC ASSITED PARTIAL NEPHRECTOMY 02/19/2021   Ancillary Procedures: None   Discharge Day Services: The patient was seen and examined by the Urology team both in the morning and immediately prior to discharge.  Vital signs and laboratory values were stable and within normal limits.  The physical exam was benign and unchanged and all surgical wounds were examined.  Discharge instructions were explained and all questions answered.  Subjective  No acute events overnight. Pain Controlled. No fever or chills.  Objective Patient Vitals for the past 8 hrs:  BP Temp Temp src Pulse Resp SpO2  02/19/21 2302 107/61 98.3 F (36.8 C) Oral (!) 103 16 94 %  02/19/21 1813 101/67 98.2 F (36.8 C) Oral 96 16 97 %   Total I/O In: 100 [IV Piggyback:100] Out: 800 [Urine:800]  General Appearance:        No acute distress Lungs:                       Normal work of breathing on room air Heart:                                Regular rate and rhythm Abdomen:                         Soft, non-tender, non-distended. Incisions C/D/I GU:   Voiding spontaneously Extremities:                      Warm and well perfused   Hospital Course:  The patient underwent Robotic Left Partial Nehphrectomy on 02/19/2021.  The patient tolerated the procedure well, was extubated in the OR, and afterwards was taken to the PACU for routine post-surgical care. When stable the patient was transferred to the floor.   The patient did well postoperatively.  The patients diet was slowly advanced and at the time of discharge was tolerating a regular diet.  The patient was discharged home Day of Surgery, at  which point was tolerating a regular solid diet, was able to void spontaneously, have adequate pain control with P.O. pain medication, and could ambulate without difficulty. The patient will follow up with [place] for post op check.   Condition at Discharge: Improved  Discharge Medications:  Allergies as of 02/19/2021       Reactions   Benzoin Rash        Medication List     STOP taking these medications    CENTRUM PO       TAKE these medications    atorvastatin 20 MG tablet Commonly known as: LIPITOR Take 1 tablet (20 mg total) by mouth daily at 6 PM.   docusate sodium 100 MG capsule Commonly known as: COLACE Take 1 capsule (100 mg total) by mouth 2 (two) times daily.   FLUoxetine 40 MG capsule Commonly known as: PROZAC Take 1 capsule (40 mg total) by mouth daily.   fluticasone 50 MCG/ACT nasal spray Commonly known as: FLONASE Place 2 sprays into both nostrils daily.   HYDROcodone-acetaminophen 5-325 MG tablet Commonly known as: Norco Take 1-2 tablets  by mouth every 6 (six) hours as needed for moderate pain.   levothyroxine 75 MCG tablet Commonly known as: SYNTHROID Take 1 tablet (75 mcg total) by mouth daily before breakfast.   lisinopril 10 MG tablet Commonly known as: ZESTRIL Take 10 mg by mouth daily.   promethazine 12.5 MG tablet Commonly known as: PHENERGAN Take 1 tablet (12.5 mg total) by mouth every 4 (four) hours as needed for nausea or vomiting.

## 2021-02-19 NOTE — Anesthesia Procedure Notes (Signed)
Procedure Name: Intubation Date/Time: 02/19/2021 7:29 AM Performed by: Eben Burow, CRNA Pre-anesthesia Checklist: Patient identified, Emergency Drugs available, Suction available, Patient being monitored and Timeout performed Patient Re-evaluated:Patient Re-evaluated prior to induction Oxygen Delivery Method: Circle system utilized Preoxygenation: Pre-oxygenation with 100% oxygen Induction Type: IV induction Ventilation: Mask ventilation without difficulty Laryngoscope Size: Mac and 4 Grade View: Grade I Tube type: Oral Tube size: 7.0 mm Number of attempts: 1 Airway Equipment and Method: Stylet Placement Confirmation: ETT inserted through vocal cords under direct vision, positive ETCO2 and breath sounds checked- equal and bilateral Secured at: 21 cm Tube secured with: Tape Dental Injury: Teeth and Oropharynx as per pre-operative assessment

## 2021-02-20 ENCOUNTER — Encounter (HOSPITAL_COMMUNITY): Payer: Self-pay | Admitting: Urology

## 2021-02-20 DIAGNOSIS — C642 Malignant neoplasm of left kidney, except renal pelvis: Secondary | ICD-10-CM | POA: Diagnosis not present

## 2021-02-20 LAB — BASIC METABOLIC PANEL
Anion gap: 6 (ref 5–15)
BUN: 9 mg/dL (ref 6–20)
CO2: 24 mmol/L (ref 22–32)
Calcium: 8.2 mg/dL — ABNORMAL LOW (ref 8.9–10.3)
Chloride: 106 mmol/L (ref 98–111)
Creatinine, Ser: 0.37 mg/dL — ABNORMAL LOW (ref 0.44–1.00)
GFR, Estimated: >60 mL/min (ref >60)
Glucose, Bld: 109 mg/dL — ABNORMAL HIGH (ref 70–99)
Potassium: 3.6 mmol/L (ref 3.5–5.1)
Sodium: 136 mmol/L (ref 135–145)

## 2021-02-20 LAB — CBC
HCT: 36.3 % (ref 36.0–46.0)
Hemoglobin: 12.1 g/dL (ref 12.0–15.0)
MCH: 28.1 pg (ref 26.0–34.0)
MCHC: 33.3 g/dL (ref 30.0–36.0)
MCV: 84.4 fL (ref 80.0–100.0)
Platelets: 192 10*3/uL (ref 150–400)
RBC: 4.3 MIL/uL (ref 3.87–5.11)
RDW: 13.9 % (ref 11.5–15.5)
WBC: 10.1 10*3/uL (ref 4.0–10.5)
nRBC: 0 % (ref 0.0–0.2)

## 2021-02-20 LAB — SURGICAL PATHOLOGY

## 2021-02-20 NOTE — Plan of Care (Signed)

## 2021-02-20 NOTE — Care Management Obs Status (Signed)
[place] NOTIFICATION   Patient Details  Name: Emma Martin MRN: [id number] Date of Birth: September 04, 1980   Medicare Observation Status Notification Given:  Yes    MahabirJuliann Pulse, RN 02/20/2021, 10:01 AM

## 2021-03-04 ENCOUNTER — Ambulatory Visit
Admission: RE | Admit: 2021-03-04 | Discharge: 2021-03-04 | Disposition: A | Discharge status: Home / Self Care | Payer: 59 | Source: Ambulatory Visit | Attending: Family Medicine | Admitting: Family Medicine

## 2021-03-04 DIAGNOSIS — R945 Abnormal results of liver function studies: Secondary | ICD-10-CM

## 2021-06-24 ENCOUNTER — Ambulatory Visit (HOSPITAL_COMMUNITY)
Admission: RE | Admit: 2021-06-24 | Discharge: 2021-06-24 | Disposition: A | Discharge status: Home / Self Care | Payer: 59 | Source: Ambulatory Visit | Attending: Urology | Admitting: Urology

## 2021-06-24 ENCOUNTER — Other Ambulatory Visit (HOSPITAL_COMMUNITY): Payer: Self-pay | Admitting: Urology

## 2021-06-24 DIAGNOSIS — Z85528 Personal history of other malignant neoplasm of kidney: Secondary | ICD-10-CM | POA: Diagnosis present

## 2022-02-19 ENCOUNTER — Other Ambulatory Visit: Payer: Self-pay | Admitting: Family Medicine

## 2022-02-19 DIAGNOSIS — Z1231 Encounter for screening mammogram for malignant neoplasm of breast: Secondary | ICD-10-CM

## 2022-02-20 ENCOUNTER — Ambulatory Visit: Payer: 59 | Attending: Family Medicine

## 2022-02-20 ENCOUNTER — Other Ambulatory Visit: Payer: Self-pay

## 2022-02-20 DIAGNOSIS — R2681 Unsteadiness on feet: Secondary | ICD-10-CM | POA: Insufficient documentation

## 2022-02-20 DIAGNOSIS — R262 Difficulty in walking, not elsewhere classified: Secondary | ICD-10-CM | POA: Diagnosis present

## 2022-02-20 DIAGNOSIS — M6281 Muscle weakness (generalized): Secondary | ICD-10-CM | POA: Insufficient documentation

## 2022-02-20 NOTE — Therapy (Signed)
OUTPATIENT PHYSICAL THERAPY NEURO EVALUATION   Patient Name: Emma Martin MRN: [id number] DOB:1980-04-22, 42 y.o., female Today's Date: 02/20/2022   PCP: Glenis Smoker, MD REFERRING PROVIDER: Glenis Smoker, MD  END OF SESSION:  PT End of Session - 02/20/22 1401     Visit Number 1    Authorization Type [employer] Medicare    PT Start Time 1400    PT Stop Time 3790    PT Time Calculation (min) 45 min             Past Medical History:  Diagnosis Date   Bicuspid aortic valve    , mild aortic insufficiency. (No SBE prophylaxis needed)   Complication of anesthesia    Dyslipidemia    Generalized anxiety disorder    , with obsessive-compulsive traits.   Hypertension    Hypothyroidism    Meningitis    hx of   Mixed gonadal dysgenesis    , (additional Y-bearing cell line) for which she reportedly underwent bilateral gonadectomy (patient unaware; needs confirmation) due to malignancy potential.   PONV (postoperative nausea and vomiting)    Primary amenorrhea    , treated starting at age 37 or 9 with estrogen and progesterone   Scoliosis    , treated with Harrington rods (1997) for stabilization.   Seasonal allergic rhinitis    Short stature    , previously treated with growth hormone age 23-15 years.   Stroke [facility])    Turner syndrome    , previously followed by pediatric endocrinologist Freddy Jaksch, MD at [facility] through her early 38s).   Past Surgical History:  Procedure Laterality Date   left cochlear implant  Bilateral    MANDIBLE FRACTURE SURGERY     NASAL SINUS SURGERY     ROBOTIC ASSITED PARTIAL NEPHRECTOMY Left 02/19/2021   Procedure: XI ROBOTIC ASSITED PARTIAL NEPHRECTOMY;  Surgeon: Ceasar Mons, MD;  Location: [facility];  Service: Urology;  Laterality: Left;   scoliosis surgery      skin removed from neck      TEE WITHOUT CARDIOVERSION N/A 01/19/2018   Procedure: TRANSESOPHAGEAL ECHOCARDIOGRAM (TEE);  Surgeon: Jerline Pain, MD;  Location: [facility] ENDOSCOPY;  Service: Cardiovascular;  Laterality: N/A;   TEE WITHOUT CARDIOVERSION N/A 02/02/2018   Procedure: TRANSESOPHAGEAL ECHOCARDIOGRAM (TEE);  Surgeon: Elouise Munroe, MD;  Location: [place];  Service: Cardiology;  Laterality: N/A;   Patient Active Problem List   Diagnosis Date Noted   Renal mass 02/19/2021   Sinusitis 03/03/2018   Right thalamic infarction ([place]) 02/03/2018   Cerebrovascular accident (CVA) ([place])    Benign essential HTN    Tachypnea    Sensorineural hearing loss (SNHL) of both ears 01/31/2018   Dyslipidemia 01/31/2018   Acute ischemic stroke ([place]) 01/30/2018   Pneumococcal bacteremia    Pneumococcal meningitis    Turner syndrome    Hypothyroidism 01/15/2018   Generalized anxiety disorder 01/15/2018   Bicuspid aortic valve 04/06/2013   HTN (hypertension) 04/06/2013    ONSET DATE: 2019  REFERRING DIAG: G81.94 (ICD-10-CM) - Hemiplegia, unspecified affecting left nondominant side Z86.73 (ICD-10-CM) - Personal history of transient ischemic attack (TIA), and cerebral infarction without residual deficits R26.9 (ICD-10-CM) - Unspecified abnormalities of gait and mobility  THERAPY DIAG:  No diagnosis found.  Rationale for Evaluation and Treatment: Rehabilitation  SUBJECTIVE:  SUBJECTIVE STATEMENT: "Getting around the house ok, just not how I was before". Gait deviations since time of bacterial meningitis and suffered subsequent CVA thereafter affecting left side (LLE > LUE)  Pt accompanied by: family member-father  PERTINENT HISTORY: Turner syndrome and a sudden, bilateral sensorineural hearing loss secondary to bacterial meningitis. Hx of CVA  PAIN:  Are you having pain? No  PRECAUTIONS: None  WEIGHT BEARING RESTRICTIONS: No  FALLS: Has  patient fallen in last 6 months? Yes. Number of falls 1 walking down steps  LIVING ENVIRONMENT: Lives with: lives with their family Lives in: House/apartment Stairs: external/internal, Stair lift inside but does not need it Has following equipment at home: Grab bars  PLOF: Independent and Independent with gait  PATIENT GOALS: balance and walking deficits  OBJECTIVE:   DIAGNOSTIC FINDINGS: none as of late  COGNITION: Overall cognitive status: History of cognitive impairments - at baseline Needs increased volume for hearing. Cochlear implant  SENSATION: WFL  COORDINATION: gross motor WFL, decr fluidity LLE in heel to shin and rapid/alternating pattern   MUSCLE TONE: WNL    POSTURE: No Significant postural limitations  LOWER EXTREMITY ROM:     Active  Right Eval Left Eval  Hip flexion    Hip extension    Hip abduction    Hip adduction    Hip internal rotation    Hip external rotation    Knee flexion    Knee extension    Ankle dorsiflexion WNL WNL  Ankle plantarflexion    Ankle inversion    Ankle eversion     (Blank rows = not tested)  LOWER EXTREMITY MMT:    MMT Right Eval Left Eval  Hip flexion 5 5  Hip extension    Hip abduction 5 4  Hip adduction    Hip internal rotation    Hip external rotation    Knee flexion 5 5  Knee extension 5 4-  Ankle dorsiflexion 5 4-  Ankle plantarflexion 3+ 3  Ankle inversion    Ankle eversion    (Blank rows = not tested)  BED MOBILITY:  indep  TRANSFERS: Assistive device utilized: None  Sit to stand: Complete Independence Stand to sit: Complete Independence Chair to chair: Complete Independence Floor: DNT    CURB:  Level of Assistance: Complete Independence Assistive device utilized: None Curb Comments:   STAIRS: Level of Assistance: Modified independence Stair Negotiation Technique: Alternating Pattern  with Single Rail on Right Number of Stairs: 15  Height of Stairs: 4-6  Comments:    GAIT: Gait pattern:  decrease in LLE terminal knee extension in initial contact Distance walked: community Assistive device utilized: None Level of assistance: Complete Independence Comments:   FUNCTIONAL TESTS:  5 times sit to stand: 13.91 sec Timed up and go (TUG): 14.53 Berg Balance Scale: 48/56 Dynamic Gait Index: 16/24  M-CTSIB  Condition 1: Firm Surface, EO 30 Sec, Normal Sway  Condition 2: Firm Surface, EC 30 Sec, Normal Sway  Condition 3: Foam Surface, EO 30 Sec, Normal and Mild Sway  Condition 4: Foam Surface, EC 5-10 Sec, Moderate and Severe Sway      TODAY'S TREATMENT:  DATE: 02/20/22    PATIENT EDUCATION: Education details: assessment findings, outcome measures, rationale of PT intervention Person educated: Patient and Parent Education method: Explanation and Handouts Education comprehension: verbalized understanding, returned demonstration, and needs further education  HOME EXERCISE PROGRAM: Access Code: [email or web address] URL: [email or web address] Date: 02/20/2022 Prepared by: Sherlyn Lees  Exercises - Corner Balance Feet Together With Eyes Open  - 1 x daily - 7 x weekly - 3 sets - 30 sec hold - Corner Balance Feet Together With Eyes Closed  - 1 x daily - 7 x weekly - 3 sets - 30 sec hold - Corner Balance Feet Together: Eyes Open With Head Turns  - 1 x daily - 7 x weekly - 3 sets - 3-5 reps - Corner Balance Feet Together: Eyes Closed With Head Turns  - 1 x daily - 7 x weekly - 3 sets - 3-5 reps  GOALS: Goals reviewed with patient? Yes  SHORT TERM GOALS: Target date: same as LTG   LONG TERM GOALS: Target date: 03/20/2022    Patient will be independent in HEP to improve functional outcomes Baseline:  Goal status: INITIAL  2.  Reduce risk for falls per score 20/24 Dynamic Gait Index Baseline: 16/24 Goal status:  INITIAL  3.  Improve left dorsiflexion and knee extension strength to 4+/5 to improve gait mechanics at initial contact Baseline: 4-/5 Goal status: INITIAL  4.  Improve postural stability and balance in low light conditions per mild sway condition 4 M-CTSIB Baseline: mod-severe Goal status: INITIAL    ASSESSMENT:  CLINICAL IMPRESSION: Patient is a 42 y.o. lady who was seen today for physical therapy evaluation and treatment for hx of CVA and gait deviations. Demonstrates balance and dynamic motor control deficits affecting gait and balance with high risk for falls per Dynamic Gait Index score and postural instability as revelaed with M-CTSIB. LLE weakness also present and a likely contributing factor to mobility issues.  Pt would benefit from PT services to address deficits and limitations.    OBJECTIVE IMPAIRMENTS: Abnormal gait, decreased balance, decreased coordination, difficulty walking, and decreased strength.   ACTIVITY LIMITATIONS: carrying, stairs, and locomotion level  PARTICIPATION LIMITATIONS: community activity  PERSONAL FACTORS: Time since onset of injury/illness/exacerbation and 1-2 comorbidities: hx of CVA, meningitis  are also affecting patient's functional outcome.   REHAB POTENTIAL: Good  CLINICAL DECISION MAKING: Stable/uncomplicated  EVALUATION COMPLEXITY: Moderate  PLAN:  PT FREQUENCY: 1x/week  PT DURATION: 4 weeks  PLANNED INTERVENTIONS: Therapeutic exercises, Therapeutic activity, Neuromuscular re-education, Balance training, Gait training, Patient/Family education, Self Care, Joint mobilization, Stair training, Vestibular training, Canalith repositioning, Orthotic/Fit training, DME instructions, Aquatic Therapy, Dry Needling, Electrical stimulation, Spinal mobilization, Cryotherapy, Moist heat, Taping, Ionotophoresis '4mg'$ /ml Dexamethasone, and Manual therapy  PLAN FOR NEXT SESSION: HEP review (progress corner balance to semi-tandem if capable), ankle  strength (calf raise, toe raise with back against wall for DF)   5:51 PM, 02/20/22 M. Sherlyn Lees, PT, DPT Physical Therapist- [place] Office Number: [phone]

## 2022-02-24 ENCOUNTER — Ambulatory Visit: Payer: 59

## 2022-02-24 DIAGNOSIS — M6281 Muscle weakness (generalized): Secondary | ICD-10-CM

## 2022-02-24 DIAGNOSIS — R2681 Unsteadiness on feet: Secondary | ICD-10-CM

## 2022-02-24 DIAGNOSIS — R262 Difficulty in walking, not elsewhere classified: Secondary | ICD-10-CM | POA: Diagnosis not present

## 2022-02-24 NOTE — Therapy (Signed)
OUTPATIENT PHYSICAL THERAPY NEURO TREATMENT   Patient Name: Emma Martin MRN: [id number] DOB:1980/07/29, 42 y.o., female Today's Date: 02/24/2022   PCP: Glenis Smoker, MD REFERRING PROVIDER: Glenis Smoker, MD  END OF SESSION:  PT End of Session - 02/24/22 1019     Visit Number 2    Number of Visits 4    Date for PT Re-Evaluation 03/20/22    Authorization Type UHC Medicare    PT Start Time 1018    PT Stop Time 1100    PT Time Calculation (min) 42 min             Past Medical History:  Diagnosis Date   Bicuspid aortic valve    , mild aortic insufficiency. (No SBE prophylaxis needed)   Complication of anesthesia    Dyslipidemia    Generalized anxiety disorder    , with obsessive-compulsive traits.   Hypertension    Hypothyroidism    Meningitis    hx of   Mixed gonadal dysgenesis    , (additional Y-bearing cell line) for which she reportedly underwent bilateral gonadectomy (patient unaware; needs confirmation) due to malignancy potential.   PONV (postoperative nausea and vomiting)    Primary amenorrhea    , treated starting at age 71 or 9 with estrogen and progesterone   Scoliosis    , treated with Harrington rods (1997) for stabilization.   Seasonal allergic rhinitis    Short stature    , previously treated with growth hormone age 2-15 years.   Stroke [facility])    Turner syndrome    , previously followed by pediatric endocrinologist Freddy Jaksch, MD at [facility] through her early 38s).   Past Surgical History:  Procedure Laterality Date   left cochlear implant  Bilateral    MANDIBLE FRACTURE SURGERY     NASAL SINUS SURGERY     ROBOTIC ASSITED PARTIAL NEPHRECTOMY Left 02/19/2021   Procedure: XI ROBOTIC ASSITED PARTIAL NEPHRECTOMY;  Surgeon: Ceasar Mons, MD;  Location: [facility];  Service: Urology;  Laterality: Left;   scoliosis surgery      skin removed from neck      TEE WITHOUT CARDIOVERSION N/A 01/19/2018   Procedure:  TRANSESOPHAGEAL ECHOCARDIOGRAM (TEE);  Surgeon: Jerline Pain, MD;  Location: [facility] ENDOSCOPY;  Service: Cardiovascular;  Laterality: N/A;   TEE WITHOUT CARDIOVERSION N/A 02/02/2018   Procedure: TRANSESOPHAGEAL ECHOCARDIOGRAM (TEE);  Surgeon: Elouise Munroe, MD;  Location: [place];  Service: Cardiology;  Laterality: N/A;   Patient Active Problem List   Diagnosis Date Noted   Renal mass 02/19/2021   Sinusitis 03/03/2018   Right thalamic infarction ([place]) 02/03/2018   Cerebrovascular accident (CVA) ([place])    Benign essential HTN    Tachypnea    Sensorineural hearing loss (SNHL) of both ears 01/31/2018   Dyslipidemia 01/31/2018   Acute ischemic stroke ([place]) 01/30/2018   Pneumococcal bacteremia    Pneumococcal meningitis    Turner syndrome    Hypothyroidism 01/15/2018   Generalized anxiety disorder 01/15/2018   Bicuspid aortic valve 04/06/2013   HTN (hypertension) 04/06/2013    ONSET DATE: 2019  REFERRING DIAG: G81.94 (ICD-10-CM) - Hemiplegia, unspecified affecting left nondominant side Z86.73 (ICD-10-CM) - Personal history of transient ischemic attack (TIA), and cerebral infarction without residual deficits R26.9 (ICD-10-CM) - Unspecified abnormalities of gait and mobility  THERAPY DIAG:  Difficulty in walking, not elsewhere classified  Muscle weakness (generalized)  Unsteadiness on feet  Rationale for Evaluation and Treatment: Rehabilitation  SUBJECTIVE:  SUBJECTIVE STATEMENT: Doing ok, not a lot over the weekend  Pt accompanied by: family member-father  PERTINENT HISTORY: Turner syndrome and a sudden, bilateral sensorineural hearing loss secondary to bacterial meningitis. Hx of CVA  PAIN:  Are you having pain? No  PRECAUTIONS: None  WEIGHT BEARING RESTRICTIONS: No  FALLS: Has  patient fallen in last 6 months? Yes. Number of falls 1 walking down steps  LIVING ENVIRONMENT: Lives with: lives with their family Lives in: House/apartment Stairs: external/internal, Stair lift inside but does not need it Has following equipment at home: Grab bars  PLOF: Independent and Independent with gait  PATIENT GOALS: balance and walking deficits  OBJECTIVE:   TODAY'S TREATMENT: 02/24/22 Activity Comments  Corner balance (HEP review) -feet together EO/EC x 30 sec -head turns EO/EC 3x  Corner balance -tandem stance x 30 sec -semi-tandem EC 1x30 sec  Standing ankle strength -back to wall toe raise 2x10 -facing wall heel raise 2x10  Gait training -resisted gait -large amplitude arm swing  Dynamic balance w/ red stability ball -soccer pass x 2 min -bounce pass 10x -granny toss 10x -side stance twist pass 10x       PATIENT EDUCATION: Education details: HEP reinforcement Person educated: Patient and Parent Education method: Theatre stage manager Education comprehension: verbalized understanding, returned demonstration, and needs further education  HOME EXERCISE PROGRAM: Access Code: [email or web address] URL: [email or web address] Date: 02/20/2022 Prepared by: Sherlyn Lees  Exercises - Corner Balance Feet Together With Eyes Open  - 1 x daily - 7 x weekly - 3 sets - 30 sec hold - Corner Balance Feet Together With Eyes Closed  - 1 x daily - 7 x weekly - 3 sets - 30 sec hold - Corner Balance Feet Together: Eyes Open With Head Turns  - 1 x daily - 7 x weekly - 3 sets - 3-5 reps - Corner Balance Feet Together: Eyes Closed With Head Turns  - 1 x daily - 7 x weekly - 3 sets - 3-5 reps - Tandem Stance  - 1 x daily - 7 x weekly - 3 sets - 30 sec hold - Semi-Tandem Corner Balance With Eyes Closed  - 1 x daily - 7 x weekly - 3 sets - 30 sec hold - Toe Raise With Back Against Wall  - 1 x daily - 7 x weekly - 3 sets - 10 reps - Isometric Heel Raise at Wall  - 1 x daily - 7 x  weekly - 3 sets - 10 reps  DIAGNOSTIC FINDINGS: none as of late  COGNITION: Overall cognitive status: History of cognitive impairments - at baseline Needs increased volume for hearing. Cochlear implant  SENSATION: WFL  COORDINATION: gross motor WFL, decr fluidity LLE in heel to shin and rapid/alternating pattern   MUSCLE TONE: WNL    POSTURE: No Significant postural limitations  LOWER EXTREMITY ROM:     Active  Right Eval Left Eval  Hip flexion    Hip extension    Hip abduction    Hip adduction    Hip internal rotation    Hip external rotation    Knee flexion    Knee extension    Ankle dorsiflexion WNL WNL  Ankle plantarflexion    Ankle inversion    Ankle eversion     (Blank rows = not tested)  LOWER EXTREMITY MMT:    MMT Right Eval Left Eval  Hip flexion 5 5  Hip extension    Hip abduction 5 4  Hip adduction  Hip internal rotation    Hip external rotation    Knee flexion 5 5  Knee extension 5 4-  Ankle dorsiflexion 5 4-  Ankle plantarflexion 3+ 3  Ankle inversion    Ankle eversion    (Blank rows = not tested)  BED MOBILITY:  indep  TRANSFERS: Assistive device utilized: None  Sit to stand: Complete Independence Stand to sit: Complete Independence Chair to chair: Complete Independence Floor: DNT    CURB:  Level of Assistance: Complete Independence Assistive device utilized: None Curb Comments:   STAIRS: Level of Assistance: Modified independence Stair Negotiation Technique: Alternating Pattern  with Single Rail on Right Number of Stairs: 15  Height of Stairs: 4-6  Comments:   GAIT: Gait pattern:  decrease in LLE terminal knee extension in initial contact Distance walked: community Assistive device utilized: None Level of assistance: Complete Independence Comments:   FUNCTIONAL TESTS:  5 times sit to stand: 13.91 sec Timed up and go (TUG): 14.53 Berg Balance Scale: 48/56 Dynamic Gait Index: 16/24  M-CTSIB  Condition 1:  Firm Surface, EO 30 Sec, Normal Sway  Condition 2: Firm Surface, EC 30 Sec, Normal Sway  Condition 3: Foam Surface, EO 30 Sec, Normal and Mild Sway  Condition 4: Foam Surface, EC 5-10 Sec, Moderate and Severe Sway          GOALS: Goals reviewed with patient? Yes  SHORT TERM GOALS: Target date: same as LTG   LONG TERM GOALS: Target date: 03/20/2022    Patient will be independent in HEP to improve functional outcomes Baseline:  Goal status: INITIAL  2.  Reduce risk for falls per score 20/24 Dynamic Gait Index Baseline: 16/24 Goal status: INITIAL  3.  Improve left dorsiflexion and knee extension strength to 4+/5 to improve gait mechanics at initial contact Baseline: 4-/5 Goal status: INITIAL  4.  Improve postural stability and balance in low light conditions per mild sway condition 4 M-CTSIB Baseline: mod-severe Goal status: INITIAL    ASSESSMENT:  CLINICAL IMPRESSION: Required guidance for HEP recall. Addition of two balance and two strength exercises to improve coordination and stability to enhance balance and reduce risk for falls. Continued with reactive balance activities by way of external perturbation and improving coordination. Gait training to enforce heel strike initial contact and larger step length. Good carryover with large arm swing to promote step length.   OBJECTIVE IMPAIRMENTS: Abnormal gait, decreased balance, decreased coordination, difficulty walking, and decreased strength.   ACTIVITY LIMITATIONS: carrying, stairs, and locomotion level  PARTICIPATION LIMITATIONS: community activity  PERSONAL FACTORS: Time since onset of injury/illness/exacerbation and 1-2 comorbidities: hx of CVA, meningitis  are also affecting patient's functional outcome.   REHAB POTENTIAL: Good  CLINICAL DECISION MAKING: Stable/uncomplicated  EVALUATION COMPLEXITY: Moderate  PLAN:  PT FREQUENCY: 1x/week  PT DURATION: 4 weeks  PLANNED INTERVENTIONS: Therapeutic  exercises, Therapeutic activity, Neuromuscular re-education, Balance training, Gait training, Patient/Family education, Self Care, Joint mobilization, Stair training, Vestibular training, Canalith repositioning, Orthotic/Fit training, DME instructions, Aquatic Therapy, Dry Needling, Electrical stimulation, Spinal mobilization, Cryotherapy, Moist heat, Taping, Ionotophoresis '4mg'$ /ml Dexamethasone, and Manual therapy  PLAN FOR NEXT SESSION: HEP review, additional gait train for large amplitude left side movements, backwards walk, sidestep, etc   10:19 AM, 02/24/22 M. Sherlyn Lees, PT, DPT Physical Therapist- [place] Office Number: [phone]

## 2022-03-05 ENCOUNTER — Encounter: Payer: Self-pay | Admitting: Physical Therapy

## 2022-03-05 ENCOUNTER — Ambulatory Visit: Payer: 59 | Admitting: Physical Therapy

## 2022-03-05 DIAGNOSIS — R2681 Unsteadiness on feet: Secondary | ICD-10-CM

## 2022-03-05 DIAGNOSIS — M6281 Muscle weakness (generalized): Secondary | ICD-10-CM

## 2022-03-05 DIAGNOSIS — R262 Difficulty in walking, not elsewhere classified: Secondary | ICD-10-CM | POA: Diagnosis not present

## 2022-03-05 NOTE — Therapy (Signed)
OUTPATIENT PHYSICAL THERAPY NEURO TREATMENT   Patient Name: Emma Martin MRN: [id number] DOB:August 17, 1980, 42 y.o., female Today's Date: 03/05/2022   PCP: Glenis Smoker, MD REFERRING PROVIDER: Glenis Smoker, MD  END OF SESSION:  PT End of Session - 03/05/22 1036     Visit Number 3    Number of Visits 4    Date for PT Re-Evaluation 03/20/22    Authorization Type [employer] Medicare    Authorization - Number of Visits 30    PT Start Time 1019    PT Stop Time 1057    PT Time Calculation (min) 38 min    Activity Tolerance Patient tolerated treatment well    Behavior During Therapy WFL for tasks assessed/performed              Past Medical History:  Diagnosis Date   Bicuspid aortic valve    , mild aortic insufficiency. (No SBE prophylaxis needed)   Complication of anesthesia    Dyslipidemia    Generalized anxiety disorder    , with obsessive-compulsive traits.   Hypertension    Hypothyroidism    Meningitis    hx of   Mixed gonadal dysgenesis    , (additional Y-bearing cell line) for which she reportedly underwent bilateral gonadectomy (patient unaware; needs confirmation) due to malignancy potential.   PONV (postoperative nausea and vomiting)    Primary amenorrhea    , treated starting at age 67 or 61 with estrogen and progesterone   Scoliosis    , treated with Harrington rods (1997) for stabilization.   Seasonal allergic rhinitis    Short stature    , previously treated with growth hormone age 29-15 years.   Stroke [facility])    Turner syndrome    , previously followed by pediatric endocrinologist Freddy Jaksch, MD at [facility] through her early 53s).   Past Surgical History:  Procedure Laterality Date   left cochlear implant  Bilateral    MANDIBLE FRACTURE SURGERY     NASAL SINUS SURGERY     ROBOTIC ASSITED PARTIAL NEPHRECTOMY Left 02/19/2021   Procedure: XI ROBOTIC ASSITED PARTIAL NEPHRECTOMY;  Surgeon: Ceasar Mons, MD;   Location: [facility];  Service: Urology;  Laterality: Left;   scoliosis surgery      skin removed from neck      TEE WITHOUT CARDIOVERSION N/A 01/19/2018   Procedure: TRANSESOPHAGEAL ECHOCARDIOGRAM (TEE);  Surgeon: Jerline Pain, MD;  Location: [facility] ENDOSCOPY;  Service: Cardiovascular;  Laterality: N/A;   TEE WITHOUT CARDIOVERSION N/A 02/02/2018   Procedure: TRANSESOPHAGEAL ECHOCARDIOGRAM (TEE);  Surgeon: Elouise Munroe, MD;  Location: [place];  Service: Cardiology;  Laterality: N/A;   Patient Active Problem List   Diagnosis Date Noted   Renal mass 02/19/2021   Sinusitis 03/03/2018   Right thalamic infarction ([place]) 02/03/2018   Cerebrovascular accident (CVA) ([place])    Benign essential HTN    Tachypnea    Sensorineural hearing loss (SNHL) of both ears 01/31/2018   Dyslipidemia 01/31/2018   Acute ischemic stroke ([place]) 01/30/2018   Pneumococcal bacteremia    Pneumococcal meningitis    Turner syndrome    Hypothyroidism 01/15/2018   Generalized anxiety disorder 01/15/2018   Bicuspid aortic valve 04/06/2013   HTN (hypertension) 04/06/2013    ONSET DATE: 2019  REFERRING DIAG: G81.94 (ICD-10-CM) - Hemiplegia, unspecified affecting left nondominant side Z86.73 (ICD-10-CM) - Personal history of transient ischemic attack (TIA), and cerebral infarction without residual deficits R26.9 (ICD-10-CM) - Unspecified abnormalities of gait and mobility  THERAPY DIAG:  Difficulty in walking, not elsewhere classified  Unsteadiness on feet  Muscle weakness (generalized)  Rationale for Evaluation and Treatment: Rehabilitation  SUBJECTIVE:                                                                                                                                                                                             SUBJECTIVE STATEMENT:  Things are going good so far. No falls. Would like to work on balance/leg strength    Pt accompanied by: family member-father in waiting room    PERTINENT HISTORY: Turner syndrome and a sudden, bilateral sensorineural hearing loss secondary to bacterial meningitis. Hx of CVA  PAIN:  Are you having pain? No 0/10  PRECAUTIONS: None  WEIGHT BEARING RESTRICTIONS: No  FALLS: Has patient fallen in last 6 months? Yes. Number of falls 1 walking down steps  LIVING ENVIRONMENT: Lives with: lives with their family Lives in: House/apartment Stairs: external/internal, Stair lift inside but does not need it Has following equipment at home: Grab bars  PLOF: Independent and Independent with gait  PATIENT GOALS: balance and walking deficits  OBJECTIVE:   TODAY'S TREATMENT: 03/05/22  NMR  Tandem stance on foam pad 3x30 seconds B up to [place] for balance  SLS on blue foam pad with other leg on 4 inch box 3x30 seconds B up to [place] for balance Alternating toe taps to 4 inch box while standing on blue foam pad x20 MinA for balance Standing on 4 inch box with backwards toe taps to blue foam pad x10 B MinA for balance 3 way cone taps on blue foam pad up to [place] for balance, ongoing intermittent U UE assist  EC on blue foam pad 3x30 seconds up to [place] for balance   TherEx  Forward step ups 4 inch box L LE x15  Forward step downs from 4 inch box L LE on box x15  Squat hovers just above chair x10 with 3 second holds    PATIENT EDUCATION: Education details: HEP reinforcement Person educated: Patient and Parent Education method: Explanation and Handouts Education comprehension: verbalized understanding, returned demonstration, and needs further education  HOME EXERCISE PROGRAM: Access Code: [email or web address] URL: [email or web address] Date: 02/20/2022 Prepared by: Sherlyn Lees  Exercises - Corner Balance Feet Together With Eyes Open  - 1 x daily - 7 x weekly - 3 sets - 30 sec hold - Corner Balance Feet Together With Eyes Closed  - 1 x daily - 7 x weekly - 3 sets - 30 sec hold - Corner Balance Feet Together: Eyes Open With  Head Turns  - 1 x daily - 7  x weekly - 3 sets - 3-5 reps - Corner Balance Feet Together: Eyes Closed With Head Turns  - 1 x daily - 7 x weekly - 3 sets - 3-5 reps - Tandem Stance  - 1 x daily - 7 x weekly - 3 sets - 30 sec hold - Semi-Tandem Corner Balance With Eyes Closed  - 1 x daily - 7 x weekly - 3 sets - 30 sec hold - Toe Raise With Back Against Wall  - 1 x daily - 7 x weekly - 3 sets - 10 reps - Isometric Heel Raise at Wall  - 1 x daily - 7 x weekly - 3 sets - 10 reps  DIAGNOSTIC FINDINGS: none as of late  COGNITION: Overall cognitive status: History of cognitive impairments - at baseline Needs increased volume for hearing. Cochlear implant  SENSATION: WFL  COORDINATION: gross motor WFL, decr fluidity LLE in heel to shin and rapid/alternating pattern   MUSCLE TONE: WNL    POSTURE: No Significant postural limitations  LOWER EXTREMITY ROM:     Active  Right Eval Left Eval  Hip flexion    Hip extension    Hip abduction    Hip adduction    Hip internal rotation    Hip external rotation    Knee flexion    Knee extension    Ankle dorsiflexion WNL WNL  Ankle plantarflexion    Ankle inversion    Ankle eversion     (Blank rows = not tested)  LOWER EXTREMITY MMT:    MMT Right Eval Left Eval  Hip flexion 5 5  Hip extension    Hip abduction 5 4  Hip adduction    Hip internal rotation    Hip external rotation    Knee flexion 5 5  Knee extension 5 4-  Ankle dorsiflexion 5 4-  Ankle plantarflexion 3+ 3  Ankle inversion    Ankle eversion    (Blank rows = not tested)  BED MOBILITY:  indep  TRANSFERS: Assistive device utilized: None  Sit to stand: Complete Independence Stand to sit: Complete Independence Chair to chair: Complete Independence Floor: DNT    CURB:  Level of Assistance: Complete Independence Assistive device utilized: None Curb Comments:   STAIRS: Level of Assistance: Modified independence Stair Negotiation Technique: Alternating  Pattern  with Single Rail on Right Number of Stairs: 15  Height of Stairs: 4-6  Comments:   GAIT: Gait pattern:  decrease in LLE terminal knee extension in initial contact Distance walked: community Assistive device utilized: None Level of assistance: Complete Independence Comments:   FUNCTIONAL TESTS:  5 times sit to stand: 13.91 sec Timed up and go (TUG): 14.53 Berg Balance Scale: 48/56 Dynamic Gait Index: 16/24  M-CTSIB  Condition 1: Firm Surface, EO 30 Sec, Normal Sway  Condition 2: Firm Surface, EC 30 Sec, Normal Sway  Condition 3: Foam Surface, EO 30 Sec, Normal and Mild Sway  Condition 4: Foam Surface, EC 5-10 Sec, Moderate and Severe Sway          GOALS: Goals reviewed with patient? Yes  SHORT TERM GOALS: Target date: same as LTG   LONG TERM GOALS: Target date: 03/20/2022    Patient will be independent in HEP to improve functional outcomes Baseline:  Goal status: INITIAL  2.  Reduce risk for falls per score 20/24 Dynamic Gait Index Baseline: 16/24 Goal status: INITIAL  3.  Improve left dorsiflexion and knee extension strength to 4+/5 to improve gait mechanics at initial  contact Baseline: 4-/5 Goal status: INITIAL  4.  Improve postural stability and balance in low light conditions per mild sway condition 4 M-CTSIB Baseline: mod-severe Goal status: INITIAL    ASSESSMENT:  CLINICAL IMPRESSION:   Sophonie arrives today doing OK, had to communicate with her via written notes and heavy manual/visual cues due to hearing loss this session which did take a bit of extra time. Worked on strength and balance today as tolerated- will continue to progress.   OBJECTIVE IMPAIRMENTS: Abnormal gait, decreased balance, decreased coordination, difficulty walking, and decreased strength.   ACTIVITY LIMITATIONS: carrying, stairs, and locomotion level  PARTICIPATION LIMITATIONS: community activity  PERSONAL FACTORS: Time since onset of injury/illness/exacerbation  and 1-2 comorbidities: hx of CVA, meningitis  are also affecting patient's functional outcome.   REHAB POTENTIAL: Good  CLINICAL DECISION MAKING: Stable/uncomplicated  EVALUATION COMPLEXITY: Moderate  PLAN:  PT FREQUENCY: 1x/week  PT DURATION: 4 weeks  PLANNED INTERVENTIONS: Therapeutic exercises, Therapeutic activity, Neuromuscular re-education, Balance training, Gait training, Patient/Family education, Self Care, Joint mobilization, Stair training, Vestibular training, Canalith repositioning, Orthotic/Fit training, DME instructions, Aquatic Therapy, Dry Needling, Electrical stimulation, Spinal mobilization, Cryotherapy, Moist heat, Taping, Ionotophoresis 52m/ml Dexamethasone, and Manual therapy  PLAN FOR NEXT SESSION: HEP review, additional gait train for large amplitude left side movements, backwards walk, sidestep, etc   KDeniece ReePT DPT PN2

## 2022-03-10 ENCOUNTER — Ambulatory Visit: Payer: 59

## 2022-03-10 DIAGNOSIS — M6281 Muscle weakness (generalized): Secondary | ICD-10-CM

## 2022-03-10 DIAGNOSIS — R262 Difficulty in walking, not elsewhere classified: Secondary | ICD-10-CM | POA: Diagnosis not present

## 2022-03-10 DIAGNOSIS — R2681 Unsteadiness on feet: Secondary | ICD-10-CM

## 2022-03-10 NOTE — Therapy (Signed)
OUTPATIENT PHYSICAL THERAPY NEURO TREATMENT and D/C Summary   Patient Name: Emma Martin MRN: [id number] DOB:[birth year], 42 y.o., female Today's Date: 03/10/2022   PCP: Glenis Smoker, MD REFERRING PROVIDER: Glenis Smoker, MD  PHYSICAL THERAPY DISCHARGE SUMMARY  Visits from Start of Care: 4  Current functional level related to goals / functional outcomes: Improve static balance and LE strength   Remaining deficits: LLE deficits/motor coordination   Education / Equipment: HEP   Patient agrees to discharge. Patient goals were partially met. Patient is being discharged due to being pleased with the current functional level.  END OF SESSION:  PT End of Session - 03/10/22 1146     Visit Number 4    Number of Visits 4    Date for PT Re-Evaluation 03/20/22    Authorization Type [employer] Medicare    Authorization - Number of Visits 30    PT Start Time [id number]    PT Stop Time 1230    PT Time Calculation (min) 45 min    Activity Tolerance Patient tolerated treatment well    Behavior During Therapy WFL for tasks assessed/performed              Past Medical History:  Diagnosis Date   Bicuspid aortic valve    , mild aortic insufficiency. (No SBE prophylaxis needed)   Complication of anesthesia    Dyslipidemia    Generalized anxiety disorder    , with obsessive-compulsive traits.   Hypertension    Hypothyroidism    Meningitis    hx of   Mixed gonadal dysgenesis    , (additional Y-bearing cell line) for which she reportedly underwent bilateral gonadectomy (patient unaware; needs confirmation) due to malignancy potential.   PONV (postoperative nausea and vomiting)    Primary amenorrhea    , treated starting at age 29 or 89 with estrogen and progesterone   Scoliosis    , treated with Harrington rods (1997) for stabilization.   Seasonal allergic rhinitis    Short stature    , previously treated with growth hormone age 72-15 years.   Stroke [facility])    Turner  syndrome    , previously followed by pediatric endocrinologist Freddy Jaksch, MD at [facility] through her early 12s).   Past Surgical History:  Procedure Laterality Date   left cochlear implant  Bilateral    MANDIBLE FRACTURE SURGERY     NASAL SINUS SURGERY     ROBOTIC ASSITED PARTIAL NEPHRECTOMY Left 02/19/2021   Procedure: XI ROBOTIC ASSITED PARTIAL NEPHRECTOMY;  Surgeon: Ceasar Mons, MD;  Location: [facility];  Service: Urology;  Laterality: Left;   scoliosis surgery      skin removed from neck      TEE WITHOUT CARDIOVERSION N/A 01/19/2018   Procedure: TRANSESOPHAGEAL ECHOCARDIOGRAM (TEE);  Surgeon: Jerline Pain, MD;  Location: [facility] ENDOSCOPY;  Service: Cardiovascular;  Laterality: N/A;   TEE WITHOUT CARDIOVERSION N/A 02/02/2018   Procedure: TRANSESOPHAGEAL ECHOCARDIOGRAM (TEE);  Surgeon: Elouise Munroe, MD;  Location: [place];  Service: Cardiology;  Laterality: N/A;   Patient Active Problem List   Diagnosis Date Noted   Renal mass 02/19/2021   Sinusitis 03/03/2018   Right thalamic infarction [facility]) 02/03/2018   Cerebrovascular accident (CVA) ([place])    Benign essential HTN    Tachypnea    Sensorineural hearing loss (SNHL) of both ears 01/31/2018   Dyslipidemia 01/31/2018   Acute ischemic stroke ([place]) 01/30/2018   Pneumococcal bacteremia    Pneumococcal meningitis  Turner syndrome    Hypothyroidism 01/15/2018   Generalized anxiety disorder 01/15/2018   Bicuspid aortic valve 04/06/2013   HTN (hypertension) 04/06/2013    ONSET DATE: 2019  REFERRING DIAG: G81.94 (ICD-10-CM) - Hemiplegia, unspecified affecting left nondominant side Z86.73 (ICD-10-CM) - Personal history of transient ischemic attack (TIA), and cerebral infarction without residual deficits R26.9 (ICD-10-CM) - Unspecified abnormalities of gait and mobility  THERAPY DIAG:  Difficulty in walking, not elsewhere classified  Unsteadiness on feet  Muscle weakness (generalized)  Rationale  for Evaluation and Treatment: Rehabilitation  SUBJECTIVE:                                                                                                                                                                                             SUBJECTIVE STATEMENT:  Things are going good so far. No falls. Would like to work on balance/leg strength    Pt accompanied by: family member-father in waiting room   PERTINENT HISTORY: Turner syndrome and a sudden, bilateral sensorineural hearing loss secondary to bacterial meningitis. Hx of CVA  PAIN:  Are you having pain? No 0/10  PRECAUTIONS: None  WEIGHT BEARING RESTRICTIONS: No  FALLS: Has patient fallen in last 6 months? Yes. Number of falls 1 walking down steps  LIVING ENVIRONMENT: Lives with: lives with their family Lives in: House/apartment Stairs: external/internal, Stair lift inside but does not need it Has following equipment at home: Grab bars  PLOF: Independent and Independent with gait  PATIENT GOALS: balance and walking deficits  OBJECTIVE:   TODAY'S TREATMENT: 03/10/22 Activity Comments  M-CTSIB Condition 1: normal Condition 2: normal Condition 3: normal-mild Condition 4: mild-mod 8 sec  Dynamic Gait Index 17/24  HEP review   Sidestepping x 2 min   Manual mm test   Gait training Techniques to facilitate LLE advancement and trunk rotation       PATIENT EDUCATION: Education details: HEP reinforcement Person educated: Patient and Parent Education method: Explanation and Handouts Education comprehension: verbalized understanding, returned demonstration, and needs further education  HOME EXERCISE PROGRAM: Access Code: [email or web address] URL: [email or web address] Date: 02/20/2022 Prepared by: Sherlyn Lees  Exercises - Corner Balance Feet Together With Eyes Open  - 1 x daily - 7 x weekly - 3 sets - 30 sec hold - Corner Balance Feet Together With Eyes Closed  - 1 x daily - 7 x weekly - 3 sets - 30  sec hold - Corner Balance Feet Together: Eyes Open With Head Turns  - 1 x daily - 7 x weekly - 3 sets - 3-5 reps - Corner Balance Feet Together: Eyes Closed With Head Turns  -  1 x daily - 7 x weekly - 3 sets - 3-5 reps - Tandem Stance  - 1 x daily - 7 x weekly - 3 sets - 30 sec hold - Semi-Tandem Corner Balance With Eyes Closed  - 1 x daily - 7 x weekly - 3 sets - 30 sec hold - Toe Raise With Back Against Wall  - 1 x daily - 7 x weekly - 3 sets - 10 reps - Isometric Heel Raise at Wall  - 1 x daily - 7 x weekly - 3 sets - 10 reps - Side Stepping with Counter Support  - 1 x daily - 7 x weekly - 1-3 sets - 2 min rounds hold  DIAGNOSTIC FINDINGS: none as of late  COGNITION: Overall cognitive status: History of cognitive impairments - at baseline Needs increased volume for hearing. Cochlear implant  SENSATION: WFL  COORDINATION: gross motor WFL, decr fluidity LLE in heel to shin and rapid/alternating pattern   MUSCLE TONE: WNL    POSTURE: No Significant postural limitations  LOWER EXTREMITY ROM:     Active  Right Eval Left Eval  Hip flexion    Hip extension    Hip abduction    Hip adduction    Hip internal rotation    Hip external rotation    Knee flexion    Knee extension    Ankle dorsiflexion WNL WNL  Ankle plantarflexion    Ankle inversion    Ankle eversion     (Blank rows = not tested)  LOWER EXTREMITY MMT:    MMT Right Eval Left Eval  Hip flexion 5 5  Hip extension    Hip abduction 5 4  Hip adduction    Hip internal rotation    Hip external rotation    Knee flexion 5 5  Knee extension 5 4-  Ankle dorsiflexion 5 4-  Ankle plantarflexion 3+ 3  Ankle inversion    Ankle eversion    (Blank rows = not tested)  BED MOBILITY:  indep  TRANSFERS: Assistive device utilized: None  Sit to stand: Complete Independence Stand to sit: Complete Independence Chair to chair: Complete Independence Floor: DNT    CURB:  Level of Assistance: Complete  Independence Assistive device utilized: None Curb Comments:   STAIRS: Level of Assistance: Modified independence Stair Negotiation Technique: Alternating Pattern  with Single Rail on Right Number of Stairs: 15  Height of Stairs: 4-6  Comments:   GAIT: Gait pattern:  decrease in LLE terminal knee extension in initial contact Distance walked: community Assistive device utilized: None Level of assistance: Complete Independence Comments:   FUNCTIONAL TESTS:  5 times sit to stand: 13.91 sec Timed up and go (TUG): 14.53 Berg Balance Scale: 48/56 Dynamic Gait Index: 16/24  M-CTSIB  Condition 1: Firm Surface, EO 30 Sec, Normal Sway  Condition 2: Firm Surface, EC 30 Sec, Normal Sway  Condition 3: Foam Surface, EO 30 Sec, Normal and Mild Sway  Condition 4: Foam Surface, EC 5-10 Sec, Moderate and Severe Sway          GOALS: Goals reviewed with patient? Yes  SHORT TERM GOALS: Target date: same as LTG   LONG TERM GOALS: Target date: 03/20/2022    Patient will be independent in HEP to improve functional outcomes Baseline:  Goal status: MET  2.  Reduce risk for falls per score 20/24 Dynamic Gait Index Baseline: 16/24; (03/10/22) 17/24 Goal status: NOT MET  3.  Improve left dorsiflexion and knee extension strength to 4+/5  to improve gait mechanics at initial contact Baseline: 4-/5; (03/10/22) 4/5 left knee extension, 4-/5 left DF Goal status: NOT MET  4.  Improve postural stability and balance in low light conditions per mild sway condition 4 M-CTSIB Baseline: mild sway x 8 sec Goal status: MET    ASSESSMENT:  CLINICAL IMPRESSION: Pt has progressed with POC details. Demo 1-point improvement to DGI but this is mainly limitation from gait deviations from hx of neurological injury affecting her LLE. Nonetheless, pt demo improved gait with use of compensatory strategies of increased arm swing/trunk rotation. Emphasis on POC of improving stability/balance with eyes closed  and compliant surface conditions to reduce risk for falls during mobility and ADL.  Demo good mastery of HEP and will D/C at this time.    OBJECTIVE IMPAIRMENTS: Abnormal gait, decreased balance, decreased coordination, difficulty walking, and decreased strength.   ACTIVITY LIMITATIONS: carrying, stairs, and locomotion level  PARTICIPATION LIMITATIONS: community activity  PERSONAL FACTORS: Time since onset of injury/illness/exacerbation and 1-2 comorbidities: hx of CVA, meningitis  are also affecting patient's functional outcome.   REHAB POTENTIAL: Good  CLINICAL DECISION MAKING: Stable/uncomplicated  EVALUATION COMPLEXITY: Moderate  PLAN:  PT FREQUENCY: 1x/week  PT DURATION: 4 weeks  PLANNED INTERVENTIONS: Therapeutic exercises, Therapeutic activity, Neuromuscular re-education, Balance training, Gait training, Patient/Family education, Self Care, Joint mobilization, Stair training, Vestibular training, Canalith repositioning, Orthotic/Fit training, DME instructions, Aquatic Therapy, Dry Needling, Electrical stimulation, Spinal mobilization, Cryotherapy, Moist heat, Taping, Ionotophoresis 25m/ml Dexamethasone, and Manual therapy  PLAN FOR NEXT SESSION: D/C to HEP   12:38 PM, 03/10/22 M. KSherlyn Lees PT, DPT Physical Therapist- COltonOffice Number: [phone]

## 2022-03-18 ENCOUNTER — Ambulatory Visit: Payer: Medicaid Other | Admitting: Physical Therapy

## 2022-04-09 ENCOUNTER — Ambulatory Visit
Admission: RE | Admit: 2022-04-09 | Discharge: 2022-04-09 | Disposition: A | Discharge status: Home / Self Care | Payer: Medicaid Other | Source: Ambulatory Visit | Attending: Family Medicine | Admitting: Family Medicine

## 2022-04-09 DIAGNOSIS — Z1231 Encounter for screening mammogram for malignant neoplasm of breast: Secondary | ICD-10-CM

## 2022-06-18 ENCOUNTER — Other Ambulatory Visit (HOSPITAL_COMMUNITY): Payer: Self-pay | Admitting: Urology

## 2022-06-18 ENCOUNTER — Ambulatory Visit (HOSPITAL_COMMUNITY)
Admission: RE | Admit: 2022-06-18 | Discharge: 2022-06-18 | Disposition: A | Discharge status: Home / Self Care | Payer: 59 | Source: Ambulatory Visit | Attending: Urology | Admitting: Urology

## 2022-06-18 DIAGNOSIS — C642 Malignant neoplasm of left kidney, except renal pelvis: Secondary | ICD-10-CM | POA: Insufficient documentation

## 2023-03-06 ENCOUNTER — Other Ambulatory Visit: Payer: Self-pay | Admitting: Family Medicine

## 2023-03-06 DIAGNOSIS — Z0000 Encounter for general adult medical examination without abnormal findings: Secondary | ICD-10-CM

## 2023-04-16 ENCOUNTER — Ambulatory Visit
Admission: RE | Admit: 2023-04-16 | Discharge: 2023-04-16 | Disposition: A | Discharge status: Home / Self Care | Payer: Medicaid Other | Source: Ambulatory Visit | Attending: Family Medicine | Admitting: Family Medicine

## 2023-04-16 DIAGNOSIS — Z0000 Encounter for general adult medical examination without abnormal findings: Secondary | ICD-10-CM

## 2023-07-21 ENCOUNTER — Other Ambulatory Visit (HOSPITAL_COMMUNITY): Payer: Self-pay | Admitting: Urology

## 2023-07-21 ENCOUNTER — Ambulatory Visit (HOSPITAL_COMMUNITY)
Admission: RE | Admit: 2023-07-21 | Discharge: 2023-07-21 | Disposition: A | Discharge status: Home / Self Care | Source: Ambulatory Visit | Attending: Urology | Admitting: Urology

## 2023-07-21 DIAGNOSIS — C642 Malignant neoplasm of left kidney, except renal pelvis: Secondary | ICD-10-CM
